# Patient Record
Sex: Female | Born: 1985
Health system: Southern US, Community
[De-identification: ages and names within clinical notes are randomized; demographics above are authoritative.]

## PROBLEM LIST (undated history)

## (undated) ENCOUNTER — Inpatient Hospital Stay (HOSPITAL_COMMUNITY): Payer: Self-pay

## (undated) DIAGNOSIS — G43009 Migraine without aura, not intractable, without status migrainosus: Secondary | ICD-10-CM

## (undated) DIAGNOSIS — Z8489 Family history of other specified conditions: Secondary | ICD-10-CM

## (undated) DIAGNOSIS — F419 Anxiety disorder, unspecified: Secondary | ICD-10-CM

## (undated) DIAGNOSIS — I10 Essential (primary) hypertension: Secondary | ICD-10-CM

## (undated) DIAGNOSIS — K589 Irritable bowel syndrome without diarrhea: Secondary | ICD-10-CM

## (undated) DIAGNOSIS — K219 Gastro-esophageal reflux disease without esophagitis: Secondary | ICD-10-CM

## (undated) DIAGNOSIS — F319 Bipolar disorder, unspecified: Secondary | ICD-10-CM

## (undated) DIAGNOSIS — F32A Depression, unspecified: Secondary | ICD-10-CM

## (undated) HISTORY — DX: Depression, unspecified: F32.A

## (undated) HISTORY — PX: WISDOM TOOTH EXTRACTION: SHX21

## (undated) HISTORY — DX: Gastro-esophageal reflux disease without esophagitis: K21.9

## (undated) HISTORY — DX: Irritable bowel syndrome, unspecified: K58.9

## (undated) HISTORY — DX: Migraine without aura, not intractable, without status migrainosus: G43.009

## (undated) HISTORY — DX: Essential (primary) hypertension: I10

## (undated) HISTORY — PX: ABDOMINAL SURGERY: SHX537

---
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
## 2007-12-11 ENCOUNTER — Emergency Department (HOSPITAL_COMMUNITY): Admission: EM | Admit: 2007-12-11 | Discharge: 2007-12-11 | Payer: Self-pay | Admitting: Emergency Medicine

## 2008-02-22 ENCOUNTER — Emergency Department (HOSPITAL_COMMUNITY): Admission: EM | Admit: 2008-02-22 | Discharge: 2008-02-22 | Payer: Self-pay | Admitting: Emergency Medicine

## 2008-02-28 ENCOUNTER — Ambulatory Visit (HOSPITAL_COMMUNITY): Admission: RE | Admit: 2008-02-28 | Discharge: 2008-02-28 | Payer: Self-pay | Admitting: Family Medicine

## 2008-02-28 IMAGING — US US OB COMP LESS 14 WK
1 series · 14 of 28 positions shown · non-contrast
Comparison: none

CLINICAL DATA: Pelvic pain, positive pregnancy test

OBSTETRIC <14 WK US AND TRANSVAGINAL OB US
TECHNIQUE: Both transabdominal and transvaginal ultrasound
examinations were performed for complete evaluation of the
gestation as well as the maternal uterus, adnexal regions, and
pelvic cul-de-sac.

[Series 1: us ob comp less 14 wk · 0.22mm/px · 14 of 49 slices shown]
[im 2/49]
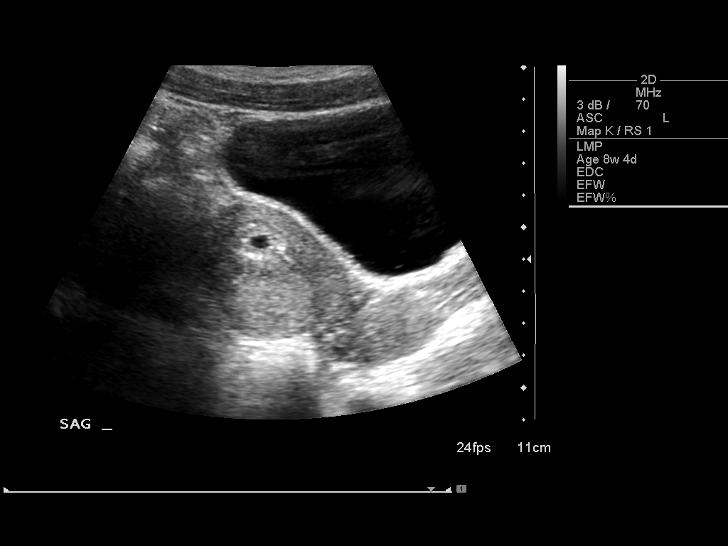
[im 6/49]
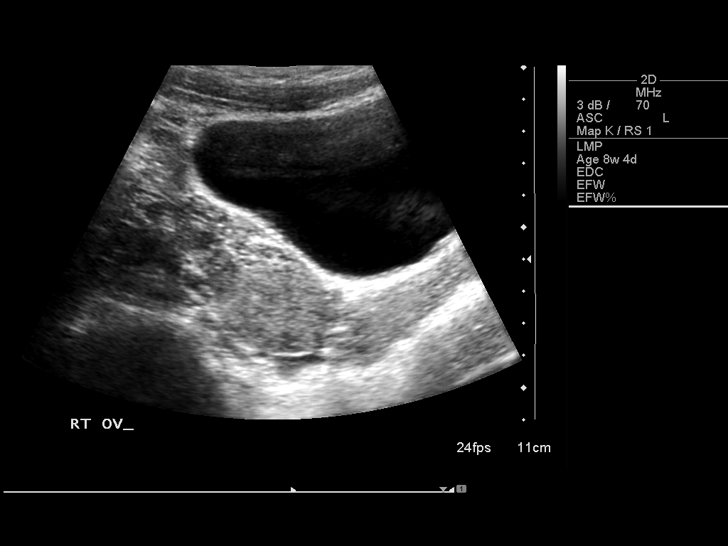
[im 9/49]
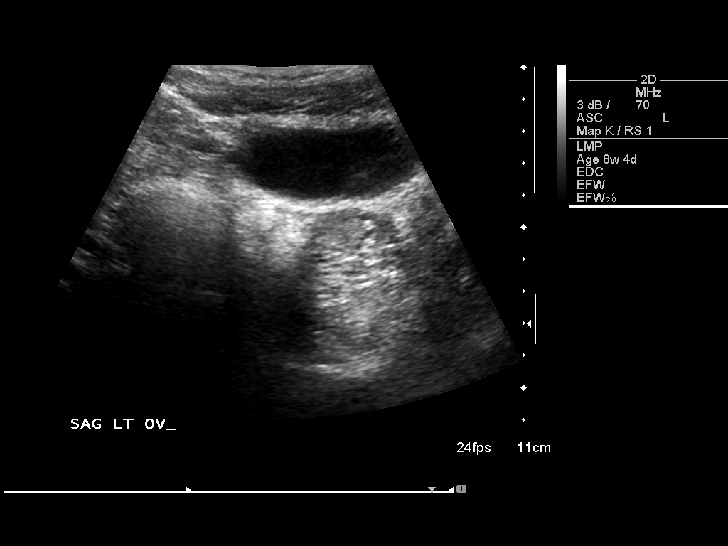
[im 13/49]
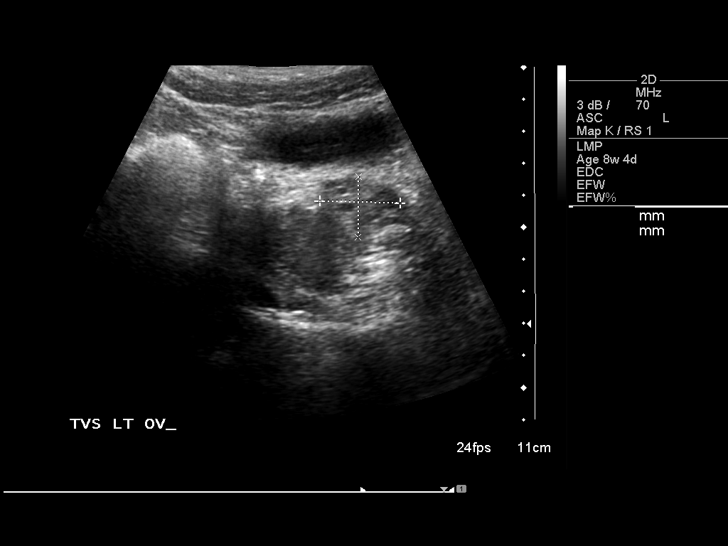
[im 17/49]
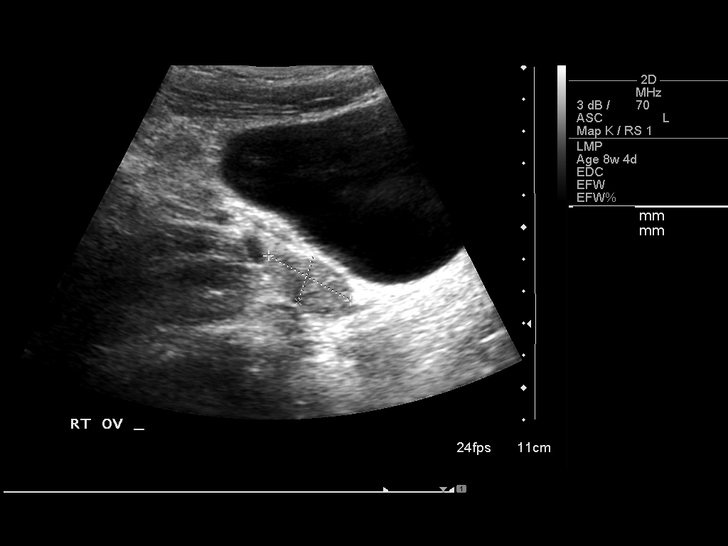
[im 20/49]
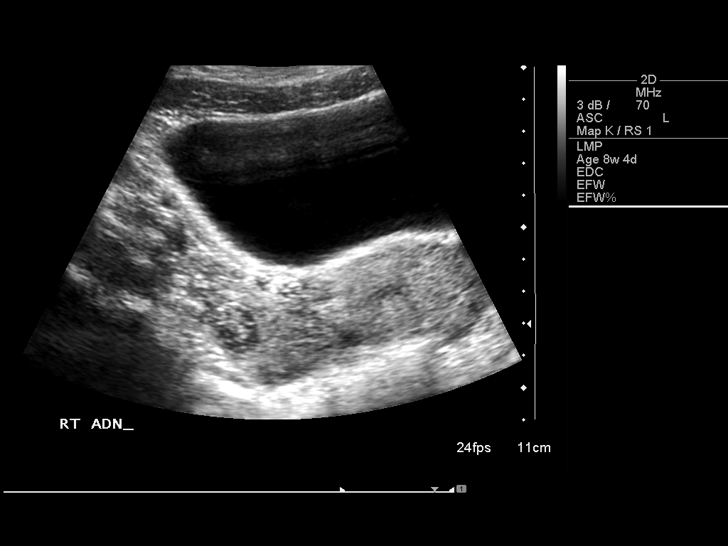
[im 24/49]
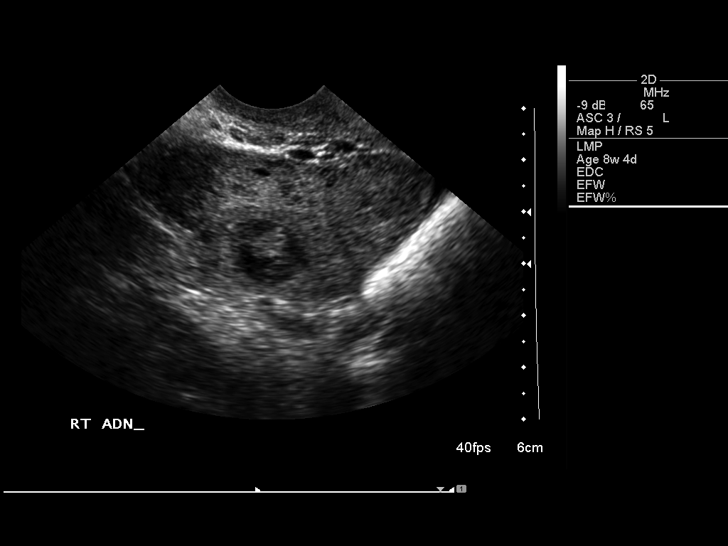
[im 27/49]
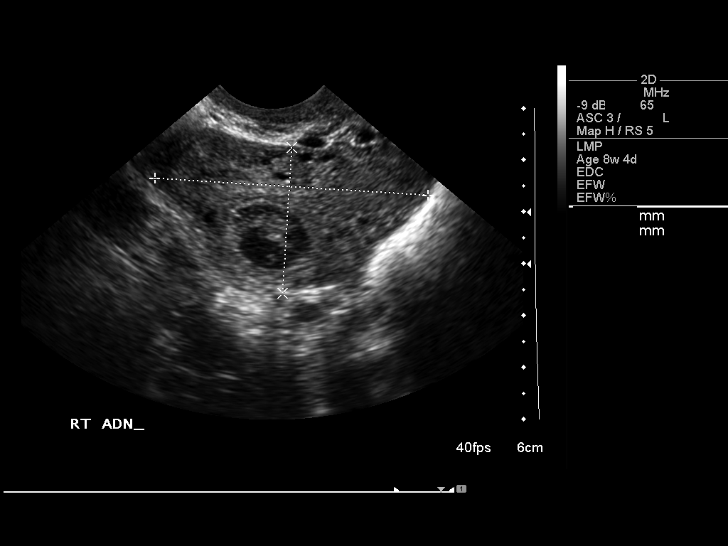
[im 31/49]
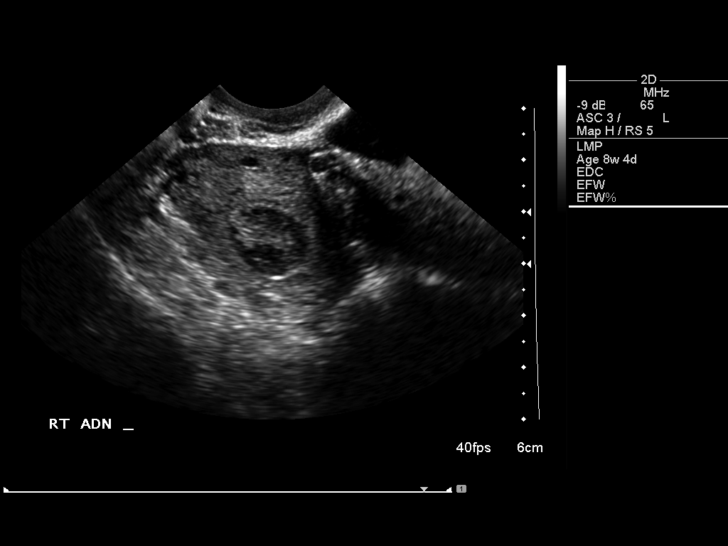
[im 34/49]
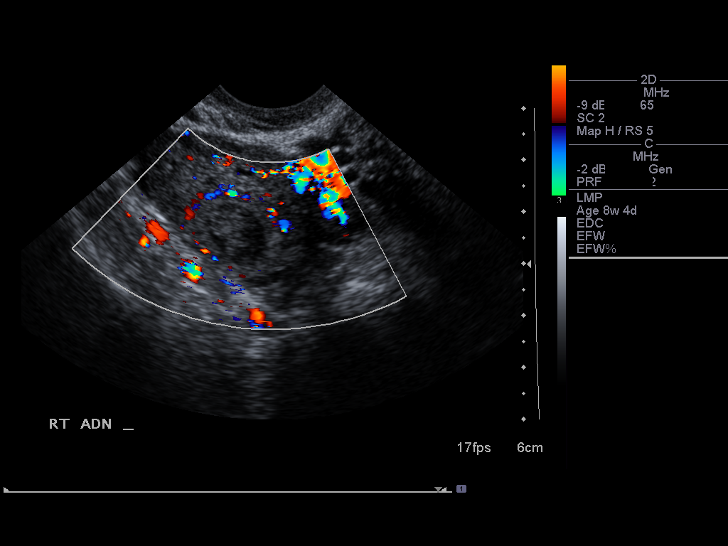
[im 38/49]
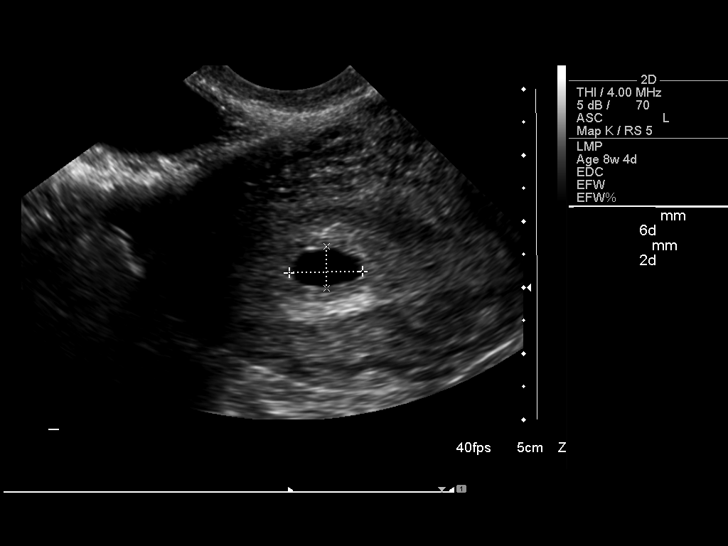
[im 41/49]
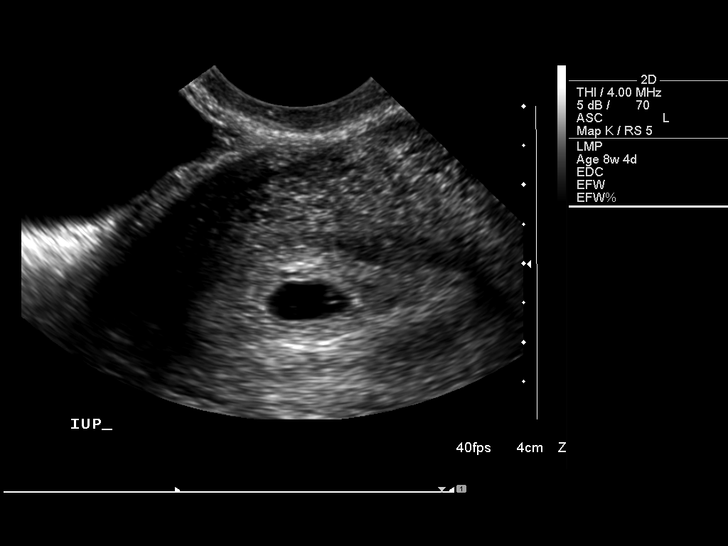
[im 45/49]
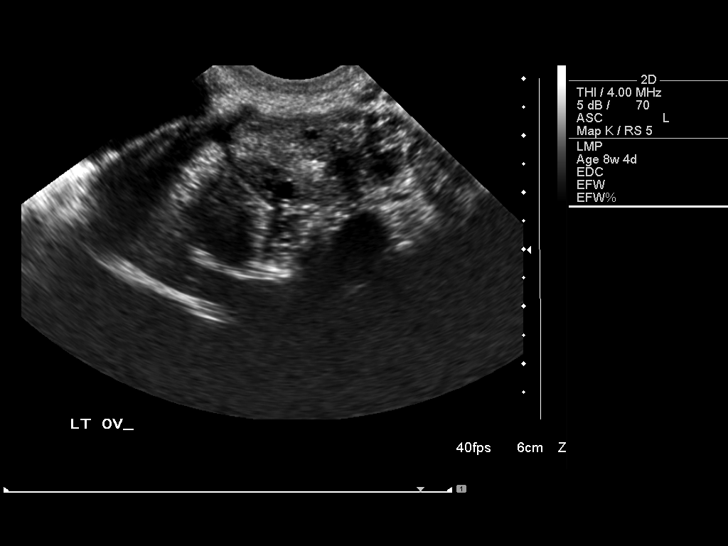
[im 49/49]
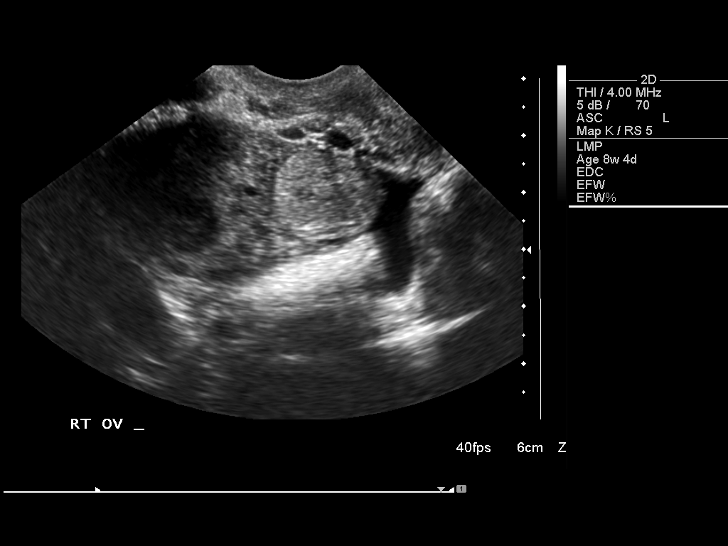

[14 of 28 positions shown; findings below may reference images not displayed]

Well-distended normal-appearing bladder.
Gestational sac within uterus, containing a yolk sac.
No subchorionic hemorrhage.

Intrauterine gestational sac: Present
Yolk sac: Present
Embryo: Not visualized
Cardiac Activity: Not visualized
Heart Rate: n/a bpm

MSD: 9   mm  5    w 4    d
CRL:               w     d          US EDC:

Maternal uterus/adnexae:
Small amount of free fluid in right adnexa.
Left ovary normal size and morphology, 2.0 x 3.2 x 1.7 cm.
Right ovary measures 5.3 x 2.8 x 2.7 cm.
Hemorrhagic corpus luteum right ovary.
No other adnexal masses.
IMPRESSION: Gestational sac within uterus containing a yolk sac.
No evidence of ectopic pregnancy.
Assessment of fetal viability may be made on follow-up ultrasound
in 10-14 days.

## 2008-03-14 ENCOUNTER — Emergency Department (HOSPITAL_COMMUNITY): Admission: EM | Admit: 2008-03-14 | Discharge: 2008-03-15 | Payer: Self-pay | Admitting: Emergency Medicine

## 2008-09-28 DIAGNOSIS — O1490 Unspecified pre-eclampsia, unspecified trimester: Secondary | ICD-10-CM

## 2008-12-30 ENCOUNTER — Emergency Department (HOSPITAL_COMMUNITY): Admission: EM | Admit: 2008-12-30 | Discharge: 2008-12-30 | Payer: Self-pay | Admitting: Emergency Medicine

## 2008-12-30 IMAGING — CR DG CHEST 2V
2 series · 2 of 2 positions shown · non-contrast
Comparison: None available.

CLINICAL DATA: Chest pain.

CHEST - 2 VIEW

[view not recorded (1 of 2)]
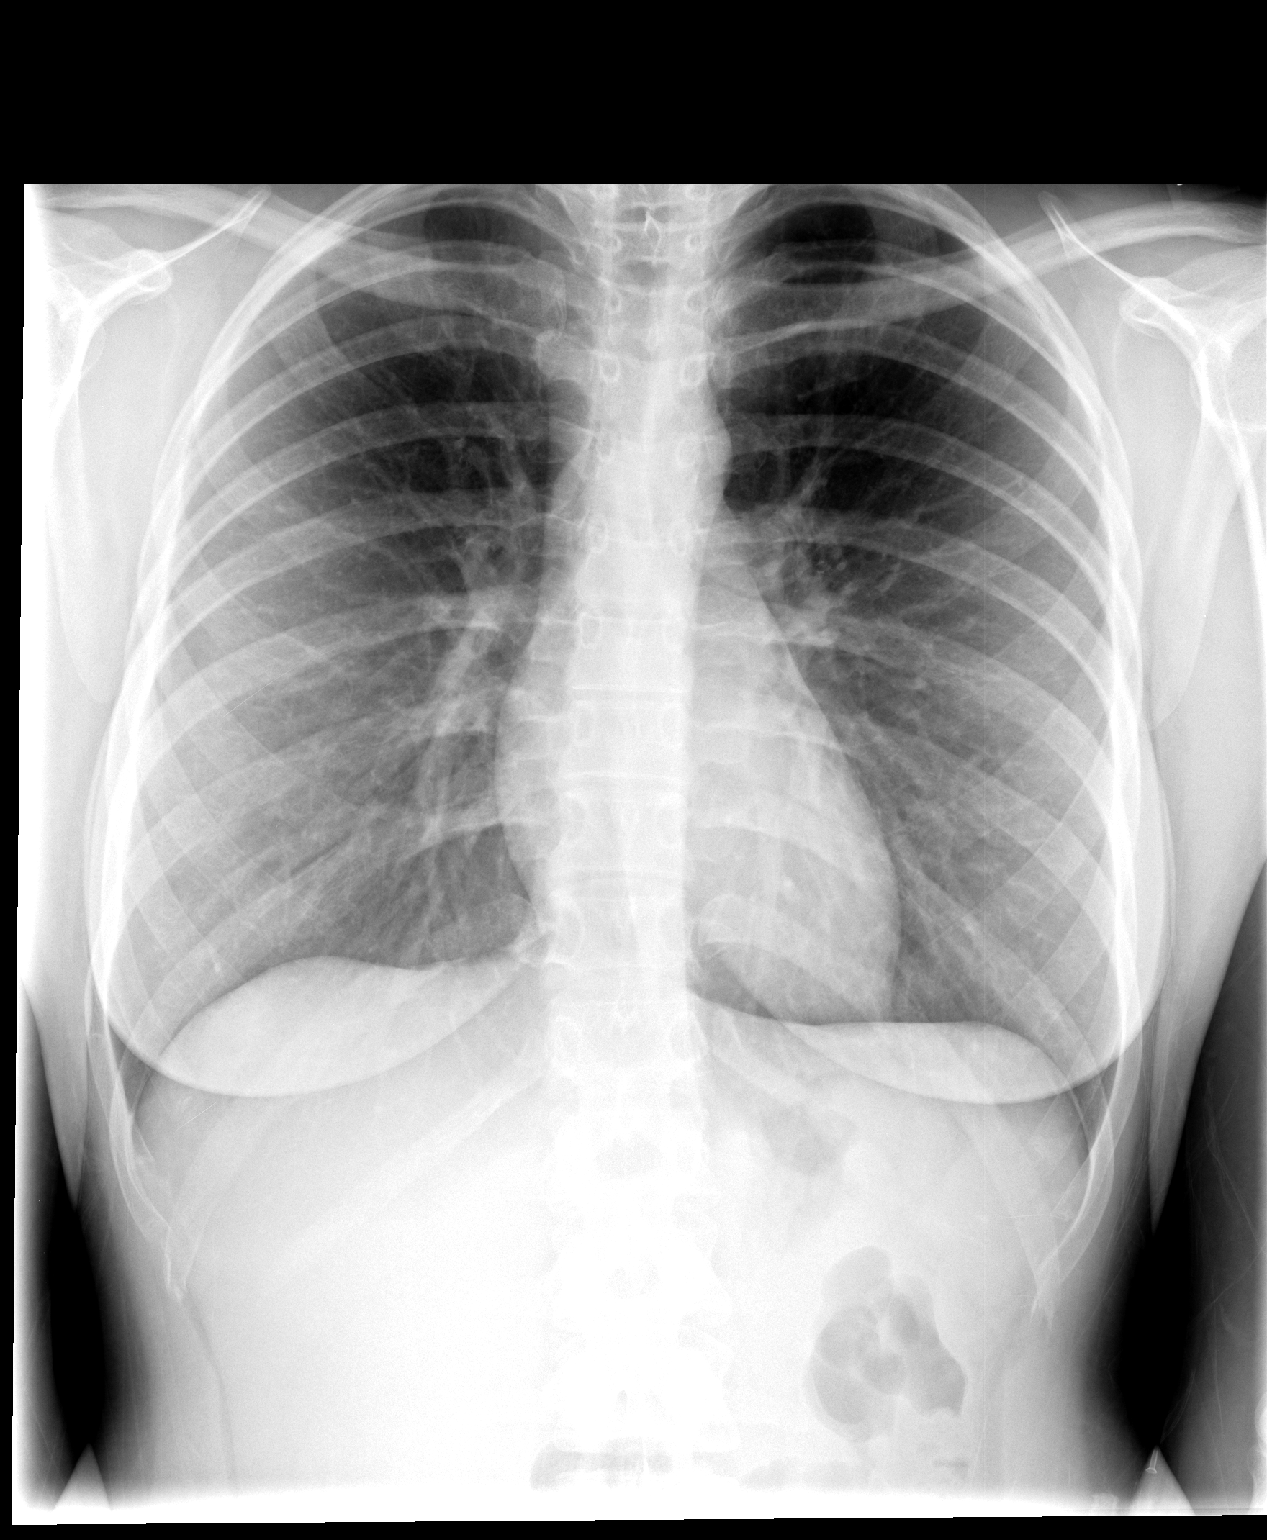

[view not recorded (2 of 2)]
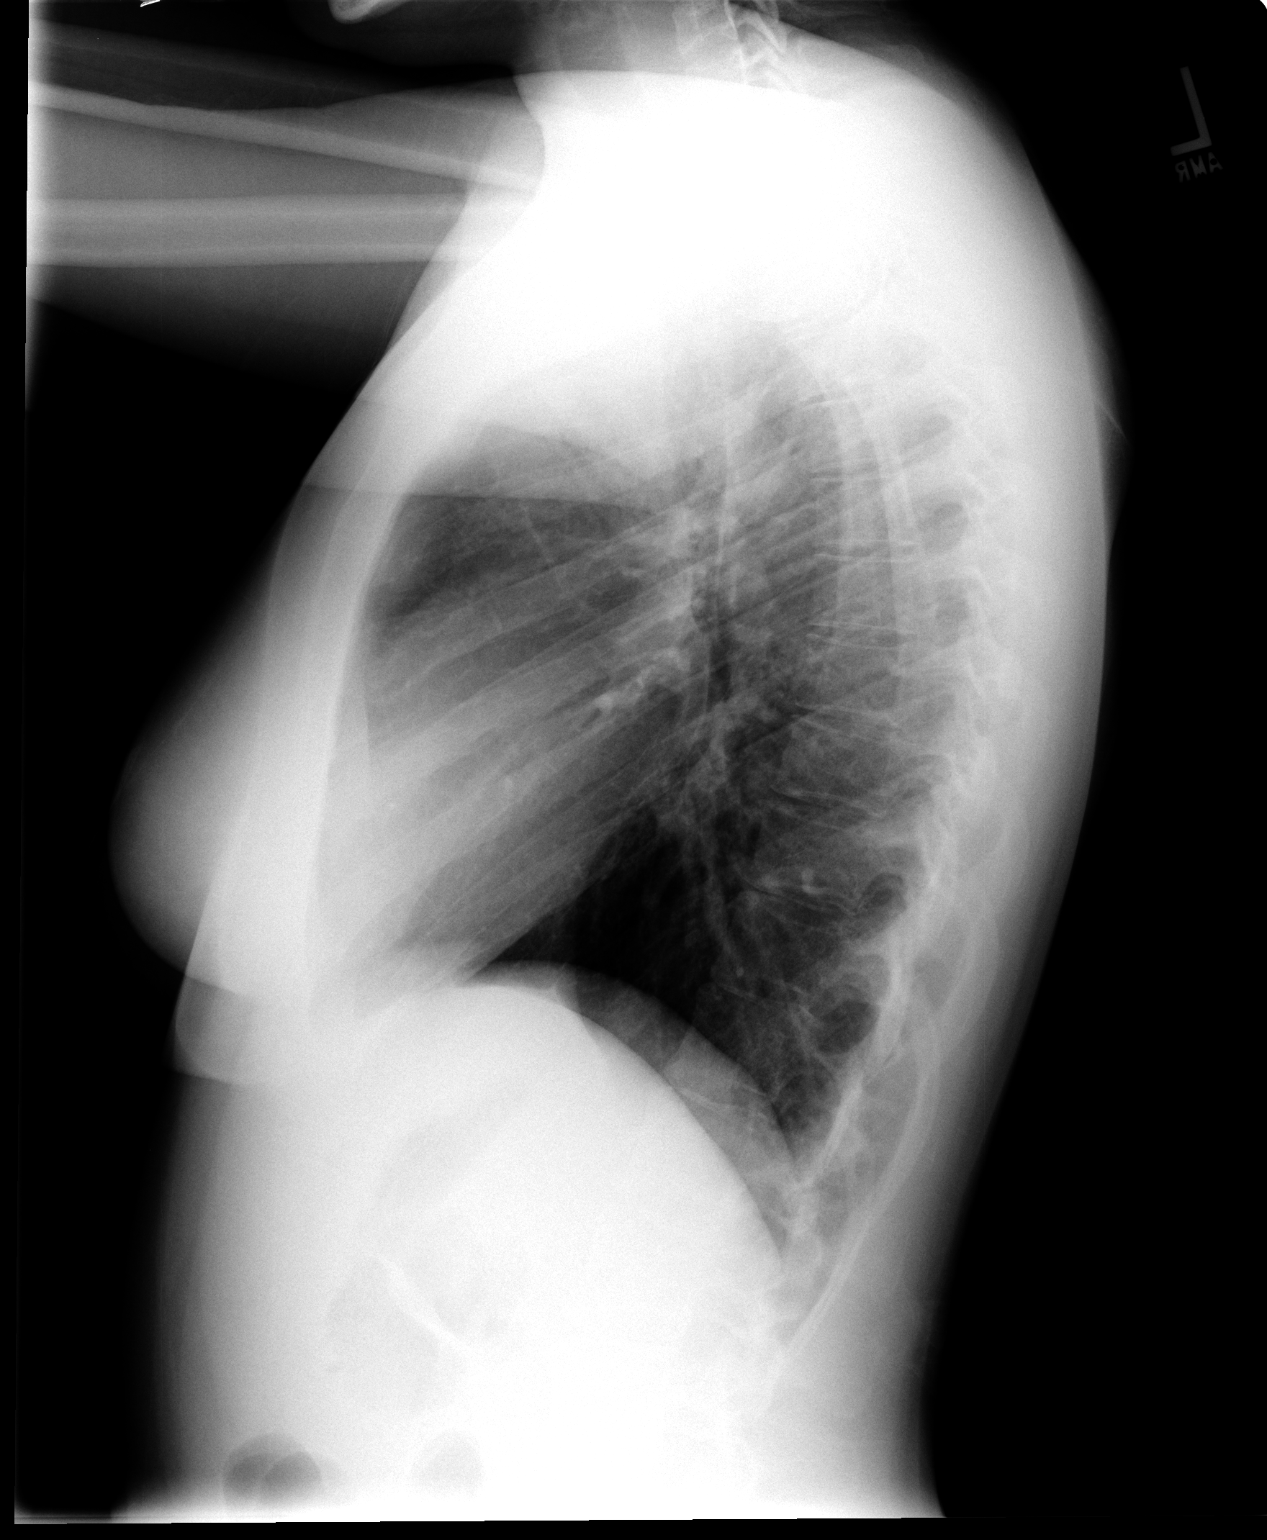

[2 of 2 positions shown; findings below may reference images not displayed]

FINDINGS: Lungs clear.  No pneumothorax or pleural effusion.  Heart
size normal.
IMPRESSION: Normal chest.

## 2011-03-01 LAB — BASIC METABOLIC PANEL
BUN: 10 mg/dL (ref 6–23)
CO2: 25 mEq/L (ref 19–32)
Calcium: 9.3 mg/dL (ref 8.4–10.5)
Chloride: 106 mEq/L (ref 96–112)
Creatinine, Ser: 0.63 mg/dL (ref 0.4–1.2)
GFR calc Af Amer: >60 mL/min (ref >60)
GFR calc non Af Amer: >60 mL/min (ref >60)
Glucose, Bld: 93 mg/dL (ref 70–99)
Potassium: 4 mEq/L (ref 3.5–5.1)
Sodium: 139 mEq/L (ref 135–145)

## 2011-03-01 LAB — DIFFERENTIAL
Basophils Absolute: 0 10*3/uL (ref 0.0–0.1)
Basophils Relative: 1 % (ref 0–1)
Eosinophils Absolute: 0.2 10*3/uL (ref 0.0–0.7)
Eosinophils Relative: 2 % (ref 0–5)
Lymphocytes Relative: 35 % (ref 12–46)
Lymphs Abs: 2.3 10*3/uL (ref 0.7–4.0)
Monocytes Absolute: 0.6 10*3/uL (ref 0.1–1.0)
Monocytes Relative: 8 % (ref 3–12)
Neutro Abs: 3.6 10*3/uL (ref 1.7–7.7)
Neutrophils Relative %: 54 % (ref 43–77)

## 2011-03-01 LAB — CBC
HCT: 38.7 % (ref 36.0–46.0)
Hemoglobin: 13.3 g/dL (ref 12.0–15.0)
MCHC: 34.4 g/dL (ref 30.0–36.0)
MCV: 88 fL (ref 78.0–100.0)
Platelets: 242 10*3/uL (ref 150–400)
RBC: 4.4 MIL/uL (ref 3.87–5.11)
RDW: 12.8 % (ref 11.5–15.5)
WBC: 6.7 10*3/uL (ref 4.0–10.5)

## 2011-03-01 LAB — D-DIMER, QUANTITATIVE: D-Dimer, Quant: 0.22 ug/mL-FEU (ref 0.00–0.48)

## 2011-03-01 LAB — POCT CARDIAC MARKERS
CKMB, poc: <1 ng/mL — ABNORMAL LOW (ref 1.0–8.0)
Myoglobin, poc: 46.4 ng/mL (ref 12–200)
Troponin i, poc: <0.05 ng/mL (ref 0.00–0.09)

## 2011-08-04 LAB — BASIC METABOLIC PANEL
BUN: 7
CO2: 25
Calcium: 9.3
Chloride: 109
Creatinine, Ser: 0.64
GFR calc Af Amer: >60
GFR calc non Af Amer: >60
Glucose, Bld: 92
Potassium: 4
Sodium: 143

## 2011-08-04 LAB — URINALYSIS, ROUTINE W REFLEX MICROSCOPIC
Bilirubin Urine: NEGATIVE
Glucose, UA: NEGATIVE
Hgb urine dipstick: NEGATIVE
Ketones, ur: NEGATIVE
Nitrite: NEGATIVE
Protein, ur: NEGATIVE
Specific Gravity, Urine: 1.025
Urobilinogen, UA: 0.2
pH: 5.5

## 2011-08-04 LAB — DIFFERENTIAL
Basophils Absolute: 0
Basophils Relative: 1
Eosinophils Absolute: 0.2
Eosinophils Relative: 2
Lymphocytes Relative: 25
Lymphs Abs: 2.1
Monocytes Absolute: 0.7
Monocytes Relative: 8
Neutro Abs: 5.5
Neutrophils Relative %: 65

## 2011-08-04 LAB — CBC
HCT: 39.1
Hemoglobin: 13.8
MCHC: 35.2
MCV: 88.2
Platelets: 290
RBC: 4.43
RDW: 12.6
WBC: 8.6

## 2011-08-04 LAB — HEPATIC FUNCTION PANEL
ALT: 15
AST: 18
Albumin: 4.2
Alkaline Phosphatase: 70
Bilirubin, Direct: 0.1
Indirect Bilirubin: 0.6
Total Bilirubin: 0.7
Total Protein: 6.8

## 2011-08-04 LAB — PREGNANCY, URINE: Preg Test, Ur: NEGATIVE

## 2011-10-01 ENCOUNTER — Emergency Department (HOSPITAL_COMMUNITY)
Admission: EM | Admit: 2011-10-01 | Discharge: 2011-10-01 | Disposition: A | Discharge status: Home / Self Care | Payer: 59 | Attending: Emergency Medicine | Admitting: Emergency Medicine

## 2011-10-01 DIAGNOSIS — N730 Acute parametritis and pelvic cellulitis: Secondary | ICD-10-CM

## 2011-10-01 DIAGNOSIS — F17200 Nicotine dependence, unspecified, uncomplicated: Secondary | ICD-10-CM | POA: Insufficient documentation

## 2011-10-01 DIAGNOSIS — F411 Generalized anxiety disorder: Secondary | ICD-10-CM | POA: Insufficient documentation

## 2011-10-01 HISTORY — DX: Anxiety disorder, unspecified: F41.9

## 2011-10-01 LAB — WET PREP, GENITAL
Trich, Wet Prep: NONE SEEN
Yeast Wet Prep HPF POC: NONE SEEN

## 2011-10-01 LAB — URINALYSIS, ROUTINE W REFLEX MICROSCOPIC
Bilirubin Urine: NEGATIVE
Glucose, UA: NEGATIVE mg/dL
Hgb urine dipstick: NEGATIVE
Ketones, ur: NEGATIVE mg/dL
Leukocytes, UA: NEGATIVE
Nitrite: NEGATIVE
Protein, ur: NEGATIVE mg/dL
Specific Gravity, Urine: 1.02 (ref 1.005–1.030)
Urobilinogen, UA: 0.2 mg/dL (ref 0.0–1.0)
pH: 8.5 — ABNORMAL HIGH (ref 5.0–8.0)

## 2011-10-01 LAB — BASIC METABOLIC PANEL
BUN: 9 mg/dL (ref 6–23)
CO2: 28 mEq/L (ref 19–32)
Calcium: 9.5 mg/dL (ref 8.4–10.5)
Chloride: 102 mEq/L (ref 96–112)
Creatinine, Ser: 0.58 mg/dL (ref 0.50–1.10)
GFR calc Af Amer: >90 mL/min (ref >90)
GFR calc non Af Amer: >90 mL/min (ref >90)
Glucose, Bld: 89 mg/dL (ref 70–99)
Potassium: 4 mEq/L (ref 3.5–5.1)
Sodium: 136 mEq/L (ref 135–145)

## 2011-10-01 LAB — CBC
HCT: 38.3 % (ref 36.0–46.0)
Hemoglobin: 12.8 g/dL (ref 12.0–15.0)
MCH: 30.5 pg (ref 26.0–34.0)
MCHC: 33.4 g/dL (ref 30.0–36.0)
MCV: 91.4 fL (ref 78.0–100.0)
Platelets: 192 10*3/uL (ref 150–400)
RBC: 4.19 MIL/uL (ref 3.87–5.11)
RDW: 13.4 % (ref 11.5–15.5)
WBC: 13.4 10*3/uL — ABNORMAL HIGH (ref 4.0–10.5)

## 2011-10-01 LAB — POCT PREGNANCY, URINE: Preg Test, Ur: NEGATIVE

## 2011-10-01 LAB — RPR: RPR Ser Ql: NONREACTIVE

## 2011-10-01 LAB — OCCULT BLOOD, POC DEVICE: Fecal Occult Bld: NEGATIVE

## 2011-10-01 MED ORDER — METRONIDAZOLE 500 MG PO TABS: ORAL_TABLET | ORAL | Status: DC

## 2011-10-01 MED ORDER — MORPHINE SULFATE 4 MG/ML IJ SOLN
4.0000 mg | Freq: Once | INTRAMUSCULAR | Status: AC
  Administered 2011-10-01: 4 mg via INTRAVENOUS
  Filled 2011-10-01: qty 1

## 2011-10-01 MED ORDER — AZITHROMYCIN 250 MG PO TABS
1000.0000 mg | ORAL_TABLET | Freq: Once | ORAL | Status: AC
  Administered 2011-10-01: 1000 mg via ORAL
  Filled 2011-10-01: qty 4

## 2011-10-01 MED ORDER — DEXTROSE 5 % IV SOLN
1.0000 g | Freq: Once | INTRAVENOUS | Status: AC
  Administered 2011-10-01: 1 g via INTRAVENOUS
  Filled 2011-10-01: qty 10

## 2011-10-01 MED ORDER — PROMETHAZINE HCL 25 MG PO TABS: 25.0000 mg | ORAL_TABLET | Freq: Four times a day (QID) | ORAL | Status: DC | PRN

## 2011-10-01 MED ORDER — ONDANSETRON HCL 4 MG/2ML IJ SOLN
4.0000 mg | Freq: Once | INTRAMUSCULAR | Status: AC
  Administered 2011-10-01: 4 mg via INTRAVENOUS
  Filled 2011-10-01: qty 2

## 2011-10-01 MED ORDER — HYDROCODONE-ACETAMINOPHEN 5-325 MG PO TABS: 1.0000 | ORAL_TABLET | ORAL | Status: AC | PRN

## 2011-10-01 NOTE — ED Notes (Signed)
Patient with no complaints at this time. Respirations even and unlabored. Skin warm/dry. Discharge instructions reviewed with patient at this time. Patient given opportunity to voice concerns/ask questions. IV removed per policy and band-aid applied to site. Patient discharged at this time and left Emergency Department with steady gait.  

## 2011-10-01 NOTE — ED Notes (Signed)
Pt reports 2 days ago noticed some bloody vaginal discharge.  Last night c/o lower abd pain and abd swelling.  LMP was 2 weeks ago and was normal per pt.  Denies any pain with urination.

## 2011-10-01 NOTE — ED Provider Notes (Signed)
History     CSN: [id number] Arrival date & time: 10/01/2011  9:43 AM   None     Chief Complaint  Patient presents with  . Pelvic Pain    (Consider location/radiation/quality/duration/timing/severity/associated sxs/prior treatment) Patient is a 25 y.o. female presenting with vaginal discharge. The history is provided by the patient.  Vaginal Discharge This is a new problem. The current episode started in the past 7 days. The problem occurs rarely. The problem has been unchanged. Associated symptoms include abdominal pain. Pertinent negatives include no anorexia, arthralgias, chest pain, chills, coughing, nausea, neck pain, urinary symptoms or vomiting. The symptoms are aggravated by nothing. She has tried nothing for the symptoms. The treatment provided no relief.    Past Medical History  Diagnosis Date  . Anxiety     Past Surgical History  Procedure Date  . Abdominal surgery     No family history on file.  History  Substance Use Topics  . Smoking status: Current Everyday Smoker  . Smokeless tobacco: Not on file  . Alcohol Use: Yes     occasionally    OB History    Grav Para Term Preterm Abortions TAB SAB Ect Mult Living                  Review of Systems  Constitutional: Negative for chills and activity change.       All ROS Neg except as noted in HPI  HENT: Negative for nosebleeds and neck pain.   Eyes: Negative for photophobia and discharge.  Respiratory: Negative for cough, shortness of breath and wheezing.   Cardiovascular: Negative for chest pain and palpitations.  Gastrointestinal: Positive for abdominal pain. Negative for nausea, vomiting, blood in stool and anorexia.  Genitourinary: Positive for vaginal discharge. Negative for dysuria, frequency and hematuria.  Musculoskeletal: Negative for back pain and arthralgias.  Skin: Negative.   Neurological: Negative for dizziness, seizures and speech difficulty.  Psychiatric/Behavioral: Negative for  hallucinations and confusion.    Allergies  Klonopin and Lexapro  Home Medications  No current outpatient prescriptions on file.  BP 109/65  Pulse 112  Temp(Src) 98.3 F (36.8 C) (Oral)  Resp 20  Ht 5\' 1"  (1.549 m)  Wt 122 lb (55.339 kg)  BMI 23.05 kg/m2  SpO2 100%  LMP 09/17/2011  Physical Exam  Nursing note and vitals reviewed. Constitutional: She is oriented to person, place, and time. She appears well-developed and well-nourished.  Non-toxic appearance.  HENT:  Head: Normocephalic.  Right Ear: Tympanic membrane and external ear normal.  Left Ear: Tympanic membrane and external ear normal.  Eyes: EOM and lids are normal. Pupils are equal, round, and reactive to light.  Neck: Normal range of motion. Neck supple. Carotid bruit is not present.  Cardiovascular: Regular rhythm, normal heart sounds, intact distal pulses and normal pulses.  Tachycardia present.   Pulmonary/Chest: Breath sounds normal. No respiratory distress.  Abdominal: Soft. Bowel sounds are normal. There is tenderness.         Right and left lower quad abd pain. Mild pain with walking. Increase pain with change of positions.  Genitourinary: Vaginal discharge found.       External genitalia wnl. No mass or fb in the vaginal vault. Mild blood tinged mucus in the vaginal vault.  Mod wall motion tenderness noted. No adnexal mass. Chaperone present during procedure.  Musculoskeletal: Normal range of motion.  Lymphadenopathy:       Head (right side): No submandibular adenopathy present.  Head (left side): No submandibular adenopathy present.    She has no cervical adenopathy.  Neurological: She is alert and oriented to person, place, and time. She has normal strength. No cranial nerve deficit or sensory deficit.  Skin: Skin is warm and dry.  Psychiatric: She has a normal mood and affect. Her speech is normal.    ED Course  Procedures (including critical care time)   Labs Reviewed  URINALYSIS, ROUTINE  W REFLEX MICROSCOPIC  POCT PREGNANCY, URINE  CBC  BASIC METABOLIC PANEL   No results found.   No diagnosis found.    MDM  I have reviewed nursing notes, vital signs, and all appropriate lab and imaging results for this patient.  I have reviewed nursing notes, vital signs, and all appropriate lab and imaging results for this patient. Results for orders placed during the hospital encounter of 10/01/11  URINALYSIS, ROUTINE W REFLEX MICROSCOPIC      Component Value Range   Color, Urine YELLOW  YELLOW    APPearance CLEAR  CLEAR    Specific Gravity, Urine 1.020  1.005 - 1.030    pH 8.5 (*) 5.0 - 8.0    Glucose, UA NEGATIVE  NEGATIVE (mg/dL)   Hgb urine dipstick NEGATIVE  NEGATIVE    Bilirubin Urine NEGATIVE  NEGATIVE    Ketones, ur NEGATIVE  NEGATIVE (mg/dL)   Protein, ur NEGATIVE  NEGATIVE (mg/dL)   Urobilinogen, UA 0.2  0.0 - 1.0 (mg/dL)   Nitrite NEGATIVE  NEGATIVE    Leukocytes, UA NEGATIVE  NEGATIVE   CBC      Component Value Range   WBC 13.4 (*) 4.0 - 10.5 (K/uL)   RBC 4.19  3.87 - 5.11 (MIL/uL)   Hemoglobin 12.8  12.0 - 15.0 (g/dL)   HCT 91.4  78.2 - 95.6 (%)   MCV 91.4  78.0 - 100.0 (fL)   MCH 30.5  26.0 - 34.0 (pg)   MCHC 33.4  30.0 - 36.0 (g/dL)   RDW 21.3  08.6 - 57.8 (%)   Platelets 192  150 - 400 (K/uL)  BASIC METABOLIC PANEL      Component Value Range   Sodium 136  135 - 145 (mEq/L)   Potassium 4.0  3.5 - 5.1 (mEq/L)   Chloride 102  96 - 112 (mEq/L)   CO2 28  19 - 32 (mEq/L)   Glucose, Bld 89  70 - 99 (mg/dL)   BUN 9  6 - 23 (mg/dL)   Creatinine, Ser 4.69  0.50 - 1.10 (mg/dL)   Calcium 9.5  8.4 - 62.9 (mg/dL)   GFR calc non Af Amer >90  >90 (mL/min)   GFR calc Af Amer >90  >90 (mL/min)  POCT PREGNANCY, URINE      Component Value Range   Preg Test, Ur NEGATIVE    WET PREP, GENITAL      Component Value Range   Yeast, Wet Prep NONE SEEN  NONE SEEN    Trich, Wet Prep NONE SEEN  NONE SEEN    Clue Cells, Wet Prep MANY (*) NONE SEEN    WBC, Wet Prep HPF  POC MANY (*) NONE SEEN   GC/CHLAMYDIA PROBE AMP, GENITAL      Component Value Range   GC Probe Amp, Genital NEGATIVE  NEGATIVE    Chlamydia, DNA Probe NEGATIVE  NEGATIVE   RPR      Component Value Range   RPR NON REACTIVE  NON REACTIVE   OCCULT BLOOD, POC DEVICE  Component Value Range   Fecal Occult Bld NEGATIVE     No results found.      Kathie Dike, [place] 10/14/11 [id number]

## 2011-10-03 LAB — GC/CHLAMYDIA PROBE AMP, GENITAL
Chlamydia, DNA Probe: NEGATIVE
GC Probe Amp, Genital: NEGATIVE

## 2011-10-15 NOTE — ED Provider Notes (Signed)
Medical screening examination/treatment/procedure(s) were performed by non-physician practitioner and as supervising physician I was immediately available for consultation/collaboration.  Shelda Jakes, MD 10/15/11 [phone]

## 2011-12-23 ENCOUNTER — Ambulatory Visit (HOSPITAL_COMMUNITY)
Admission: RE | Admit: 2011-12-23 | Discharge: 2011-12-23 | Disposition: A | Discharge status: Home / Self Care | Payer: 59 | Source: Ambulatory Visit | Attending: Family Medicine | Admitting: Family Medicine

## 2011-12-23 ENCOUNTER — Other Ambulatory Visit: Payer: Self-pay | Admitting: Family Medicine

## 2011-12-23 DIAGNOSIS — IMO0002 Reserved for concepts with insufficient information to code with codable children: Secondary | ICD-10-CM | POA: Insufficient documentation

## 2011-12-23 DIAGNOSIS — R51 Headache: Secondary | ICD-10-CM | POA: Insufficient documentation

## 2011-12-23 DIAGNOSIS — S060X0A Concussion without loss of consciousness, initial encounter: Secondary | ICD-10-CM | POA: Insufficient documentation

## 2011-12-23 IMAGING — CT CT HEAD W/O CM
1 series · 16 of 30 positions shown, 20 images · non-contrast
Comparison: None

CLINICAL DATA: Fell 3 weeks ago and struck left supraorbital
region, headaches

CT HEAD WITHOUT CONTRAST
TECHNIQUE: Contiguous axial images were obtained from the base of
the skull through the vertex without contrast.

[Series 2: headtrauma 4.8 h37s · axial · 0.43mm/px · z∈[+82,+214]mm · 16 of 30 slices shown, 20 images]
[im 2/30  brain]
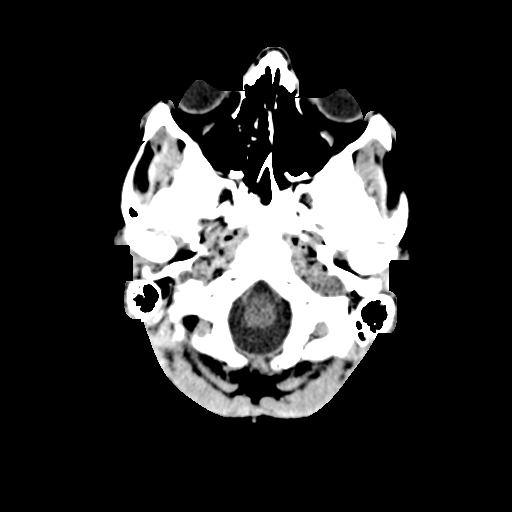
[im 2/30  bone]
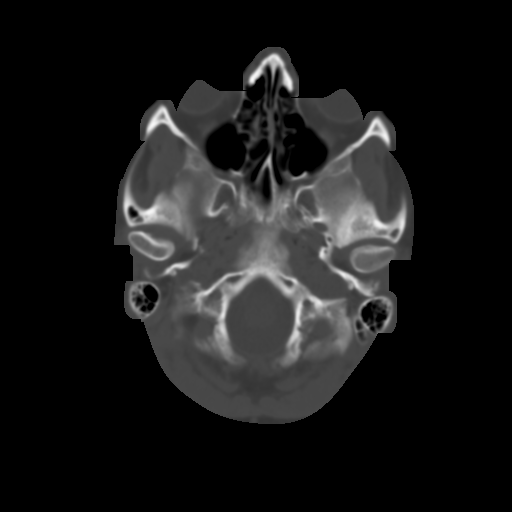
[im 4/30  brain]
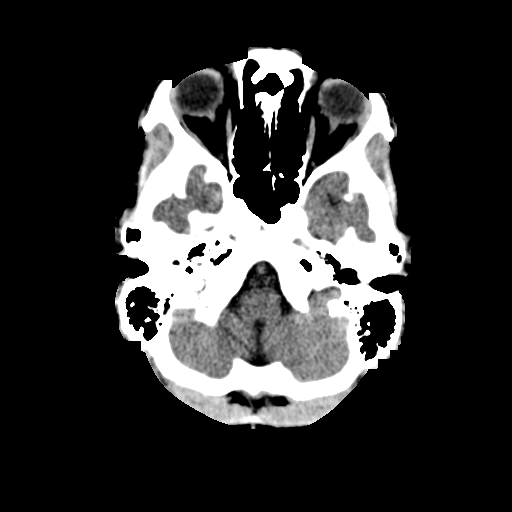
[im 6/30  brain]
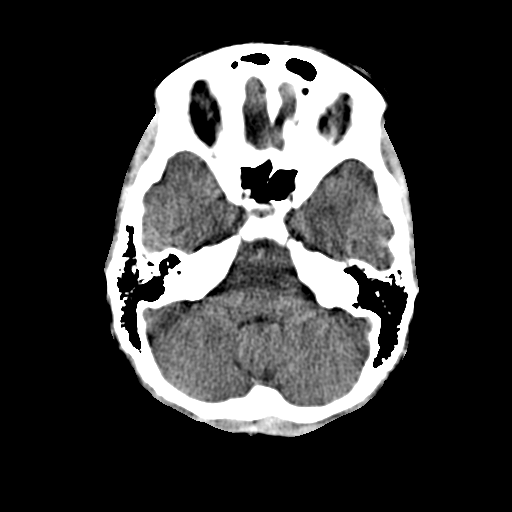
[im 8/30  brain]
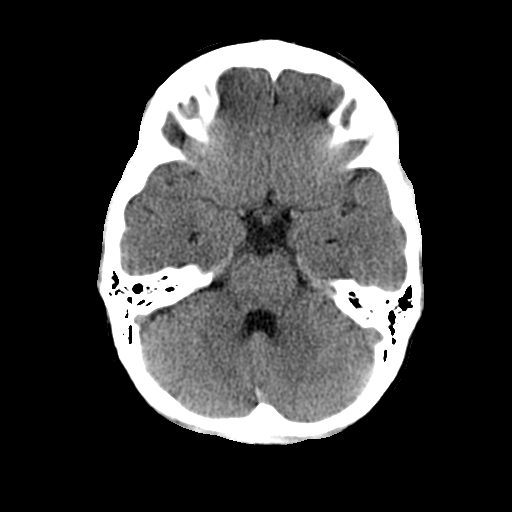
[im 9/30  brain]
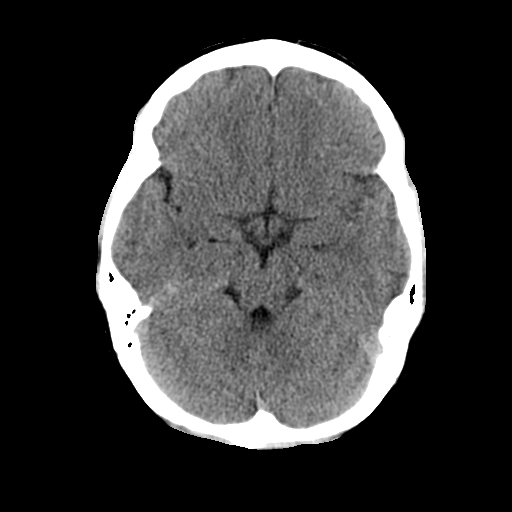
[im 9/30  bone]
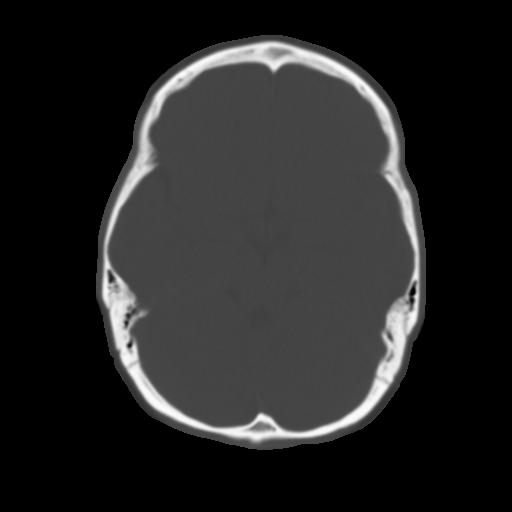
[im 11/30  brain]
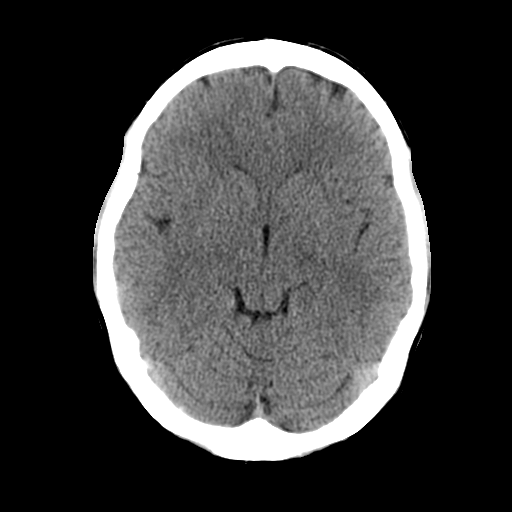
[im 13/30  brain]
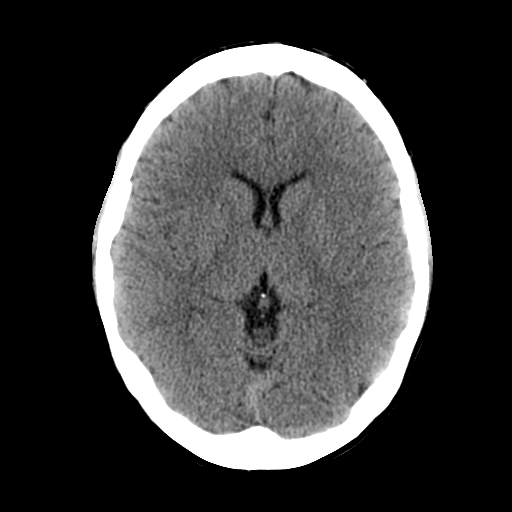
[im 15/30  brain]
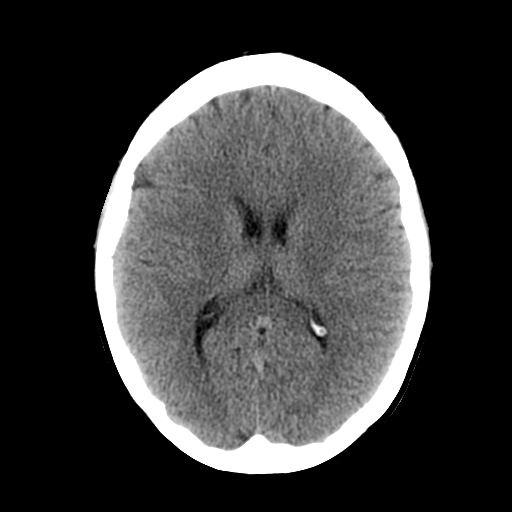
[im 16/30  brain]
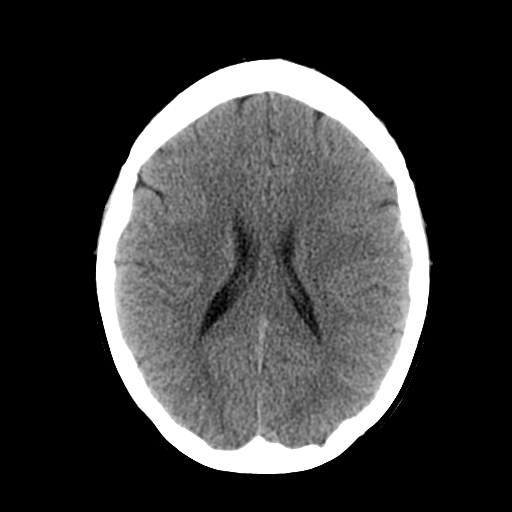
[im 16/30  bone]
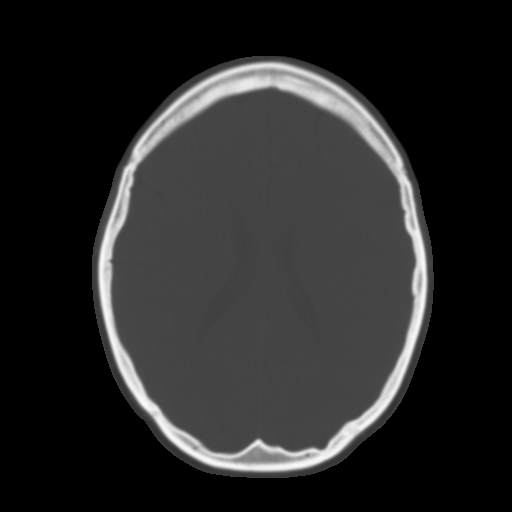
[im 18/30  brain]
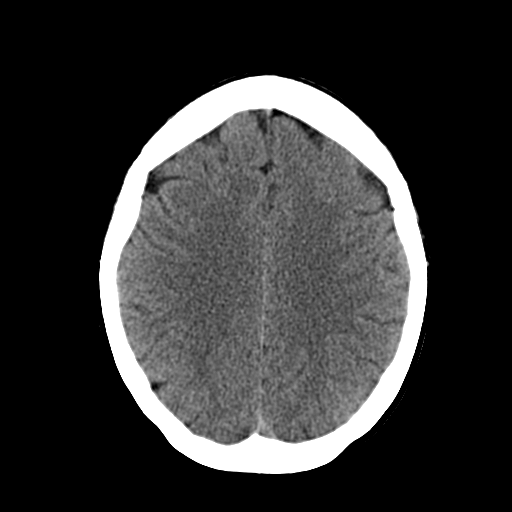
[im 20/30  brain]
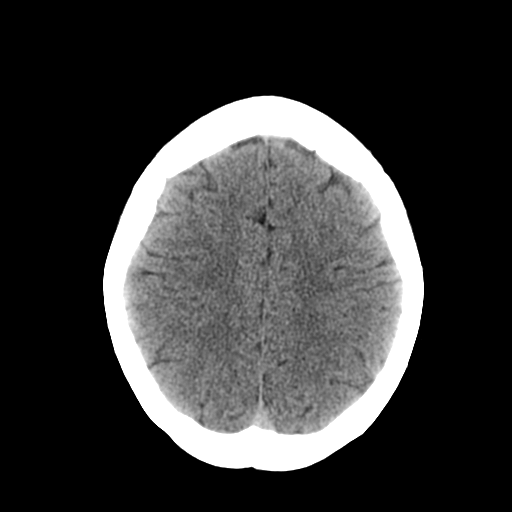
[im 22/30  brain]
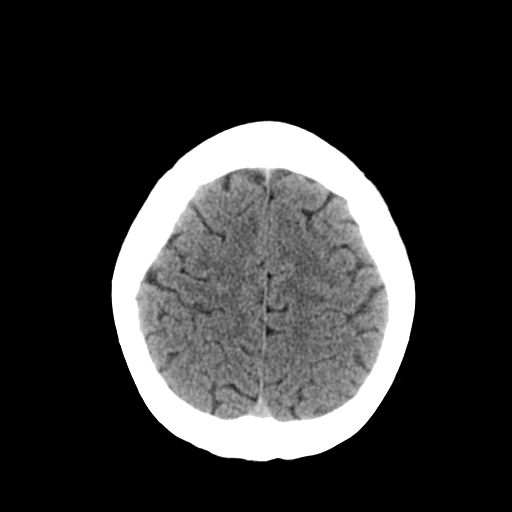
[im 23/30  brain]
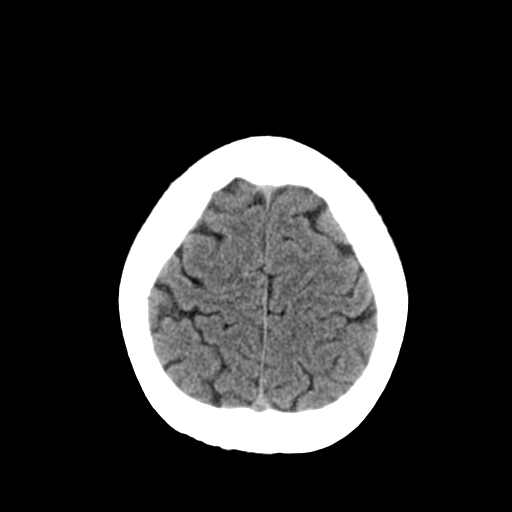
[im 23/30  bone]
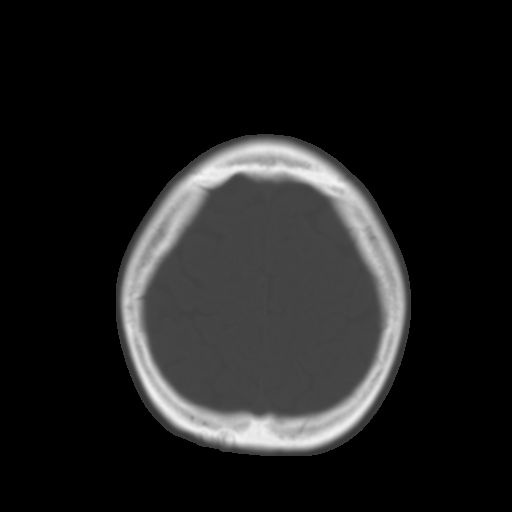
[im 25/30  brain]
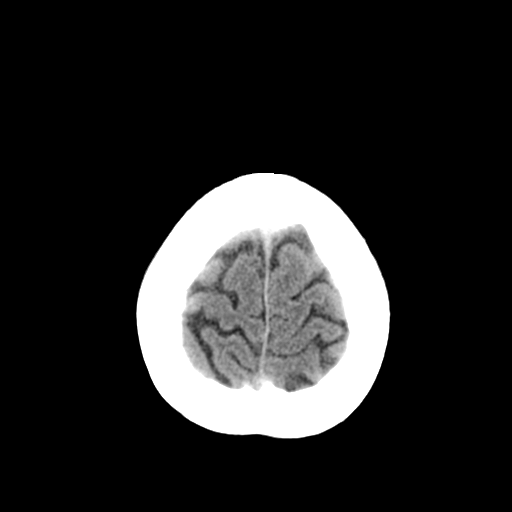
[im 27/30  brain]
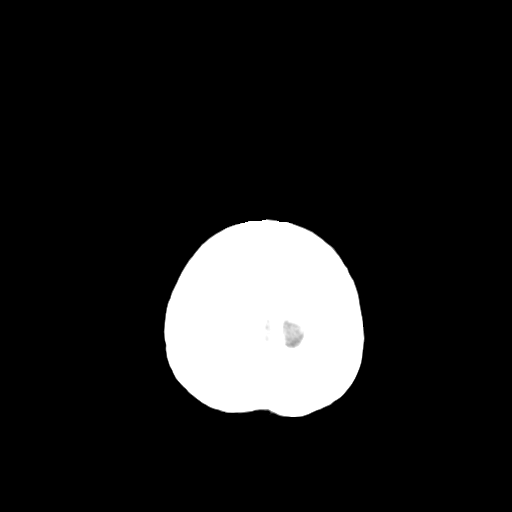
[im 29/30  brain]
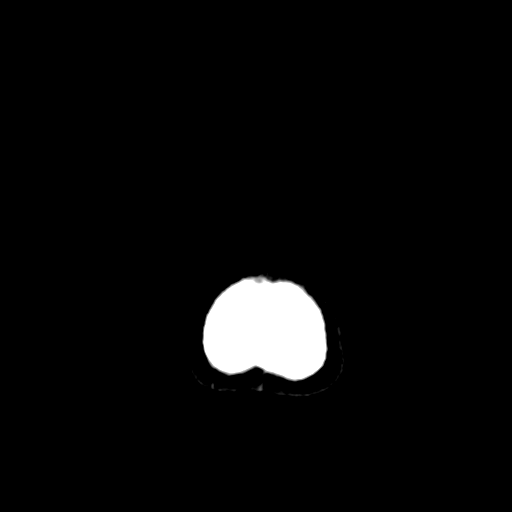

[16 of 30 positions shown; findings below may reference images not displayed]

FINDINGS: Normal ventricular morphology.
No midline shift or mass effect.
Normal appearance of brain parenchyma.
No intracranial hemorrhage, mass lesion, or acute infarction.
Visualized paranasal sinuses and mastoid air cells clear.
Bones unremarkable.
IMPRESSION: No acute intracranial abnormalities.

## 2012-02-01 ENCOUNTER — Other Ambulatory Visit (HOSPITAL_COMMUNITY)
Admission: RE | Admit: 2012-02-01 | Discharge: 2012-02-01 | Disposition: A | Discharge status: Home / Self Care | Payer: 59 | Source: Ambulatory Visit | Attending: Obstetrics and Gynecology | Admitting: Obstetrics and Gynecology

## 2012-02-01 ENCOUNTER — Other Ambulatory Visit: Payer: Self-pay | Admitting: Family Medicine

## 2012-02-01 DIAGNOSIS — N6459 Other signs and symptoms in breast: Secondary | ICD-10-CM | POA: Insufficient documentation

## 2012-02-25 ENCOUNTER — Emergency Department (HOSPITAL_COMMUNITY)
Admission: EM | Admit: 2012-02-25 | Discharge: 2012-02-25 | Discharge status: Against Medical Advice | Payer: 59 | Attending: Emergency Medicine | Admitting: Emergency Medicine

## 2012-02-25 ENCOUNTER — Encounter (HOSPITAL_COMMUNITY): Payer: Self-pay | Admitting: *Deleted

## 2012-02-25 DIAGNOSIS — N949 Unspecified condition associated with female genital organs and menstrual cycle: Secondary | ICD-10-CM | POA: Insufficient documentation

## 2012-02-25 DIAGNOSIS — Z975 Presence of (intrauterine) contraceptive device: Secondary | ICD-10-CM | POA: Insufficient documentation

## 2012-02-25 HISTORY — DX: Bipolar disorder, unspecified: F31.9

## 2012-02-25 NOTE — ED Notes (Signed)
Patient with sudden onset of acute pelvic pain about an hour ago.  Patient has a [place] IUD that was inserted in 12/12.  Denies any vaginal discharge.  Patient has felt bloated for the past few days and her breasts are hurting

## 2012-02-25 NOTE — ED Notes (Signed)
Pt informed registration staff she was leaving. 

## 2012-04-21 ENCOUNTER — Emergency Department (HOSPITAL_COMMUNITY): Payer: 59

## 2012-04-21 ENCOUNTER — Encounter (HOSPITAL_COMMUNITY): Payer: Self-pay | Admitting: Anesthesiology

## 2012-04-21 ENCOUNTER — Observation Stay (HOSPITAL_COMMUNITY)
Admission: EM | Admit: 2012-04-21 | Discharge: 2012-04-21 | Disposition: A | Discharge status: Home / Self Care | Payer: 59 | Attending: General Surgery | Admitting: General Surgery

## 2012-04-21 ENCOUNTER — Encounter (HOSPITAL_COMMUNITY): Payer: Self-pay | Admitting: *Deleted

## 2012-04-21 ENCOUNTER — Observation Stay (HOSPITAL_COMMUNITY): Payer: 59 | Admitting: Anesthesiology

## 2012-04-21 ENCOUNTER — Encounter (HOSPITAL_COMMUNITY)
Admission: EM | Disposition: A | Discharge status: Home / Self Care | Payer: Self-pay | Source: Home / Self Care | Attending: Emergency Medicine

## 2012-04-21 DIAGNOSIS — S3660XA Unspecified injury of rectum, initial encounter: Secondary | ICD-10-CM

## 2012-04-21 DIAGNOSIS — W010XXA Fall on same level from slipping, tripping and stumbling without subsequent striking against object, initial encounter: Secondary | ICD-10-CM | POA: Insufficient documentation

## 2012-04-21 DIAGNOSIS — S31809A Unspecified open wound of unspecified buttock, initial encounter: Principal | ICD-10-CM | POA: Insufficient documentation

## 2012-04-21 DIAGNOSIS — Y92009 Unspecified place in unspecified non-institutional (private) residence as the place of occurrence of the external cause: Secondary | ICD-10-CM | POA: Insufficient documentation

## 2012-04-21 HISTORY — PX: EXAMINATION UNDER ANESTHESIA: SHX1540

## 2012-04-21 LAB — BASIC METABOLIC PANEL
BUN: 13 mg/dL (ref 6–23)
CO2: 26 mEq/L (ref 19–32)
Calcium: 9.2 mg/dL (ref 8.4–10.5)
Chloride: 96 mEq/L (ref 96–112)
Creatinine, Ser: 0.62 mg/dL (ref 0.50–1.10)
GFR calc Af Amer: >90 mL/min (ref >90)
GFR calc non Af Amer: >90 mL/min (ref >90)
Glucose, Bld: 98 mg/dL (ref 70–99)
Potassium: 3.7 mEq/L (ref 3.5–5.1)
Sodium: 134 mEq/L — ABNORMAL LOW (ref 135–145)

## 2012-04-21 LAB — DIFFERENTIAL
Basophils Absolute: 0 10*3/uL (ref 0.0–0.1)
Basophils Relative: 1 % (ref 0–1)
Eosinophils Absolute: 0.2 10*3/uL (ref 0.0–0.7)
Eosinophils Relative: 2 % (ref 0–5)
Lymphocytes Relative: 23 % (ref 12–46)
Lymphs Abs: 1.9 10*3/uL (ref 0.7–4.0)
Monocytes Absolute: 0.7 10*3/uL (ref 0.1–1.0)
Monocytes Relative: 8 % (ref 3–12)
Neutro Abs: 5.7 10*3/uL (ref 1.7–7.7)
Neutrophils Relative %: 67 % (ref 43–77)

## 2012-04-21 LAB — URINALYSIS, ROUTINE W REFLEX MICROSCOPIC
Bilirubin Urine: NEGATIVE
Glucose, UA: NEGATIVE mg/dL
Ketones, ur: NEGATIVE mg/dL
Leukocytes, UA: NEGATIVE
Nitrite: NEGATIVE
Protein, ur: NEGATIVE mg/dL
Specific Gravity, Urine: 1.01 (ref 1.005–1.030)
Urobilinogen, UA: 0.2 mg/dL (ref 0.0–1.0)
pH: 6 (ref 5.0–8.0)

## 2012-04-21 LAB — SURGICAL PCR SCREEN
MRSA, PCR: NEGATIVE
Staphylococcus aureus: POSITIVE — AB

## 2012-04-21 LAB — CBC
HCT: 40.2 % (ref 36.0–46.0)
Hemoglobin: 13.7 g/dL (ref 12.0–15.0)
MCH: 30.9 pg (ref 26.0–34.0)
MCHC: 34.1 g/dL (ref 30.0–36.0)
MCV: 90.5 fL (ref 78.0–100.0)
Platelets: 209 10*3/uL (ref 150–400)
RBC: 4.44 MIL/uL (ref 3.87–5.11)
RDW: 12.4 % (ref 11.5–15.5)
WBC: 8.5 10*3/uL (ref 4.0–10.5)

## 2012-04-21 LAB — URINE MICROSCOPIC-ADD ON

## 2012-04-21 LAB — PREGNANCY, URINE: Preg Test, Ur: NEGATIVE

## 2012-04-21 SURGERY — EXAM UNDER ANESTHESIA
Anesthesia: General | Wound class: Contaminated

## 2012-04-21 MED ORDER — SODIUM CHLORIDE 0.9 % IV BOLUS (SEPSIS)
250.0000 mL | Freq: Once | INTRAVENOUS | Status: AC
  Administered 2012-04-21: 250 mL via INTRAVENOUS

## 2012-04-21 MED ORDER — ONDANSETRON HCL 4 MG/2ML IJ SOLN
INTRAMUSCULAR | Status: DC | PRN
  Administered 2012-04-21: 4 mg via INTRAVENOUS

## 2012-04-21 MED ORDER — GLYCOPYRROLATE 0.2 MG/ML IJ SOLN
INTRAMUSCULAR | Status: AC
  Filled 2012-04-21: qty 1

## 2012-04-21 MED ORDER — PROPOFOL 10 MG/ML IV BOLUS
INTRAVENOUS | Status: DC | PRN
  Administered 2012-04-21: 160 mg via INTRAVENOUS

## 2012-04-21 MED ORDER — LIDOCAINE HCL (PF) 1 % IJ SOLN
INTRAMUSCULAR | Status: AC
  Filled 2012-04-21: qty 5

## 2012-04-21 MED ORDER — CELECOXIB 100 MG PO CAPS
200.0000 mg | ORAL_CAPSULE | Freq: Two times a day (BID) | ORAL | Status: DC
  Administered 2012-04-21: 200 mg via ORAL
  Filled 2012-04-21: qty 2

## 2012-04-21 MED ORDER — ONDANSETRON HCL 4 MG/2ML IJ SOLN
INTRAMUSCULAR | Status: AC
  Filled 2012-04-21: qty 2

## 2012-04-21 MED ORDER — HYDROMORPHONE HCL PF 1 MG/ML IJ SOLN
1.0000 mg | Freq: Once | INTRAMUSCULAR | Status: AC
  Administered 2012-04-21: 1 mg via INTRAVENOUS
  Filled 2012-04-21: qty 1

## 2012-04-21 MED ORDER — PROPOFOL 10 MG/ML IV EMUL
INTRAVENOUS | Status: AC
  Filled 2012-04-21: qty 20

## 2012-04-21 MED ORDER — LORAZEPAM 2 MG/ML IJ SOLN
1.0000 mg | Freq: Once | INTRAMUSCULAR | Status: AC
  Administered 2012-04-21: 1 mg via INTRAVENOUS
  Filled 2012-04-21: qty 1

## 2012-04-21 MED ORDER — BUPIVACAINE HCL (PF) 0.5 % IJ SOLN
INTRAMUSCULAR | Status: DC | PRN
  Administered 2012-04-21: 30 mL

## 2012-04-21 MED ORDER — DEXTROSE 5 % IV SOLN
2.0000 g | Freq: Four times a day (QID) | INTRAVENOUS | Status: DC
  Administered 2012-04-21 (×2): 2 g via INTRAVENOUS
  Filled 2012-04-21 (×9): qty 2

## 2012-04-21 MED ORDER — DEXTROSE 5 % IV SOLN
INTRAVENOUS | Status: AC
  Filled 2012-04-21: qty 2

## 2012-04-21 MED ORDER — FENTANYL CITRATE 0.05 MG/ML IJ SOLN
INTRAMUSCULAR | Status: DC | PRN
  Administered 2012-04-21 (×2): 50 ug via INTRAVENOUS

## 2012-04-21 MED ORDER — ONDANSETRON HCL 4 MG/2ML IJ SOLN
4.0000 mg | Freq: Once | INTRAMUSCULAR | Status: AC
  Administered 2012-04-21: 4 mg via INTRAVENOUS
  Filled 2012-04-21: qty 2

## 2012-04-21 MED ORDER — ROCURONIUM BROMIDE 50 MG/5ML IV SOLN
INTRAVENOUS | Status: AC
  Filled 2012-04-21: qty 1

## 2012-04-21 MED ORDER — BUPIVACAINE HCL (PF) 0.5 % IJ SOLN
INTRAMUSCULAR | Status: AC
  Filled 2012-04-21: qty 30

## 2012-04-21 MED ORDER — DEXTROSE 5 % IV SOLN
2.0000 g | Freq: Four times a day (QID) | INTRAVENOUS | Status: DC
  Administered 2012-04-21 (×2): 2 g via INTRAVENOUS
  Filled 2012-04-21 (×10): qty 2

## 2012-04-21 MED ORDER — GLYCOPYRROLATE 0.2 MG/ML IJ SOLN
INTRAMUSCULAR | Status: DC | PRN
  Administered 2012-04-21: 0.2 mg via INTRAVENOUS

## 2012-04-21 MED ORDER — POVIDONE-IODINE 10 % EX OINT
TOPICAL_OINTMENT | CUTANEOUS | Status: AC
  Filled 2012-04-21: qty 2

## 2012-04-21 MED ORDER — LAMOTRIGINE 100 MG PO TABS
100.0000 mg | ORAL_TABLET | Freq: Every day | ORAL | Status: DC
  Administered 2012-04-21: 100 mg via ORAL
  Filled 2012-04-21 (×3): qty 1

## 2012-04-21 MED ORDER — LIDOCAINE HCL 1 % IJ SOLN
INTRAMUSCULAR | Status: DC | PRN
  Administered 2012-04-21: 50 mg via INTRADERMAL

## 2012-04-21 MED ORDER — NEOSTIGMINE METHYLSULFATE 1 MG/ML IJ SOLN
INTRAMUSCULAR | Status: DC | PRN
  Administered 2012-04-21: 3 mg via INTRAVENOUS

## 2012-04-21 MED ORDER — LORAZEPAM 2 MG/ML IJ SOLN: 1.0000 mg | Freq: Four times a day (QID) | INTRAMUSCULAR | Status: DC

## 2012-04-21 MED ORDER — MIDAZOLAM HCL 5 MG/5ML IJ SOLN
INTRAMUSCULAR | Status: DC | PRN
  Administered 2012-04-21: 2 mg via INTRAVENOUS

## 2012-04-21 MED ORDER — CLONAZEPAM 0.5 MG PO TABS: 0.5000 mg | ORAL_TABLET | Freq: Two times a day (BID) | ORAL | Status: DC | PRN

## 2012-04-21 MED ORDER — ROCURONIUM BROMIDE 100 MG/10ML IV SOLN
INTRAVENOUS | Status: DC | PRN
  Administered 2012-04-21: 35 mg via INTRAVENOUS

## 2012-04-21 MED ORDER — LACTATED RINGERS IV SOLN
INTRAVENOUS | Status: DC | PRN
  Administered 2012-04-21: 08:00:00 via INTRAVENOUS

## 2012-04-21 MED ORDER — LORAZEPAM 2 MG/ML IJ SOLN
1.0000 mg | Freq: Four times a day (QID) | INTRAMUSCULAR | Status: DC | PRN
  Administered 2012-04-21: 1 mg via INTRAVENOUS
  Filled 2012-04-21: qty 1

## 2012-04-21 MED ORDER — HYDROMORPHONE HCL PF 1 MG/ML IJ SOLN
1.0000 mg | INTRAMUSCULAR | Status: DC | PRN
  Administered 2012-04-21: 1 mg via INTRAVENOUS
  Filled 2012-04-21: qty 1

## 2012-04-21 MED ORDER — FENTANYL CITRATE 0.05 MG/ML IJ SOLN
INTRAMUSCULAR | Status: AC
  Filled 2012-04-21: qty 2

## 2012-04-21 MED ORDER — HYDROMORPHONE HCL PF 1 MG/ML IJ SOLN: 1.0000 mg | INTRAMUSCULAR | Status: DC | PRN

## 2012-04-21 MED ORDER — ONDANSETRON HCL 4 MG/2ML IJ SOLN: 4.0000 mg | Freq: Three times a day (TID) | INTRAMUSCULAR | Status: AC | PRN

## 2012-04-21 MED ORDER — SODIUM CHLORIDE 0.9 % IJ SOLN
INTRAMUSCULAR | Status: AC
  Administered 2012-04-21: 10 mL
  Filled 2012-04-21: qty 3

## 2012-04-21 MED ORDER — MIDAZOLAM HCL 2 MG/2ML IJ SOLN
INTRAMUSCULAR | Status: AC
  Filled 2012-04-21: qty 2

## 2012-04-21 MED ORDER — SODIUM CHLORIDE 0.9 % IV SOLN
INTRAVENOUS | Status: AC
  Administered 2012-04-21: 05:00:00 via INTRAVENOUS

## 2012-04-21 MED ORDER — SODIUM CHLORIDE 0.9 % IV SOLN
INTRAVENOUS | Status: DC
  Administered 2012-04-21: 02:00:00 via INTRAVENOUS

## 2012-04-21 MED ORDER — POVIDONE-IODINE 10 % OINT PACKET
TOPICAL_OINTMENT | CUTANEOUS | Status: DC | PRN
  Administered 2012-04-21: 2 via TOPICAL

## 2012-04-21 MED ORDER — ESCITALOPRAM OXALATE 10 MG PO TABS
20.0000 mg | ORAL_TABLET | Freq: Every day | ORAL | Status: DC
  Administered 2012-04-21: 20 mg via ORAL
  Filled 2012-04-21: qty 2

## 2012-04-21 MED ORDER — HYDROCODONE-ACETAMINOPHEN 5-325 MG PO TABS
1.0000 | ORAL_TABLET | ORAL | Status: DC | PRN
  Administered 2012-04-21: 1 via ORAL
  Filled 2012-04-21: qty 1

## 2012-04-21 SURGICAL SUPPLY — 29 items
BAG HAMPER (MISCELLANEOUS) ×2 IMPLANT
CLOTH BEACON ORANGE TIMEOUT ST (SAFETY) ×2 IMPLANT
COVER LIGHT HANDLE STERIS (MISCELLANEOUS) ×4 IMPLANT
DECANTER SPIKE VIAL GLASS SM (MISCELLANEOUS) ×2 IMPLANT
DRAPE PROXIMA HALF (DRAPES) ×1 IMPLANT
ELECT REM PT RETURN 9FT ADLT (ELECTROSURGICAL) ×2
ELECTRODE REM PT RTRN 9FT ADLT (ELECTROSURGICAL) IMPLANT
GLOVE BIOGEL PI IND STRL 7.0 (GLOVE) ×2 IMPLANT
GLOVE BIOGEL PI IND STRL 7.5 (GLOVE) ×1 IMPLANT
GLOVE BIOGEL PI INDICATOR 7.0 (GLOVE) ×2
GLOVE BIOGEL PI INDICATOR 7.5 (GLOVE) ×1
GLOVE ECLIPSE 7.0 STRL STRAW (GLOVE) ×2 IMPLANT
GLOVE EXAM NITRILE MD LF STRL (GLOVE) ×2 IMPLANT
GLOVE OPTIFIT SS 6.5 STRL BRWN (GLOVE) ×1 IMPLANT
GOWN STRL REIN XL XLG (GOWN DISPOSABLE) ×6 IMPLANT
KIT ROOM TURNOVER AP CYSTO (KITS) ×1 IMPLANT
MANIFOLD NEPTUNE II (INSTRUMENTS) ×2 IMPLANT
MARKER SKIN DUAL TIP RULER LAB (MISCELLANEOUS) IMPLANT
NEEDLE HYPO 25X1 1.5 SAFETY (NEEDLE) ×2 IMPLANT
PACK PERI GYN (CUSTOM PROCEDURE TRAY) ×1 IMPLANT
PAD ARMBOARD 7.5X6 YLW CONV (MISCELLANEOUS) ×2 IMPLANT
SOL PREP PROV IODINE SCRUB 4OZ (MISCELLANEOUS) ×1 IMPLANT
SPONGE GAUZE 4X4 12PLY (GAUZE/BANDAGES/DRESSINGS) ×1 IMPLANT
SUT CHROMIC 3 0 SH 27 (SUTURE) ×2 IMPLANT
SUT PROLENE 3 0 PS 2 (SUTURE) ×4 IMPLANT
SYR BULB IRRIGATION 50ML (SYRINGE) ×2 IMPLANT
SYR CONTROL 10ML LL (SYRINGE) ×2 IMPLANT
TAPE CLOTH SURG 4X10 WHT LF (GAUZE/BANDAGES/DRESSINGS) ×1 IMPLANT
TOWEL OR 17X26 4PK STRL BLUE (TOWEL DISPOSABLE) ×1 IMPLANT

## 2012-04-21 NOTE — Anesthesia Procedure Notes (Signed)
Procedure Name: Intubation Date/Time: 04/21/2012 7:47 AM Performed by: Despina Hidden Pre-anesthesia Checklist: Emergency Drugs available, Patient identified, Patient being monitored and Suction available Patient Re-evaluated:Patient Re-evaluated prior to inductionOxygen Delivery Method: Circle system utilized Preoxygenation: Pre-oxygenation with 100% oxygen Intubation Type: IV induction, Cricoid Pressure applied and Rapid sequence Ventilation: Mask ventilation without difficulty Laryngoscope Size: Mac and 3 Grade View: Grade I Tube type: Oral Tube size: 7.0 mm Number of attempts: 1 Airway Equipment and Method: Stylet Placement Confirmation: ETT inserted through vocal cords under direct vision,  positive ETCO2 and breath sounds checked- equal and bilateral Secured at: 21 cm Tube secured with: Tape Dental Injury: Teeth and Oropharynx as per pre-operative assessment

## 2012-04-21 NOTE — ED Notes (Signed)
Pt was showering, slipped and fell against shower handle, handle went inside rectum, pt stated she felt a tear and started bleeding immediately.

## 2012-04-21 NOTE — ED Provider Notes (Addendum)
History     CSN: [id number]  Arrival date & time 04/21/12  0037   First MD Initiated Contact with Patient 04/21/12 0102      Chief Complaint  Patient presents with  . Rectal Bleeding    (Consider location/radiation/quality/duration/timing/severity/associated sxs/prior treatment) Patient is a 26 y.o. female presenting with hematochezia. The history is provided by the patient.  Rectal Bleeding  Pertinent negatives include no fever, no abdominal pain, no nausea, no vomiting, no chest pain and no headaches.  patient arrived by POV. Patient was showering slipped and fell against a shower handle handle went inside rectum patient stated she felt a tear started bleeding immediately. Patient states that she thinks some tissue that torn off as well. No complaint of abdominal pain no other injuries.  Past Medical History  Diagnosis Date  . Anxiety   . Bipolar 1 disorder   . Anxiety     Past Surgical History  Procedure Date  . Abdominal surgery     History reviewed. No pertinent family history.  History  Substance Use Topics  . Smoking status: Current Everyday Smoker -- 0.5 packs/day    Types: Cigarettes  . Smokeless tobacco: Not on file  . Alcohol Use: Yes     occasionally    OB History    Grav Para Term Preterm Abortions TAB SAB Ect Mult Living                  Review of Systems  Constitutional: Negative for fever and chills.  HENT: Negative for neck pain.   Eyes: Negative for visual disturbance.  Respiratory: Negative for shortness of breath.   Cardiovascular: Negative for chest pain.  Gastrointestinal: Positive for hematochezia and anal bleeding. Negative for nausea, vomiting and abdominal pain.  Genitourinary: Negative for dysuria.  Musculoskeletal: Negative for back pain.  Neurological: Negative for weakness and headaches.  Hematological: Does not bruise/bleed easily.    Allergies  Review of patient's allergies indicates no known allergies.  Home Medications     Current Outpatient Rx  Name Route Sig Dispense Refill  . CLONAZEPAM 0.5 MG PO TABS Oral Take 0.5 mg by mouth 2 (two) times daily as needed. anxiety     . ESCITALOPRAM OXALATE 20 MG PO TABS Oral Take 20 mg by mouth daily.      Marland Kitchen LAMOTRIGINE 100 MG PO TABS Oral Take 100 mg by mouth daily.    Marland Kitchen METRONIDAZOLE 500 MG PO TABS  1 po bid with food 14 tablet 0    BP 114/64  Pulse 82  Temp 97.8 F (36.6 C)  Resp 15  Ht 5' (1.524 m)  Wt 140 lb (63.504 kg)  BMI 27.34 kg/m2  SpO2 100%  LMP 04/18/2012  Physical Exam  Nursing note and vitals reviewed. Constitutional: She is oriented to person, place, and time. She appears well-developed and well-nourished. No distress.  HENT:  Head: Normocephalic and atraumatic.  Mouth/Throat: Oropharynx is clear and moist.  Eyes: Conjunctivae and EOM are normal. Pupils are equal, round, and reactive to light.  Neck: Normal range of motion. Neck supple.  Cardiovascular: Normal rate, regular rhythm and normal heart sounds.   No murmur heard. Pulmonary/Chest: Effort normal and breath sounds normal.  Abdominal: Soft. Bowel sounds are normal. There is no tenderness.  Genitourinary:       Anal and buttocks area noted for the following. On the right buttocks perianal right up to the anal edge and extensive and shaped gaping laceration measuring about 5-4 cm x  7 cm into some of the issue rectal fat area up along the anus and the rectum no apparent tears into the rectum external anal sphincter seems to be intact. Bleeding but not extensive not able to determine the plugs coming from rectal area but certainly is coming from the open gaping wound in the ischial rectal buttocks area.  Musculoskeletal: Normal range of motion. She exhibits no edema and no tenderness.  Neurological: She is alert and oriented to person, place, and time. No cranial nerve deficit. She exhibits normal muscle tone. Coordination normal.  Skin: Skin is warm. No rash noted.    ED Course   Procedures (including critical care time)  Labs Reviewed  BASIC METABOLIC PANEL - Abnormal; Notable for the following:    Sodium 134 (*)    All other components within normal limits  CBC  DIFFERENTIAL   Dg Abd Acute W/chest  04/21/2012  *RADIOLOGY REPORT*  Clinical Data: Rectal bleeding  ACUTE ABDOMEN SERIES (ABDOMEN 2 VIEW & CHEST 1 VIEW)  Comparison: None.  Findings: Lungs are clear. No pleural effusion or pneumothorax.  Cardiomediastinal silhouette is within normal limits.  Visualized osseous structures are within normal limits.  IUD overlying the pelvis.  IMPRESSION: No evidence of acute cardiopulmonary disease.  No evidence of small bowel obstruction or free air.  Original Report Authenticated By: Charline Bills, M.D.   Results for orders placed during the hospital encounter of 04/21/12  CBC      Component Value Range   WBC 8.5  4.0 - 10.5 (K/uL)   RBC 4.44  3.87 - 5.11 (MIL/uL)   Hemoglobin 13.7  12.0 - 15.0 (g/dL)   HCT 19.1  47.8 - 29.5 (%)   MCV 90.5  78.0 - 100.0 (fL)   MCH 30.9  26.0 - 34.0 (pg)   MCHC 34.1  30.0 - 36.0 (g/dL)   RDW 62.1  30.8 - 65.7 (%)   Platelets 209  150 - 400 (K/uL)  DIFFERENTIAL      Component Value Range   Neutrophils Relative 67  43 - 77 (%)   Neutro Abs 5.7  1.7 - 7.7 (K/uL)   Lymphocytes Relative 23  12 - 46 (%)   Lymphs Abs 1.9  0.7 - 4.0 (K/uL)   Monocytes Relative 8  3 - 12 (%)   Monocytes Absolute 0.7  0.1 - 1.0 (K/uL)   Eosinophils Relative 2  0 - 5 (%)   Eosinophils Absolute 0.2  0.0 - 0.7 (K/uL)   Basophils Relative 1  0 - 1 (%)   Basophils Absolute 0.0  0.0 - 0.1 (K/uL)  BASIC METABOLIC PANEL      Component Value Range   Sodium 134 (*) 135 - 145 (mEq/L)   Potassium 3.7  3.5 - 5.1 (mEq/L)   Chloride 96  96 - 112 (mEq/L)   CO2 26  19 - 32 (mEq/L)   Glucose, Bld 98  70 - 99 (mg/dL)   BUN 13  6 - 23 (mg/dL)   Creatinine, Ser 8.46  0.50 - 1.10 (mg/dL)   Calcium 9.2  8.4 - 96.2 (mg/dL)   GFR calc non Af Amer >90  >90 (mL/min)    GFR calc Af Amer >90  >90 (mL/min)     No diagnosis found.    MDM  Will discuss with Gen. Surgery on-call extensive right buttocks laceration right up to the margin of the anus with some penetration into the issue rectal fat concern would be whether some injury to the  anal sphincter the laceration is somewhat and shaped with the skin pulled apart with exposure of the subcutaneous tissues measuring about 5 x 4 cm in size. X-ray show no evidence of foreign body. From my assessment think maybe this repaired in the operating room. We'll discuss with Gen. Surgery.  Surgery will admit status in take to the operating room at 7:30 in the morning. They recommended Mefoxin which is ordered patient urine and urine pregnancy test are still pending. Pain is controlled with pain medicine temporary middle orders completed. Patient has a history of diabetes but based on her medication list and blood sugar here no evidence of diabetes certainly not on medication for it.       Shelda Jakes, MD 04/21/12 Donnal Debar  Shelda Jakes, MD 04/21/12 [phone]

## 2012-04-21 NOTE — Op Note (Signed)
Patient:  Cristina Davenport  DOB:  Apr 24, 1986  MRN:  [id number]   Preop Diagnosis:  Right gluteal laceration  Postop Diagnosis:  The same  Procedure:  Exam under anesthesia and sharp surgical debridement and closure of gluteal laceration  Surgeon:  Dr. Tilford Pillar  Anes:  General endotracheal, 0.5% Sensorcaine plain for local  Indications:  Patient is a 26 year old female presented to [facility] after a fall in the shower. When she fell she hit the handle of the shower and cause a large laceration. She was evaluated in the emergency department. Due to the close proximity to the anal opening there was concern that these her muscles may be involved. Due to pain and discomfort plans were to further evaluate in the operating room and formally close at that time. Risks benefits and alternatives were discussed. Consent was obtained.  Procedure note:  Patient was taken to the or was placed in supine position on the OR table time the general anesthetic is a Optician, dispensing. Once patient was asleep she is in endotracheally intubated by the nurse anesthetist. At this point she's placed in bilateral yellow fin stirrups and placed into a lithotomy position. Her perineum and rectum were prepped with Betadine solution as well as her bilateral buttocks. Drapes are placed in standard fashion. Rectal examination demonstrated no evidence of any internal lacerations, masses, abnormalities. The sphincter muscles felt intact with no apparent laxity. At this time attention was turned to debridement of the wound and closure.  The edges of the laceration were sharply debrided using a scalpel as well as electrocautery.  The wound is irrigated this point.   With the edges of the laceration freshened they were reapproximated with 3-0 Prolene in simple interrupted fashion up to approximately 1 cm from the anal opening. At this point did opt to use 3-0 chromic again in simple or fashion.  The parents to closure. The local  anesthetic was instilled. Betadine ointment was placed over the incision. A 4 x 4 gauze dressing was rolled and placed over the area. This is briefly secured in place with Medipore tape and secured once the drapes were removed with mesh underwear. The patient was allowed to come out of general anesthetic. She was transferred to PACU in stable condition. At the conclusion of the procedure all instrument, sponge, needle counts are correct. Patient tolerated procedure extremely well.  Complications:  None apparent  EBL:  Minimal  Specimen:  None

## 2012-04-21 NOTE — Progress Notes (Signed)
Patient tolerated regular diet without complaint. States pain is controlled with po medication. Patient states understanding of discharge instructions.

## 2012-04-21 NOTE — ED Notes (Signed)
Pt has an approx 3 inch tear to the right of rectum, undetrmined depth with loose skin hanging around wound, bleeding controlled at this moment. MD made aware.

## 2012-04-21 NOTE — H&P (Signed)
Cristina Davenport is an 26 y.o. female.   Chief Complaint: Perirectal laceration HPI: Patient is a 26 year old female with a history of anxiety who presents to [facility] emergency department earlier this morning with injury following a fall in the shower. Patient apparently slipped and fell backwards on the handle of the shower. She had immediate pain and bleeding. No other trauma. No other complaints of pain. No loss of consciousness. Evaluation by the emergency room physician noted extensive laceration up to the area of the anal opening. Surgical consultation was obtained. Patient has no other complaints. No nausea. She does have significant anxiety as quite anxious about proceeding to the operating room.  Past Medical History  Diagnosis Date  . Anxiety   . Bipolar 1 disorder   . Anxiety     Past Surgical History  Procedure Date  . Abdominal surgery     History reviewed. No pertinent family history. Social History:  reports that she has been smoking Cigarettes.  She has been smoking about .25 packs per day. She does not have any smokeless tobacco history on file. She reports that she drinks alcohol. She reports that she does not use illicit drugs.  Allergies: No Known Allergies  Medications Prior to Admission  Medication Sig Dispense Refill  . clonazePAM (KLONOPIN) 0.5 MG tablet Take 0.5 mg by mouth 2 (two) times daily as needed. anxiety       . escitalopram (LEXAPRO) 20 MG tablet Take 20 mg by mouth daily.        Marland Kitchen lamoTRIgine (LAMICTAL) 100 MG tablet Take 100 mg by mouth daily.      . metroNIDAZOLE (FLAGYL) 500 MG tablet 1 po bid with food  14 tablet  0    Results for orders placed during the hospital encounter of 04/21/12 (from the past 48 hour(s))  CBC     Status: Normal   Collection Time   04/21/12  1:06 AM      Component Value Range Comment   WBC 8.5  4.0 - 10.5 (K/uL)    RBC 4.44  3.87 - 5.11 (MIL/uL)    Hemoglobin 13.7  12.0 - 15.0 (g/dL)    HCT 11.9  14.7 - 82.9 (%)    MCV  90.5  78.0 - 100.0 (fL)    MCH 30.9  26.0 - 34.0 (pg)    MCHC 34.1  30.0 - 36.0 (g/dL)    RDW 56.2  13.0 - 86.5 (%)    Platelets 209  150 - 400 (K/uL)   DIFFERENTIAL     Status: Normal   Collection Time   04/21/12  1:06 AM      Component Value Range Comment   Neutrophils Relative 67  43 - 77 (%)    Neutro Abs 5.7  1.7 - 7.7 (K/uL)    Lymphocytes Relative 23  12 - 46 (%)    Lymphs Abs 1.9  0.7 - 4.0 (K/uL)    Monocytes Relative 8  3 - 12 (%)    Monocytes Absolute 0.7  0.1 - 1.0 (K/uL)    Eosinophils Relative 2  0 - 5 (%)    Eosinophils Absolute 0.2  0.0 - 0.7 (K/uL)    Basophils Relative 1  0 - 1 (%)    Basophils Absolute 0.0  0.0 - 0.1 (K/uL)   BASIC METABOLIC PANEL     Status: Abnormal   Collection Time   04/21/12  1:06 AM      Component Value Range Comment  Sodium 134 (*) 135 - 145 (mEq/L)    Potassium 3.7  3.5 - 5.1 (mEq/L)    Chloride 96  96 - 112 (mEq/L)    CO2 26  19 - 32 (mEq/L)    Glucose, Bld 98  70 - 99 (mg/dL)    BUN 13  6 - 23 (mg/dL)    Creatinine, Ser 1.61  0.50 - 1.10 (mg/dL)    Calcium 9.2  8.4 - 10.5 (mg/dL)    GFR calc non Af Amer >90  >90 (mL/min)    GFR calc Af Amer >90  >90 (mL/min)   URINALYSIS, ROUTINE W REFLEX MICROSCOPIC     Status: Abnormal   Collection Time   04/21/12  3:16 AM      Component Value Range Comment   Color, Urine YELLOW  YELLOW     APPearance CLEAR  CLEAR     Specific Gravity, Urine 1.010  1.005 - 1.030     pH 6.0  5.0 - 8.0     Glucose, UA NEGATIVE  NEGATIVE (mg/dL)    Hgb urine dipstick TRACE (*) NEGATIVE     Bilirubin Urine NEGATIVE  NEGATIVE     Ketones, ur NEGATIVE  NEGATIVE (mg/dL)    Protein, ur NEGATIVE  NEGATIVE (mg/dL)    Urobilinogen, UA 0.2  0.0 - 1.0 (mg/dL)    Nitrite NEGATIVE  NEGATIVE     Leukocytes, UA NEGATIVE  NEGATIVE    PREGNANCY, URINE     Status: Normal   Collection Time   04/21/12  3:16 AM      Component Value Range Comment   Preg Test, Ur NEGATIVE  NEGATIVE    URINE MICROSCOPIC-ADD ON     Status: Abnormal     Collection Time   04/21/12  3:16 AM      Component Value Range Comment   Squamous Epithelial / LPF FEW (*) RARE     WBC, UA 0-2  <3 (WBC/hpf)    RBC / HPF 0-2  <3 (RBC/hpf)    Bacteria, UA RARE  RARE     Dg Abd Acute W/chest  04/21/2012  *RADIOLOGY REPORT*  Clinical Data: Rectal bleeding  ACUTE ABDOMEN SERIES (ABDOMEN 2 VIEW & CHEST 1 VIEW)  Comparison: None.  Findings: Lungs are clear. No pleural effusion or pneumothorax.  Cardiomediastinal silhouette is within normal limits.  Visualized osseous structures are within normal limits.  IUD overlying the pelvis.  IMPRESSION: No evidence of acute cardiopulmonary disease.  No evidence of small bowel obstruction or free air.  Original Report Authenticated By: Charline Bills, M.D.    Review of Systems  Constitutional: Negative.   HENT: Negative.   Eyes: Negative.   Respiratory: Negative.   Cardiovascular: Negative.   Gastrointestinal: Negative for heartburn, nausea, vomiting and abdominal pain.       Bleeding from perirectal area  Genitourinary: Negative.   Musculoskeletal: Negative.   Skin: Negative.   Neurological: Negative.   Endo/Heme/Allergies: Negative.   Psychiatric/Behavioral: The patient is nervous/anxious.     Blood pressure 109/68, pulse 84, temperature 97.8 F (36.6 C), temperature source Oral, resp. rate 16, height 5' (1.524 m), weight 64.411 kg (142 lb), last menstrual period 04/18/2012, SpO2 98.00%. Physical Exam  Constitutional: She is oriented to person, place, and time. She appears well-developed and well-nourished. No distress.       Anxious  HENT:  Head: Normocephalic and atraumatic.  Eyes: Conjunctivae and EOM are normal. Pupils are equal, round, and reactive to light.  Neck: Normal range  of motion. Neck supple. No tracheal deviation present. No thyromegaly present.  Cardiovascular: Normal rate, regular rhythm and normal heart sounds.   Respiratory: Effort normal and breath sounds normal. No respiratory distress.   GI: Soft. Bowel sounds are normal. She exhibits no distension. There is no tenderness. There is no rebound and no guarding.       Approximately 7-8 cm laceration on the right buttocks. Exquisitely tender. Some bleeding.  Lymphadenopathy:    She has no cervical adenopathy.  Neurological: She is alert and oriented to person, place, and time.  Skin: Skin is warm and dry.     Assessment/Plan Gluteal laceration. Due to the depth and location I agreed patient will benefit from surgical closure in the operating room. This will also allows the opportunity to further evaluate the anal opening to ensure that the suture musculature is intact. As she's not had any incontinence at this point my suspicion of this is extremely low. Risks benefits alternatives were discussed. We'll continue antibiotics. Continue n.p.o. for now. Continue IV fluid hydration. We'll proceed to the operating room for surgical closure of the laceration and exploration of the perirectal musculature  Kerston Landeck C 04/21/2012, 7:26 AM

## 2012-04-21 NOTE — Anesthesia Postprocedure Evaluation (Signed)
  Anesthesia Post-op Note  Patient: Cristina Davenport  Procedure(s) Performed: Procedure(s) (LRB): EXAM UNDER ANESTHESIA (N/A) DEBRIDEMENT AND CLOSURE WOUND (N/A)  Patient Location: PACU  Anesthesia Type: General  Level of Consciousness: awake, alert , oriented and patient cooperative  Airway and Oxygen Therapy: Patient Spontanous Breathing  Post-op Pain: none  Post-op Assessment: Post-op Vital signs reviewed, Patient's Cardiovascular Status Stable, Respiratory Function Stable, Patent Airway, No signs of Nausea or vomiting and Pain level controlled  Post-op Vital Signs: Reviewed and stable  Complications: No apparent anesthesia complications

## 2012-04-21 NOTE — Transfer of Care (Signed)
Immediate Anesthesia Transfer of Care Note  Patient: Cristina Davenport  Procedure(s) Performed: Procedure(s) (LRB): EXAM UNDER ANESTHESIA (N/A) DEBRIDEMENT AND CLOSURE WOUND (N/A)  Patient Location: PACU  Anesthesia Type: General  Level of Consciousness: awake, alert  and patient cooperative  Airway & Oxygen Therapy: Patient Spontanous Breathing and Patient connected to face mask oxygen  Post-op Assessment: Report given to PACU RN, Post -op Vital signs reviewed and stable and Patient moving all extremities  Post vital signs: Reviewed and stable  Complications: No apparent anesthesia complications

## 2012-04-21 NOTE — Anesthesia Preprocedure Evaluation (Addendum)
Anesthesia Evaluation   Patient awake    Reviewed: Allergy & Precautions, H&P , NPO status , Patient's Chart, lab work & pertinent test results, reviewed documented beta blocker date and time   History of Anesthesia Complications (+) Emergence Delirium  Airway Mallampati: I TM Distance: >3 FB Neck ROM: full    Dental   Pulmonary Current Smoker,          Cardiovascular Exercise Tolerance: Good     Neuro/Psych Anxiety Bipolar Disorder    GI/Hepatic negative GI ROS, Neg liver ROS,   Endo/Other  negative endocrine ROS  Renal/GU      Musculoskeletal   Abdominal   Peds  Hematology   Anesthesia Other Findings   Reproductive/Obstetrics                          Anesthesia Physical Anesthesia Plan  ASA: II  Anesthesia Plan: General   Post-op Pain Management:    Induction: Intravenous, Rapid sequence and Cricoid pressure planned  Airway Management Planned: Oral ETT  Additional Equipment:   Intra-op Plan:   Post-operative Plan: Extubation in OR  Informed Consent: I have reviewed the patients History and Physical, chart, labs and discussed the procedure including the risks, benefits and alternatives for the proposed anesthesia with the patient or authorized representative who has indicated his/her understanding and acceptance.   Dental advisory given  Plan Discussed with: Anesthesiologist and CRNA  Anesthesia Plan Comments:         Anesthesia Quick Evaluation

## 2012-04-24 ENCOUNTER — Encounter (HOSPITAL_COMMUNITY): Payer: Self-pay | Admitting: General Surgery

## 2012-04-28 ENCOUNTER — Encounter (HOSPITAL_COMMUNITY): Payer: Self-pay | Admitting: Emergency Medicine

## 2012-04-28 ENCOUNTER — Emergency Department (HOSPITAL_COMMUNITY)
Admission: EM | Admit: 2012-04-28 | Discharge: 2012-04-28 | Disposition: A | Discharge status: Home / Self Care | Payer: 59 | Attending: Emergency Medicine | Admitting: Emergency Medicine

## 2012-04-28 ENCOUNTER — Emergency Department (HOSPITAL_COMMUNITY): Payer: 59

## 2012-04-28 DIAGNOSIS — T8131XA Disruption of external operation (surgical) wound, not elsewhere classified, initial encounter: Secondary | ICD-10-CM | POA: Insufficient documentation

## 2012-04-28 DIAGNOSIS — F319 Bipolar disorder, unspecified: Secondary | ICD-10-CM | POA: Insufficient documentation

## 2012-04-28 DIAGNOSIS — Y849 Medical procedure, unspecified as the cause of abnormal reaction of the patient, or of later complication, without mention of misadventure at the time of the procedure: Secondary | ICD-10-CM | POA: Insufficient documentation

## 2012-04-28 DIAGNOSIS — F17200 Nicotine dependence, unspecified, uncomplicated: Secondary | ICD-10-CM | POA: Insufficient documentation

## 2012-04-28 DIAGNOSIS — T8130XA Disruption of wound, unspecified, initial encounter: Secondary | ICD-10-CM

## 2012-04-28 DIAGNOSIS — F411 Generalized anxiety disorder: Secondary | ICD-10-CM | POA: Insufficient documentation

## 2012-04-28 LAB — CBC
HCT: 38.6 % (ref 36.0–46.0)
Hemoglobin: 12.8 g/dL (ref 12.0–15.0)
MCH: 30.3 pg (ref 26.0–34.0)
MCHC: 33.2 g/dL (ref 30.0–36.0)
MCV: 91.3 fL (ref 78.0–100.0)
Platelets: 225 10*3/uL (ref 150–400)
RBC: 4.23 MIL/uL (ref 3.87–5.11)
RDW: 12.7 % (ref 11.5–15.5)
WBC: 7.8 10*3/uL (ref 4.0–10.5)

## 2012-04-28 LAB — DIFFERENTIAL
Basophils Absolute: 0 10*3/uL (ref 0.0–0.1)
Basophils Relative: 0 % (ref 0–1)
Eosinophils Absolute: 0.2 10*3/uL (ref 0.0–0.7)
Eosinophils Relative: 2 % (ref 0–5)
Lymphocytes Relative: 18 % (ref 12–46)
Lymphs Abs: 1.4 10*3/uL (ref 0.7–4.0)
Monocytes Absolute: 0.7 10*3/uL (ref 0.1–1.0)
Monocytes Relative: 9 % (ref 3–12)
Neutro Abs: 5.5 10*3/uL (ref 1.7–7.7)
Neutrophils Relative %: 70 % (ref 43–77)

## 2012-04-28 LAB — BASIC METABOLIC PANEL
BUN: 11 mg/dL (ref 6–23)
CO2: 25 mEq/L (ref 19–32)
Calcium: 9 mg/dL (ref 8.4–10.5)
Chloride: 100 mEq/L (ref 96–112)
Creatinine, Ser: 0.57 mg/dL (ref 0.50–1.10)
GFR calc Af Amer: >90 mL/min (ref >90)
GFR calc non Af Amer: >90 mL/min (ref >90)
Glucose, Bld: 88 mg/dL (ref 70–99)
Potassium: 4.1 mEq/L (ref 3.5–5.1)
Sodium: 134 mEq/L — ABNORMAL LOW (ref 135–145)

## 2012-04-28 IMAGING — CT CT PELVIS W/ CM
2 of 4 series · 17 of 46 positions shown, 19 images · IV contrast (Omnipaque 300)
Comparison: None.

CLINICAL DATA: Dehiscence of surgical wound in buttock

CT PELVIS WITH CONTRAST
TECHNIQUE: Multidetector CT imaging of the pelvis was performed
using the standard protocol following the bolus administration of
intravenous contrast.
Contrast: 100mL OMNIPAQUE IOHEXOL 300 MG/ML  SOLN

[Series 2: pelvis 5.0 b40f · axial · 0.83mm/px · z∈[-306,-26]mm · 14 of 66 slices shown, 16 images]
[im 5/66  soft-tissue]
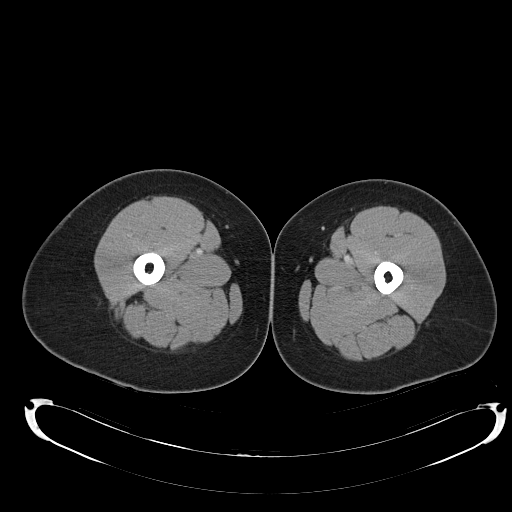
[im 5/66  bone]
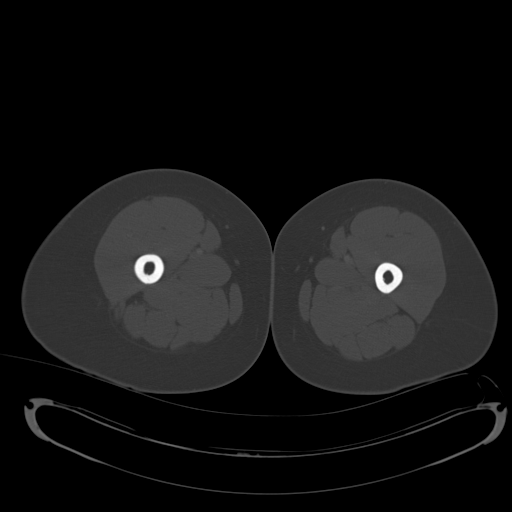
[im 9/66  soft-tissue]
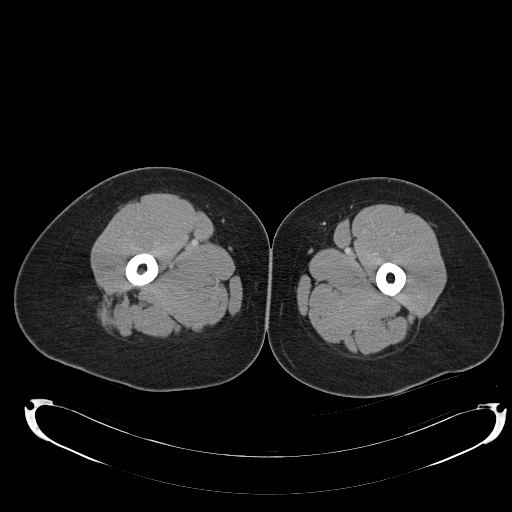
[im 13/66  soft-tissue]
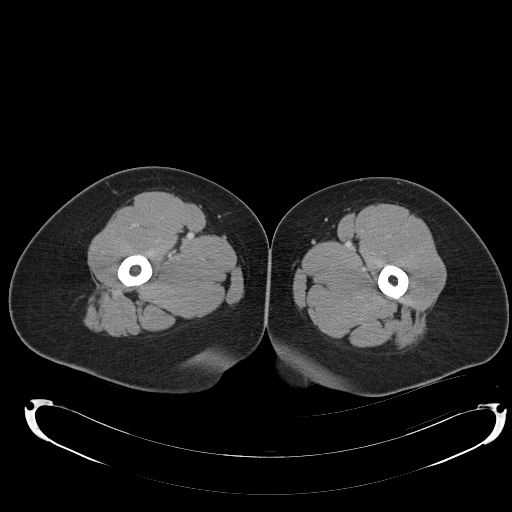
[im 17/66  soft-tissue]
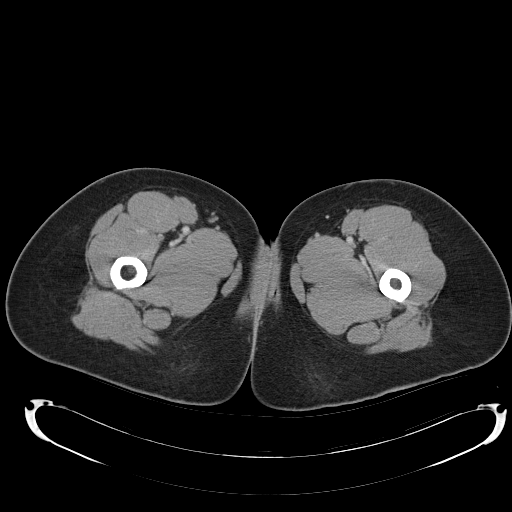
[im 21/66  soft-tissue]
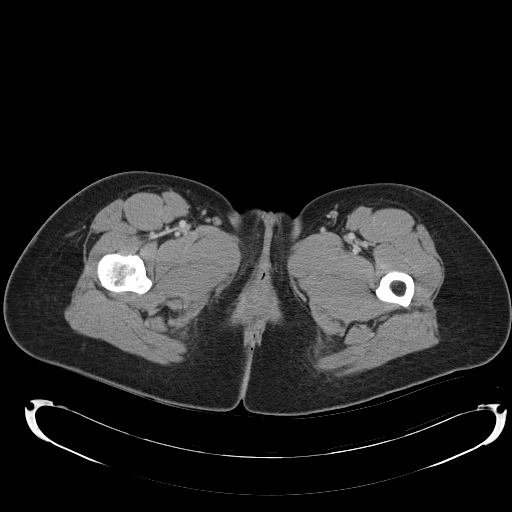
[im 25/66  soft-tissue]
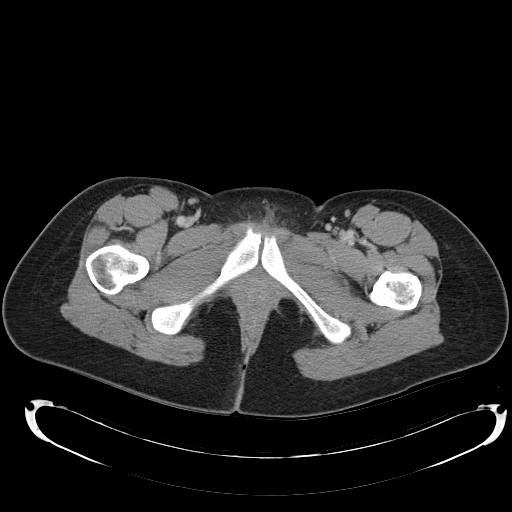
[im 29/66  soft-tissue]
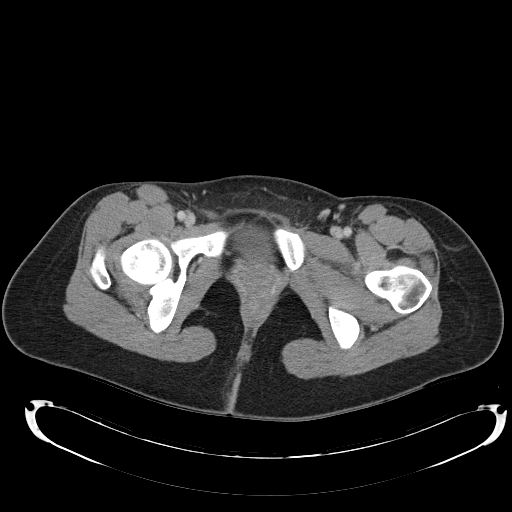
[im 37/66  soft-tissue]
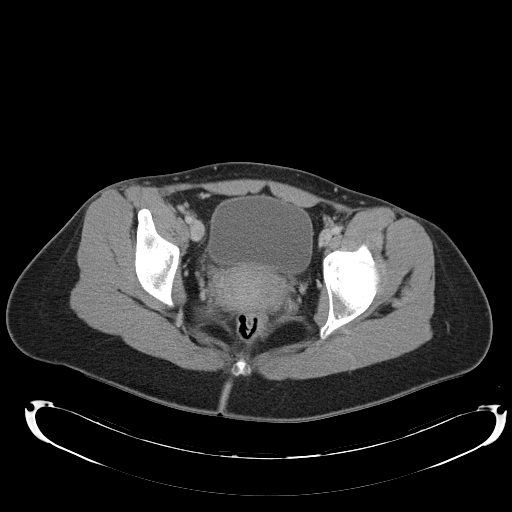
[im 41/66  soft-tissue]
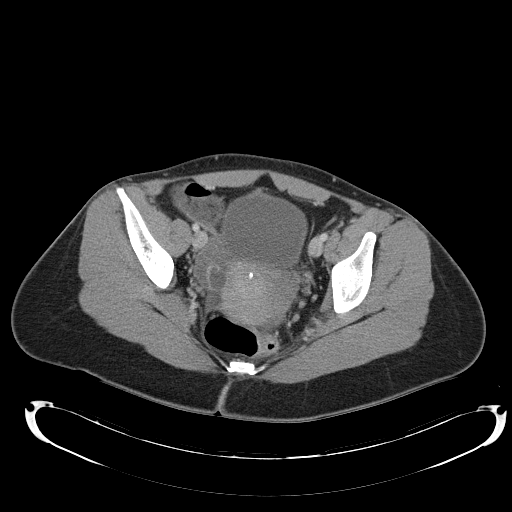
[im 41/66  bone]
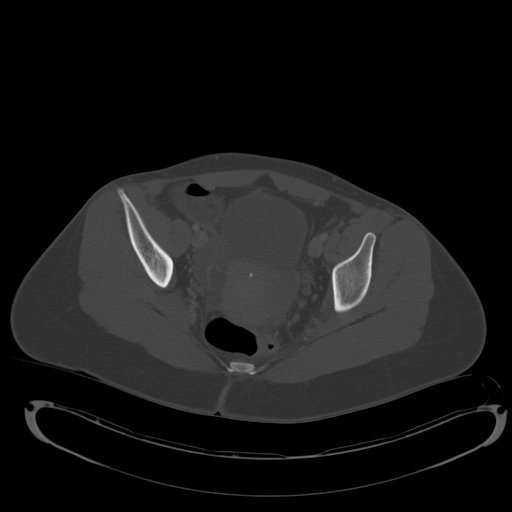
[im 45/66  soft-tissue]
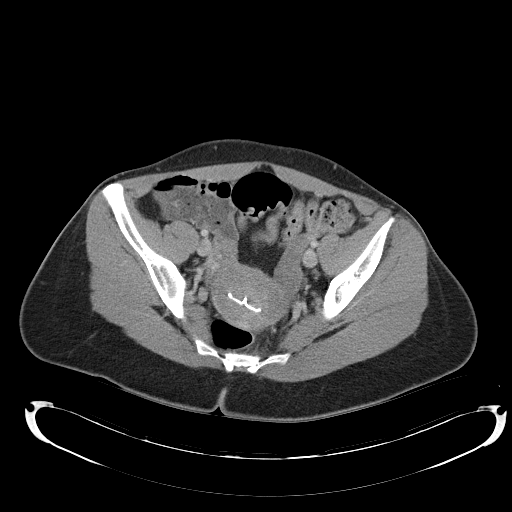
[im 49/66  soft-tissue]
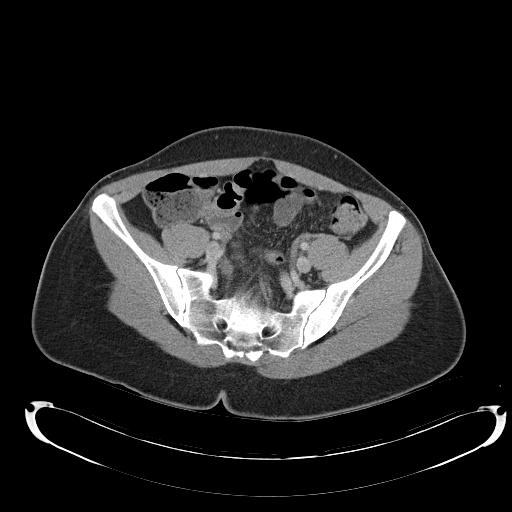
[im 53/66  soft-tissue]
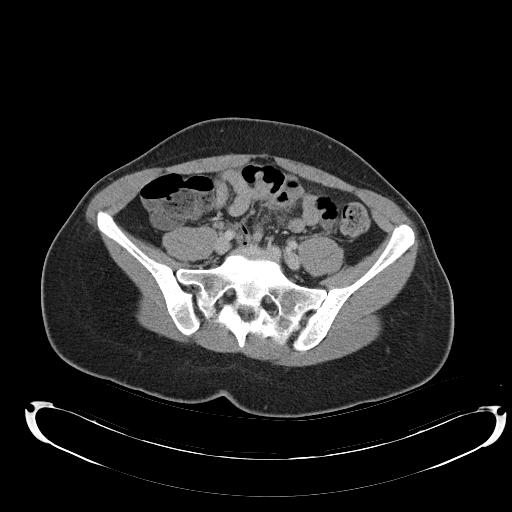
[im 57/66  soft-tissue]
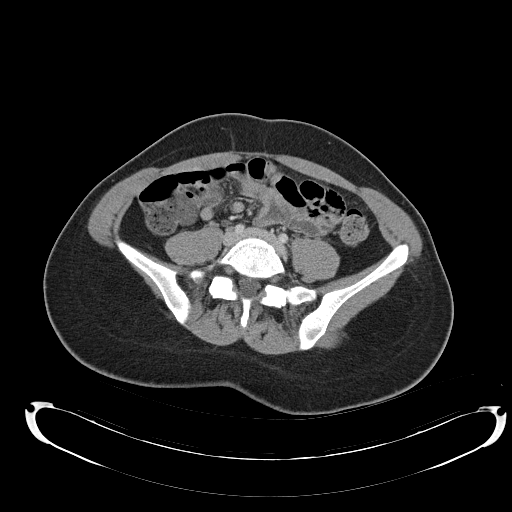
[im 61/66  soft-tissue]
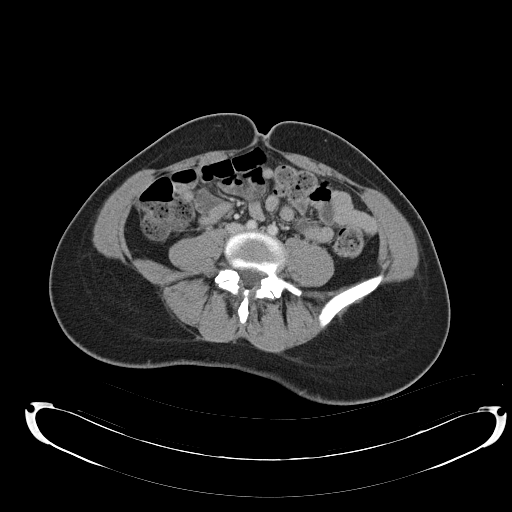

[Series 3: mpr coronal a/p · coronal · 0.66mm/px · 3 of 80 slices shown]
[im 27/80  soft-tissue]
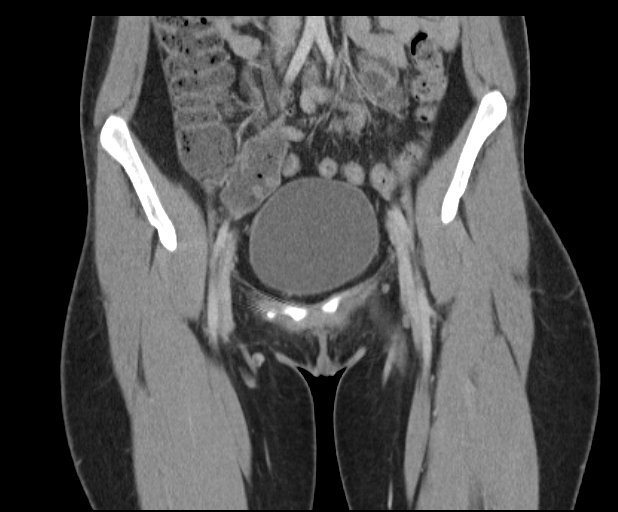
[im 36/80  soft-tissue]
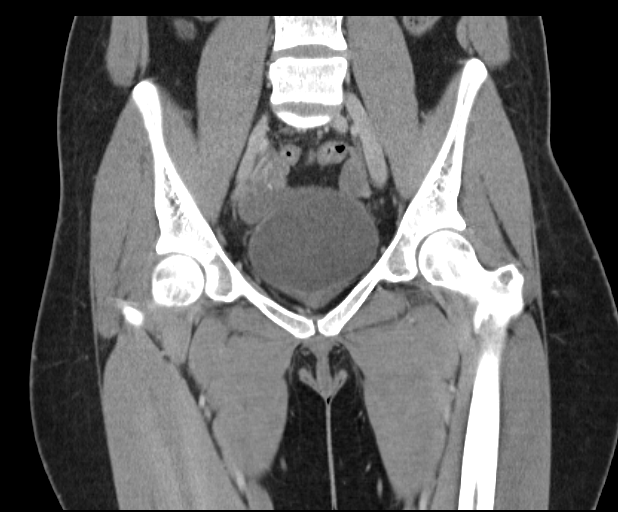
[im 44/80  soft-tissue]
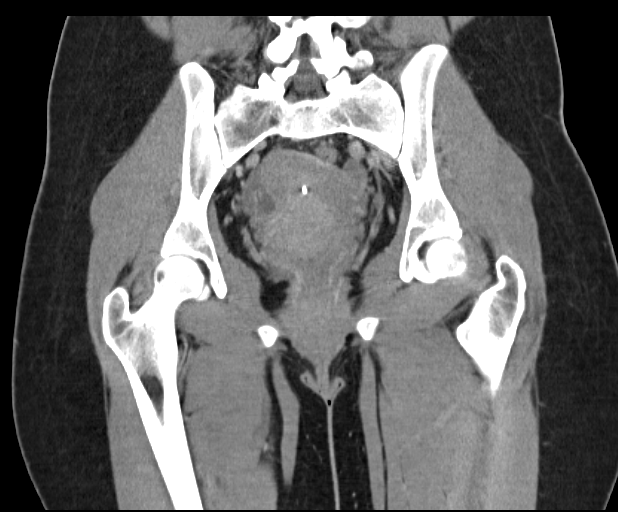

[17 of 46 positions shown; findings below may reference images not displayed]

FINDINGS: Small amount of subcutaneous stranding in the right
gluteal cleft (series 2/image 40).  No drainable fluid collection
or abscess.

Uterus is retroverted with IUD in satisfactory position.  Bilateral
ovaries are within normal limits.

Bladder is within normal limits.

Visualized bowel is unremarkable.

Trace pelvic ascites, likely physiologic.

No suspicious pelvic lymphadenopathy.

Visualized osseous structures are within normal limits.
IMPRESSION: Small amount of subcutaneous stranding in the right gluteal cleft,
likely corresponding to prior surgical wound.

No drainable fluid collection or abscess.

## 2012-04-28 MED ORDER — ONDANSETRON HCL 4 MG/2ML IJ SOLN
4.0000 mg | Freq: Once | INTRAMUSCULAR | Status: AC
  Administered 2012-04-28: 4 mg via INTRAVENOUS
  Filled 2012-04-28: qty 2

## 2012-04-28 MED ORDER — AMOXICILLIN-POT CLAVULANATE 875-125 MG PO TABS: 1.0000 | ORAL_TABLET | Freq: Two times a day (BID) | ORAL | Status: AC

## 2012-04-28 MED ORDER — IOHEXOL 300 MG/ML  SOLN
100.0000 mL | Freq: Once | INTRAMUSCULAR | Status: AC | PRN
  Administered 2012-04-28: 100 mL via INTRAVENOUS

## 2012-04-28 MED ORDER — ONDANSETRON HCL 4 MG PO TABS
4.0000 mg | ORAL_TABLET | Freq: Once | ORAL | Status: AC
  Administered 2012-04-28: 4 mg via ORAL
  Filled 2012-04-28: qty 1

## 2012-04-28 MED ORDER — HYDROMORPHONE HCL PF 1 MG/ML IJ SOLN
0.5000 mg | Freq: Once | INTRAMUSCULAR | Status: AC
  Administered 2012-04-28: 0.5 mg via INTRAVENOUS
  Filled 2012-04-28: qty 1

## 2012-04-28 MED ORDER — SODIUM CHLORIDE 0.9 % IV SOLN
Freq: Once | INTRAVENOUS | Status: AC
  Administered 2012-04-28: 10:00:00 via INTRAVENOUS

## 2012-04-28 MED ORDER — AMOXICILLIN-POT CLAVULANATE 875-125 MG PO TABS
1.0000 | ORAL_TABLET | Freq: Once | ORAL | Status: AC
  Administered 2012-04-28: 1 via ORAL
  Filled 2012-04-28: qty 1

## 2012-04-28 MED ORDER — OXYCODONE-ACETAMINOPHEN 5-325 MG PO TABS: 1.0000 | ORAL_TABLET | Freq: Four times a day (QID) | ORAL | Status: AC | PRN

## 2012-04-28 NOTE — ED Notes (Signed)
Beeped to [phone]. Karilyn Cota

## 2012-04-28 NOTE — Discharge Instructions (Signed)
Wound Dehiscence Wound dehiscence is when a surgical cut (incision) opens up. It usually happens 7 to 10 days after surgery. You may have pain, a fever, or have more fluid coming from the cut. It should be treated early. HOME CARE  Only take medicines as told by your doctor.   Take your medicines (antibiotics) as told. Finish them even if you start to feel better.   Wash your wound with warm, soapy water 2 times a day, or as told. Pat the wound dry. Do not rub the wound.   Change bandages (dressings) as often as told. Wash your hands before and after changing bandages. Apply bandages as told.   Take showers. Do not soak the wound, bathe, or swim until your wound is healed.   Avoid exercises that make you sweat.   Use medicines that stop itching as told by your doctor. The wound may itch as it heals. Do not pick or scratch at the wound.   Do not lift more than 10 pounds (4.5 kilograms) until the wound is healed, or as told by your doctor.   Keep all doctor visits as told.  GET HELP RIGHT AWAY IF:   You have more puffiness (swelling) or redness around the wound.   You have more pain in the wound.   You have yellowish white fluid (pus) coming from the wound.   More of the wound breaks open.   You have a fever.  MAKE SURE YOU:   Understand these instructions.   Will watch your condition.   Will get help right away if you are not doing well or get worse.  Document Released: 10/19/2009 Document Revised: 10/20/2011 Document Reviewed: 03/06/2011 [facility] Patient Information 2012 [place], [place].

## 2012-04-28 NOTE — ED Provider Notes (Signed)
History     CSN: [id number]  Arrival date & time 04/28/12  0909   First MD Initiated Contact with Patient 04/28/12 [phone]      Chief Complaint  Patient presents with  . Wound Check    (Consider location/radiation/quality/duration/timing/severity/associated sxs/prior treatment) HPI Comments: Patient states she fell in the bathroom on June 8 and sustained a laceration of the right buttocks extending near the perirectal area. The patient had surgical intervention by Dr. Leticia Penna. There was no compromise of the anal sphincter or anal muscles. On last evening the patient noted a" hole where the sutures had been." The patient presents to the emergency department for evaluation of this problem, and for assistance with the pain. There's been no high fevers. Nausea but no vomiting.  The history is provided by the patient.    Past Medical History  Diagnosis Date  . Anxiety   . Bipolar 1 disorder   . Anxiety     Past Surgical History  Procedure Date  . Abdominal surgery   . Examination under anesthesia 04/21/2012    Procedure: EXAM UNDER ANESTHESIA;  Surgeon: Fabio Bering, MD;  Location: [facility];  Service: General;  Laterality: N/A;  Gluteal wound repair    No family history on file.  History  Substance Use Topics  . Smoking status: Current Everyday Smoker -- 0.2 packs/day    Types: Cigarettes  . Smokeless tobacco: Not on file  . Alcohol Use: Yes     occasionally    OB History    Grav Para Term Preterm Abortions TAB SAB Ect Mult Living                  Review of Systems  Constitutional: Negative for activity change.       All ROS Neg except as noted in HPI  HENT: Negative for nosebleeds and neck pain.   Eyes: Negative for photophobia and discharge.  Respiratory: Positive for wheezing. Negative for cough and shortness of breath.   Cardiovascular: Negative for chest pain and palpitations.  Gastrointestinal: Positive for nausea. Negative for abdominal pain and blood in stool.   Genitourinary: Negative for dysuria, frequency and hematuria.  Musculoskeletal: Negative for back pain and arthralgias.  Skin: Positive for wound.  Neurological: Negative for dizziness, seizures and speech difficulty.  Psychiatric/Behavioral: Negative for hallucinations and confusion. The patient is nervous/anxious.     Allergies  Latex  Home Medications   Current Outpatient Rx  Name Route Sig Dispense Refill  . CLONAZEPAM 0.5 MG PO TABS Oral Take 0.5 mg by mouth 2 (two) times daily as needed. anxiety     . ESCITALOPRAM OXALATE 20 MG PO TABS Oral Take 20 mg by mouth daily.      Marland Kitchen LAMOTRIGINE 100 MG PO TABS Oral Take 100 mg by mouth daily.    Marland Kitchen LEVONORGESTREL 20 MCG/24HR IU IUD Intrauterine 1 each by Intrauterine route once.      BP 122/78  Pulse 104  Temp 99 F (37.2 C) (Oral)  Resp 18  Ht 5\' 1"  (1.549 m)  Wt 143 lb (64.864 kg)  BMI 27.02 kg/m2  SpO2 98%  LMP 04/18/2012  Physical Exam  Nursing note and vitals reviewed. Constitutional: She is oriented to person, place, and time. She appears well-developed and well-nourished.  Non-toxic appearance.  HENT:  Head: Normocephalic.  Right Ear: Tympanic membrane and external ear normal.  Left Ear: Tympanic membrane and external ear normal.  Eyes: EOM and lids are normal. Pupils are equal, round,  and reactive to light.  Neck: Normal range of motion. Neck supple. Carotid bruit is not present.  Cardiovascular: Regular rhythm, normal heart sounds, intact distal pulses and normal pulses.  Tachycardia present.   Pulmonary/Chest: Breath sounds normal. No respiratory distress.  Abdominal: Soft. Bowel sounds are normal. There is no tenderness. There is no guarding.  Genitourinary:       The wound to the right buttocks and. Rectal area has dehisced. There is minimal to moderate drainage present but no peritoneal or pus noted the area closest to the perirectal area remains intact. The surgical area is sore to palpation. No palpable abscess  appreciated. No palpable hematoma noted. Chaperone present during examination.  Musculoskeletal: Normal range of motion.  Lymphadenopathy:       Head (right side): No submandibular adenopathy present.       Head (left side): No submandibular adenopathy present.    She has no cervical adenopathy.  Neurological: She is alert and oriented to person, place, and time. She has normal strength. No cranial nerve deficit or sensory deficit.  Skin: Skin is warm and dry.  Psychiatric: Her speech is normal. Her mood appears anxious.    ED Course  Procedures (including critical care time)   Labs Reviewed  CBC  DIFFERENTIAL  BASIC METABOLIC PANEL   No results found.   No diagnosis found.    MDM  I have reviewed nursing notes, vital signs, and all appropriate lab and imaging results for this patient. Pt seen with me by Dr Clarene Duke. Case discussed with radiologist. CT pelvis with IV contrast ordered.  CT results discussed with the patient. Case discussed with Dr. Leticia Penna. He request that the patient allow the wound to heal naturally. Patient is to use cleansing with soap and water as well as tub soaks twice a day. Patient is to see Dr. Leticia Penna in the office on June 18. Prescription given for Augmentin 875 mg, and Percocet one every 6 hours.     Kathie Dike, [place] 04/28/12 1253

## 2012-04-28 NOTE — ED Notes (Addendum)
Patient with c/o wound check. Patient had stitches by Dr Suzette Battiest 04/21/12 for laceration to peri-rectal area. Stitches are intact, but wound dehiscence noted. C/o increased pain and shakiness. Patient reports symptoms started after having BM.

## 2012-04-29 NOTE — ED Provider Notes (Signed)
Medical screening examination/treatment/procedure(s) were performed by non-physician practitioner and as supervising physician I was immediately available for consultation/collaboration.   Laray Anger, DO 04/29/12 1620

## 2012-04-30 NOTE — Discharge Summary (Signed)
Physician Discharge Summary  Patient ID: Cristina Davenport MRN: [id number] DOB/AGE: 1986/06/17 26 y.o.  Admit date: 04/21/2012 Discharge date: 04/30/2012  Admission Diagnoses: Complex right gluteal laceration  Discharge Diagnoses: Status post repair right complex gluteal laceration. Active Problems:  * No active hospital problems. *    Discharged Condition: stable  Hospital Course: Cristina Davenport presented to [facility] emergency department earlier this morning following a fall in the shower. She had a tear caused by banding on the shower handle. Evaluation in the emergency department was concerning for possible involvement of the anal suture muscles. She was taken to the operating room for exam under anesthesia and closure the lacerations. The suture muscles were found to be intact. The laceration was closed with interrupted sutures. She was controlled on pain medication following and plans were made for discharge.  Consults: None  Significant Diagnostic Studies: labs: Complete blood count, basic metabolic panel  Treatments: surgery: Exam under anesthesia and leisure and debridement of intermediate laceration (gluteal)  Discharge Exam: Blood pressure 106/64, pulse 73, temperature 98.2 F (36.8 C), temperature source Oral, resp. rate 16, height 5' (1.524 m), weight 64.411 kg (142 lb), last menstrual period 04/18/2012, SpO2 98.00%. General appearance: alert and no distress Resp: clear to auscultation bilaterally Cardio: regular rate and rhythm Incision/Wound: some redness around the area. She is in place. Minimal drainage.  Disposition: 01-Home or Self Care  Discharge Orders    Future Orders Please Complete By Expires   Diet - low sodium heart healthy      Increase activity slowly      Discharge instructions      Comments:   Expect drainage   Discharge wound care:      Comments:   Alternate an anti-inflammatory such as ibuprofen (Motrin, Advil) 400-600mg  every 6 hours with the prescribed  pain medication.   Do not take any additional acetaminophen as there is Tylenol in the pain medication.   Call MD for:  temperature >100.4      Call MD for:  persistant nausea and vomiting      Call MD for:  severe uncontrolled pain        Medication List  As of 04/30/2012 11:25 AM   TAKE these medications         clonazePAM 0.5 MG tablet   Commonly known as: KLONOPIN   Take 0.5 mg by mouth 2 (two) times daily as needed. anxiety      escitalopram 20 MG tablet   Commonly known as: LEXAPRO   Take 20 mg by mouth daily.      lamoTRIgine 100 MG tablet   Commonly known as: LAMICTAL   Take 100 mg by mouth daily.      levonorgestrel 20 MCG/24HR IUD   Commonly known as: MIRENA   1 each by Intrauterine route once.           Follow-up Information    Follow up with Fabio Bering, MD on 04/26/2012.   Contact information:   [address] [place] [address] [phone]          Signed: Fabio Bering 04/30/2012, 11:25 AM

## 2013-02-21 ENCOUNTER — Encounter: Payer: Self-pay | Admitting: Nurse Practitioner

## 2013-02-21 ENCOUNTER — Encounter: Payer: Self-pay | Admitting: Family Medicine

## 2013-02-21 ENCOUNTER — Ambulatory Visit (INDEPENDENT_AMBULATORY_CARE_PROVIDER_SITE_OTHER): Payer: BC Managed Care – PPO | Admitting: Nurse Practitioner

## 2013-02-21 VITALS — BP 110/66 | Temp 98.6°F | Ht 61.0 in | Wt 148.1 lb

## 2013-02-21 DIAGNOSIS — N390 Urinary tract infection, site not specified: Secondary | ICD-10-CM

## 2013-02-21 DIAGNOSIS — R350 Frequency of micturition: Secondary | ICD-10-CM

## 2013-02-21 DIAGNOSIS — K2970 Gastritis, unspecified, without bleeding: Secondary | ICD-10-CM

## 2013-02-21 LAB — POCT URINALYSIS DIPSTICK
Blood, UA: 10
Nitrite, UA: POSITIVE
Protein, UA: 30
Spec Grav, UA: 1.01
pH, UA: 6

## 2013-02-21 MED ORDER — NITROFURANTOIN MONOHYD MACRO 100 MG PO CAPS: 100.0000 mg | ORAL_CAPSULE | Freq: Two times a day (BID) | ORAL | Status: AC

## 2013-02-21 MED ORDER — OMEPRAZOLE-SODIUM BICARBONATE 20-1680 MG PO PACK: PACK | ORAL | Status: DC

## 2013-02-21 NOTE — Patient Instructions (Signed)
Maalox or TUMS for acid refluxGastroesophageal Reflux Disease, Adult Gastroesophageal reflux disease (GERD) happens when acid from your stomach flows up into the esophagus. When acid comes in contact with the esophagus, the acid causes soreness (inflammation) in the esophagus. Over time, GERD may create small holes (ulcers) in the lining of the esophagus. CAUSES   Increased body weight. This puts pressure on the stomach, making acid rise from the stomach into the esophagus.  Smoking. This increases acid production in the stomach.  Drinking alcohol. This causes decreased pressure in the lower esophageal sphincter (valve or ring of muscle between the esophagus and stomach), allowing acid from the stomach into the esophagus.  Late evening meals and a full stomach. This increases pressure and acid production in the stomach.  A malformed lower esophageal sphincter. Sometimes, no cause is found. SYMPTOMS   Burning pain in the lower part of the mid-chest behind the breastbone and in the mid-stomach area. This may occur twice a week or more often.  Trouble swallowing.  Sore throat.  Dry cough.  Asthma-like symptoms including chest tightness, shortness of breath, or wheezing. DIAGNOSIS  Your caregiver may be able to diagnose GERD based on your symptoms. In some cases, X-rays and other tests may be done to check for complications or to check the condition of your stomach and esophagus. TREATMENT  Your caregiver may recommend over-the-counter or prescription medicines to help decrease acid production. Ask your caregiver before starting or adding any new medicines.  HOME CARE INSTRUCTIONS   Change the factors that you can control. Ask your caregiver for guidance concerning weight loss, quitting smoking, and alcohol consumption.  Avoid foods and drinks that make your symptoms worse, such as:  Caffeine or alcoholic drinks.  Chocolate.  Peppermint or mint flavorings.  Garlic and  onions.  Spicy foods.  Citrus fruits, such as oranges, lemons, or limes.  Tomato-based foods such as sauce, chili, salsa, and pizza.  Fried and fatty foods.  Avoid lying down for the 3 hours prior to your bedtime or prior to taking a nap.  Eat small, frequent meals instead of large meals.  Wear loose-fitting clothing. Do not wear anything tight around your waist that causes pressure on your stomach.  Raise the head of your bed 6 to 8 inches with wood blocks to help you sleep. Extra pillows will not help.  Only take over-the-counter or prescription medicines for pain, discomfort, or fever as directed by your caregiver.  Do not take aspirin, ibuprofen, or other nonsteroidal anti-inflammatory drugs (NSAIDs). SEEK IMMEDIATE MEDICAL CARE IF:   You have pain in your arms, neck, jaw, teeth, or back.  Your pain increases or changes in intensity or duration.  You develop nausea, vomiting, or sweating (diaphoresis).  You develop shortness of breath, or you faint.  Your vomit is green, yellow, black, or looks like coffee grounds or blood.  Your stool is red, bloody, or black. These symptoms could be signs of other problems, such as heart disease, gastric bleeding, or esophageal bleeding. MAKE SURE YOU:   Understand these instructions.  Will watch your condition.  Will get help right away if you are not doing well or get worse. Document Released: 08/10/2005 Document Revised: 01/23/2012 Document Reviewed: 05/20/2011 [facility] Patient Information 2013 [place], [place].

## 2013-02-23 ENCOUNTER — Encounter: Payer: Self-pay | Admitting: Nurse Practitioner

## 2013-02-23 DIAGNOSIS — K2970 Gastritis, unspecified, without bleeding: Secondary | ICD-10-CM | POA: Insufficient documentation

## 2013-02-23 DIAGNOSIS — N390 Urinary tract infection, site not specified: Secondary | ICD-10-CM | POA: Insufficient documentation

## 2013-02-23 LAB — POCT UA - MICROSCOPIC ONLY
RBC, urine, microscopic: 0
WBC, Ur, HPF, POC: 5

## 2013-02-23 NOTE — Progress Notes (Signed)
Subjective:  Presents for several issues. Has had epigastric area pain off-and-on for the past 2 months. Describes as a constant discomfort with occasional stabbing which will radiate across the upper abdomen into the mid back area. No acid reflux or heartburn. Occurs about every 2 weeks, can last for several days. Decreased appetite. Unassociated with meals or particular foods. Nausea but no vomiting.  Pain is dull today. Has not identified any specific triggers. Bowels normal limit. No relief with Tylenol Aleve or pain medication. Some urinary frequency urgency pressure, voiding small amounts. No fevers but has felt cold at times. Some mid pelvic area discomfort, no back pain today. No new sexual partners. No vaginal discharge. Has been off her omeprazole for reflux for awhile. No caffeine intake. Smokes one cigarette per day. Rare social alcohol use. Has Mirena for birth control. Has had some off-and-on spotting.  Objective:   BP 110/66  Temp(Src) 98.6 F (37 C) (Oral)  Ht 5\' 1"  (1.549 m)  Wt 148 lb 2 oz (67.189 kg)  BMI 28 kg/m2 NAD. Alert, oriented. Lungs clear. Heart regular rate rhythm. No CVA tenderness. Abdomen soft nondistended with distinct epigastric area tenderness on exam, no masses or obvious organomegaly. Also mild suprapubic area tenderness on exam. Urine microscopic 5+ WBCs per HPF with some bacteria.

## 2013-02-23 NOTE — Assessment & Plan Note (Addendum)
Restart omeprazole/sodium bicarbonate daily. OTC meds such as Maalox or TUMS for breakthrough symptoms. Reminded about lifestyle factors affecting her symptoms. Callback in 7-10 days if no improvement, sooner if worse.

## 2013-02-23 NOTE — Assessment & Plan Note (Addendum)
Macrobid as directed. Increase clear fluid intake. Call back in 4-5 days if no improvement, sooner if worse. May use AZO for 48 hours then DC. Call back if symptoms persist.

## 2013-02-24 ENCOUNTER — Other Ambulatory Visit: Payer: Self-pay | Admitting: Family Medicine

## 2013-04-01 ENCOUNTER — Other Ambulatory Visit: Payer: Self-pay | Admitting: Nurse Practitioner

## 2013-04-05 ENCOUNTER — Encounter: Payer: Self-pay | Admitting: Nurse Practitioner

## 2013-04-05 ENCOUNTER — Ambulatory Visit (INDEPENDENT_AMBULATORY_CARE_PROVIDER_SITE_OTHER): Payer: BC Managed Care – PPO | Admitting: Nurse Practitioner

## 2013-04-05 ENCOUNTER — Encounter: Payer: Self-pay | Admitting: Family Medicine

## 2013-04-05 VITALS — BP 119/72 | Temp 99.0°F | Wt 143.4 lb

## 2013-04-05 DIAGNOSIS — K2990 Gastroduodenitis, unspecified, without bleeding: Secondary | ICD-10-CM

## 2013-04-05 DIAGNOSIS — K2970 Gastritis, unspecified, without bleeding: Secondary | ICD-10-CM

## 2013-04-05 MED ORDER — PANTOPRAZOLE SODIUM 40 MG PO TBEC: 40.0000 mg | DELAYED_RELEASE_TABLET | Freq: Every day | ORAL | Status: DC

## 2013-04-05 MED ORDER — ONDANSETRON 8 MG PO TBDP: 8.0000 mg | ORAL_TABLET | Freq: Three times a day (TID) | ORAL | Status: DC | PRN

## 2013-04-05 MED ORDER — SUCRALFATE 1 G PO TABS: ORAL_TABLET | ORAL | Status: DC

## 2013-04-05 NOTE — Patient Instructions (Signed)
Gastritis, Adult Gastritis is soreness and swelling (inflammation) of the lining of the stomach. Gastritis can develop as a sudden onset (acute) or long-term (chronic) condition. If gastritis is not treated, it can lead to stomach bleeding and ulcers. CAUSES  Gastritis occurs when the stomach lining is weak or damaged. Digestive juices from the stomach then inflame the weakened stomach lining. The stomach lining may be weak or damaged due to viral or bacterial infections. One common bacterial infection is the Helicobacter pylori infection. Gastritis can also result from excessive alcohol consumption, taking certain medicines, or having too much acid in the stomach.  SYMPTOMS  In some cases, there are no symptoms. When symptoms are present, they may include:  Pain or a burning sensation in the upper abdomen.  Nausea.  Vomiting.  An uncomfortable feeling of fullness after eating. DIAGNOSIS  Your caregiver may suspect you have gastritis based on your symptoms and a physical exam. To determine the cause of your gastritis, your caregiver may perform the following:  Blood or stool tests to check for the H pylori bacterium.  Gastroscopy. A thin, flexible tube (endoscope) is passed down the esophagus and into the stomach. The endoscope has a light and camera on the end. Your caregiver uses the endoscope to view the inside of the stomach.  Taking a tissue sample (biopsy) from the stomach to examine under a microscope. TREATMENT  Depending on the cause of your gastritis, medicines may be prescribed. If you have a bacterial infection, such as an H pylori infection, antibiotics may be given. If your gastritis is caused by too much acid in the stomach, H2 blockers or antacids may be given. Your caregiver may recommend that you stop taking aspirin, ibuprofen, or other nonsteroidal anti-inflammatory drugs (NSAIDs). HOME CARE INSTRUCTIONS  Only take over-the-counter or prescription medicines as directed by  your caregiver.  If you were given antibiotic medicines, take them as directed. Finish them even if you start to feel better.  Drink enough fluids to keep your urine clear or pale yellow.  Avoid foods and drinks that make your symptoms worse, such as:  Caffeine or alcoholic drinks.  Chocolate.  Peppermint or mint flavorings.  Garlic and onions.  Spicy foods.  Citrus fruits, such as oranges, lemons, or limes.  Tomato-based foods such as sauce, chili, salsa, and pizza.  Fried and fatty foods.  Eat small, frequent meals instead of large meals. SEEK IMMEDIATE MEDICAL CARE IF:   You have black or dark red stools.  You vomit blood or material that looks like coffee grounds.  You are unable to keep fluids down.  Your abdominal pain gets worse.  You have a fever.  You do not feel better after 1 week.  You have any other questions or concerns. MAKE SURE YOU:  Understand these instructions.  Will watch your condition.  Will get help right away if you are not doing well or get worse. Document Released: 10/25/2001 Document Revised: 05/01/2012 Document Reviewed: 12/14/2011 [facility] Patient Information 2014 [place], [place]. Gastroesophageal Reflux Disease, Adult Gastroesophageal reflux disease (GERD) happens when acid from your stomach flows up into the esophagus. When acid comes in contact with the esophagus, the acid causes soreness (inflammation) in the esophagus. Over time, GERD may create small holes (ulcers) in the lining of the esophagus. CAUSES   Increased body weight. This puts pressure on the stomach, making acid rise from the stomach into the esophagus.  Smoking. This increases acid production in the stomach.  Drinking alcohol. This causes  decreased pressure in the lower esophageal sphincter (valve or ring of muscle between the esophagus and stomach), allowing acid from the stomach into the esophagus.  Late evening meals and a full stomach. This increases  pressure and acid production in the stomach.  A malformed lower esophageal sphincter. Sometimes, no cause is found. SYMPTOMS   Burning pain in the lower part of the mid-chest behind the breastbone and in the mid-stomach area. This may occur twice a week or more often.  Trouble swallowing.  Sore throat.  Dry cough.  Asthma-like symptoms including chest tightness, shortness of breath, or wheezing. DIAGNOSIS  Your caregiver may be able to diagnose GERD based on your symptoms. In some cases, X-rays and other tests may be done to check for complications or to check the condition of your stomach and esophagus. TREATMENT  Your caregiver may recommend over-the-counter or prescription medicines to help decrease acid production. Ask your caregiver before starting or adding any new medicines.  HOME CARE INSTRUCTIONS   Change the factors that you can control. Ask your caregiver for guidance concerning weight loss, quitting smoking, and alcohol consumption.  Avoid foods and drinks that make your symptoms worse, such as:  Caffeine or alcoholic drinks.  Chocolate.  Peppermint or mint flavorings.  Garlic and onions.  Spicy foods.  Citrus fruits, such as oranges, lemons, or limes.  Tomato-based foods such as sauce, chili, salsa, and pizza.  Fried and fatty foods.  Avoid lying down for the 3 hours prior to your bedtime or prior to taking a nap.  Eat small, frequent meals instead of large meals.  Wear loose-fitting clothing. Do not wear anything tight around your waist that causes pressure on your stomach.  Raise the head of your bed 6 to 8 inches with wood blocks to help you sleep. Extra pillows will not help.  Only take over-the-counter or prescription medicines for pain, discomfort, or fever as directed by your caregiver.  Do not take aspirin, ibuprofen, or other nonsteroidal anti-inflammatory drugs (NSAIDs). SEEK IMMEDIATE MEDICAL CARE IF:   You have pain in your arms, neck,  jaw, teeth, or back.  Your pain increases or changes in intensity or duration.  You develop nausea, vomiting, or sweating (diaphoresis).  You develop shortness of breath, or you faint.  Your vomit is green, yellow, black, or looks like coffee grounds or blood.  Your stool is red, bloody, or black. These symptoms could be signs of other problems, such as heart disease, gastric bleeding, or esophageal bleeding. MAKE SURE YOU:   Understand these instructions.  Will watch your condition.  Will get help right away if you are not doing well or get worse. Document Released: 08/10/2005 Document Revised: 01/23/2012 Document Reviewed: 05/20/2011 [facility] Patient Information 2014 [place], [place].

## 2013-04-08 ENCOUNTER — Encounter: Payer: Self-pay | Admitting: Nurse Practitioner

## 2013-04-08 NOTE — Progress Notes (Signed)
Subjective:  Presents for complaints of nausea and vomiting for the past 5 days. Has had 4 episodes of vomiting this morning. No fever. No cough. No diarrhea constipation or blood in her stool. Stools normal color. Mild generalized headache. Muscle aches. Distinct pain in the epigastric area. Vomiting occurs after she eats anything. Unassociated with any particular foods. Has been able to keep down some fluids. Voiding normal limit. No appetite. Has a history of gastritis. Ran out of her Zegerid 2 weeks ago. No caffeine. Smokes one third pack per day. Rare social alcohol. No excessive NSAID use. Has been under a lot of stress.  Objective:   BP 119/72  Temp(Src) 99 F (37.2 C)  Wt 143 lb 6.4 oz (65.046 kg)  BMI 27.11 kg/m2 NAD. Alert, oriented. Mildly fatigued in appearance. TMs normal limit. Pharynx clear. Neck supple with minimal adenopathy. Lungs clear. Heart regular rate rhythm. Abdomen soft nondistended with distinct epigastric area tenderness. Hemoglobin normal.  Assessment:Gastritis - Plan: POCT hemoglobin, Ambulatory referral to Gastroenterology  Plan: Meds ordered this encounter  Medications  . pantoprazole (PROTONIX) 40 MG tablet    Sig: Take 1 tablet (40 mg total) by mouth daily.    Dispense:  30 tablet    Refill:  5    Order Specific Question:  Supervising Provider    Answer:  Merlyn Albert [[id number]]  . ondansetron (ZOFRAN-ODT) 8 MG disintegrating tablet    Sig: Take 1 tablet (8 mg total) by mouth every 8 (eight) hours as needed for nausea.    Dispense:  30 tablet    Refill:  0    Order Specific Question:  Supervising Provider    Answer:  Merlyn Albert [[id number]]  . sucralfate (CARAFATE) 1 G tablet    Sig: One po AC and HS prn abd pain    Dispense:  40 tablet    Refill:  0    Order Specific Question:  Supervising Provider    Answer:  Merlyn Albert [[id number]]   call back next week if no improvement, sooner if worse. Warning signs reviewed.

## 2013-04-08 NOTE — Assessment & Plan Note (Signed)
Assessment:Gastritis - Plan: POCT hemoglobin, Ambulatory referral to Gastroenterology  Plan: Meds ordered this encounter  Medications  . pantoprazole (PROTONIX) 40 MG tablet    Sig: Take 1 tablet (40 mg total) by mouth daily.    Dispense:  30 tablet    Refill:  5    Order Specific Question:  Supervising Provider    Answer:  Merlyn Albert [[id number]]  . ondansetron (ZOFRAN-ODT) 8 MG disintegrating tablet    Sig: Take 1 tablet (8 mg total) by mouth every 8 (eight) hours as needed for nausea.    Dispense:  30 tablet    Refill:  0    Order Specific Question:  Supervising Provider    Answer:  Merlyn Albert [[id number]]  . sucralfate (CARAFATE) 1 G tablet    Sig: One po AC and HS prn abd pain    Dispense:  40 tablet    Refill:  0    Order Specific Question:  Supervising Provider    Answer:  Merlyn Albert [[id number]]   call back next week if no improvement, sooner if worse. Warning signs reviewed.

## 2013-04-18 ENCOUNTER — Ambulatory Visit: Payer: 59 | Admitting: Gastroenterology

## 2013-04-23 ENCOUNTER — Telehealth: Payer: Self-pay | Admitting: Family Medicine

## 2013-04-23 NOTE — Telephone Encounter (Signed)
Patient was diagnosed with gastritis and has been referred to a gastro specialist, but patient hasnt been able to get in with them yet.  She went to work today and threw up in front of a patient and she states that her work place is very strict and sent her on.  She would like a note excusing her from work today explaining the situation.

## 2013-04-23 NOTE — Telephone Encounter (Signed)
ok 

## 2013-04-24 ENCOUNTER — Encounter: Payer: Self-pay | Admitting: Nurse Practitioner

## 2013-04-25 ENCOUNTER — Ambulatory Visit (INDEPENDENT_AMBULATORY_CARE_PROVIDER_SITE_OTHER): Payer: BC Managed Care – PPO | Admitting: Gastroenterology

## 2013-04-25 ENCOUNTER — Encounter: Payer: Self-pay | Admitting: Gastroenterology

## 2013-04-25 VITALS — BP 124/74 | HR 79 | Temp 98.2°F | Ht 61.0 in | Wt 146.2 lb

## 2013-04-25 DIAGNOSIS — R1013 Epigastric pain: Secondary | ICD-10-CM

## 2013-04-25 DIAGNOSIS — R112 Nausea with vomiting, unspecified: Secondary | ICD-10-CM

## 2013-04-25 LAB — CBC WITH DIFFERENTIAL/PLATELET
Basophils Absolute: 0 10*3/uL (ref 0.0–0.1)
Basophils Relative: 0 % (ref 0–1)
Eosinophils Absolute: 0.2 10*3/uL (ref 0.0–0.7)
Eosinophils Relative: 2 % (ref 0–5)
HCT: 41.5 % (ref 36.0–46.0)
Hemoglobin: 14 g/dL (ref 12.0–15.0)
Lymphocytes Relative: 22 % (ref 12–46)
Lymphs Abs: 2.1 10*3/uL (ref 0.7–4.0)
MCH: 30.9 pg (ref 26.0–34.0)
MCHC: 33.7 g/dL (ref 30.0–36.0)
MCV: 91.6 fL (ref 78.0–100.0)
Monocytes Absolute: 0.8 10*3/uL (ref 0.1–1.0)
Monocytes Relative: 9 % (ref 3–12)
Neutro Abs: 6.1 10*3/uL (ref 1.7–7.7)
Neutrophils Relative %: 66 % (ref 43–77)
Platelets: 243 10*3/uL (ref 150–400)
RBC: 4.53 MIL/uL (ref 3.87–5.11)
RDW: 13.3 % (ref 11.5–15.5)
WBC: 9.2 10*3/uL (ref 4.0–10.5)

## 2013-04-25 LAB — HEPATIC FUNCTION PANEL
ALT: 12 U/L (ref 0–35)
AST: 17 U/L (ref 0–37)
Albumin: 4 g/dL (ref 3.5–5.2)
Alkaline Phosphatase: 75 U/L (ref 39–117)
Bilirubin, Direct: 0.1 mg/dL (ref 0.0–0.3)
Indirect Bilirubin: 0.3 mg/dL (ref 0.0–0.9)
Total Bilirubin: 0.4 mg/dL (ref 0.3–1.2)
Total Protein: 6.6 g/dL (ref 6.0–8.3)

## 2013-04-25 LAB — PREGNANCY, URINE: Preg Test, Ur: NEGATIVE

## 2013-04-25 LAB — LIPASE: Lipase: 29 U/L (ref 11–59)

## 2013-04-25 NOTE — Progress Notes (Signed)
Referring Provider: Luking, Scott A, MD Primary Care Physician:  LUKING,SCOTT, MD Primary Gastroenterologist:  Dr. Rourk   Chief Complaint  Patient presents with  . Abdominal Pain  . Emesis  . Nausea    HPI:   Ms. Cristina Davenport is a pleasant 27-year-old female presenting from Dr. Luking's office as a consultation secondary to abdominal pain, nausea, and vomiting. Symptoms present for about one month. Notes acute onset of epigastric pain, constant, radiating to back at times. Placed on Protonix without much improvement.  States constantly throwing up for the past month. Up to 10-15 times per day. No appetite. For a week only had 2 bananas. Water comes back up. Has good days. Onset several months ago. Worse if eating, specifically with large meal. Feels like entire abdomen becomes bloated.   BM up to 5 times per day, sometimes loose, sometimes not. However, this started when she was placed on the PPI.  Sometimes rectal bleeding, thinks may be related to prior gash in her buttock from falling in shower. No melena. Used to take Aleve about a bottle to bottle and a half a day when pain first started. No specific weight loss. Past few days, feels like she can't swallow. Has a ball in her throat.   Past Medical History  Diagnosis Date  . Anxiety   . Bipolar 1 disorder   . Anxiety   . GERD (gastroesophageal reflux disease)     Past Surgical History  Procedure Laterality Date  . Abdominal surgery      cyst removed   . Examination under anesthesia  04/21/2012    Gluteal wound repair    Current Outpatient Prescriptions  Medication Sig Dispense Refill  . clonazePAM (KLONOPIN) 0.5 MG tablet Take 0.5 mg by mouth 2 (two) times daily as needed. anxiety       . escitalopram (LEXAPRO) 20 MG tablet TAKE ONE TABLET BY MOUTH ONCE DAILY  30 tablet  2  . lamoTRIgine (LAMICTAL) 100 MG tablet Take 100 mg by mouth daily.      . levonorgestrel (MIRENA) 20 MCG/24HR IUD 1 each by Intrauterine route once.      .  ondansetron (ZOFRAN-ODT) 8 MG disintegrating tablet Take 1 tablet (8 mg total) by mouth every 8 (eight) hours as needed for nausea.  30 tablet  0  . pantoprazole (PROTONIX) 40 MG tablet Take 1 tablet (40 mg total) by mouth daily.  30 tablet  5  . sucralfate (CARAFATE) 1 G tablet One po AC and HS prn abd pain  40 tablet  0   No current facility-administered medications for this visit.    Allergies as of 04/25/2013 - Review Complete 04/25/2013  Allergen Reaction Noted  . Latex Other (See Comments) 04/21/2012    Family History  Problem Relation Age of Onset  . Hypertension Maternal Grandmother   . Stroke Maternal Grandmother   . Hypertension Maternal Grandfather   . Stroke Maternal Grandfather   . Crohn's disease Maternal Aunt   . Crohn's disease Cousin   . Colon cancer Neg Hx     History   Social History  . Marital Status: Married    Spouse Name: N/A    Number of Children: N/A  . Years of Education: N/A   Occupational History  . [employer]    Social History Main Topics  . Smoking status: Current Every Day Smoker -- 0.25 packs/day    Types: Cigarettes  . Smokeless tobacco: Not on file     Comment: in process   of quitting  . Alcohol Use: Yes     Comment: occasionally  . Drug Use: No  . Sexually Active: Yes    Birth Control/ Protection: IUD   Other Topics Concern  . Not on file   Social History Narrative  . No narrative on file    Review of Systems: Negative unless mentioned in HPI.   Physical Exam: BP 124/74  Pulse 79  Temp(Src) 98.2 F (36.8 C) (Oral)  Ht 5' 1" (1.549 m)  Wt 146 lb 3.2 oz (66.316 kg)  BMI 27.64 kg/m2 General:   Alert and oriented. Well-developed, well-nourished, pleasant and cooperative. Head:  Normocephalic and atraumatic. Eyes:  Conjunctiva pink, sclera clear, no icterus.   Conjunctiva pink. Ears:  Normal auditory acuity. Nose:  No deformity, discharge,  or lesions. Mouth:  No deformity or lesions, mucosa pink and moist.   Neck:  Supple, without mass or thyromegaly. Lungs:  Clear to auscultation bilaterally, without wheezing, rales, or rhonchi.  Heart:  S1, S2 present without murmurs noted.  Abdomen:  +BS, soft, sore to palpation upper abdomen and non-distended. Without mass or HSM. No rebound or guarding. No hernias noted. Rectal:  Deferred  Msk:  Symmetrical without gross deformities. Normal posture. Extremities:  Without clubbing or edema. Neurologic:  Alert and  oriented x4;  grossly normal neurologically. Skin:  Intact, warm and dry without significant lesions or rashes Cervical Nodes:  No significant cervical adenopathy. Psych:  Alert and cooperative. Normal mood and affect.   

## 2013-04-25 NOTE — Patient Instructions (Addendum)
Please have blood work done as soon as possible.   For now, we are planning on an upper endoscopy with Dr. Jena Gauss in the near future.   Avoid any medication that contains Ibuprofen, Advil, Aleve, Motrin.   Depending on your blood work, we may need to take a different route. For now, we are planning on an upper endoscopy.

## 2013-04-26 DIAGNOSIS — R1013 Epigastric pain: Secondary | ICD-10-CM | POA: Insufficient documentation

## 2013-04-26 DIAGNOSIS — R112 Nausea with vomiting, unspecified: Secondary | ICD-10-CM | POA: Insufficient documentation

## 2013-04-26 NOTE — Assessment & Plan Note (Signed)
27 year old female with acute onset of epigastric pain approximately a month ago, waxing and waning in intensity, associated with nausea and vomiting. Notes some days she vomits constantly. Decreased appetite, with worsening of symptoms eating. No melena noted, but she does report intermittent hematochezia. However, she feels this is secondary to friable tissue in her gluteal area that was repaired after a fall last year. Softer bowel movements, more frequent since starting a PPI. Interestingly, she reports using a bottle a day of Ibuprofen after symptoms started, without relief. No melena. Gallbladder remains in situ. Differentials include gastritis, PUD, biliary process, highly doubt pancreatitis. She will at some point possibly need a lower GI evaluation, but I do not feel she could tolerate the prep due to acute upper GI symptoms. Currently, needs further assessment with upper endoscopy in near future. Will check labs and anticipate [place] of abdomen if EGD is negative.   Proceed with upper endoscopy in the near future with Dr. Jena Gauss. The risks, benefits, and alternatives have been discussed in detail with patient. They have stated understanding and desire to proceed.  CBC, HFP, Lipase  Urine pregnancy screen [place] of abdomen if negative EGD Avoid all NSAIDS

## 2013-04-26 NOTE — Assessment & Plan Note (Signed)
EGD as planned, possible [place] of abd in near future.

## 2013-04-29 ENCOUNTER — Encounter (HOSPITAL_COMMUNITY): Payer: Self-pay | Admitting: Pharmacy Technician

## 2013-04-29 ENCOUNTER — Encounter: Payer: Self-pay | Admitting: *Deleted

## 2013-04-29 NOTE — Progress Notes (Signed)
Cc PCP 

## 2013-04-29 NOTE — Progress Notes (Signed)
Quick Note:  NEGATIVE pregnancy test. CBC, HFP, and lipase are all normal. Patient is scheduled for EGD this week.  Anticipate [place] of abdomen if negative EGD. ______

## 2013-04-30 ENCOUNTER — Encounter (HOSPITAL_COMMUNITY): Payer: Self-pay | Admitting: *Deleted

## 2013-04-30 ENCOUNTER — Encounter (HOSPITAL_COMMUNITY)
Admission: RE | Disposition: A | Discharge status: Home / Self Care | Payer: Self-pay | Source: Ambulatory Visit | Attending: Internal Medicine

## 2013-04-30 ENCOUNTER — Ambulatory Visit (HOSPITAL_COMMUNITY)
Admission: RE | Admit: 2013-04-30 | Discharge: 2013-04-30 | Disposition: A | Discharge status: Home / Self Care | Payer: BC Managed Care – PPO | Source: Ambulatory Visit | Attending: Internal Medicine | Admitting: Internal Medicine

## 2013-04-30 DIAGNOSIS — R109 Unspecified abdominal pain: Secondary | ICD-10-CM | POA: Insufficient documentation

## 2013-04-30 DIAGNOSIS — K2289 Other specified disease of esophagus: Secondary | ICD-10-CM

## 2013-04-30 DIAGNOSIS — K228 Other specified diseases of esophagus: Secondary | ICD-10-CM

## 2013-04-30 DIAGNOSIS — K449 Diaphragmatic hernia without obstruction or gangrene: Secondary | ICD-10-CM | POA: Insufficient documentation

## 2013-04-30 DIAGNOSIS — R1013 Epigastric pain: Secondary | ICD-10-CM

## 2013-04-30 DIAGNOSIS — R112 Nausea with vomiting, unspecified: Secondary | ICD-10-CM

## 2013-04-30 HISTORY — PX: ESOPHAGOGASTRODUODENOSCOPY: SHX5428

## 2013-04-30 SURGERY — EGD (ESOPHAGOGASTRODUODENOSCOPY)
Anesthesia: Moderate Sedation

## 2013-04-30 MED ORDER — MIDAZOLAM HCL 5 MG/5ML IJ SOLN
INTRAMUSCULAR | Status: AC
  Filled 2013-04-30: qty 10

## 2013-04-30 MED ORDER — SODIUM CHLORIDE 0.9 % IJ SOLN
INTRAMUSCULAR | Status: AC
  Filled 2013-04-30: qty 10

## 2013-04-30 MED ORDER — ONDANSETRON HCL 4 MG/2ML IJ SOLN
INTRAMUSCULAR | Status: AC
  Filled 2013-04-30: qty 2

## 2013-04-30 MED ORDER — SODIUM CHLORIDE 0.9 % IV SOLN
INTRAVENOUS | Status: DC
  Administered 2013-04-30: 14:00:00 via INTRAVENOUS

## 2013-04-30 MED ORDER — MEPERIDINE HCL 100 MG/ML IJ SOLN
INTRAMUSCULAR | Status: DC | PRN
  Administered 2013-04-30 (×2): 50 mg via INTRAVENOUS

## 2013-04-30 MED ORDER — MEPERIDINE HCL 100 MG/ML IJ SOLN
INTRAMUSCULAR | Status: AC
  Filled 2013-04-30: qty 2

## 2013-04-30 MED ORDER — MIDAZOLAM HCL 5 MG/5ML IJ SOLN
INTRAMUSCULAR | Status: DC | PRN
  Administered 2013-04-30: 2 mg via INTRAVENOUS
  Administered 2013-04-30 (×2): 1 mg via INTRAVENOUS
  Administered 2013-04-30: 2 mg via INTRAVENOUS

## 2013-04-30 MED ORDER — STERILE WATER FOR IRRIGATION IR SOLN
Status: DC | PRN
  Administered 2013-04-30: 15:00:00

## 2013-04-30 MED ORDER — PROMETHAZINE HCL 25 MG/ML IJ SOLN
INTRAMUSCULAR | Status: AC
  Filled 2013-04-30: qty 1

## 2013-04-30 MED ORDER — ONDANSETRON HCL 4 MG/2ML IJ SOLN
INTRAMUSCULAR | Status: DC | PRN
  Administered 2013-04-30: 4 mg via INTRAVENOUS

## 2013-04-30 MED ORDER — PROMETHAZINE HCL 25 MG/ML IJ SOLN
25.0000 mg | Freq: Once | INTRAMUSCULAR | Status: AC
  Administered 2013-04-30: 25 mg via INTRAVENOUS

## 2013-04-30 NOTE — H&P (View-Only) (Signed)
Referring Provider: Babs Sciara, MD Primary Care Physician:  Lilyan Punt, MD Primary Gastroenterologist:  Dr. Jena Gauss   Chief Complaint  Patient presents with  . Abdominal Pain  . Emesis  . Nausea    HPI:   Ms. Cristina Davenport is a pleasant 27 year old female presenting from Dr. Fletcher Anon office as a consultation secondary to abdominal pain, nausea, and vomiting. Symptoms present for about one month. Notes acute onset of epigastric pain, constant, radiating to back at times. Placed on Protonix without much improvement.  States constantly throwing up for the past month. Up to 10-15 times per day. No appetite. For a week only had 2 bananas. Water comes back up. Has good days. Onset several months ago. Worse if eating, specifically with large meal. Feels like entire abdomen becomes bloated.   BM up to 5 times per day, sometimes loose, sometimes not. However, this started when she was placed on the PPI.  Sometimes rectal bleeding, thinks may be related to prior gash in her buttock from falling in shower. No melena. Used to take Aleve about a bottle to bottle and a half a day when pain first started. No specific weight loss. Past few days, feels like she can't swallow. Has a ball in her throat.   Past Medical History  Diagnosis Date  . Anxiety   . Bipolar 1 disorder   . Anxiety   . GERD (gastroesophageal reflux disease)     Past Surgical History  Procedure Laterality Date  . Abdominal surgery      cyst removed   . Examination under anesthesia  04/21/2012    Gluteal wound repair    Current Outpatient Prescriptions  Medication Sig Dispense Refill  . clonazePAM (KLONOPIN) 0.5 MG tablet Take 0.5 mg by mouth 2 (two) times daily as needed. anxiety       . escitalopram (LEXAPRO) 20 MG tablet TAKE ONE TABLET BY MOUTH ONCE DAILY  30 tablet  2  . lamoTRIgine (LAMICTAL) 100 MG tablet Take 100 mg by mouth daily.      Marland Kitchen levonorgestrel (MIRENA) 20 MCG/24HR IUD 1 each by Intrauterine route once.      .  ondansetron (ZOFRAN-ODT) 8 MG disintegrating tablet Take 1 tablet (8 mg total) by mouth every 8 (eight) hours as needed for nausea.  30 tablet  0  . pantoprazole (PROTONIX) 40 MG tablet Take 1 tablet (40 mg total) by mouth daily.  30 tablet  5  . sucralfate (CARAFATE) 1 G tablet One po AC and HS prn abd pain  40 tablet  0   No current facility-administered medications for this visit.    Allergies as of 04/25/2013 - Review Complete 04/25/2013  Allergen Reaction Noted  . Latex Other (See Comments) 04/21/2012    Family History  Problem Relation Age of Onset  . Hypertension Maternal Grandmother   . Stroke Maternal Grandmother   . Hypertension Maternal Grandfather   . Stroke Maternal Grandfather   . Crohn's disease Maternal Aunt   . Crohn's disease Cousin   . Colon cancer Neg Hx     History   Social History  . Marital Status: Married    Spouse Name: N/A    Number of Children: N/A  . Years of Education: N/A   Occupational History  . [employer]    Social History Main Topics  . Smoking status: Current Every Day Smoker -- 0.25 packs/day    Types: Cigarettes  . Smokeless tobacco: Not on file     Comment: in process  of quitting  . Alcohol Use: Yes     Comment: occasionally  . Drug Use: No  . Sexually Active: Yes    Birth Control/ Protection: IUD   Other Topics Concern  . Not on file   Social History Narrative  . No narrative on file    Review of Systems: Negative unless mentioned in HPI.   Physical Exam: BP 124/74  Pulse 79  Temp(Src) 98.2 F (36.8 C) (Oral)  Ht 5\' 1"  (1.549 m)  Wt 146 lb 3.2 oz (66.316 kg)  BMI 27.64 kg/m2 General:   Alert and oriented. Well-developed, well-nourished, pleasant and cooperative. Head:  Normocephalic and atraumatic. Eyes:  Conjunctiva pink, sclera clear, no icterus.   Conjunctiva pink. Ears:  Normal auditory acuity. Nose:  No deformity, discharge,  or lesions. Mouth:  No deformity or lesions, mucosa pink and moist.   Neck:  Supple, without mass or thyromegaly. Lungs:  Clear to auscultation bilaterally, without wheezing, rales, or rhonchi.  Heart:  S1, S2 present without murmurs noted.  Abdomen:  +BS, soft, sore to palpation upper abdomen and non-distended. Without mass or HSM. No rebound or guarding. No hernias noted. Rectal:  Deferred  Msk:  Symmetrical without gross deformities. Normal posture. Extremities:  Without clubbing or edema. Neurologic:  Alert and  oriented x4;  grossly normal neurologically. Skin:  Intact, warm and dry without significant lesions or rashes Cervical Nodes:  No significant cervical adenopathy. Psych:  Alert and cooperative. Normal mood and affect.

## 2013-04-30 NOTE — H&P (View-Only) (Signed)
Cc PCP 

## 2013-04-30 NOTE — Interval H&P Note (Signed)
History and Physical Interval Note:  04/30/2013 2:23 PM  Cristina Davenport [name]  has presented today for surgery, with the diagnosis of NAUSEA  AND VOMITING  The various methods of treatment have been discussed with the patient and family. After consideration of risks, benefits and other options for treatment, the patient has consented to  Procedure(s) with comments: ESOPHAGOGASTRODUODENOSCOPY (EGD) (N/A) - 2:00 as a surgical intervention .  The patient's history has been reviewed, patient examined, no change in status, stable for surgery.  I have reviewed the patient's chart and labs.  Questions were answered to the patient's satisfaction.     Eula Listen

## 2013-04-30 NOTE — Interval H&P Note (Signed)
History and Physical Interval Note:  04/30/2013 2:29 PM  Cristina Davenport  has presented today for surgery, with the diagnosis of NAUSEA  AND VOMITING  The various methods of treatment have been discussed with the patient and family. After consideration of risks, benefits and other options for treatment, the patient has consented to  Procedure(s) with comments: ESOPHAGOGASTRODUODENOSCOPY (EGD) (N/A) - 2:00 as a surgical intervention .  The patient's history has been reviewed, patient examined, no change in status, stable for surgery.  I have reviewed the patient's chart and labs.  Questions were answered to the patient's satisfaction.     EGD per plan.The risks, benefits, limitations, alternatives and imponderables have been reviewed with the patient. Potential for esophageal dilation, biopsy, etc. have also been reviewed.  Questions have been answered. All parties agreeable.  Eula Listen

## 2013-04-30 NOTE — Op Note (Signed)
[facility] [address] [place], [address]   ENDOSCOPY PROCEDURE REPORT  PATIENT: Cristina, Davenport  MR#: [id number] BIRTHDATE: 06/28/1986 , 26  yrs. old GENDER: Female ENDOSCOPIST: R.  Roetta Sessions, MD FACP FACG REFERRED BY:  Lilyan Punt, M.D. PROCEDURE DATE:  04/30/2013 PROCEDURE:     EGD with esophageal biopsy  INDICATIONS:     postprandial epigastric and right upper quadrant abdominal pain; frequent NSAID use recently  INFORMED CONSENT:   The risks, benefits, limitations, alternatives and imponderables have been discussed.  The potential for biopsy, esophogeal dilation, etc. have also been reviewed.  Questions have been answered.  All parties agreeable.  Please see the history and physical in the medical record for more information.  MEDICATIONS:   Versed 6 mg IV and Demerol 100 mg IV in divided doses. Phenergan 25 mg IV. Zofran 4 mg  DESCRIPTION OF PROCEDURE:   The EG-[id number] ([id number])  endoscope was introduced through the mouth and advanced to the second portion of the duodenum without difficulty or limitations.  The mucosal surfaces were surveyed very carefully during advancement of the scope and upon withdrawal.  Retroflexion view of the proximal stomach and esophagogastric junction was performed.      FINDINGS:  Accentuated undulating Z line with some salmon-color epithelium extending up into the distal esophagus within 2 cm of the GE junction. No esophagitis. Stomach empty. Abnormal gastric mucosa. Tiny hiatal hernia. Patent pylorus. Normal first and second portion of the duodenum  THERAPEUTIC / DIAGNOSTIC MANEUVERS PERFORMED:  Biopsies of the distal esophagus taken for histologic study   COMPLICATIONS:  None  IMPRESSION:  Abnormal distal esophagus of uncertain clinical significance-status post biopsy. Tiny hiatal hernia. No explanation for her symptoms based on today's examination. Biliary origin more likely.  RECOMMENDATIONS:  Followup  on pathology. Proceed with a gallbladder ultrasound.    _______________________________ R. Roetta Sessions, MD FACP [facility] eSigned:  R. Roetta Sessions, MD FACP [facility] 04/30/2013 2:59 PM     CC:

## 2013-05-01 ENCOUNTER — Other Ambulatory Visit: Payer: Self-pay | Admitting: Internal Medicine

## 2013-05-01 DIAGNOSIS — G8929 Other chronic pain: Secondary | ICD-10-CM

## 2013-05-01 DIAGNOSIS — R112 Nausea with vomiting, unspecified: Secondary | ICD-10-CM

## 2013-05-02 ENCOUNTER — Other Ambulatory Visit: Payer: Self-pay | Admitting: Internal Medicine

## 2013-05-02 ENCOUNTER — Ambulatory Visit (HOSPITAL_COMMUNITY)
Admission: RE | Admit: 2013-05-02 | Discharge: 2013-05-02 | Disposition: A | Discharge status: Home / Self Care | Payer: BC Managed Care – PPO | Source: Ambulatory Visit | Attending: Internal Medicine | Admitting: Internal Medicine

## 2013-05-02 DIAGNOSIS — G8929 Other chronic pain: Secondary | ICD-10-CM

## 2013-05-02 DIAGNOSIS — R1011 Right upper quadrant pain: Secondary | ICD-10-CM

## 2013-05-02 DIAGNOSIS — R112 Nausea with vomiting, unspecified: Secondary | ICD-10-CM | POA: Insufficient documentation

## 2013-05-02 IMAGING — US US ABDOMEN LIMITED
1 series · 14 of 25 positions shown · non-contrast
Comparison: None

CLINICAL DATA: Chronic right upper quadrant pain, nausea, vomiting

LIMITED ABDOMINAL ULTRASOUND - RIGHT UPPER QUADRANT

[Series 1: us abdomen limited · 0.20mm/px · 14 of 62 slices shown]
[im 1/62]
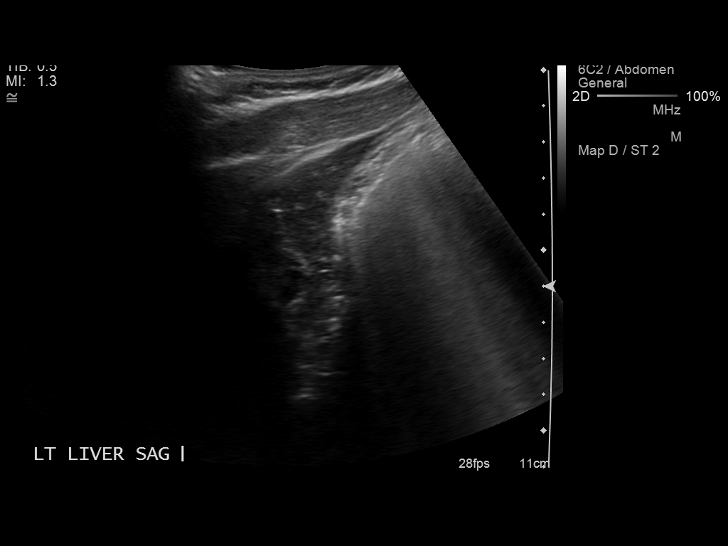
[im 6/62]
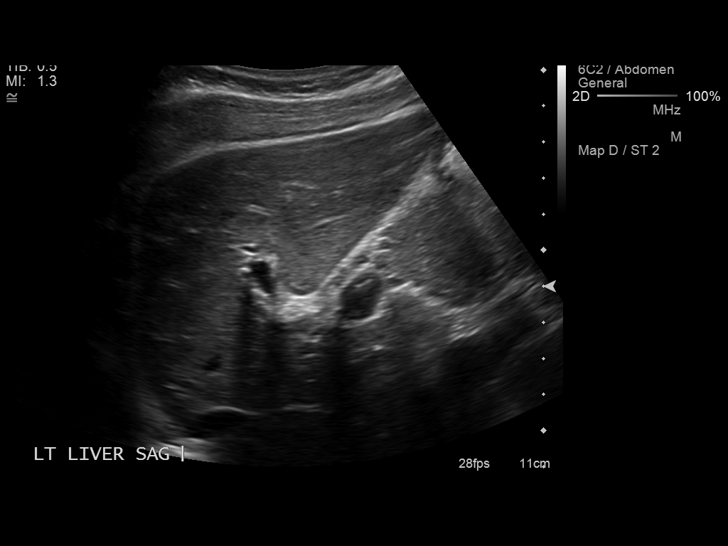
[im 11/62]
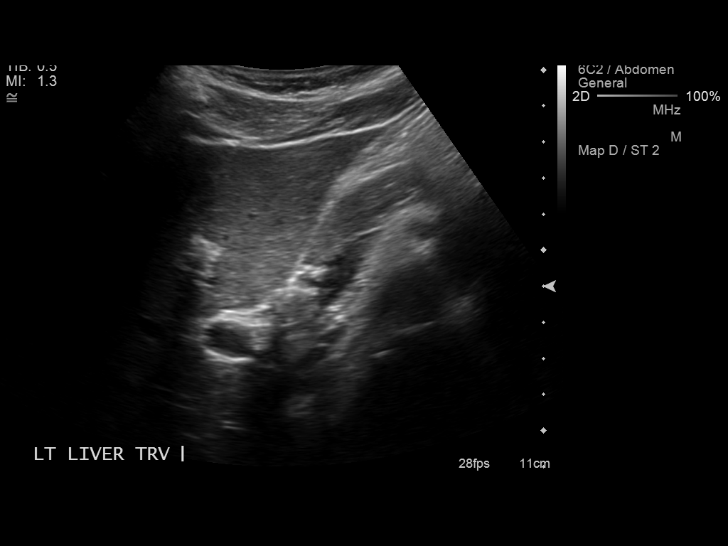
[im 16/62]
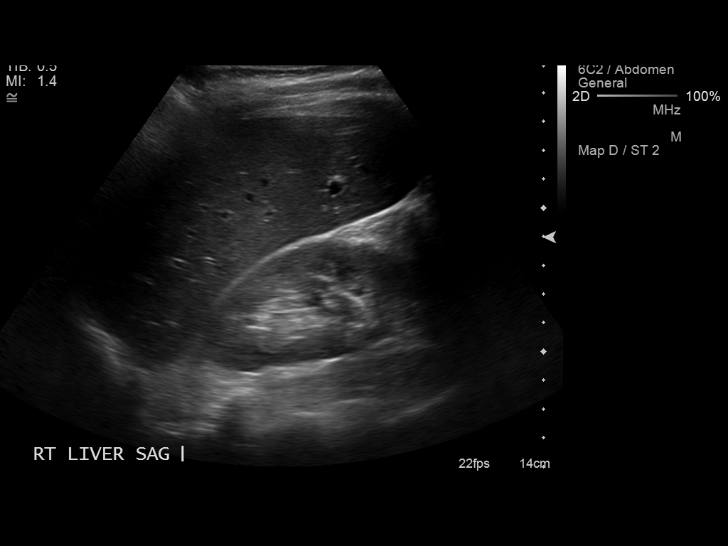
[im 21/62]
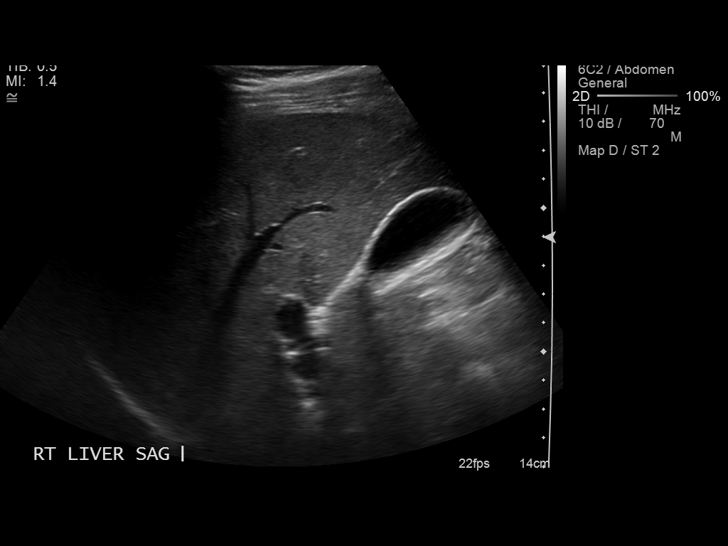
[im 23/62]
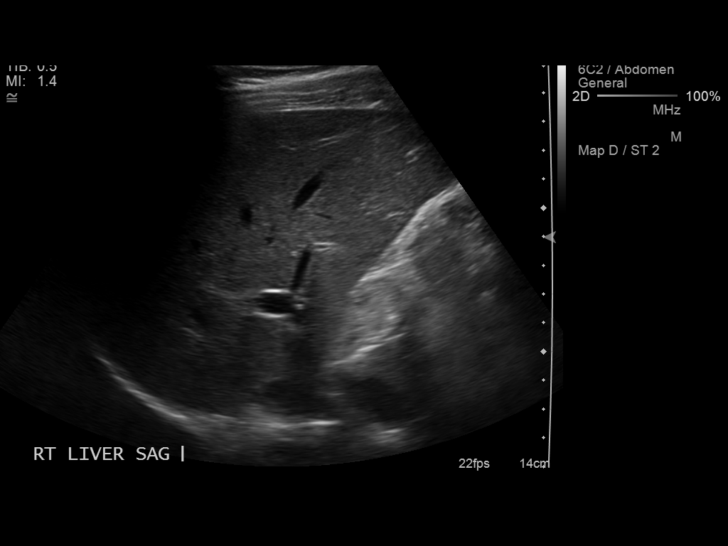
[im 28/62]
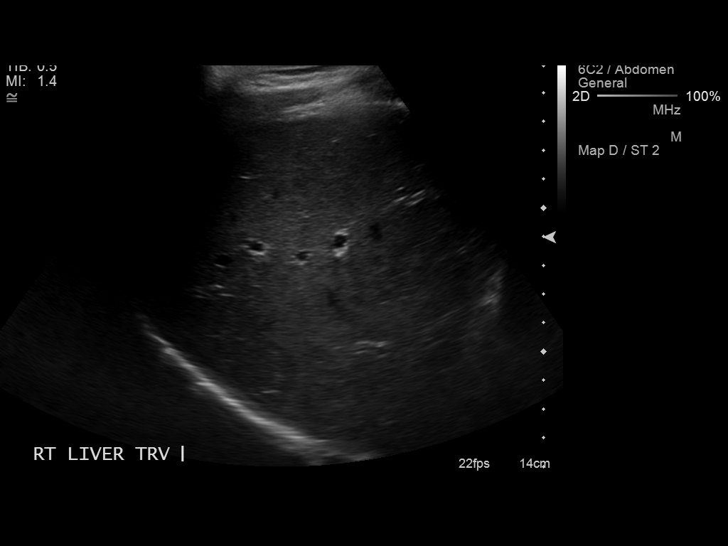
[im 34/62]
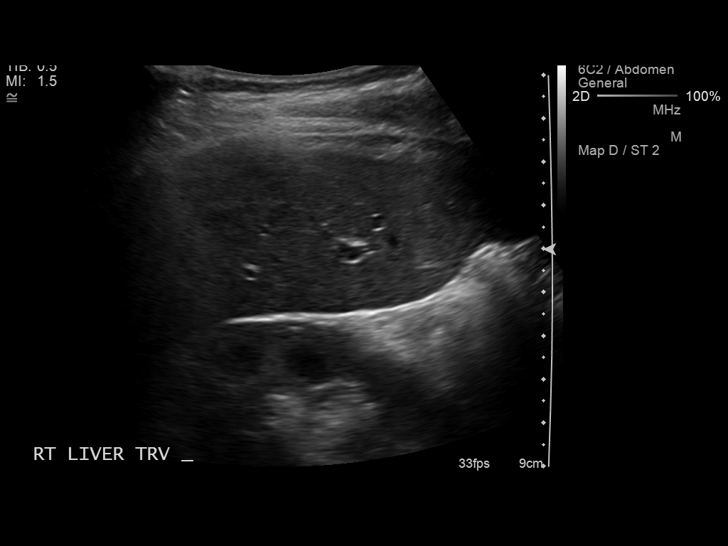
[im 39/62]
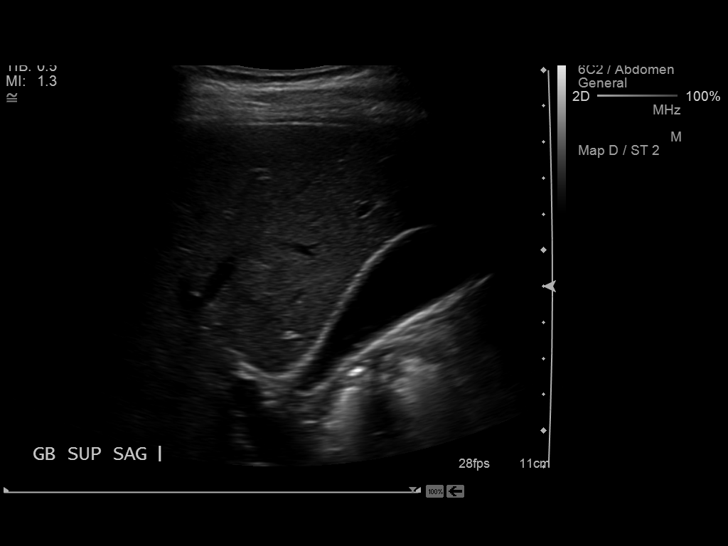
[im 41/62]
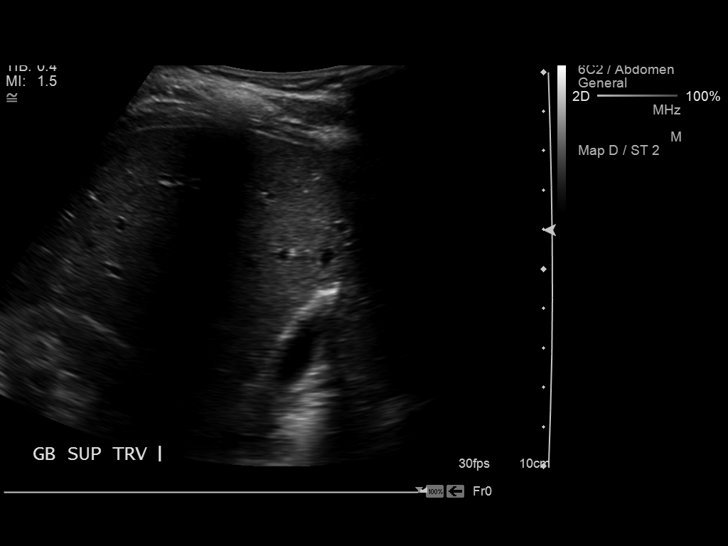
[im 46/62]
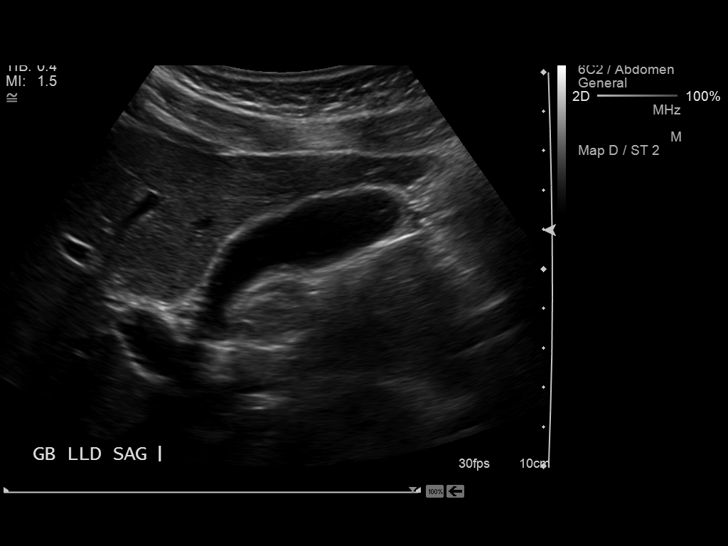
[im 51/62]
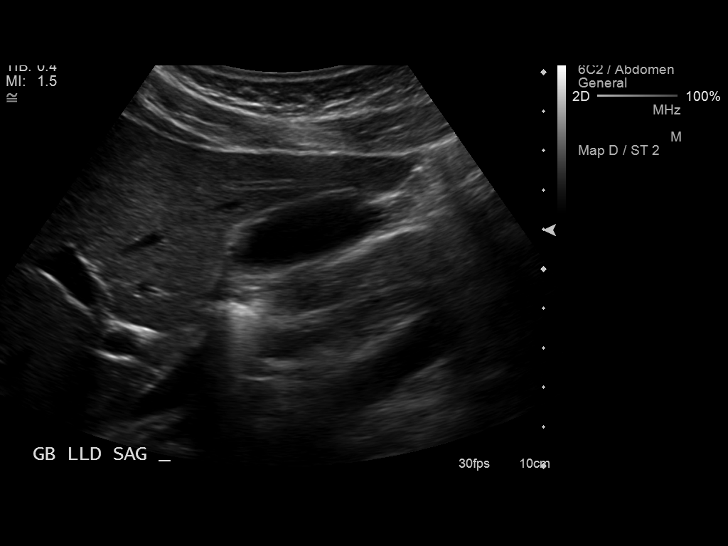
[im 56/62]
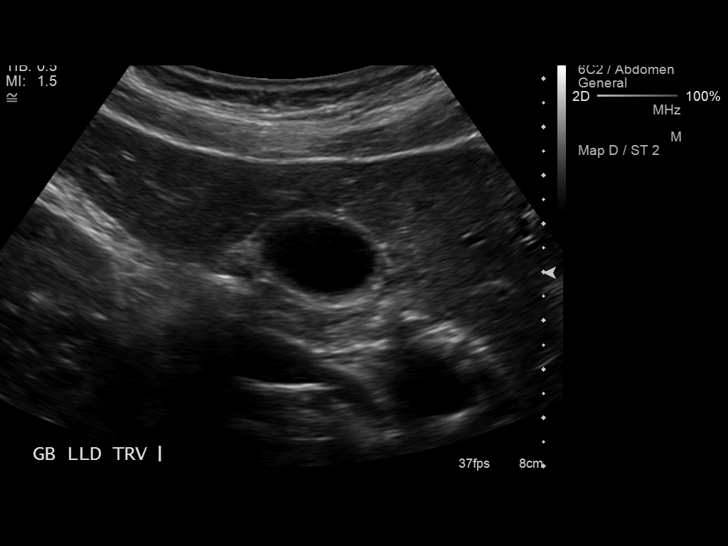
[im 62/62]
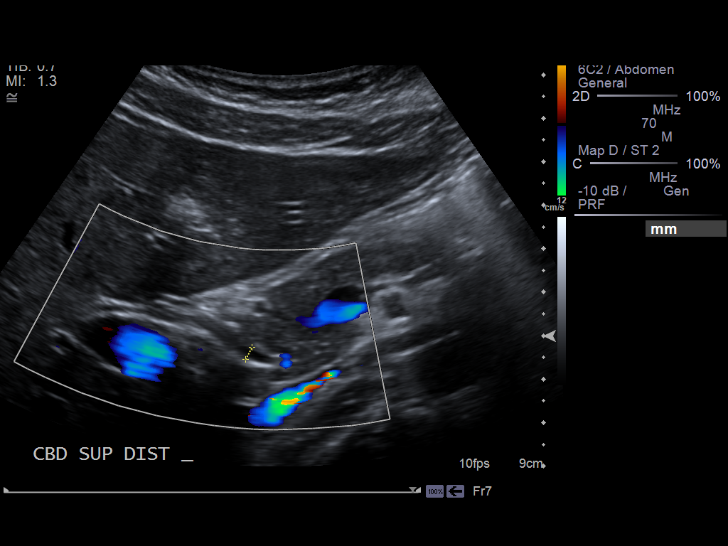

[14 of 25 positions shown; findings below may reference images not displayed]

FINDINGS: Gallbladder:  Normally distended without stones or wall thickening.
No pericholecystic fluid or sonographic Murphy sign.

Common bile duct:  4 mm diameter, normal

Liver:  Normal appearance

No right upper quadrant ascites identified.
IMPRESSION: Normal exam.

## 2013-05-02 NOTE — Progress Notes (Signed)
Patient is scheduled on Tues June 24 at 10:30 patient to arrive at 10:15 NPO after midnight and no pain meds after midnight

## 2013-05-03 ENCOUNTER — Encounter (HOSPITAL_COMMUNITY): Payer: Self-pay | Admitting: Internal Medicine

## 2013-05-03 ENCOUNTER — Other Ambulatory Visit (HOSPITAL_COMMUNITY): Payer: BC Managed Care – PPO

## 2013-05-06 ENCOUNTER — Encounter: Payer: Self-pay | Admitting: Internal Medicine

## 2013-05-07 ENCOUNTER — Encounter (HOSPITAL_COMMUNITY): Payer: BC Managed Care – PPO

## 2013-05-08 ENCOUNTER — Encounter (HOSPITAL_COMMUNITY): Payer: Self-pay

## 2013-05-08 ENCOUNTER — Encounter (HOSPITAL_COMMUNITY)
Admission: RE | Admit: 2013-05-08 | Discharge: 2013-05-08 | Disposition: A | Discharge status: Home / Self Care | Payer: BC Managed Care – PPO | Source: Ambulatory Visit | Attending: Internal Medicine | Admitting: Internal Medicine

## 2013-05-08 DIAGNOSIS — R1011 Right upper quadrant pain: Secondary | ICD-10-CM | POA: Insufficient documentation

## 2013-05-08 IMAGING — NM NM HEPATO W/GB/PHARM/[PERSON_NAME]
2 series · 12 of 12 positions shown · non-contrast
Comparison: None

CLINICAL DATA: Right upper quadrant abdominal pain

NUCLEAR MEDICINE HEPATOBILIARY IMAGING WITH GALLBLADDER EF:
TECHNIQUE: Sequential images of the abdomen were obtained for 60
minutes following intravenous administration of
radiopharmaceutical.  Patient then ingested 8 ounces of
commercially available Ensure Plus and imaging was continued for 60
minutes.  A time-activity curve was generated from tracer within
the gallbladder following Ensure Plus ingestion, and the
gallbladder ejection fraction was calculated.
Radiopharmaceutical:  5 mCi [WQ] mebrofenin

[hida · 3.20mm/px · 6 of 60 frames shown (1 of 2)]
[frame 6/60]
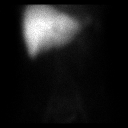
[frame 16/60]
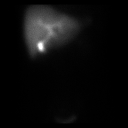
[frame 26/60]
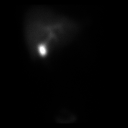
[frame 36/60]
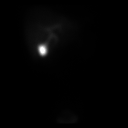
[frame 46/60]
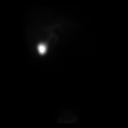
[frame 56/60]
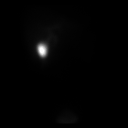

[hida · 3.20mm/px · 6 of 60 frames shown (2 of 2)]
[frame 6/60]
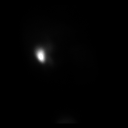
[frame 16/60]
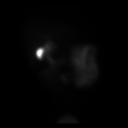
[frame 26/60]
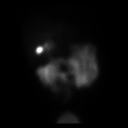
[frame 36/60]
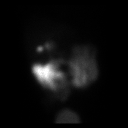
[frame 46/60]
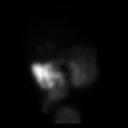
[frame 56/60]
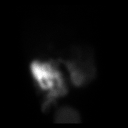

[12 of 12 positions shown; findings below may reference images not displayed]

FINDINGS: Prompt tracer extraction from bloodstream, indicating normal
hepatocellular function.
Prompt excretion of tracer into biliary tree.
Gallbladder visualized at 12 minutes.
Small bowel tracer not visualized until following fatty meal
stimulation.
No hepatic retention of tracer.

Subjectively normal emptying of tracer from gallbladder following
fatty meal stimulation.
Calculated gallbladder ejection fraction is 97%, normal.
Patient experienced slight abdominal pain following fatty meal
ingestion.
IMPRESSION: Patent biliary tree.
Normal gallbladder response to fatty meal stimulation with normal
gallbladder ejection fraction of 97%.
Slight abdominal pain following fatty meal ingestion.

Normal values for gallbladder ejection fraction:
> 30% for exams utilizing sincalide (CCK)
> 33% for exams utilizing fatty meal stimulation with Ensure Plus

## 2013-05-08 MED ORDER — TECHNETIUM TC 99M MEBROFENIN IV KIT
5.0000 | PACK | Freq: Once | INTRAVENOUS | Status: AC | PRN
  Administered 2013-05-08: 5 via INTRAVENOUS

## 2013-05-10 ENCOUNTER — Telehealth: Payer: Self-pay

## 2013-05-10 NOTE — Telephone Encounter (Signed)
Called pt with normal HIDA results- she continues to have severe episodes of abd pain. She said it doesn't happen all the time. The pain is not always related to eating. She is aware that RMR wants her to come back to the office for follow up ov. She said she cannot get off of work anytime soon and wants to know if there is anything else we can suggest. Pt is aware that [facility] is out of the office on Fridays and that I will be calling her back next week.

## 2013-05-13 NOTE — Telephone Encounter (Signed)
It appears she had some mild pain with the HIDA; let's refer to Gen Surg for consideration of elective cholecystectomy. Is she having any issues with lower GI symptoms? Any rectal bleeding? I think she will eventually need a colonoscopy if she notes any rectal bleeding.

## 2013-05-13 NOTE — Telephone Encounter (Signed)
Pt aware, she is having problems with cramping and constipation. She hasnt noticed any blood in her stool, but she said she really hasnt looked real closed either. Do you want her to do an IFOBT?   She said it was ok to refer her to Dr. Lovell Sheehan. Benedetto Goad, please refer.

## 2013-05-14 ENCOUNTER — Other Ambulatory Visit: Payer: Self-pay | Admitting: Gastroenterology

## 2013-05-14 DIAGNOSIS — K915 Postcholecystectomy syndrome: Secondary | ICD-10-CM

## 2013-05-14 MED ORDER — LINACLOTIDE 290 MCG PO CAPS: 1.0000 | ORAL_CAPSULE | Freq: Every day | ORAL | Status: DC

## 2013-05-14 NOTE — Telephone Encounter (Signed)
Pt aware. ifobt in the mail, pt is aware to mail it back to [place] when she completes the test. She will call if she has further problems.  Darl Pikes, please nic appt in 6-8 weeks.

## 2013-05-14 NOTE — Telephone Encounter (Signed)
Tried to call pt- LMOM 

## 2013-05-14 NOTE — Telephone Encounter (Signed)
Referral has been faxed to Dr. Cherylann Ratel

## 2013-05-14 NOTE — Telephone Encounter (Signed)
I sent in Linzess 290 mcg daily.  Good idea to get an ifobt. She had mentioned possible rectal bleeding, but it was unclear at her visit.   Recommend OV in about 6-8 weeks after starting Linzess.

## 2013-05-21 NOTE — Telephone Encounter (Signed)
Pt is aware of OV on 8/20 with AS and appt card was mailed

## 2013-05-23 ENCOUNTER — Other Ambulatory Visit: Payer: Self-pay | Admitting: Family Medicine

## 2013-06-06 ENCOUNTER — Telehealth: Payer: Self-pay | Admitting: Family Medicine

## 2013-06-06 ENCOUNTER — Other Ambulatory Visit: Payer: Self-pay | Admitting: *Deleted

## 2013-06-06 MED ORDER — ONDANSETRON 8 MG PO TBDP: 8.0000 mg | ORAL_TABLET | Freq: Three times a day (TID) | ORAL | Status: DC | PRN

## 2013-06-06 NOTE — Telephone Encounter (Signed)
Med sent in; pt notified 

## 2013-06-06 NOTE — Telephone Encounter (Signed)
Patient needs ondansetron (ZOFRAN-ODT) 8 MG disintegrating tablet refilled please.  [facility] in [place]  Please call patient  Thanks

## 2013-06-06 NOTE — Telephone Encounter (Signed)
Refill times three. 

## 2013-06-20 ENCOUNTER — Encounter (HOSPITAL_COMMUNITY): Payer: Self-pay | Admitting: Pharmacy Technician

## 2013-06-26 ENCOUNTER — Other Ambulatory Visit: Payer: Self-pay

## 2013-06-26 MED ORDER — SUCRALFATE 1 G PO TABS: ORAL_TABLET | ORAL | Status: DC

## 2013-07-01 ENCOUNTER — Other Ambulatory Visit: Payer: Self-pay | Admitting: *Deleted

## 2013-07-01 MED ORDER — SUCRALFATE 1 G PO TABS: ORAL_TABLET | ORAL | Status: DC

## 2013-07-02 ENCOUNTER — Encounter (HOSPITAL_COMMUNITY): Payer: Self-pay

## 2013-07-02 ENCOUNTER — Encounter (HOSPITAL_COMMUNITY)
Admission: RE | Admit: 2013-07-02 | Discharge: 2013-07-02 | Disposition: A | Discharge status: Home / Self Care | Payer: BC Managed Care – PPO | Source: Ambulatory Visit | Attending: General Surgery | Admitting: General Surgery

## 2013-07-02 DIAGNOSIS — Z01812 Encounter for preprocedural laboratory examination: Secondary | ICD-10-CM | POA: Insufficient documentation

## 2013-07-02 LAB — BASIC METABOLIC PANEL
BUN: 10 mg/dL (ref 6–23)
CO2: 28 mEq/L (ref 19–32)
Calcium: 9.3 mg/dL (ref 8.4–10.5)
Chloride: 101 mEq/L (ref 96–112)
Creatinine, Ser: 0.65 mg/dL (ref 0.50–1.10)
GFR calc Af Amer: >90 mL/min (ref >90)
GFR calc non Af Amer: >90 mL/min (ref >90)
Glucose, Bld: 106 mg/dL — ABNORMAL HIGH (ref 70–99)
Potassium: 4.2 mEq/L (ref 3.5–5.1)
Sodium: 138 mEq/L (ref 135–145)

## 2013-07-02 LAB — HCG, QUANTITATIVE, PREGNANCY: hCG, Beta Chain, Quant, S: <1 m[IU]/mL (ref <5)

## 2013-07-02 LAB — CBC WITH DIFFERENTIAL/PLATELET
Basophils Absolute: 0 10*3/uL (ref 0.0–0.1)
Basophils Relative: 0 % (ref 0–1)
Eosinophils Absolute: 0.2 10*3/uL (ref 0.0–0.7)
Eosinophils Relative: 2 % (ref 0–5)
HCT: 39.3 % (ref 36.0–46.0)
Hemoglobin: 13.1 g/dL (ref 12.0–15.0)
Lymphocytes Relative: 29 % (ref 12–46)
Lymphs Abs: 2.2 10*3/uL (ref 0.7–4.0)
MCH: 30 pg (ref 26.0–34.0)
MCHC: 33.3 g/dL (ref 30.0–36.0)
MCV: 90.1 fL (ref 78.0–100.0)
Monocytes Absolute: 0.8 10*3/uL (ref 0.1–1.0)
Monocytes Relative: 11 % (ref 3–12)
Neutro Abs: 4.3 10*3/uL (ref 1.7–7.7)
Neutrophils Relative %: 57 % (ref 43–77)
Platelets: 233 10*3/uL (ref 150–400)
RBC: 4.36 MIL/uL (ref 3.87–5.11)
RDW: 12.5 % (ref 11.5–15.5)
WBC: 7.6 10*3/uL (ref 4.0–10.5)

## 2013-07-02 NOTE — Patient Instructions (Addendum)
Cristina Davenport  07/02/2013   Your procedure is scheduled on:  07/10/2013  Report to [facility] at  700  AM.  Call this number if you have problems the morning of surgery: [phone]   Remember:   Do not eat food or drink liquids after midnight.   Take these medicines the morning of surgery with A SIP OF WATER:  Klonopin, lexapro, lamictal   Do not wear jewelry, make-up or nail polish.  Do not wear lotions, powders, or perfumes. Do not shave 48 hours prior to surgery. Men may shave face and neck.  Do not bring valuables to the hospital.  [facility] is not responsible  for any belongings or valuables.  Contacts, dentures or bridgework may not be worn into surgery.  Leave suitcase in the car. After surgery it may be brought to your room.  For patients admitted to the hospital, checkout time is 11:00 AM the day of discharge.   Patients discharged the day of surgery will not be allowed to drive home.  Name and phone number of your driver: family  Special Instructions: Shower using CHG 2 nights before surgery and the night before surgery.  If you shower the day of surgery use CHG.  Use special wash - you have one bottle of CHG for all showers.  You should use approximately 1/3 of the bottle for each shower.   Please read over the following fact sheets that you were given: Pain Booklet, Coughing and Deep Breathing, Surgical Site Infection Prevention, Anesthesia Post-op Instructions and Care and Recovery After Surgery Laparoscopic Cholecystectomy Laparoscopic cholecystectomy is surgery to remove the gallbladder. The gallbladder is located slightly to the right of center in the abdomen, behind the liver. It is a concentrating and storage sac for the bile produced in the liver. Bile aids in the digestion and absorption of fats. Gallbladder disease (cholecystitis) is an inflammation of your gallbladder. This condition is usually caused by a buildup of gallstones (cholelithiasis) in your  gallbladder. Gallstones can block the flow of bile, resulting in inflammation and pain. In severe cases, emergency surgery may be required. When emergency surgery is not required, you will have time to prepare for the procedure. Laparoscopic surgery is an alternative to open surgery. Laparoscopic surgery usually has a shorter recovery time. Your common bile duct may also need to be examined and explored. Your caregiver will discuss this with you if he or she feels this should be done. If stones are found in the common bile duct, they may be removed. LET YOUR CAREGIVER KNOW ABOUT:  Allergies to food or medicine.  Medicines taken, including vitamins, herbs, eyedrops, over-the-counter medicines, and creams.  Use of steroids (by mouth or creams).  Previous problems with anesthetics or numbing medicines.  History of bleeding problems or blood clots.  Previous surgery.  Other health problems, including diabetes and kidney problems.  Possibility of pregnancy, if this applies. RISKS AND COMPLICATIONS All surgery is associated with risks. Some problems that may occur following this procedure include:  Infection.  Damage to the common bile duct, nerves, arteries, veins, or other internal organs such as the stomach or intestines.  Bleeding.  A stone may remain in the common bile duct. BEFORE THE PROCEDURE  Do not take aspirin for 3 days prior to surgery or blood thinners for 1 week prior to surgery.  Do not eat or drink anything after midnight the night before surgery.  Let your caregiver know if you  develop a cold or other infectious problem prior to surgery.  You should be present 60 minutes before the procedure or as directed. PROCEDURE  You will be given medicine that makes you sleep (general anesthetic). When you are asleep, your surgeon will make several small cuts (incisions) in your abdomen. One of these incisions is used to insert a small, lighted scope (laparoscope) into the  abdomen. The laparoscope helps the surgeon see into your abdomen. Carbon dioxide gas will be pumped into your abdomen. The gas allows more room for the surgeon to perform your surgery. Other operating instruments are inserted through the other incisions. Laparoscopic procedures may not be appropriate when:  There is major scarring from previous surgery.  The gallbladder is extremely inflamed.  There are bleeding disorders or unexpected cirrhosis of the liver.  A pregnancy is near term.  Other conditions make the laparoscopic procedure impossible. If your surgeon feels it is not safe to continue with a laparoscopic procedure, he or she will perform an open abdominal procedure. In this case, the surgeon will make an incision to open the abdomen. This gives the surgeon a larger view and field to work within. This may allow the surgeon to perform procedures that sometimes cannot be performed with a laparoscope alone. Open surgery has a longer recovery time. AFTER THE PROCEDURE  You will be taken to the recovery area where a nurse will watch and check your progress.  You may be allowed to go home the same day.  Do not resume physical activities until directed by your caregiver.  You may resume a normal diet and activities as directed. Document Released: 10/31/2005 Document Revised: 01/23/2012 Document Reviewed: 04/15/2011 [facility] Patient Information 2014 [place], [place]. PATIENT INSTRUCTIONS POST-ANESTHESIA  IMMEDIATELY FOLLOWING SURGERY:  Do not drive or operate machinery for the first twenty four hours after surgery.  Do not make any important decisions for twenty four hours after surgery or while taking narcotic pain medications or sedatives.  If you develop intractable nausea and vomiting or a severe headache please notify your doctor immediately.  FOLLOW-UP:  Please make an appointment with your surgeon as instructed. You do not need to follow up with anesthesia unless specifically  instructed to do so.  WOUND CARE INSTRUCTIONS (if applicable):  Keep a dry clean dressing on the anesthesia/puncture wound site if there is drainage.  Once the wound has quit draining you may leave it open to air.  Generally you should leave the bandage intact for twenty four hours unless there is drainage.  If the epidural site drains for more than 36-48 hours please call the anesthesia department.  QUESTIONS?:  Please feel free to call your physician or the hospital operator if you have any questions, and they will be happy to assist you.

## 2013-07-03 ENCOUNTER — Ambulatory Visit: Payer: BC Managed Care – PPO | Admitting: Gastroenterology

## 2013-07-03 ENCOUNTER — Telehealth: Payer: Self-pay | Admitting: *Deleted

## 2013-07-03 NOTE — Telephone Encounter (Signed)
Please send letter for follow-up.  

## 2013-07-03 NOTE — Telephone Encounter (Signed)
Pt is a no show

## 2013-07-04 ENCOUNTER — Other Ambulatory Visit: Payer: Self-pay | Admitting: *Deleted

## 2013-07-04 ENCOUNTER — Encounter: Payer: Self-pay | Admitting: *Deleted

## 2013-07-04 ENCOUNTER — Other Ambulatory Visit (HOSPITAL_COMMUNITY): Payer: BC Managed Care – PPO

## 2013-07-04 MED ORDER — LAMOTRIGINE 100 MG PO TABS: ORAL_TABLET | ORAL | Status: DC

## 2013-07-04 NOTE — Telephone Encounter (Signed)
Letter sent to pt

## 2013-07-10 ENCOUNTER — Encounter (HOSPITAL_COMMUNITY): Payer: Self-pay | Admitting: *Deleted

## 2013-07-10 ENCOUNTER — Encounter (HOSPITAL_COMMUNITY)
Admission: RE | Disposition: A | Discharge status: Home / Self Care | Payer: Self-pay | Source: Ambulatory Visit | Attending: General Surgery

## 2013-07-10 ENCOUNTER — Ambulatory Visit (HOSPITAL_COMMUNITY)
Admission: RE | Admit: 2013-07-10 | Discharge: 2013-07-10 | Disposition: A | Discharge status: Home / Self Care | Payer: BC Managed Care – PPO | Source: Ambulatory Visit | Attending: General Surgery | Admitting: General Surgery

## 2013-07-10 ENCOUNTER — Encounter (HOSPITAL_COMMUNITY): Payer: Self-pay | Admitting: Anesthesiology

## 2013-07-10 ENCOUNTER — Ambulatory Visit (HOSPITAL_COMMUNITY): Payer: BC Managed Care – PPO | Admitting: Anesthesiology

## 2013-07-10 DIAGNOSIS — K828 Other specified diseases of gallbladder: Secondary | ICD-10-CM

## 2013-07-10 HISTORY — PX: CHOLECYSTECTOMY: SHX55

## 2013-07-10 SURGERY — LAPAROSCOPIC CHOLECYSTECTOMY
Anesthesia: General | Site: Abdomen | Wound class: Clean Contaminated

## 2013-07-10 MED ORDER — LIDOCAINE HCL (PF) 1 % IJ SOLN
INTRAMUSCULAR | Status: AC
  Filled 2013-07-10: qty 5

## 2013-07-10 MED ORDER — DIPHENHYDRAMINE HCL 50 MG/ML IJ SOLN
INTRAMUSCULAR | Status: AC
  Filled 2013-07-10: qty 1

## 2013-07-10 MED ORDER — SUCCINYLCHOLINE CHLORIDE 20 MG/ML IJ SOLN
INTRAMUSCULAR | Status: AC
  Filled 2013-07-10: qty 1

## 2013-07-10 MED ORDER — ROCURONIUM BROMIDE 50 MG/5ML IV SOLN
INTRAVENOUS | Status: AC
  Filled 2013-07-10: qty 1

## 2013-07-10 MED ORDER — BUPIVACAINE HCL (PF) 0.5 % IJ SOLN
INTRAMUSCULAR | Status: DC | PRN
  Administered 2013-07-10: 10 mL

## 2013-07-10 MED ORDER — NEOSTIGMINE METHYLSULFATE 1 MG/ML IJ SOLN
INTRAMUSCULAR | Status: DC | PRN
  Administered 2013-07-10: 4 mg via INTRAVENOUS

## 2013-07-10 MED ORDER — CEFAZOLIN SODIUM-DEXTROSE 2-3 GM-% IV SOLR
2.0000 g | INTRAVENOUS | Status: AC
  Administered 2013-07-10: 2 g via INTRAVENOUS

## 2013-07-10 MED ORDER — SUCCINYLCHOLINE CHLORIDE 20 MG/ML IJ SOLN
INTRAMUSCULAR | Status: DC | PRN
  Administered 2013-07-10: 100 mg via INTRAVENOUS

## 2013-07-10 MED ORDER — HEMOSTATIC AGENTS (NO CHARGE) OPTIME
TOPICAL | Status: DC | PRN
  Administered 2013-07-10: 1 via TOPICAL

## 2013-07-10 MED ORDER — FENTANYL CITRATE 0.05 MG/ML IJ SOLN
INTRAMUSCULAR | Status: AC
  Filled 2013-07-10: qty 2

## 2013-07-10 MED ORDER — ENOXAPARIN SODIUM 40 MG/0.4ML ~~LOC~~ SOLN
SUBCUTANEOUS | Status: AC
  Filled 2013-07-10: qty 0.4

## 2013-07-10 MED ORDER — ARTIFICIAL TEARS OP OINT: TOPICAL_OINTMENT | OPHTHALMIC | Status: DC | PRN

## 2013-07-10 MED ORDER — SODIUM CHLORIDE 0.9 % IR SOLN
Status: DC | PRN
  Administered 2013-07-10: 1000 mL

## 2013-07-10 MED ORDER — HYDROCODONE-ACETAMINOPHEN 5-325 MG PO TABS: 1.0000 | ORAL_TABLET | ORAL | Status: DC | PRN

## 2013-07-10 MED ORDER — MIDAZOLAM HCL 2 MG/2ML IJ SOLN
1.0000 mg | Freq: Once | INTRAMUSCULAR | Status: AC
  Administered 2013-07-10: 1 mg via INTRAVENOUS

## 2013-07-10 MED ORDER — ONDANSETRON HCL 4 MG/2ML IJ SOLN: 4.0000 mg | Freq: Once | INTRAMUSCULAR | Status: DC | PRN

## 2013-07-10 MED ORDER — FENTANYL CITRATE 0.05 MG/ML IJ SOLN
INTRAMUSCULAR | Status: AC
  Filled 2013-07-10: qty 5

## 2013-07-10 MED ORDER — DEXAMETHASONE SODIUM PHOSPHATE 4 MG/ML IJ SOLN
INTRAMUSCULAR | Status: AC
  Filled 2013-07-10: qty 1

## 2013-07-10 MED ORDER — PROPOFOL 10 MG/ML IV BOLUS
INTRAVENOUS | Status: DC | PRN
  Administered 2013-07-10: 130 mg via INTRAVENOUS

## 2013-07-10 MED ORDER — MIDAZOLAM HCL 2 MG/2ML IJ SOLN
INTRAMUSCULAR | Status: AC
  Filled 2013-07-10: qty 2

## 2013-07-10 MED ORDER — GLYCOPYRROLATE 0.2 MG/ML IJ SOLN
INTRAMUSCULAR | Status: AC
  Filled 2013-07-10: qty 3

## 2013-07-10 MED ORDER — NEOSTIGMINE METHYLSULFATE 1 MG/ML IJ SOLN
INTRAMUSCULAR | Status: AC
  Filled 2013-07-10: qty 1

## 2013-07-10 MED ORDER — CEFAZOLIN SODIUM-DEXTROSE 2-3 GM-% IV SOLR
INTRAVENOUS | Status: AC
  Filled 2013-07-10: qty 50

## 2013-07-10 MED ORDER — LACTATED RINGERS IV SOLN: INTRAVENOUS | Status: DC

## 2013-07-10 MED ORDER — CHLORHEXIDINE GLUCONATE 4 % EX LIQD: 1.0000 "application " | Freq: Once | CUTANEOUS | Status: DC

## 2013-07-10 MED ORDER — DIPHENHYDRAMINE HCL 50 MG/ML IJ SOLN
6.2500 mg | Freq: Once | INTRAMUSCULAR | Status: AC
  Administered 2013-07-10: 25 mg via INTRAVENOUS

## 2013-07-10 MED ORDER — CELECOXIB 100 MG PO CAPS: 400.0000 mg | ORAL_CAPSULE | Freq: Every day | ORAL | Status: DC

## 2013-07-10 MED ORDER — GLYCOPYRROLATE 0.2 MG/ML IJ SOLN
INTRAMUSCULAR | Status: DC | PRN
  Administered 2013-07-10: 0.6 mg via INTRAVENOUS

## 2013-07-10 MED ORDER — ONDANSETRON HCL 4 MG/2ML IJ SOLN
INTRAMUSCULAR | Status: AC
  Filled 2013-07-10: qty 2

## 2013-07-10 MED ORDER — BUPIVACAINE HCL (PF) 0.5 % IJ SOLN
INTRAMUSCULAR | Status: AC
  Filled 2013-07-10: qty 30

## 2013-07-10 MED ORDER — ENOXAPARIN SODIUM 40 MG/0.4ML ~~LOC~~ SOLN
40.0000 mg | Freq: Once | SUBCUTANEOUS | Status: AC
  Administered 2013-07-10: 40 mg via SUBCUTANEOUS

## 2013-07-10 MED ORDER — DEXAMETHASONE SODIUM PHOSPHATE 4 MG/ML IJ SOLN
4.0000 mg | Freq: Once | INTRAMUSCULAR | Status: AC
  Administered 2013-07-10: 4 mg via INTRAVENOUS

## 2013-07-10 MED ORDER — LIDOCAINE HCL (CARDIAC) 20 MG/ML IV SOLN
INTRAVENOUS | Status: DC | PRN
  Administered 2013-07-10: 50 mg via INTRAVENOUS

## 2013-07-10 MED ORDER — MIDAZOLAM HCL 5 MG/5ML IJ SOLN
INTRAMUSCULAR | Status: DC | PRN
  Administered 2013-07-10: 2 mg via INTRAVENOUS

## 2013-07-10 MED ORDER — FENTANYL CITRATE 0.05 MG/ML IJ SOLN
INTRAMUSCULAR | Status: DC | PRN
  Administered 2013-07-10: 150 ug via INTRAVENOUS
  Administered 2013-07-10: 50 ug via INTRAVENOUS
  Administered 2013-07-10: 100 ug via INTRAVENOUS
  Administered 2013-07-10: 50 ug via INTRAVENOUS
  Administered 2013-07-10: 100 ug via INTRAVENOUS

## 2013-07-10 MED ORDER — FENTANYL CITRATE 0.05 MG/ML IJ SOLN
25.0000 ug | INTRAMUSCULAR | Status: DC | PRN
  Administered 2013-07-10 (×2): 50 ug via INTRAVENOUS

## 2013-07-10 MED ORDER — PROPOFOL 10 MG/ML IV EMUL
INTRAVENOUS | Status: AC
  Filled 2013-07-10: qty 20

## 2013-07-10 MED ORDER — LACTATED RINGERS IV SOLN
INTRAVENOUS | Status: DC | PRN
  Administered 2013-07-10 (×2): via INTRAVENOUS
  Administered 2013-07-10: 1000 mL

## 2013-07-10 MED ORDER — MIDAZOLAM HCL 2 MG/2ML IJ SOLN
1.0000 mg | INTRAMUSCULAR | Status: DC | PRN
  Administered 2013-07-10: 2 mg via INTRAVENOUS

## 2013-07-10 MED ORDER — ONDANSETRON HCL 4 MG/2ML IJ SOLN
4.0000 mg | Freq: Once | INTRAMUSCULAR | Status: AC
  Administered 2013-07-10: 4 mg via INTRAVENOUS

## 2013-07-10 MED ORDER — ROCURONIUM BROMIDE 100 MG/10ML IV SOLN
INTRAVENOUS | Status: DC | PRN
  Administered 2013-07-10: 30 mg via INTRAVENOUS

## 2013-07-10 SURGICAL SUPPLY — 48 items
APL SKNCLS STERI-STRIP NONHPOA (GAUZE/BANDAGES/DRESSINGS) ×1
APPLIER CLIP UNV 5X34 EPIX (ENDOMECHANICALS) ×2 IMPLANT
APR XCLPCLP 20M/L UNV 34X5 (ENDOMECHANICALS) ×1
BAG HAMPER (MISCELLANEOUS) ×2 IMPLANT
BAG SPEC RTRVL LRG 6X4 10 (ENDOMECHANICALS) ×1
BENZOIN TINCTURE PRP APPL 2/3 (GAUZE/BANDAGES/DRESSINGS) ×2 IMPLANT
CLOTH BEACON ORANGE TIMEOUT ST (SAFETY) ×2 IMPLANT
COVER LIGHT HANDLE STERIS (MISCELLANEOUS) ×4 IMPLANT
DECANTER SPIKE VIAL GLASS SM (MISCELLANEOUS) ×2 IMPLANT
DEVICE TROCAR PUNCTURE CLOSURE (ENDOMECHANICALS) ×2 IMPLANT
DURAPREP 26ML APPLICATOR (WOUND CARE) ×2 IMPLANT
ELECT REM PT RETURN 9FT ADLT (ELECTROSURGICAL) ×2
ELECTRODE REM PT RTRN 9FT ADLT (ELECTROSURGICAL) ×1 IMPLANT
FILTER SMOKE EVAC LAPAROSHD (FILTER) ×2 IMPLANT
FORMALIN 10 PREFIL 120ML (MISCELLANEOUS) ×2 IMPLANT
GLOVE BIOGEL PI IND STRL 7.0 (GLOVE) ×1 IMPLANT
GLOVE BIOGEL PI IND STRL 7.5 (GLOVE) ×2 IMPLANT
GLOVE BIOGEL PI INDICATOR 7.0 (GLOVE) ×1
GLOVE BIOGEL PI INDICATOR 7.5 (GLOVE) ×2
GLOVE ECLIPSE 7.0 STRL STRAW (GLOVE) ×2 IMPLANT
GLOVE SKINSENSE NS SZ7.0 (GLOVE) ×2
GLOVE SKINSENSE NS SZ7.5 (GLOVE) ×1
GLOVE SKINSENSE STRL SZ7.0 (GLOVE) IMPLANT
GLOVE SKINSENSE STRL SZ7.5 (GLOVE) ×1 IMPLANT
GOWN STRL REIN XL XLG (GOWN DISPOSABLE) ×6 IMPLANT
HEMOSTAT SNOW SURGICEL 2X4 (HEMOSTASIS) ×2 IMPLANT
INST SET LAPROSCOPIC AP (KITS) ×2 IMPLANT
IV NS IRRIG 3000ML ARTHROMATIC (IV SOLUTION) IMPLANT
KIT ROOM TURNOVER APOR (KITS) ×2 IMPLANT
MANIFOLD NEPTUNE II (INSTRUMENTS) ×2 IMPLANT
NDL INSUFFLATION 14GA 120MM (NEEDLE) ×1 IMPLANT
NEEDLE INSUFFLATION 14GA 120MM (NEEDLE) ×2 IMPLANT
PACK LAP CHOLE LZT030E (CUSTOM PROCEDURE TRAY) ×2 IMPLANT
PAD ARMBOARD 7.5X6 YLW CONV (MISCELLANEOUS) ×2 IMPLANT
POUCH SPECIMEN RETRIEVAL 10MM (ENDOMECHANICALS) ×2 IMPLANT
SET BASIN LINEN APH (SET/KITS/TRAYS/PACK) ×2 IMPLANT
SET TUBE IRRIG SUCTION NO TIP (IRRIGATION / IRRIGATOR) IMPLANT
SLEEVE Z-THREAD 5X100MM (TROCAR) ×4 IMPLANT
STRIP CLOSURE SKIN 1/2X4 (GAUZE/BANDAGES/DRESSINGS) ×2 IMPLANT
SUT MNCRL AB 4-0 PS2 18 (SUTURE) ×4 IMPLANT
SUT VIC AB 2-0 CT1 27 (SUTURE) ×2
SUT VIC AB 2-0 CT1 TAPERPNT 27 (SUTURE) IMPLANT
SUT VIC AB 2-0 CT2 27 (SUTURE) ×2 IMPLANT
TROCAR ENDO BLADELESS 11MM (ENDOMECHANICALS) ×2 IMPLANT
TROCAR Z-THRD FIOS HNDL 11X100 (TROCAR) ×1 IMPLANT
TROCAR Z-THREAD FIOS 5X100MM (TROCAR) ×2 IMPLANT
TUBING INSUFFLATION (TUBING) ×2 IMPLANT
WARMER LAPAROSCOPE (MISCELLANEOUS) ×2 IMPLANT

## 2013-07-10 NOTE — Interval H&P Note (Signed)
History and Physical Interval Note:  07/10/2013 8:47 AM  Cristina Davenport  has presented today for surgery, with the diagnosis of chronic cholecystitis  The various methods of treatment have been discussed with the patient and family. After consideration of risks, benefits and other options for treatment, the patient has consented to  Procedure(s): LAPAROSCOPIC CHOLECYSTECTOMY (N/A) as a surgical intervention .  The patient's history has been reviewed, patient examined, no change in status, stable for surgery.  I have reviewed the patient's chart and labs.  Questions were answered to the patient's satisfaction.     Nithin Demeo C

## 2013-07-10 NOTE — H&P (Signed)
NTS SOAP Note  Vital Signs:  Vitals as of: 05/28/2013: Systolic 105: Diastolic 66: Heart Rate 68: Temp 99.63F: Height 22ft 1in: Weight 145Lbs 0 Ounces: Pain Level 5: BMI 27.4  BMI : 27.4 kg/m2  Subjective: This 32 Years 33 Months old Female presents for of  right upper quadrant and epigastric abdominal tenderness. Radiates occasionally to the left side. Mostly radiates to the back. Increase with foods especially fatty greasy foods. She has had nausea with occasional emesis. No history of jaundice. No fevers or chills. Positive family history for Crohn's disease but no history of biliary disease. No significant change with her bowel movements although she does have frequent episodes of diarrhea. She has had endoscopy performed by [facility] gastroenterology.   Positive hiatal hernia noted otherwise unremarkable endoscopy. She has undergone a  HIDA scan with noted increased in the right upper quadrant tenderness. She described as dull. Not all of her symptomatology was exacerbated with the administration of the Ensure Plus.  Review of Symptoms:  Constitutional:unremarkable   Head:unremarkable    Eyes:unremarkable   Nose/Mouth/Throat:unremarkable Cardiovascular:  unremarkable   Respiratory:unremarkable   Gastrointestinal:  unremarkable        As per history of present illness Musculoskeletal:unremarkable   Skin:unremarkable Breast:unremarkable   Hematolgic/Lymphatic:unremarkable     Allergic/Immunologic:unremarkable     Past Medical History:  Obtained  Reviewed   Past Medical History  Pregnancy Gravida: 1 Pregnancy Para: 1 Surgical History: gluteal wound repair (complex), abdominal cyst removal Medical Problems: GERD Psychiatric History: bipolar 1 disorder, anxiety Allergies: latex Medications: Klonopin, Lexapro, Lamictal, Marena,  Zofran, Protonix, Carafate   Social History:Reviewed  Social History  Preferred Language:  English Race:  White Ethnicity: Not Hispanic / Latino Age: 27 Years 0 Months Marital Status:  M Alcohol: occasional Recreational drug(s): none   Smoking Status: Current every day smoker reviewed on 05/29/2013 Started Date: 11/14/2000 Packs per day: 0.25 Functional Status reviewed on mm/dd/yyyy ------------------------------------------------ Bathing: Normal Cooking: Normal Dressing: Normal Driving: Normal Eating: Normal Managing Meds: Normal Oral Care: Normal Shopping: Normal Toileting: Normal Transferring: Normal Walking: Normal Cognitive Status reviewed on mm/dd/yyyy ------------------------------------------------ Attention: Normal Decision Making: Normal Language: Normal Memory: Normal Motor: Normal Perception: Normal Problem Solving: Normal Visual and Spatial: Normal   Family History:  Reviewed  Family Health History Mother  Father  Other Family Member, Living; Healthy; Crohn's disease, hypertension    Objective Information: General:  Well appearing, well nourished in no distress.   healthy Skin:     no rash or prominent lesions Head:Atraumatic; no masses; no abnormalities Eyes:  conjunctiva clear, EOM intact, PERRL Mouth:  Mucous membranes moist, no mucosal lesions. Neck:  Supple without lymphadenopathy.  Heart:  RRR, no murmur Lungs:    CTA bilaterally, no wheezes, rhonchi, rales.  Breathing unlabored. Abdomen:Soft, ND, no HSM, no masses.   mild right upper quadrant abdominal wall tenderness. No classic Murphy sign. Some left lower quadrant tenderness. Extremities:  No deformities, clubbing, cyanosis, or edema.       HIDA scan: 97% ejection fracture. Some increase in symptomatology Assessment:    Plan:  chronic cholecystitis. At this time I had a long conversation with the patient regarding her symptomatology and the test results. While based on her ejection fracture there is no evidence of biliary  dyskinesia, her symptomatology being exacerbated with the Ensure Plus does make me suspicious of some degree of a chronic cholecystitis. Surgical options were discussed. At this time patient does not feel her symptomatology warrants proceeding to the operating room. I  am comfortable with her  decision. Indications to call the office or proceed to the emergency department have been discussed. Patient will continue to consider her options and will call should she wish to proceed to the operating room.  Patient Education:Alternative treatments to surgery were discussed with patient (and family).  Risks and benefits  of procedure were fully explained to the patient (and family) who gave informed consent. Patient/family questions were addressed.  Follow-up:Pending Surgery,PRN

## 2013-07-10 NOTE — Transfer of Care (Signed)
Immediate Anesthesia Transfer of Care Note  Patient: Cristina Davenport  Procedure(s) Performed: Procedure(s): LAPAROSCOPIC CHOLECYSTECTOMY (N/A)  Patient Location: PACU  Anesthesia Type:General  Level of Consciousness: awake, alert , oriented and patient cooperative  Airway & Oxygen Therapy: Patient Spontanous Breathing and Patient connected to face mask oxygen  Post-op Assessment: Report given to PACU RN and Post -op Vital signs reviewed and stable  Post vital signs: Reviewed and stable  Complications: No apparent anesthesia complications

## 2013-07-10 NOTE — Preoperative (Signed)
Beta Blockers   Reason not to administer Beta Blockers:Not Applicable 

## 2013-07-10 NOTE — Anesthesia Procedure Notes (Signed)
Procedure Name: Intubation Date/Time: 07/10/2013 9:00 AM Performed by: Carolyne Littles, Marquise Lambson L Pre-anesthesia Checklist: Patient identified, Patient being monitored, Timeout performed, Emergency Drugs available and Suction available Patient Re-evaluated:Patient Re-evaluated prior to inductionOxygen Delivery Method: Circle System Utilized Preoxygenation: Pre-oxygenation with 100% oxygen Intubation Type: IV induction, Rapid sequence and Cricoid Pressure applied Laryngoscope Size: 3 and Miller Grade View: Grade I Tube type: Oral Tube size: 7.0 mm Number of attempts: 1 Airway Equipment and Method: stylet Placement Confirmation: ETT inserted through vocal cords under direct vision,  positive ETCO2 and breath sounds checked- equal and bilateral Secured at: 21 cm Tube secured with: Tape Dental Injury: Teeth and Oropharynx as per pre-operative assessment

## 2013-07-10 NOTE — Anesthesia Preprocedure Evaluation (Signed)
Anesthesia Evaluation  Patient identified by MRN, date of birth, ID band Patient awake    Reviewed: Allergy & Precautions, H&P , NPO status , Patient's Chart, lab work & pertinent test results, reviewed documented beta blocker date and time   History of Anesthesia Complications (+) Emergence Delirium  Airway Mallampati: I TM Distance: >3 FB Neck ROM: full    Dental  (+) Teeth Intact   Pulmonary Current Smoker,  breath sounds clear to auscultation        Cardiovascular Exercise Tolerance: Good negative cardio ROS  Rhythm:Regular Rate:Normal     Neuro/Psych  Headaches, PSYCHIATRIC DISORDERS Anxiety Bipolar Disorder    GI/Hepatic negative GI ROS, Neg liver ROS, GERD-  Medicated,  Endo/Other  negative endocrine ROS  Renal/GU      Musculoskeletal   Abdominal   Peds  Hematology   Anesthesia Other Findings   Reproductive/Obstetrics                           Anesthesia Physical Anesthesia Plan  ASA: II  Anesthesia Plan: General   Post-op Pain Management:    Induction: Intravenous, Rapid sequence and Cricoid pressure planned  Airway Management Planned: Oral ETT  Additional Equipment:   Intra-op Plan:   Post-operative Plan: Extubation in OR  Informed Consent: I have reviewed the patients History and Physical, chart, labs and discussed the procedure including the risks, benefits and alternatives for the proposed anesthesia with the patient or authorized representative who has indicated his/her understanding and acceptance.     Plan Discussed with:   Anesthesia Plan Comments:         Anesthesia Quick Evaluation

## 2013-07-10 NOTE — Op Note (Signed)
Patient:  Cristina Davenport  DOB:  04-23-1986  MRN:  [id number]   Preop Diagnosis:  Biliary dyskinesia  Postop Diagnosis:  The same  Procedure:  Laparoscopic cholecystectomy  Surgeon:  Dr. Tilford Pillar  Anes:  General endotracheal, 0.5% Sensorcaine plain for local  Indications:  Patient is a 27 year old female presented my office with a history of epigastric and right upper quadrant abdominal pain. Workup and evaluation was consistent for biliary dyskinesia. Risks benefits alternatives a laparoscopic possible open cholecystectomy were discussed at length patient including but not limited to risk of bleeding, infection, bile leak, small bowel injury, common bile duct injury, intraoperative cardiac and pulmonary events. Patient's questions and concerns are addressed the patient as consented for the planned procedure.  Procedure note:  Patient is taken to the operator is placed in supine position the or table time the general anesthetic is administered. Once patient was asleep she is in endotracheally intubated by the nurse anesthetist. At this point her abdomen is prepped with DuraPrep solution and draped in standard fashion. Time out was performed. Stab incision was created infraumbilically with 11 blade scalpel with additional dissection down to subcuticular tissue carried out using a Coker clamp which he utilized grasp the anterior normal fascia and lift this anteriorly. A Veress needle is inserted saline drop test is utilized confirm intraperitoneal placement and then the pneumoperitoneum was initiated. Once sufficient pneumoperitoneum was obtained an 11 mm trochars inserted over laparoscopic allowing visualization the trocar entering into the peritoneal cavity. This point in the cannulas removed lap scope was reinserted there is no evidence a trocar peritoneal placement injury. Patient's placed into a reverse Trendelenburg left lateral decubitus position the remaining trochars replaced a 5 mm can  epigastrium, 5 monitored in the midline, and a 5 mm in the right lower abdominal wall. Since the fundus of the gallbladder is grasped and lifted up and over the right lobe the liver. The peritoneal reflection onto the infundibulum is bluntly stripped using a [place] exposing both the cystic duct and cystic artery is entered into the infundibulum. A window was created by both the structures. 3 endoclips placed proximally one distally and the cystic duct and cystic duct was divided between 2 most distal clips. Similarly the cystic artery is ligated with 2 endoclips proximally one distally and cystic arteries divided between 2 most distal clips. Electrocautery is in utilized dissect the collar free from the gallbladder fossa during this dissection hemostasis is obtained using electrocautery. A small cholecystotomy was created during this dissection a small amount bile spillage this was quickly controlled with a grasper. Gallbladder is then placed into an Endo Catch bag was free from the gallbladder fossa which is placed into the right lower quadrant. To facilitate this the 10 mm scope is exchanged for a 5 mm scope. The spilled bile was quickly aspirated. The gallbladder fossa was inspected hemostasis is excellent. The endoclips were inspected there is no evidence of any bleeding or bile leak. At this time attention was turned to closure.  Using an Endo Close suture passing device a 2-0 Vicryl sutures passed to the umbilical trocar site. With this suture and placed the gallbladder is treated and removed through the umbilical site and intact Endo Catch bag. It is placed in the back table sent as a perm specimen to pathology. The pneumoperitoneum was evacuated. Trochars removed. The Vicryl sutures secured. Local anesthetic is instilled. A 4 Monocryl utilized reapproximate skin edges at all 4 trocar sites. Skin was washed dried moist  dry towel. Benzoin is applied around incision. Half-inch are suture placed.  The drapes removed patient left come out of general anesthetic. She is transferred to the PACU in stable condition. At the conclusion of procedure all instrument, sponge, needle counts are correct. Patient tolerated procedure extremely well.  Complications:  None apparent  EBL:  Minimal  Specimen:  Gallbladder

## 2013-07-10 NOTE — Anesthesia Postprocedure Evaluation (Signed)
  Anesthesia Post-op Note  Patient: Cristina Davenport  Procedure(s) Performed: Procedure(s): LAPAROSCOPIC CHOLECYSTECTOMY (N/A)  Patient Location: PACU  Anesthesia Type:General  Level of Consciousness: awake, alert , oriented and patient cooperative  Airway and Oxygen Therapy: Patient Spontanous Breathing and Patient connected to nasal cannula oxygen  Post-op Pain: moderate  Post-op Assessment: Post-op Vital signs reviewed, Patient's Cardiovascular Status Stable, Respiratory Function Stable, Patent Airway, No signs of Nausea or vomiting and Pain level controlled  Post-op Vital Signs: Reviewed and stable  Complications: No apparent anesthesia complications

## 2013-07-11 ENCOUNTER — Encounter (HOSPITAL_COMMUNITY): Payer: Self-pay | Admitting: General Surgery

## 2013-07-19 ENCOUNTER — Encounter (HOSPITAL_COMMUNITY): Payer: Self-pay | Admitting: *Deleted

## 2013-07-19 ENCOUNTER — Emergency Department (HOSPITAL_COMMUNITY)
Admission: EM | Admit: 2013-07-19 | Discharge: 2013-07-19 | Disposition: A | Discharge status: Home / Self Care | Payer: BC Managed Care – PPO | Attending: Emergency Medicine | Admitting: Emergency Medicine

## 2013-07-19 ENCOUNTER — Emergency Department (HOSPITAL_COMMUNITY): Payer: BC Managed Care – PPO

## 2013-07-19 ENCOUNTER — Other Ambulatory Visit: Payer: Self-pay | Admitting: *Deleted

## 2013-07-19 ENCOUNTER — Other Ambulatory Visit: Payer: Self-pay | Admitting: Family Medicine

## 2013-07-19 DIAGNOSIS — Z8719 Personal history of other diseases of the digestive system: Secondary | ICD-10-CM | POA: Insufficient documentation

## 2013-07-19 DIAGNOSIS — Z9089 Acquired absence of other organs: Secondary | ICD-10-CM | POA: Insufficient documentation

## 2013-07-19 DIAGNOSIS — Z3202 Encounter for pregnancy test, result negative: Secondary | ICD-10-CM | POA: Insufficient documentation

## 2013-07-19 DIAGNOSIS — F411 Generalized anxiety disorder: Secondary | ICD-10-CM | POA: Insufficient documentation

## 2013-07-19 DIAGNOSIS — R110 Nausea: Secondary | ICD-10-CM | POA: Insufficient documentation

## 2013-07-19 DIAGNOSIS — G8918 Other acute postprocedural pain: Secondary | ICD-10-CM

## 2013-07-19 DIAGNOSIS — R1013 Epigastric pain: Secondary | ICD-10-CM | POA: Insufficient documentation

## 2013-07-19 DIAGNOSIS — Z91040 Latex allergy status: Secondary | ICD-10-CM | POA: Insufficient documentation

## 2013-07-19 DIAGNOSIS — G43909 Migraine, unspecified, not intractable, without status migrainosus: Secondary | ICD-10-CM | POA: Insufficient documentation

## 2013-07-19 DIAGNOSIS — Z79899 Other long term (current) drug therapy: Secondary | ICD-10-CM | POA: Insufficient documentation

## 2013-07-19 DIAGNOSIS — Z87891 Personal history of nicotine dependence: Secondary | ICD-10-CM | POA: Insufficient documentation

## 2013-07-19 DIAGNOSIS — F319 Bipolar disorder, unspecified: Secondary | ICD-10-CM | POA: Insufficient documentation

## 2013-07-19 LAB — CBC WITH DIFFERENTIAL/PLATELET
Basophils Absolute: 0 10*3/uL (ref 0.0–0.1)
Basophils Relative: 0 % (ref 0–1)
Eosinophils Absolute: 0.2 10*3/uL (ref 0.0–0.7)
Eosinophils Relative: 3 % (ref 0–5)
HCT: 37.9 % (ref 36.0–46.0)
Hemoglobin: 13 g/dL (ref 12.0–15.0)
Lymphocytes Relative: 25 % (ref 12–46)
Lymphs Abs: 2.1 10*3/uL (ref 0.7–4.0)
MCH: 30.7 pg (ref 26.0–34.0)
MCHC: 34.3 g/dL (ref 30.0–36.0)
MCV: 89.4 fL (ref 78.0–100.0)
Monocytes Absolute: 0.8 10*3/uL (ref 0.1–1.0)
Monocytes Relative: 10 % (ref 3–12)
Neutro Abs: 5.1 10*3/uL (ref 1.7–7.7)
Neutrophils Relative %: 62 % (ref 43–77)
Platelets: 254 10*3/uL (ref 150–400)
RBC: 4.24 MIL/uL (ref 3.87–5.11)
RDW: 12.8 % (ref 11.5–15.5)
WBC: 8.2 10*3/uL (ref 4.0–10.5)

## 2013-07-19 LAB — URINALYSIS, ROUTINE W REFLEX MICROSCOPIC
Bilirubin Urine: NEGATIVE
Glucose, UA: NEGATIVE mg/dL
Hgb urine dipstick: NEGATIVE
Ketones, ur: NEGATIVE mg/dL
Leukocytes, UA: NEGATIVE
Nitrite: NEGATIVE
Protein, ur: NEGATIVE mg/dL
Specific Gravity, Urine: 1.02 (ref 1.005–1.030)
Urobilinogen, UA: 1 mg/dL (ref 0.0–1.0)
pH: 6.5 (ref 5.0–8.0)

## 2013-07-19 LAB — COMPREHENSIVE METABOLIC PANEL
ALT: 24 U/L (ref 0–35)
AST: 19 U/L (ref 0–37)
Albumin: 4.3 g/dL (ref 3.5–5.2)
Alkaline Phosphatase: 57 U/L (ref 39–117)
BUN: 14 mg/dL (ref 6–23)
CO2: 26 mEq/L (ref 19–32)
Calcium: 9.6 mg/dL (ref 8.4–10.5)
Chloride: 106 mEq/L (ref 96–112)
Creatinine, Ser: 0.71 mg/dL (ref 0.50–1.10)
GFR calc Af Amer: >90 mL/min (ref >90)
GFR calc non Af Amer: >90 mL/min (ref >90)
Glucose, Bld: 85 mg/dL (ref 70–99)
Potassium: 3.9 mEq/L (ref 3.5–5.1)
Sodium: 141 mEq/L (ref 135–145)
Total Bilirubin: 0.4 mg/dL (ref 0.3–1.2)
Total Protein: 7.2 g/dL (ref 6.0–8.3)

## 2013-07-19 LAB — LIPASE, BLOOD: Lipase: 54 U/L (ref 11–59)

## 2013-07-19 LAB — PREGNANCY, URINE: Preg Test, Ur: NEGATIVE

## 2013-07-19 IMAGING — CT CT ANGIO CHEST
3 of 9 series · 10 of 46 positions shown · IV contrast (Omnipaque 300)
Comparison: None.

CLINICAL DATA: Pleuritic chest

CT ANGIOGRAPHY CHEST
TECHNIQUE: Multidetector CT imaging of the chest using the
standard protocol during bolus administration of intravenous
contrast. Multiplanar reconstructed images including MIPs were
obtained and reviewed to evaluate the vascular anatomy.
Contrast: 50mL OMNIPAQUE IOHEXOL 300 MG/ML  SOLN, 100mL OMNIPAQUE
IOHEXOL 350 MG/ML SOLN pain

[Series 4: pe 3.0 b40f · axial · 0.59mm/px · z∈[+796,+982]mm · 5 of 94 slices shown]
[im 16/94  lung]
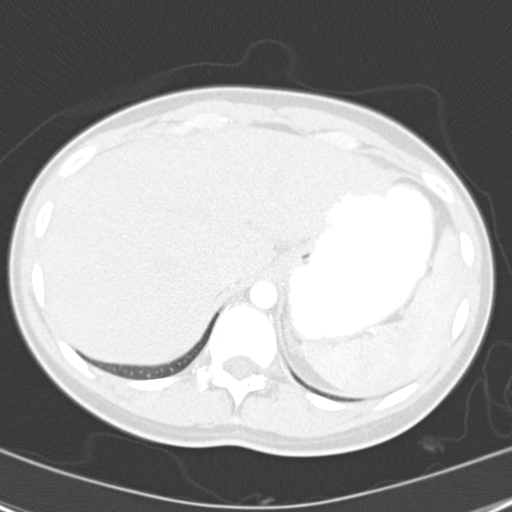
[im 32/94  soft-tissue]
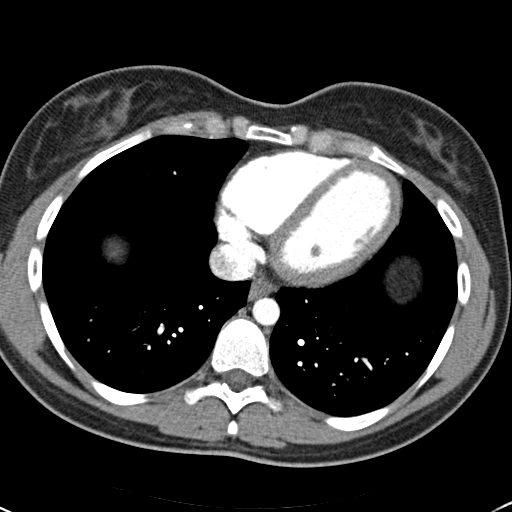
[im 47/94  lung]
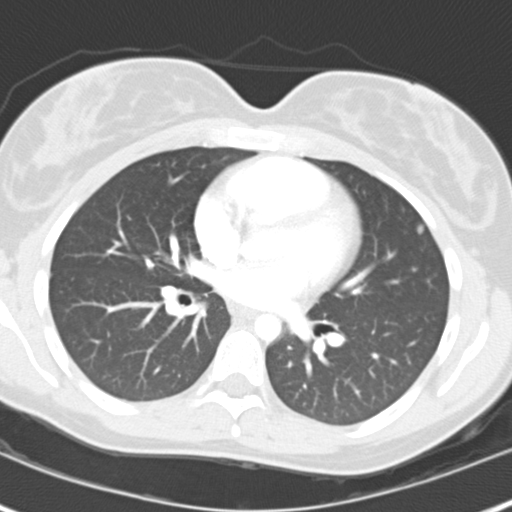
[im 63/94  soft-tissue]
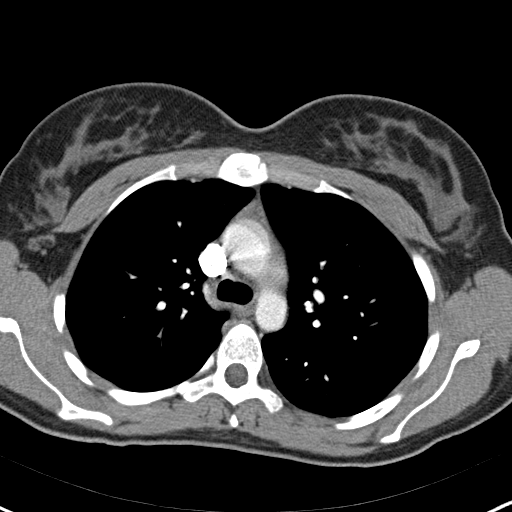
[im 78/94  lung]
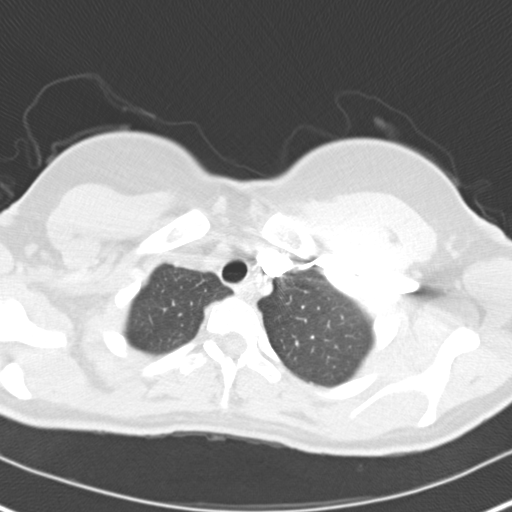

[Series 6: mpr coronal pe · coronal · 0.56mm/px · 1 of 77 slices shown]
[im 39/77  soft-tissue]
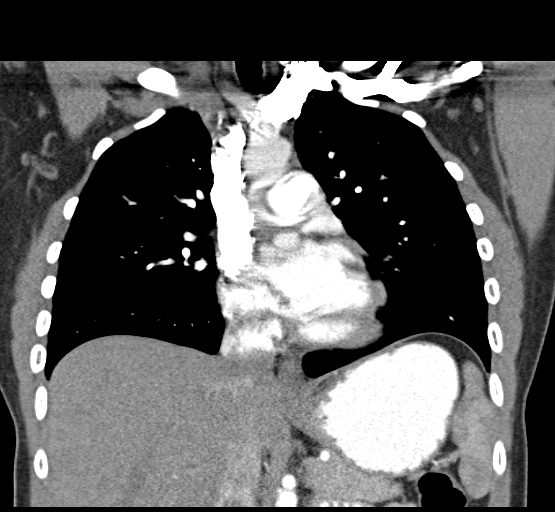

[Series 10: abd_pel 5.0 b40f · axial · 0.60mm/px · z∈[+496,+766]mm · 4 of 90 slices shown]
[im 18/90  lung]
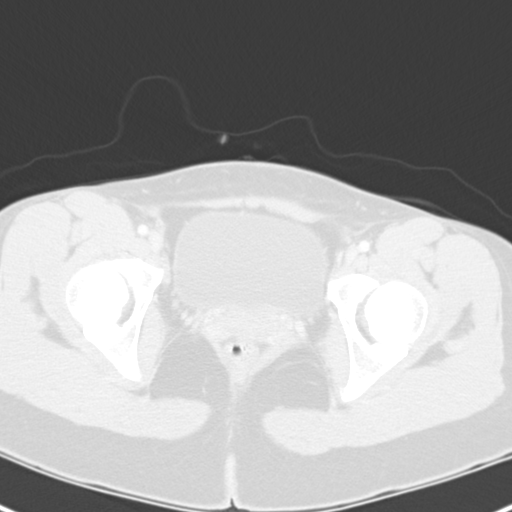
[im 36/90  lung]
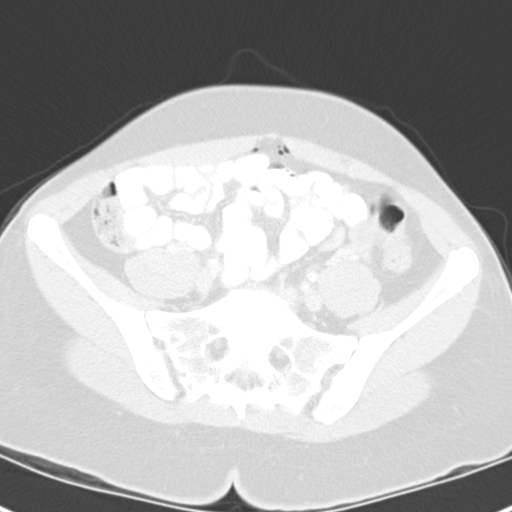
[im 54/90  lung]
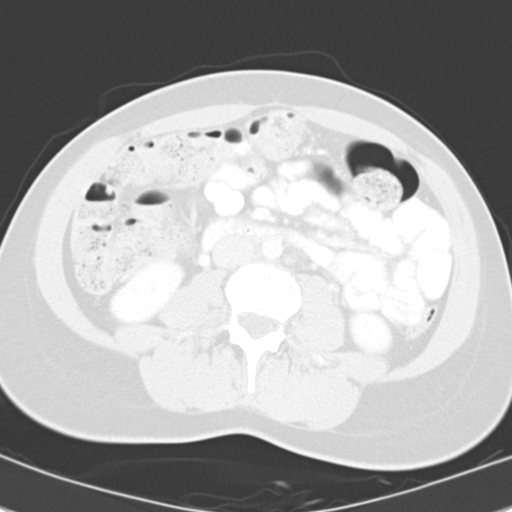
[im 72/90  lung]
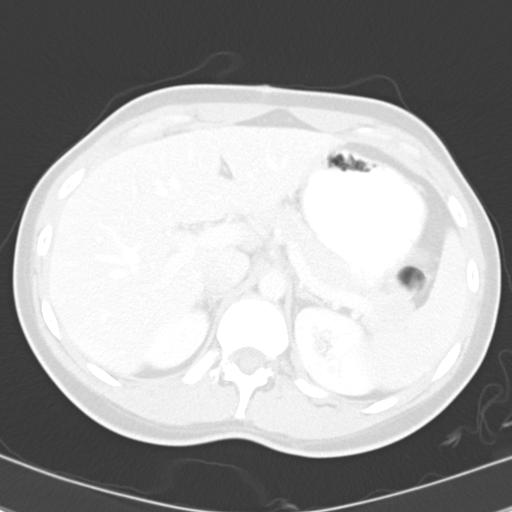

[10 of 46 positions shown; findings below may reference images not displayed]

FINDINGS: No filling defects of the pulmonary suggest acute
pulmonary embolism.  No acute findings of the aorta or great
vessels.  Small amount residual thymus and anterior mediastinum.
No pericardial fluid.  No mediastinal lymphadenopathy.  Esophagus
normal.

No axillary supraclavicular adenopathy.

Review lung parenchyma demonstrates no acute infection or
pneumothorax.  There is a noncalcified pulmonary nodule in the
lingula measuring 5 mm (image 48, series 5).

Review of the upper abdomen is unremarkable.

Review of the skeleton is unremarkable.
IMPRESSION: 1..  No evidence acute pulmonary embolism.
2.  Noncalcified pulmonary nodule within the lingula. In this age
group, this is likely a benign finding; however with smoking
history recommend follow-up CT without contrast in 1 year.

## 2013-07-19 IMAGING — NM NM HEPATOBILIARY IMAGE, INC GB
1 series · 6 of 6 positions shown · non-contrast
Comparison: [DATE]

CLINICAL DATA: Right upper quadrant pain, post cholecystectomy
[DATE], question bile leak

NUCLEAR MEDICINE HEPATOHBILIARY INCLUDE GB
Radiopharmaceutical:  5 mCi [37] mebrofenin
TECHNIQUE: Following injection of tracer, imaging was performed
for 60 minutes.

[gb hepatobiliary scan · 3.19mm/px · 6 of 60 frames shown]
[frame 6/60]
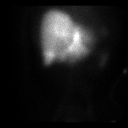
[frame 16/60]
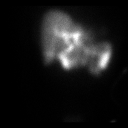
[frame 26/60]
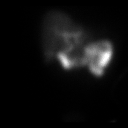
[frame 36/60]
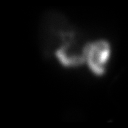
[frame 46/60]
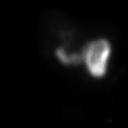
[frame 56/60]
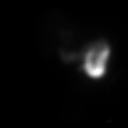

[6 of 6 positions shown; findings below may reference images not displayed]

FINDINGS: Normal tracer extraction by bloodstream indicating normal
hepatocellular function.
Prompt excretion of tracer into the biliary tree.
Small bowel visualized by 8 minutes.
No focal hepatic retention of tracer.
Gallbladder surgically absent.
No abnormal accumulation of tracer is identified in the gallbladder
fossa or in the perihepatic region to suggest bile leak.
IMPRESSION: Negative HIDA scan for bile leak.
Normal exam.

## 2013-07-19 MED ORDER — OXYCODONE-ACETAMINOPHEN 5-325 MG PO TABS
2.0000 | ORAL_TABLET | Freq: Once | ORAL | Status: AC
  Administered 2013-07-19: 2 via ORAL
  Filled 2013-07-19: qty 2

## 2013-07-19 MED ORDER — OXYCODONE-ACETAMINOPHEN 5-325 MG PO TABS: 2.0000 | ORAL_TABLET | ORAL | Status: DC | PRN

## 2013-07-19 MED ORDER — IOHEXOL 300 MG/ML  SOLN
50.0000 mL | Freq: Once | INTRAMUSCULAR | Status: AC | PRN
  Administered 2013-07-19: 50 mL via ORAL

## 2013-07-19 MED ORDER — IOHEXOL 350 MG/ML SOLN
100.0000 mL | Freq: Once | INTRAVENOUS | Status: AC | PRN
  Administered 2013-07-19: 100 mL via INTRAVENOUS

## 2013-07-19 MED ORDER — CLONAZEPAM 0.5 MG PO TABS: 0.5000 mg | ORAL_TABLET | Freq: Two times a day (BID) | ORAL | Status: DC | PRN

## 2013-07-19 MED ORDER — TECHNETIUM TC 99M MEBROFENIN IV KIT
5.0000 | PACK | Freq: Once | INTRAVENOUS | Status: AC | PRN
  Administered 2013-07-19: 5 via INTRAVENOUS

## 2013-07-19 MED ORDER — SODIUM CHLORIDE 0.9 % IV BOLUS (SEPSIS)
1000.0000 mL | Freq: Once | INTRAVENOUS | Status: AC
  Administered 2013-07-19: 1000 mL via INTRAVENOUS

## 2013-07-19 MED ORDER — MORPHINE SULFATE 4 MG/ML IJ SOLN
4.0000 mg | Freq: Once | INTRAMUSCULAR | Status: AC
  Administered 2013-07-19: 4 mg via INTRAVENOUS
  Filled 2013-07-19: qty 1

## 2013-07-19 MED ORDER — ONDANSETRON HCL 4 MG/2ML IJ SOLN
4.0000 mg | Freq: Once | INTRAMUSCULAR | Status: AC
  Administered 2013-07-19: 4 mg via INTRAVENOUS
  Filled 2013-07-19: qty 2

## 2013-07-19 NOTE — Telephone Encounter (Signed)
Ok plus two monthly ref 

## 2013-07-19 NOTE — ED Notes (Signed)
Patient would like something for pain. MD notified.

## 2013-07-19 NOTE — ED Notes (Signed)
Pt ambulated to restroom at this time. Cristina Davenport 

## 2013-07-19 NOTE — ED Provider Notes (Signed)
CSN: [id number]     Arrival date & time 07/19/13  1458 History  This chart was scribed for Cristina Octave, MD by Bennett Scrape, ED Scribe. This patient was seen in room [facility] and the patient's care was started at 3:26 PM.    Chief Complaint  Patient presents with  . Abdominal Pain    The history is provided by the patient. No language interpreter was used.    HPI Comments: Cristina Davenport is a 27 y.o. female who presents to the Emergency Department complaining of RUQ abdominal pain that radiates into the bilateral abdomen that has been present since a cholecystectomy done by Dr. Caesar Bookman on 07/10/13. She lists nausea as an associated symptom and reports that the pain is aggravated with deep breathing and laying down. She states that the pain has been present since the surgery but has become more intense over the past 3 days. She denies similarities prior to the surgery stating that she had pain in her epigastric region and pain with eating. She states that since the surgery, the epigastric region pain and the pain with eating have since subsided. She denies any recent falls or trauma. She admits that she has f/u with Dr. Caesar Bookman for the symptoms and was sent here upon his request to get a HIDAA scan performed. She denies emesis and fevers as associated symptoms. She states that she has been moving her bowels normally since the onset. She currently has an IUD in place. She has a h/o psych conditions but states that she is otherwise healthy.   Past Medical History  Diagnosis Date  . Anxiety   . Bipolar 1 disorder   . Anxiety   . GERD (gastroesophageal reflux disease)   . IBS (irritable bowel syndrome)   . Migraine without aura    Past Surgical History  Procedure Laterality Date  . Examination under anesthesia  04/21/2012    Gluteal wound repair  . Esophagogastroduodenoscopy N/A 04/30/2013    WUJ:[id number] distal esophagus of uncertain clinical significance-status post biopsy. Tiny hiatal  hernia. No explanation  . Cholecystectomy N/A 07/10/2013    Procedure: LAPAROSCOPIC CHOLECYSTECTOMY;  Surgeon: Fabio Bering, MD;  Location: [facility];  Service: General;  Laterality: N/A;  . Abdominal surgery      cyst removed    Family History  Problem Relation Age of Onset  . Hypertension Maternal Grandmother   . Stroke Maternal Grandmother   . Hypertension Maternal Grandfather   . Stroke Maternal Grandfather   . Crohn's disease Maternal Aunt   . Crohn's disease Cousin   . Colon cancer Neg Hx    History  Substance Use Topics  . Smoking status: Former Smoker -- 0.25 packs/day for 8 years    Types: Cigarettes  . Smokeless tobacco: Not on file     Comment: in process of quitting  . Alcohol Use: Yes     Comment: occasionally,  social   No OB history provided.  Review of Systems  A complete 10 system review of systems was obtained and all systems are negative except as noted in the HPI and PMH.   Allergies  Doxycycline and Latex  Home Medications   Current Outpatient Rx  Name  Route  Sig  Dispense  Refill  . clonazePAM (KLONOPIN) 0.5 MG tablet   Oral   Take 1 tablet (0.5 mg total) by mouth 2 (two) times daily as needed. anxiety   60 tablet   2   . escitalopram (LEXAPRO) 20 MG tablet  Oral   Take 20 mg by mouth every morning.         Marland Kitchen HYDROcodone-acetaminophen (NORCO) 5-325 MG per tablet   Oral   Take 1-2 tablets by mouth every 4 (four) hours as needed for pain.   45 tablet   0   . ibuprofen (ADVIL,MOTRIN) 200 MG tablet   Oral   Take 400 mg by mouth every 6 (six) hours as needed for pain.         . Ibuprofen-Diphenhydramine HCl (ADVIL PM) 200-25 MG CAPS   Oral   Take 1 tablet by mouth at bedtime as needed (FOR SLEEP).         Marland Kitchen lamoTRIgine (LAMICTAL) 100 MG tablet   Oral   Take 100 mg by mouth daily.         . ondansetron (ZOFRAN-ODT) 8 MG disintegrating tablet   Oral   Take 1 tablet (8 mg total) by mouth every 8 (eight) hours as needed for  nausea.   30 tablet   2   . sucralfate (CARAFATE) 1 G tablet   Oral   Take 1 g by mouth 4 (four) times daily. TO TAKE BEFORE MEALS AND AT BEDTIME         . levonorgestrel (MIRENA) 20 MCG/24HR IUD   Intrauterine   1 each by Intrauterine route once.         Marland Kitchen oxyCODONE-acetaminophen (PERCOCET/ROXICET) 5-325 MG per tablet   Oral   Take 2 tablets by mouth every 4 (four) hours as needed for pain.   15 tablet   0    Triage Vitals: BP 115/68  Pulse 73  Temp(Src) 98.4 F (36.9 C) (Oral)  Resp 20  Ht 5\' 1"  (1.549 m)  Wt 140 lb (63.504 kg)  BMI 26.47 kg/m2  SpO2 100%  Physical Exam  Nursing note and vitals reviewed. Constitutional: She is oriented to person, place, and time. She appears well-developed and well-nourished. No distress.  HENT:  Head: Normocephalic and atraumatic.  Eyes: EOM are normal.  Neck: Neck supple. No tracheal deviation present.  Cardiovascular: Normal rate and regular rhythm.   No murmur heard. Pulmonary/Chest: Effort normal and breath sounds normal. No respiratory distress.  Abdominal: She exhibits distension. There is tenderness (RUQ tenderness). There is guarding.  Surgical incisions are healing well  Musculoskeletal: Normal range of motion.  Intact peripheral pulses, no peripheral edema  Neurological: She is alert and oriented to person, place, and time.  Skin: Skin is warm and dry.  Psychiatric: She has a normal mood and affect. Her behavior is normal.    ED Course  Procedures (including critical care time)  Medications  sodium chloride 0.9 % bolus 1,000 mL (0 mLs Intravenous Stopped 07/19/13 2114)  morphine 4 MG/ML injection 4 mg (4 mg Intravenous Given 07/19/13 1539)  ondansetron (ZOFRAN) injection 4 mg (4 mg Intravenous Given 07/19/13 1539)  technetium TC 61M mebrofenin (CHOLETEC) injection 5 milli Curie (5 milli Curies Intravenous Contrast Given 07/19/13 1550)  iohexol (OMNIPAQUE) 300 MG/ML solution 50 mL (50 mLs Oral Contrast Given 07/19/13  1746)  iohexol (OMNIPAQUE) 350 MG/ML injection 100 mL (100 mLs Intravenous Contrast Given 07/19/13 1836)  oxyCODONE-acetaminophen (PERCOCET/ROXICET) 5-325 MG per tablet 2 tablet (2 tablets Oral Given 07/19/13 2055)    DIAGNOSTIC STUDIES: Oxygen Saturation is 100% on room air, normal by my interpretation.    COORDINATION OF CARE: 3:29 PM-Discussed treatment plan which includes medications, NM liver func, CBC panel, CMP and UA with pt at bedside and pt  agreed to plan.  8:48 PM-Consult complete with Dr. Caesar Bookman, surgery. Patient case explained and discussed. Dr. Caesar Bookman agrees that pt is safe to be discharged home. Will f/u with the pt in office next week. Call ended at 8:50 PM. 9:00 PM-Informed pt of radiology and lab work results. Discussed discharge plan which includes pain medication with pt and pt agreed to plan. Pt declined antiemetic stating she had plenty at home. Also advised pt to follow up with Dr. Caesar Bookman within the next week and pt agreed. Addressed symptoms to return for with pt.   Labs Review Labs Reviewed  URINALYSIS, ROUTINE W REFLEX MICROSCOPIC - Abnormal; Notable for the following:    APPearance HAZY (*)    All other components within normal limits  CBC WITH DIFFERENTIAL  COMPREHENSIVE METABOLIC PANEL  LIPASE, BLOOD  PREGNANCY, URINE   Imaging Review Ct Angio Chest Pe W/cm &/or Wo Cm  07/19/2013   *RADIOLOGY REPORT*  Clinical Data: Pleuritic chest  CT ANGIOGRAPHY CHEST  Technique:  Multidetector CT imaging of the chest using the standard protocol during bolus administration of intravenous contrast. Multiplanar reconstructed images including MIPs were obtained and reviewed to evaluate the vascular anatomy.  Contrast: 50mL OMNIPAQUE IOHEXOL 300 MG/ML  SOLN, OMNIPAQUE IOHEXOL 350 MG/ML SOLN pain  Comparison: None.  Findings: No filling defects of the pulmonary suggest acute pulmonary embolism.  No acute findings of the aorta or great vessels.  Small amount residual thymus and  anterior mediastinum. No pericardial fluid.  No mediastinal lymphadenopathy.  Esophagus normal.  No axillary supraclavicular adenopathy.  Review lung parenchyma demonstrates no acute infection or pneumothorax.  There is a noncalcified pulmonary nodule in the lingula measuring 5 mm (image 48, series 5).  Review of the upper abdomen is unremarkable.  Review of the skeleton is unremarkable.  IMPRESSION:  1.  No evidence acute pulmonary embolism. 2.  Noncalcified pulmonary nodule within the lingula. In this age group, this is likely a benign finding; however with smoking history recommend follow-up CT without contrast in 1 year.   Original Report Authenticated By: Genevive Bi, M.D.   Nm Hepatobiliary Liver Func  07/19/2013   *RADIOLOGY REPORT*  Clinical Data: Right upper quadrant pain, post cholecystectomy 07/10/2013, question bile leak  NUCLEAR MEDICINE HEPATOHBILIARY INCLUDE GB  Radiopharmaceutical:  5 mCi Tc-41m mebrofenin  Comparison: 05/08/2013  Technique:  Following injection of tracer, imaging was performed for 60 minutes.  Findings: Normal tracer extraction by bloodstream indicating normal hepatocellular function. Prompt excretion of tracer into the biliary tree. Small bowel visualized by 8 minutes. No focal hepatic retention of tracer. Gallbladder surgically absent. No abnormal accumulation of tracer is identified in the gallbladder fossa or in the perihepatic region to suggest bile leak.  IMPRESSION: Negative HIDA scan for bile leak. Normal exam.   Original Report Authenticated By: Ulyses Southward, M.D.   Ct Abdomen Pelvis W Contrast  07/19/2013   CT  head pelvis is also performed benign and initially available. A port is as follows.  *RADIOLOGY REPORT*  Clinical Data:  Right upper quadrant pain recent cholecystectomy. The  CT ABDOMEN AND PELVIS WITH CONTRAST  Technique:  Multidetector CT imaging of the abdomen and pelvis was performed using the standard protocol following bolus administration of  intravenous contrast.  Contrast: Contrast Volume .  Comparison:   None.  Findings:  Lung bases are clear.  No pericardial fluid.  No focal hepatic lesion.  No biliary duct dilatation.  Small amount gas within the gallbladder fossa and small amount  fluid. Trace amount free air at the left lower quadrant along the intraperitoneal surface (image 67).  The pancreas, spleen, adrenal glands, and kidneys are normal.  The stomach, small bowel, appendix, cecum normal.  The colon and rectosigmoid colon are normal.  Abdominal aorta normal caliber.  No retroperitoneal periportal lymphadenopathy.  Small free fluid the pelvis.  IUD device within the uterine body. There is a  4 cm cyst associated with the right ovary as well as an enhancing follicle.  There is a larger 5.2 x 3.7 cyst associate with the left ovary.  This has simple fluid attenuation.  No aggressive osseous lesions.    IMPRESSION:  1.  Small amount fluid and gas in the gallbladder fossa without evidence of abscess or biloma. This is within tolerance  given recent surgery.  Small amount free air in the left lower quadrant also relates to surgery.  2.  Bilateral ovarian cysts likely represent a functional ovarian cysts. Large size of the left cyst (greater 5 cm) recommend follow- up ultrasound of the pelvis in  6 weeks.   Original Report Authenticated By: Genevive Bi, M.D.    MDM   1. Postoperative pain    Patient presents with right upper quadrant pain and nausea as been constant since her gallbladder surgery in August 27. Associated with nausea. No fevers or vomiting. It is worse with lying down, worse with deep breathing and worse with palpation. It is different than her preoperative pain.  LFTs normal. Lipase normal. HIDA scan is negative for bile leak. Discussed with Dr. Leticia Penna. He agrees with CT angiogram of the chest and like CT abdomen as well.  CT negative for pulmonary embolism. Abdominal findings discussed with Dr. Leticia Penna. Small amount of  fluid and gas the gallbladder fossa. White count is 8 normal. He doubts abscess. Patient is nontoxic-appearing and comfortable. She is stable for followup in his office next week. She'll need  an ultrasound of her ovaries in 6 weeks. Return precautions discussed.  I personally performed the services described in this documentation, which was scribed in my presence. The recorded information has been reviewed and is accurate.    Cristina Octave, MD 07/19/13 [phone]

## 2013-07-19 NOTE — ED Notes (Signed)
Patient complaining of back pain. Asking for ice pack, one given.

## 2013-07-19 NOTE — Telephone Encounter (Signed)
Last anxiety check up was 03/09/12

## 2013-07-19 NOTE — Telephone Encounter (Signed)
Seems to me recent no show? I may be wrong. When did we last sdee for this?

## 2013-07-19 NOTE — ED Notes (Addendum)
Pain RUQ .  Had Lap Chole on 8 /27 and cont to have pain .  Dr Caesar Bookman wants pt to have HIDAA scan.  He say she does not need to be seen by EDP

## 2013-10-03 ENCOUNTER — Other Ambulatory Visit: Payer: Self-pay | Admitting: Family Medicine

## 2013-11-12 ENCOUNTER — Other Ambulatory Visit: Payer: Self-pay | Admitting: Family Medicine

## 2013-11-12 NOTE — Telephone Encounter (Signed)
Last office visit 04/05/13

## 2013-12-16 ENCOUNTER — Encounter: Payer: Self-pay | Admitting: Family Medicine

## 2013-12-16 ENCOUNTER — Ambulatory Visit (INDEPENDENT_AMBULATORY_CARE_PROVIDER_SITE_OTHER): Payer: BC Managed Care – PPO | Admitting: Nurse Practitioner

## 2013-12-16 ENCOUNTER — Encounter: Payer: Self-pay | Admitting: Nurse Practitioner

## 2013-12-16 VITALS — BP 128/82 | Temp 98.2°F | Ht 61.0 in | Wt 153.2 lb

## 2013-12-16 DIAGNOSIS — J111 Influenza due to unidentified influenza virus with other respiratory manifestations: Secondary | ICD-10-CM

## 2013-12-16 MED ORDER — OSELTAMIVIR PHOSPHATE 75 MG PO CAPS: 75.0000 mg | ORAL_CAPSULE | Freq: Two times a day (BID) | ORAL | Status: DC

## 2013-12-16 MED ORDER — HYDROCODONE-HOMATROPINE 5-1.5 MG/5ML PO SYRP: 5.0000 mL | ORAL_SOLUTION | ORAL | Status: DC | PRN

## 2013-12-16 NOTE — Progress Notes (Signed)
Subjective:  Presents complaints of fever headache cough body aches sore throat that began about 48 hours ago. Left ear pain. Slight wheeze at times. Generalized fatigue. No vomiting diarrhea or abdominal pain. Taking fluids well. Voiding normal limit. Works at an orthopedic office.  Objective:   BP 128/82  Temp(Src) 98.2 F (36.8 C)  Ht 5\' 1"  (1.549 m)  Wt 153 lb 3.2 oz (69.491 kg)  BMI 28.96 kg/m2 NAD. Alert, oriented. Fatigued in appearance. TMs clear effusion, no erythema. Pharynx clear moist. Neck supple with mild soft anterior adenopathy. Lungs clear. Heart regular rate rhythm.  Assessment:Influenza  Plan: Meds ordered this encounter  Medications  . oseltamivir (TAMIFLU) 75 MG capsule    Sig: Take 1 capsule (75 mg total) by mouth 2 (two) times daily.    Dispense:  10 capsule    Refill:  0    Order Specific Question:  Supervising Provider    Answer:  Mikey Kirschner [[id number]]  . HYDROcodone-homatropine (HYCODAN) 5-1.5 MG/5ML syrup    Sig: Take 5 mLs by mouth every 4 (four) hours as needed.    Dispense:  120 mL    Refill:  0    Order Specific Question:  Supervising Provider    Answer:  Mikey Kirschner [[id number]]   Influenza-the patient was diagnosed with influenza. Patient/family educated about the flu and warning signs to watch for. If difficulty breathing, severe neck pain and stiffness, cyanosis, disorientation, or progressive worsening then immediately get rechecked at that ER. If progressive symptoms be certain to be rechecked. Supportive measures such as Tylenol/ibuprofen was discussed. No aspirin use in children. And influenza home care instruction sheet was given.

## 2013-12-16 NOTE — Patient Instructions (Signed)

## 2013-12-17 ENCOUNTER — Encounter: Payer: Self-pay | Admitting: Family Medicine

## 2013-12-17 ENCOUNTER — Telehealth: Payer: Self-pay | Admitting: Family Medicine

## 2013-12-17 NOTE — Telephone Encounter (Signed)
Pt wanted to know if she could take anything for the aches. I told her she could take Tylenol or Ibu as directed. She verbalized understanding.

## 2013-12-17 NOTE — Telephone Encounter (Signed)
Patient diagnosed with the flu yesterday. She said she is having a hard time sleeping due to cough and would like to know if there is anything she can take to help her.    [facility] [place]

## 2013-12-18 ENCOUNTER — Telehealth: Payer: Self-pay | Admitting: Family Medicine

## 2013-12-18 NOTE — Telephone Encounter (Signed)
Patient would like work note extended.  First day out 12/16/13 to return to work 12/23/13, Dx with earlier this week, still feels horrible, please fax to [facility] [phone]

## 2013-12-19 ENCOUNTER — Encounter: Payer: Self-pay | Admitting: Family Medicine

## 2013-12-19 NOTE — Telephone Encounter (Signed)
Please give work excuse for influenza.

## 2013-12-20 ENCOUNTER — Other Ambulatory Visit: Payer: Self-pay | Admitting: Family Medicine

## 2013-12-20 NOTE — Telephone Encounter (Signed)
Ok plus 2 monthly ref 

## 2014-01-06 ENCOUNTER — Encounter: Payer: Self-pay | Admitting: Family Medicine

## 2014-01-06 ENCOUNTER — Ambulatory Visit (INDEPENDENT_AMBULATORY_CARE_PROVIDER_SITE_OTHER): Payer: BC Managed Care – PPO | Admitting: Family Medicine

## 2014-01-06 VITALS — BP 110/70 | Ht 61.0 in | Wt 151.4 lb

## 2014-01-06 DIAGNOSIS — A084 Viral intestinal infection, unspecified: Secondary | ICD-10-CM

## 2014-01-06 DIAGNOSIS — A088 Other specified intestinal infections: Secondary | ICD-10-CM

## 2014-01-06 MED ORDER — ONDANSETRON HCL 8 MG PO TABS: 8.0000 mg | ORAL_TABLET | Freq: Three times a day (TID) | ORAL | Status: DC | PRN

## 2014-01-06 NOTE — Progress Notes (Signed)
   Subjective:    Patient ID: Cristina Davenport, female    DOB: 07-24-1986, 28 y.o.   MRN: [id number]  Emesis  This is a new problem. The current episode started yesterday. Associated symptoms include abdominal pain, diarrhea (very loose) and a fever (101.8). Pertinent negatives include no chest pain or coughing. Associated symptoms comments: diarrhea.   Started last night Nausea Sat Chills/sweats/ stomach  Pain V at 1 amm this morning   Review of Systems  Constitutional: Positive for fever (101.8), appetite change (none) and fatigue.  HENT: Negative for congestion.   Respiratory: Negative for cough and choking.   Cardiovascular: Negative for chest pain.  Gastrointestinal: Positive for nausea, vomiting, abdominal pain and diarrhea (very loose). Negative for constipation and blood in stool.       Objective:   Physical Exam  Nursing note and vitals reviewed. Constitutional: She appears well-developed.  HENT:  Head: Normocephalic.  Nose: Nose normal.  Mouth/Throat: Oropharynx is clear and moist. No oropharyngeal exudate.  Neck: Neck supple.  Cardiovascular: Normal rate and normal heart sounds.   No murmur heard. Pulmonary/Chest: Effort normal and breath sounds normal. She has no wheezes.  Abdominal: Soft. She exhibits no distension. There is tenderness (slight throughout).  Lymphadenopathy:    She has no cervical adenopathy.  Skin: Skin is warm and dry.          Assessment & Plan:  Gastroenteritis viral supportive measures discussed followup if ongoing troubles

## 2014-01-10 ENCOUNTER — Emergency Department (HOSPITAL_COMMUNITY): Payer: BC Managed Care – PPO

## 2014-01-10 ENCOUNTER — Encounter (HOSPITAL_COMMUNITY): Payer: Self-pay | Admitting: Emergency Medicine

## 2014-01-10 ENCOUNTER — Emergency Department (HOSPITAL_COMMUNITY)
Admission: EM | Admit: 2014-01-10 | Discharge: 2014-01-10 | Disposition: A | Discharge status: Home / Self Care | Payer: BC Managed Care – PPO | Attending: Emergency Medicine | Admitting: Emergency Medicine

## 2014-01-10 DIAGNOSIS — S300XXA Contusion of lower back and pelvis, initial encounter: Secondary | ICD-10-CM

## 2014-01-10 DIAGNOSIS — R202 Paresthesia of skin: Secondary | ICD-10-CM

## 2014-01-10 DIAGNOSIS — Z79899 Other long term (current) drug therapy: Secondary | ICD-10-CM | POA: Insufficient documentation

## 2014-01-10 DIAGNOSIS — F319 Bipolar disorder, unspecified: Secondary | ICD-10-CM | POA: Insufficient documentation

## 2014-01-10 DIAGNOSIS — Z91040 Latex allergy status: Secondary | ICD-10-CM | POA: Insufficient documentation

## 2014-01-10 DIAGNOSIS — G43909 Migraine, unspecified, not intractable, without status migrainosus: Secondary | ICD-10-CM | POA: Insufficient documentation

## 2014-01-10 DIAGNOSIS — Y9389 Activity, other specified: Secondary | ICD-10-CM | POA: Insufficient documentation

## 2014-01-10 DIAGNOSIS — R000 Tachycardia, unspecified: Secondary | ICD-10-CM | POA: Insufficient documentation

## 2014-01-10 DIAGNOSIS — Z87891 Personal history of nicotine dependence: Secondary | ICD-10-CM | POA: Insufficient documentation

## 2014-01-10 DIAGNOSIS — S3981XA Other specified injuries of abdomen, initial encounter: Secondary | ICD-10-CM | POA: Insufficient documentation

## 2014-01-10 DIAGNOSIS — F411 Generalized anxiety disorder: Secondary | ICD-10-CM | POA: Insufficient documentation

## 2014-01-10 DIAGNOSIS — Y929 Unspecified place or not applicable: Secondary | ICD-10-CM | POA: Insufficient documentation

## 2014-01-10 DIAGNOSIS — Z3202 Encounter for pregnancy test, result negative: Secondary | ICD-10-CM | POA: Insufficient documentation

## 2014-01-10 DIAGNOSIS — S20229A Contusion of unspecified back wall of thorax, initial encounter: Secondary | ICD-10-CM | POA: Insufficient documentation

## 2014-01-10 DIAGNOSIS — Z8719 Personal history of other diseases of the digestive system: Secondary | ICD-10-CM | POA: Insufficient documentation

## 2014-01-10 DIAGNOSIS — R209 Unspecified disturbances of skin sensation: Secondary | ICD-10-CM | POA: Insufficient documentation

## 2014-01-10 DIAGNOSIS — S0990XA Unspecified injury of head, initial encounter: Secondary | ICD-10-CM | POA: Insufficient documentation

## 2014-01-10 DIAGNOSIS — W010XXA Fall on same level from slipping, tripping and stumbling without subsequent striking against object, initial encounter: Secondary | ICD-10-CM | POA: Insufficient documentation

## 2014-01-10 LAB — POC URINE PREG, ED: Preg Test, Ur: NEGATIVE

## 2014-01-10 IMAGING — CT CT L SPINE W/O CM
3 series · 15 of 33 positions shown, 18 images · non-contrast
Comparison: CT scan [DATE]

CLINICAL DATA: Fell.  Back pain.

EXAM:
CT LUMBAR SPINE WITHOUT CONTRAST
TECHNIQUE: Multidetector CT imaging of the lumbar spine was performed without
intravenous contrast administration. Multiplanar CT image
reconstructions were also generated.

[Series 3: lumbar spine 2.0 b30s · axial · 0.36mm/px · z∈[-382,-204]mm · 7 of 107 slices shown, 9 images]
[im 9/107  soft-tissue]
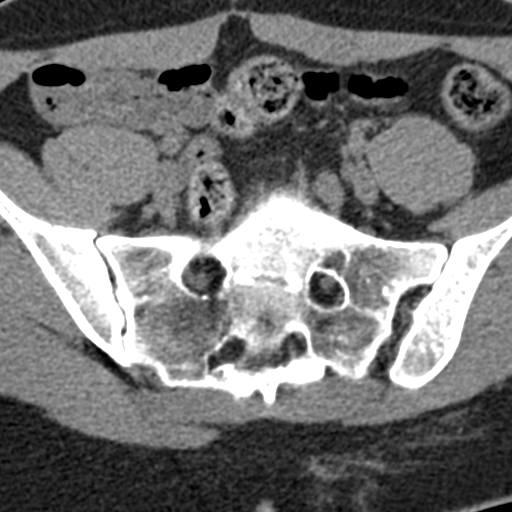
[im 9/107  bone]
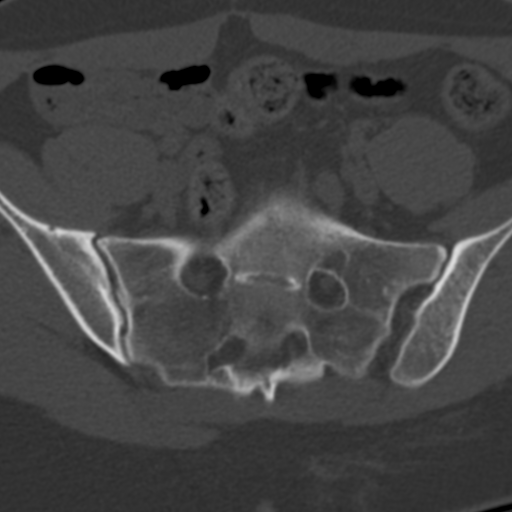
[im 25/107  bone]
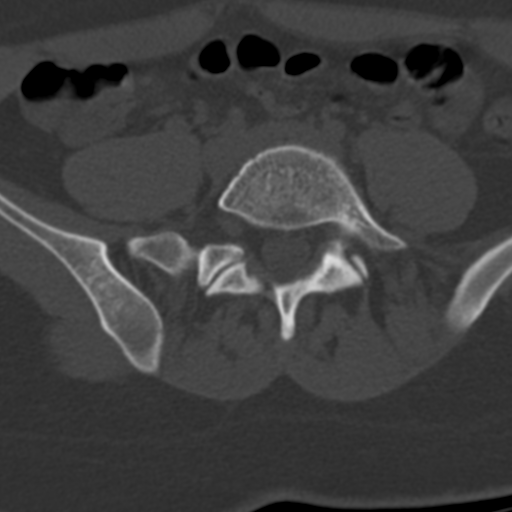
[im 41/107  bone]
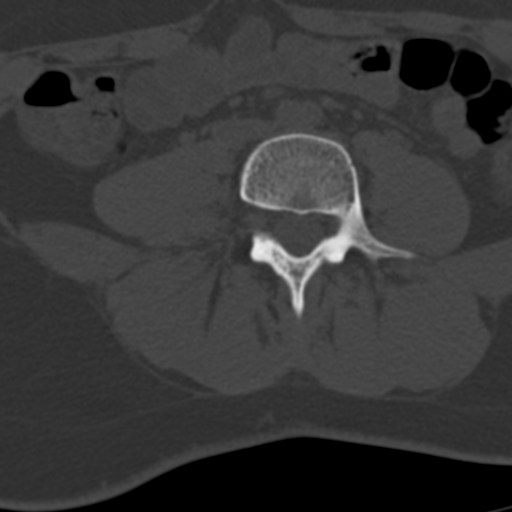
[im 58/107  bone]
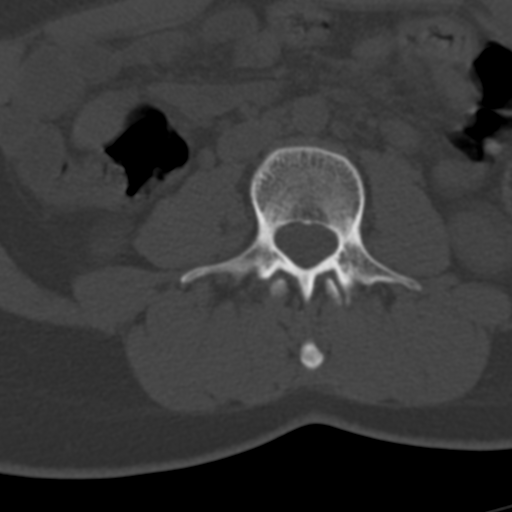
[im 66/107  soft-tissue]
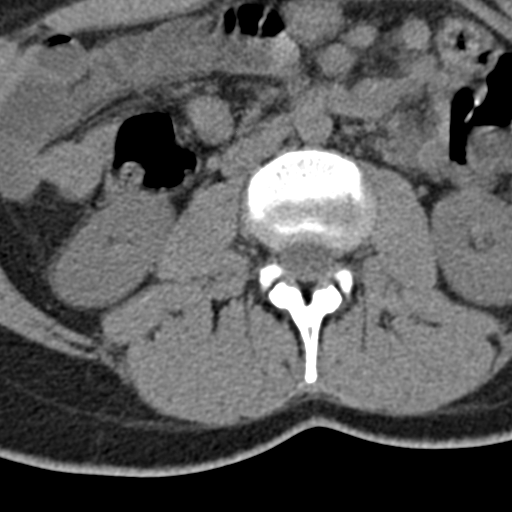
[im 66/107  bone]
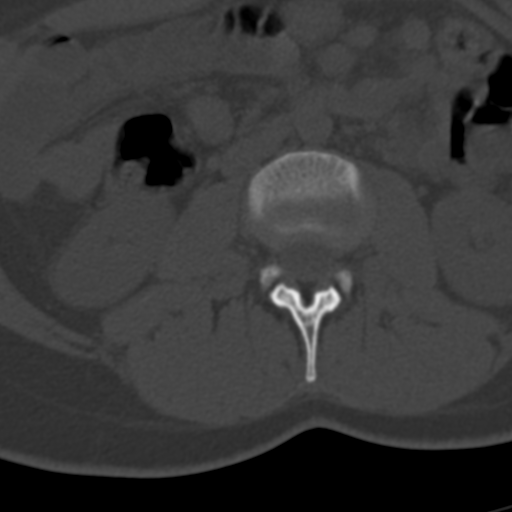
[im 82/107  bone]
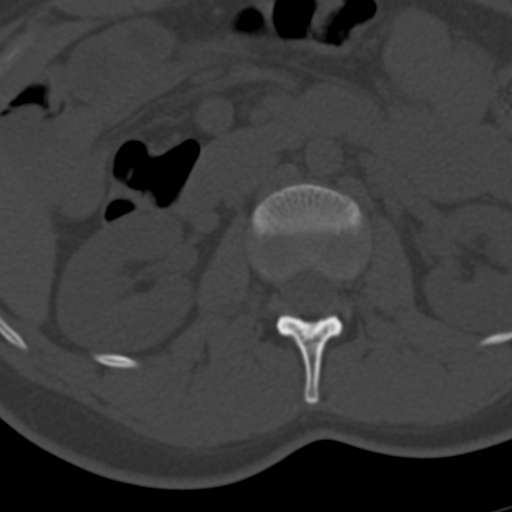
[im 98/107  bone]
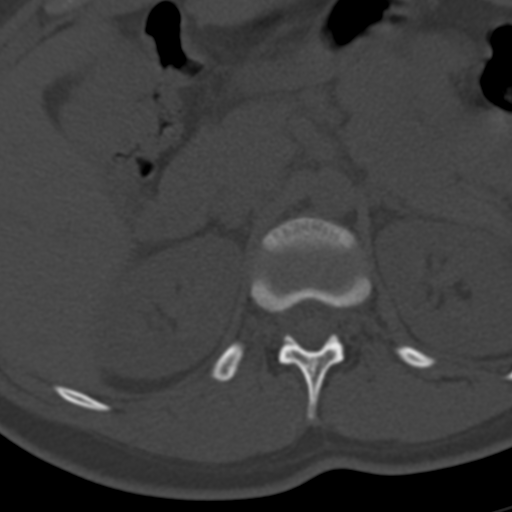

[Series 4: lumbar spine 2.0 spo cor · coronal · 0.39mm/px · 3 of 82 slices shown]
[im 17/82  bone]
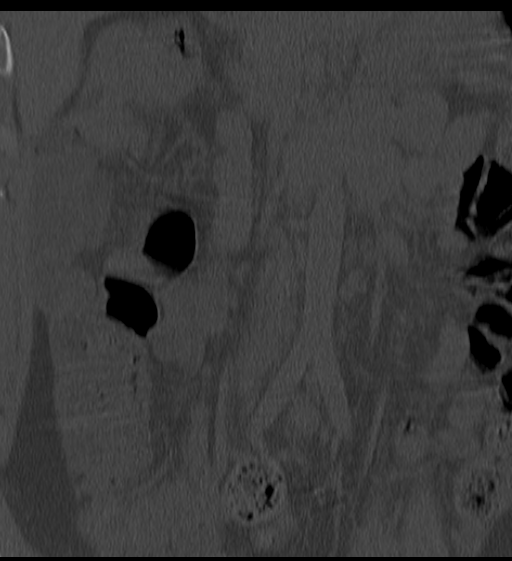
[im 33/82  bone]
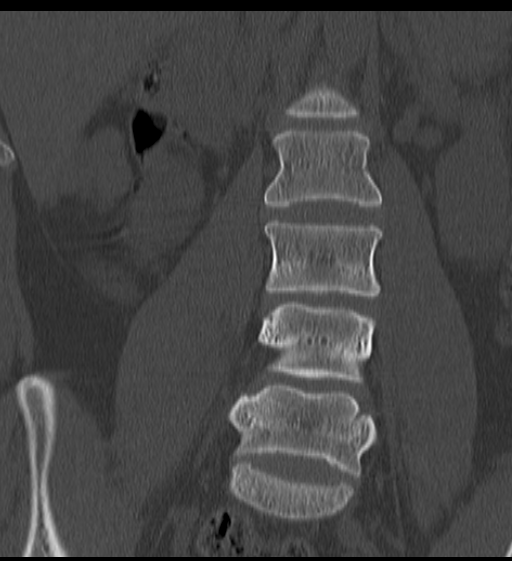
[im 49/82  bone]
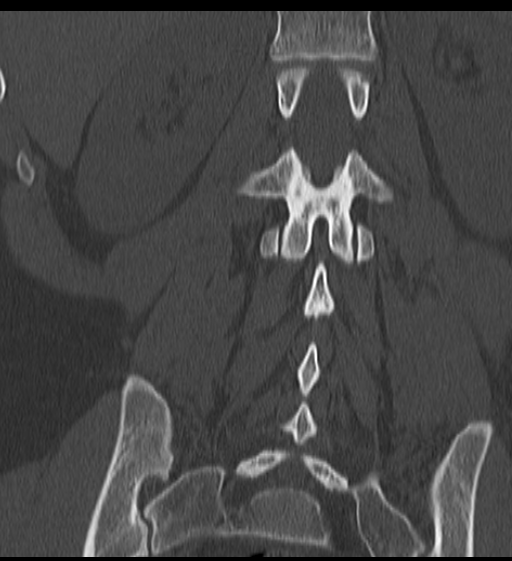

[Series 5: lumbar spine 2.0 spo sag · sagittal · 0.35mm/px · 5 of 93 slices shown, 6 images]
[im 31/93  bone]
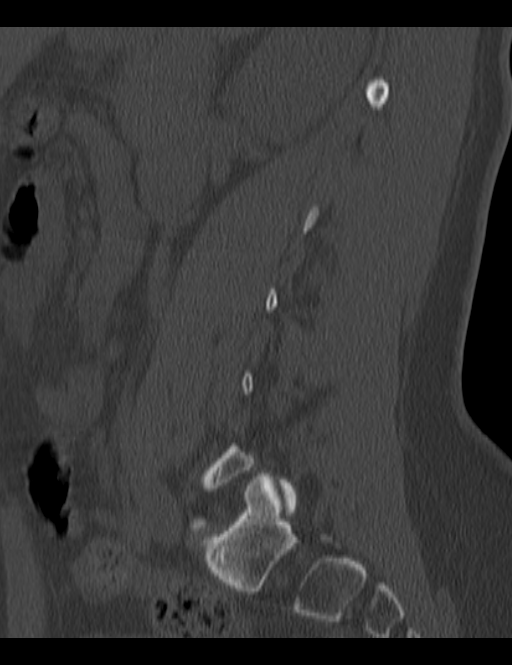
[im 39/93  bone]
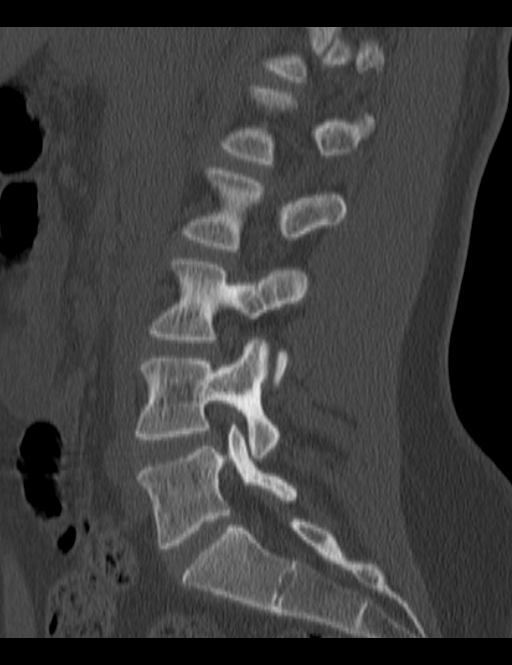
[im 47/93  soft-tissue]
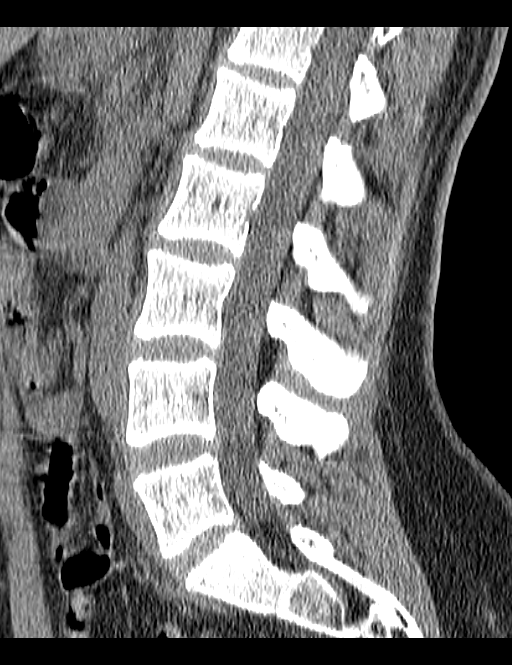
[im 47/93  bone]
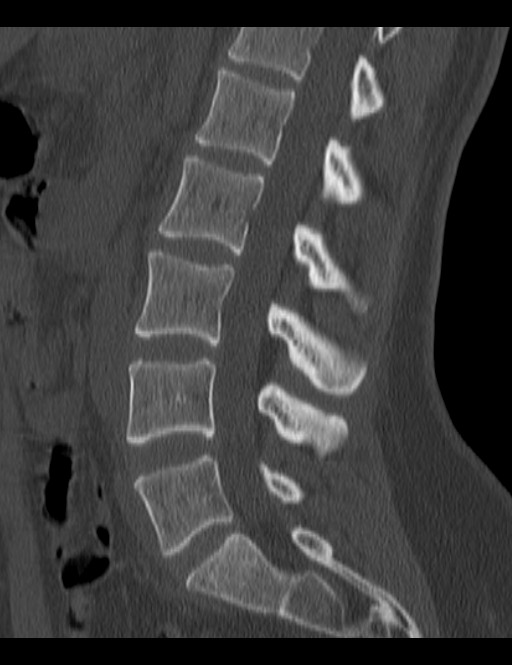
[im 54/93  bone]
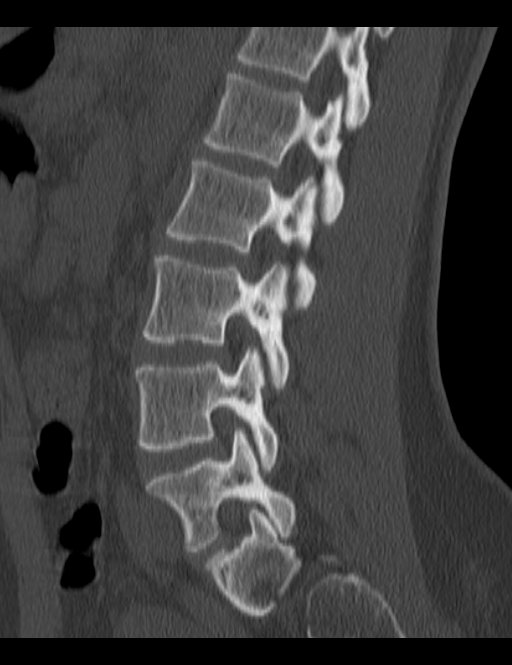
[im 62/93  bone]
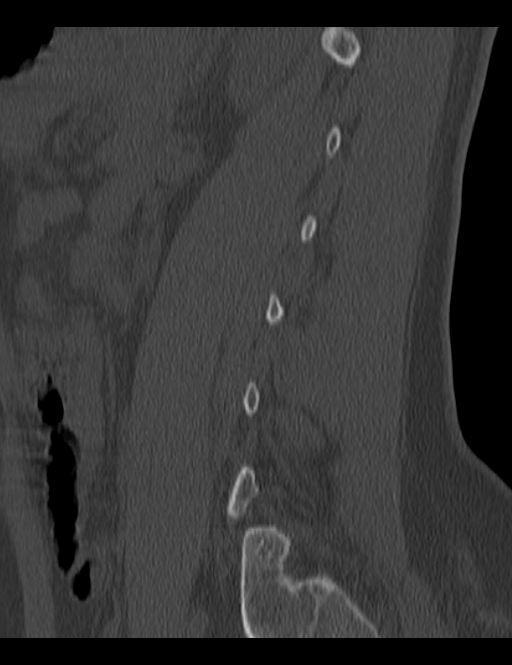

[15 of 33 positions shown; findings below may reference images not displayed]

FINDINGS: Normal alignment of the lumbar vertebral bodies. Disc spaces and
vertebral bodies are maintained no acute fracture. The facets are
normally aligned. No acute pedicle, pars or facet fractures. No pars
defects.

No significant paraspinal or retroperitoneal process. No disc
protrusions, spinal or foraminal stenosis. Mild left convex lumbar
scoliosis. The visualized lower ribs are intact. No transverse
process fractures. The visualized bony pelvis is intact.
IMPRESSION: Normal alignment and no acute bony findings.

## 2014-01-10 IMAGING — CT CT L SPINE W/O CM
4 series · 14 of 33 positions shown, 17 images · non-contrast
Comparison: CT scan [DATE]

CLINICAL DATA: Fell.  Back pain.

EXAM:
CT LUMBAR SPINE WITHOUT CONTRAST
TECHNIQUE: Multidetector CT imaging of the lumbar spine was performed without
intravenous contrast administration. Multiplanar CT image
reconstructions were also generated.

[Series 3: cervical 2.0 st axial · axial · 0.33mm/px · z∈[+44,+144]mm · 5 of 76 slices shown, 7 images]
[im 13/76  soft-tissue]
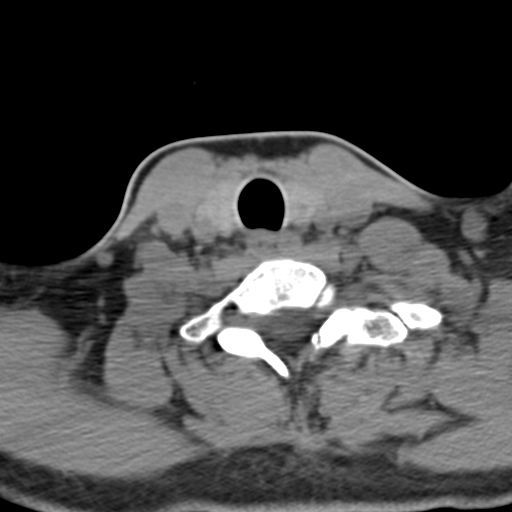
[im 13/76  bone]
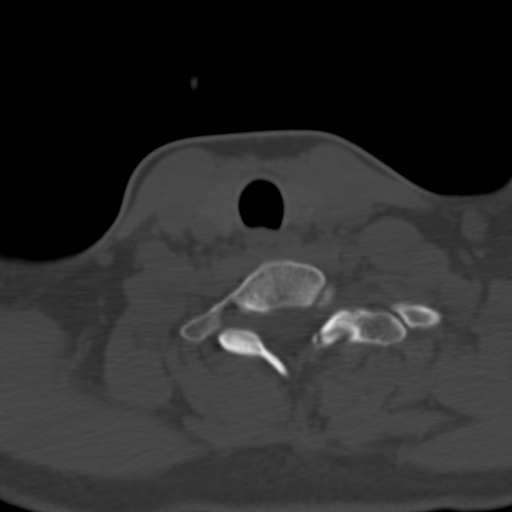
[im 26/76  bone]
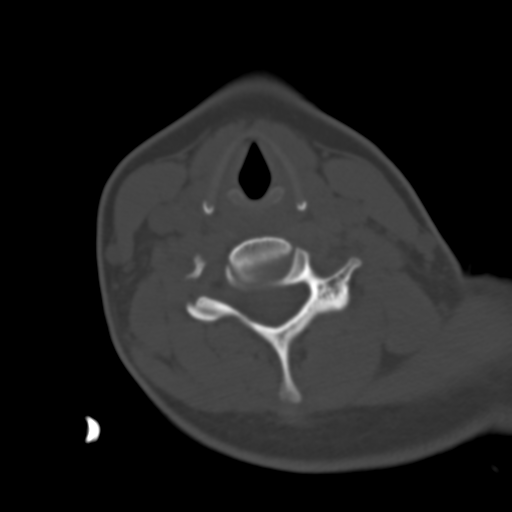
[im 38/76  bone]
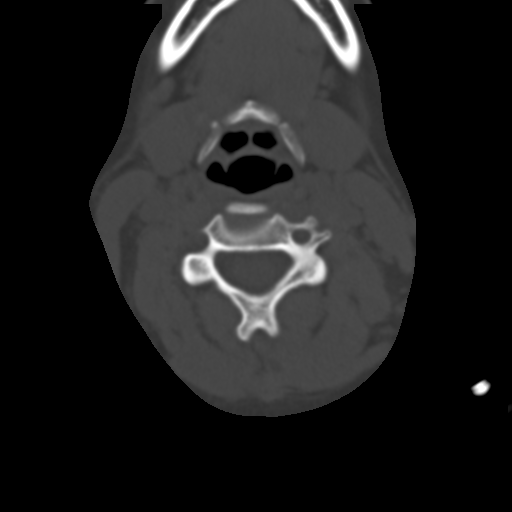
[im 51/76  bone]
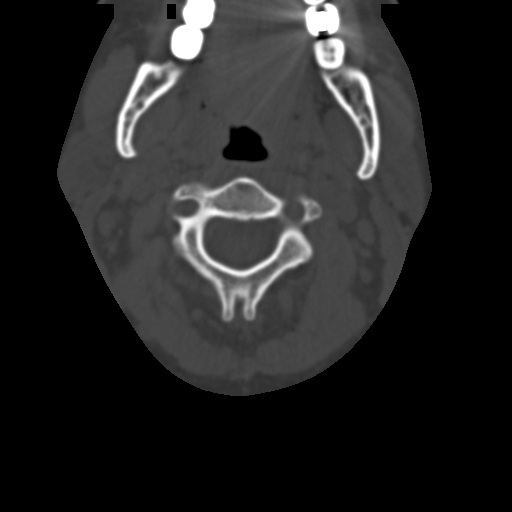
[im 63/76  soft-tissue]
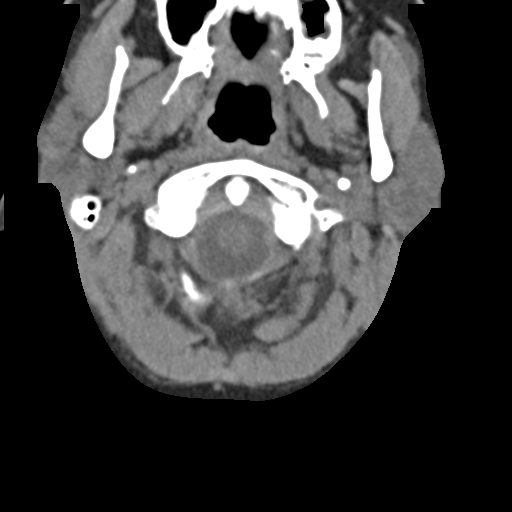
[im 63/76  bone]
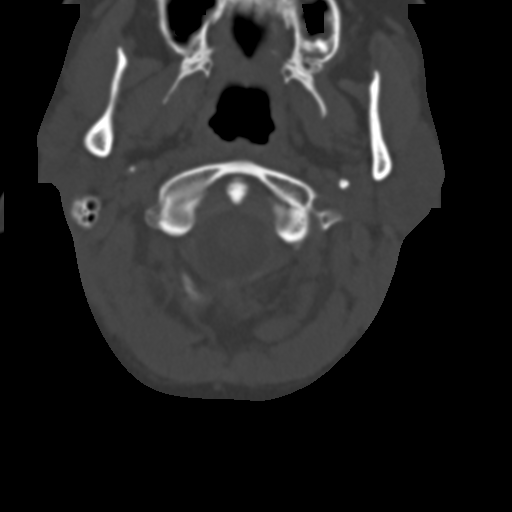

[Series 5: cervical spine sagittal bone · sagittal · 0.37mm/px · 5 of 57 slices shown, 6 images]
[im 19/57  bone]
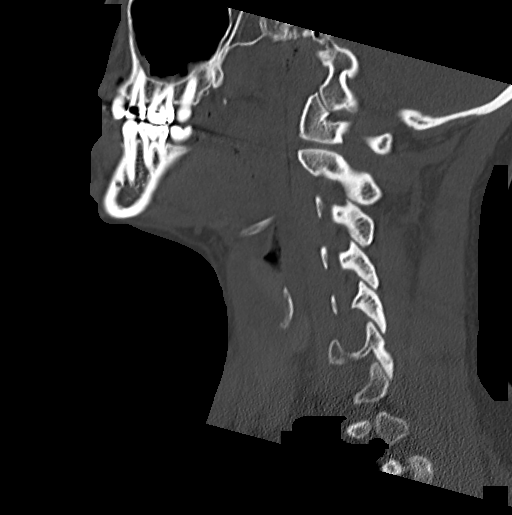
[im 24/57  bone]
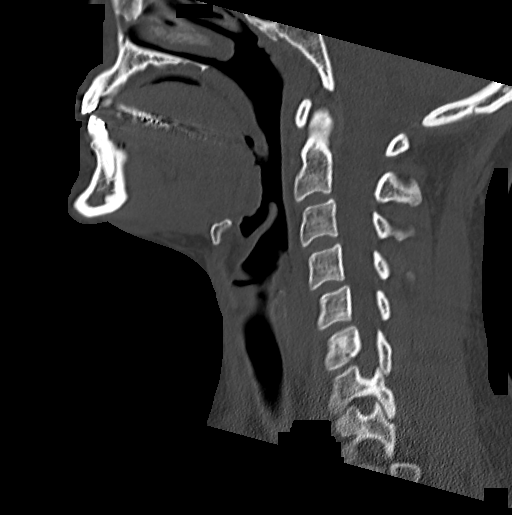
[im 29/57  soft-tissue]
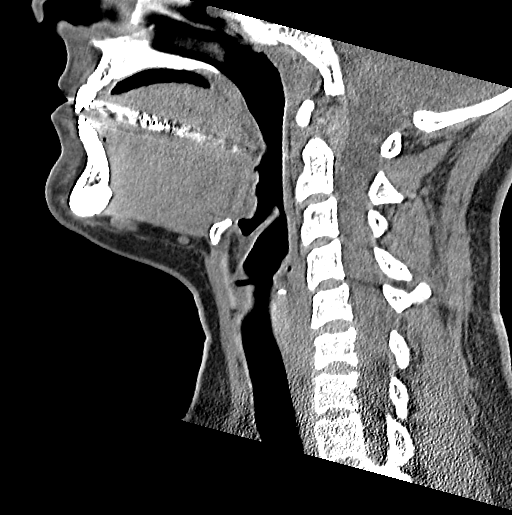
[im 29/57  bone]
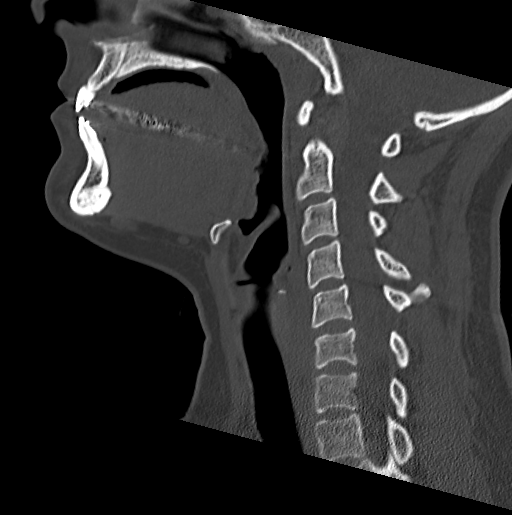
[im 33/57  bone]
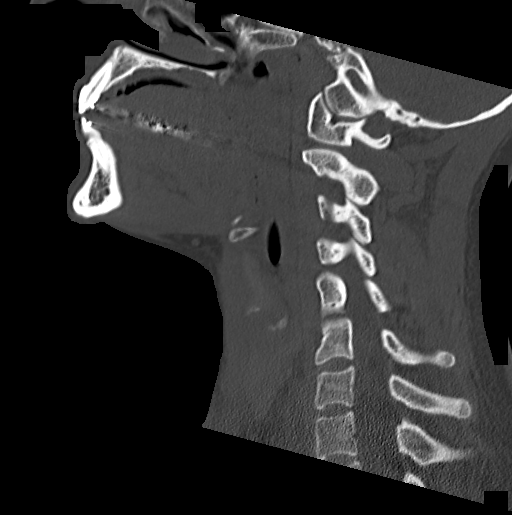
[im 38/57  bone]
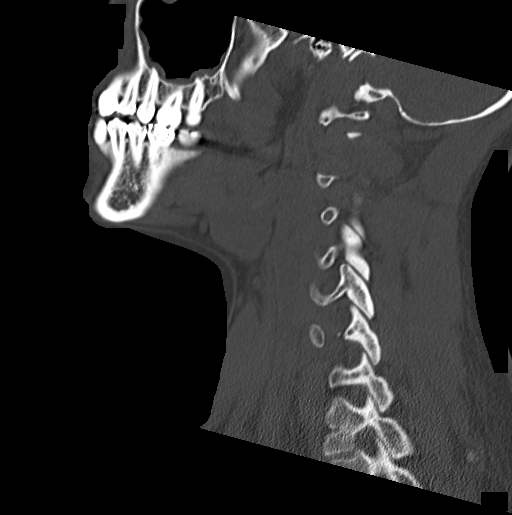

[Series 6: cervical spine coronal bone · coronal · 0.21mm/px · 3 of 55 slices shown]
[im 11/55  bone]
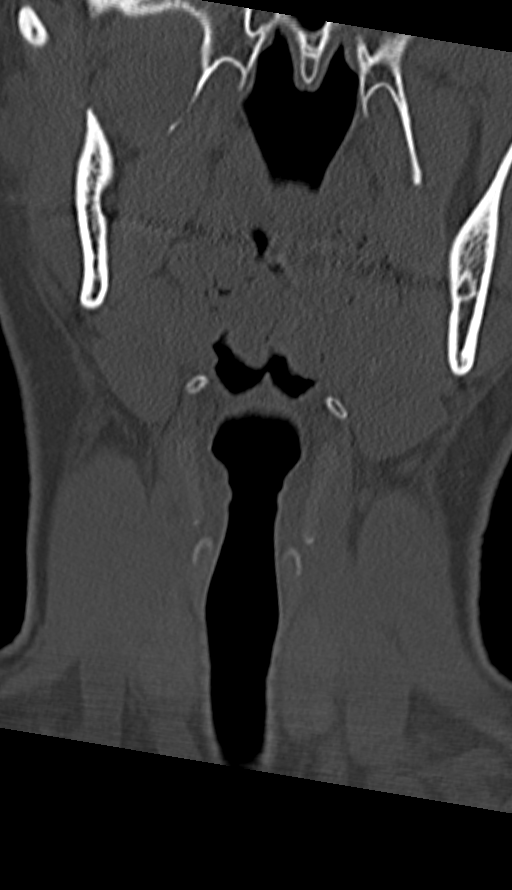
[im 22/55  bone]
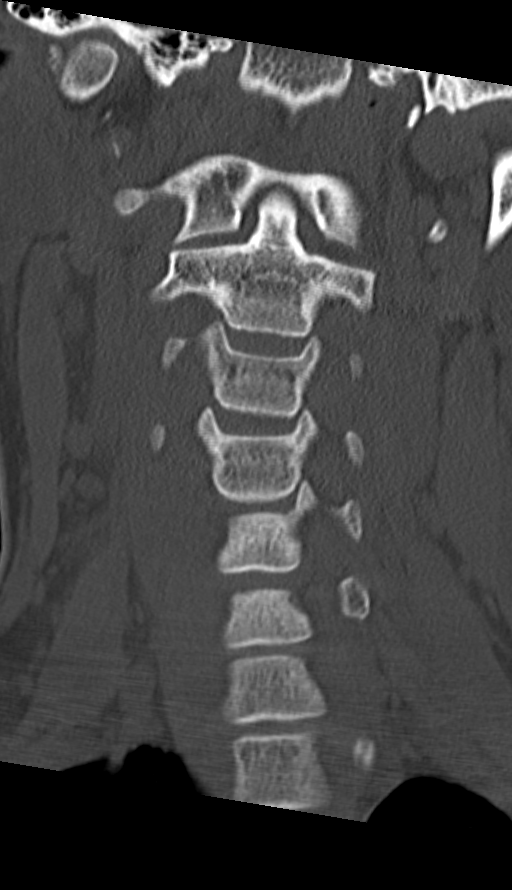
[im 33/55  bone]
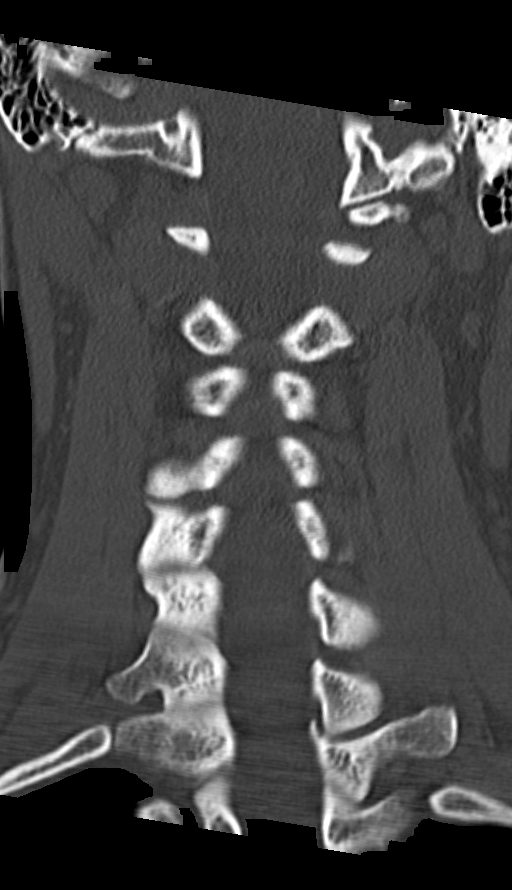

[Series 7: cervical spine axial bone · axial · 0.17mm/px · 1 of 79 slices shown]
[im 14/79  bone]
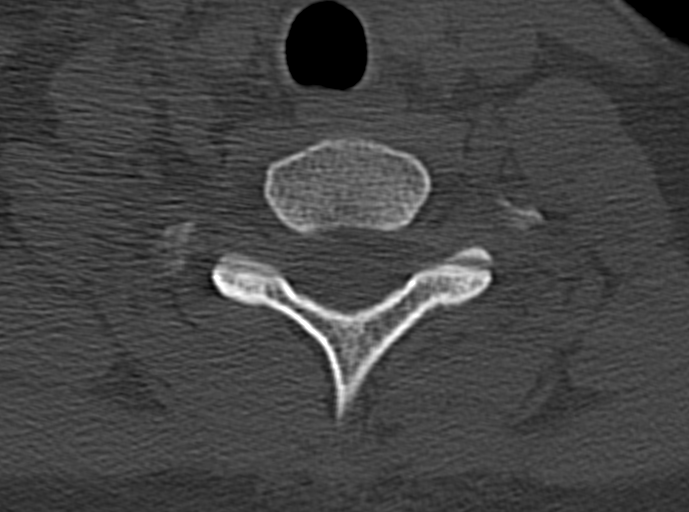

[14 of 33 positions shown; findings below may reference images not displayed]

FINDINGS: Normal alignment of the lumbar vertebral bodies. Disc spaces and
vertebral bodies are maintained no acute fracture. The facets are
normally aligned. No acute pedicle, pars or facet fractures. No pars
defects.

No significant paraspinal or retroperitoneal process. No disc
protrusions, spinal or foraminal stenosis. Mild left convex lumbar
scoliosis. The visualized lower ribs are intact. No transverse
process fractures. The visualized bony pelvis is intact.
IMPRESSION: Normal alignment and no acute bony findings.

## 2014-01-10 MED ORDER — HYDROCODONE-ACETAMINOPHEN 5-325 MG PO TABS
1.0000 | ORAL_TABLET | ORAL | Status: DC | PRN
Start: 2014-01-10 — End: 2014-01-21

## 2014-01-10 MED ORDER — HYDROCODONE-ACETAMINOPHEN 5-325 MG PO TABS
2.0000 | ORAL_TABLET | Freq: Once | ORAL | Status: AC
  Administered 2014-01-10: 2 via ORAL
  Filled 2014-01-10: qty 2

## 2014-01-10 MED ORDER — ONDANSETRON 4 MG PO TBDP
4.0000 mg | ORAL_TABLET | Freq: Once | ORAL | Status: AC
  Administered 2014-01-10: 4 mg via ORAL
  Filled 2014-01-10: qty 1

## 2014-01-10 MED ORDER — METHOCARBAMOL 500 MG PO TABS
1000.0000 mg | ORAL_TABLET | Freq: Once | ORAL | Status: AC
  Administered 2014-01-10: 1000 mg via ORAL
  Filled 2014-01-10: qty 2

## 2014-01-10 MED ORDER — METHOCARBAMOL 500 MG PO TABS: 500.0000 mg | ORAL_TABLET | Freq: Three times a day (TID) | ORAL | Status: DC

## 2014-01-10 MED ORDER — KETOROLAC TROMETHAMINE 10 MG PO TABS
10.0000 mg | ORAL_TABLET | Freq: Once | ORAL | Status: AC
  Administered 2014-01-10: 10 mg via ORAL
  Filled 2014-01-10: qty 1

## 2014-01-10 MED ORDER — KETOROLAC TROMETHAMINE 10 MG PO TABS: 10.0000 mg | ORAL_TABLET | Freq: Three times a day (TID) | ORAL | Status: DC

## 2014-01-10 NOTE — Discharge Instructions (Signed)
Your CT scan of the cervical spine and lumbar spine are negative for fracture, or subluxation. Please use the crutches until you're able to safely apply weight to your left lower extremity. Please see the orthopedic specialist at [facility] orthopedist if pain is not improving, or if the numbness and tingling continues. Please return to the emergency department immediately if any loss of use of the lower extremity, or loss of bowel or bladder function, or deterioration in her general condition.

## 2014-01-10 NOTE — ED Notes (Signed)
Pt slipped on ice this morning and fell. Pain to left lower back, numbness to left leg. Tearful at triage.

## 2014-01-10 NOTE — ED Notes (Signed)
Pt alert & oriented x4, stable gait. Patient given discharge instructions, paperwork & prescription(s). Patient  instructed to stop at the registration desk to finish any additional paperwork. Patient verbalized understanding. Pt left department w/ no further questions. 

## 2014-01-10 NOTE — ED Notes (Signed)
Pt unable to provide urine specimen at this time for preg. For CT scan. Assisted pt with bed pan but unsuccessful. Pt unable to bear weight to go to restroom. Pt will notify staff when she can attempt to provide urine specimen.

## 2014-01-10 NOTE — ED Notes (Signed)
PA currently at bedside. 

## 2014-01-10 NOTE — ED Provider Notes (Signed)
CSN: [id number]     Arrival date & time 01/10/14  0840 History   First MD Initiated Contact with Patient 01/10/14 0848     Chief Complaint  Patient presents with  . Fall     (Consider location/radiation/quality/duration/timing/severity/associated sxs/prior Treatment) HPI Comments: Patient is a 28 year old female who presents to the emergency department with a complaint of back pain, numbness of the left leg. The patient states that this morning, probably 45 minutes prior to her arrival in the emergency department, she fell on some ice and landed primarily on her back. She states she now has severe pain of her lower back. And states that since the fall she has had numbness and difficulty using her left leg. She has not had any previous operations or procedures involving her back or her leg. She denies any use of anticoagulation medications, and she has no history of any bleeding disorders. There's been no loss of bowel or bladder function reported. The patient has not taken any medication for this problem up to this point.  Patient is a 28 y.o. female presenting with fall. The history is provided by the patient.  Fall This is a new problem. The current episode started today. The problem occurs constantly. The problem has been gradually worsening. Associated symptoms include abdominal pain and headaches. Pertinent negatives include no arthralgias, chest pain, coughing or neck pain.    Past Medical History  Diagnosis Date  . Anxiety   . Bipolar 1 disorder   . Anxiety   . GERD (gastroesophageal reflux disease)   . IBS (irritable bowel syndrome)   . Migraine without aura    Past Surgical History  Procedure Laterality Date  . Examination under anesthesia  04/21/2012    Gluteal wound repair  . Esophagogastroduodenoscopy N/A 04/30/2013    BMW:[id number] distal esophagus of uncertain clinical significance-status post biopsy. Tiny hiatal hernia. No explanation  . Cholecystectomy N/A 07/10/2013     Procedure: LAPAROSCOPIC CHOLECYSTECTOMY;  Surgeon: Donato Heinz, MD;  Location: [facility];  Service: General;  Laterality: N/A;  . Abdominal surgery      cyst removed    Family History  Problem Relation Age of Onset  . Hypertension Maternal Grandmother   . Stroke Maternal Grandmother   . Hypertension Maternal Grandfather   . Stroke Maternal Grandfather   . Crohn's disease Maternal Aunt   . Crohn's disease Cousin   . Colon cancer Neg Hx    History  Substance Use Topics  . Smoking status: Former Smoker -- 0.25 packs/day for 8 years    Types: Cigarettes  . Smokeless tobacco: Not on file     Comment: in process of quitting  . Alcohol Use: Yes     Comment: occasionally,  social   OB History   Grav Para Term Preterm Abortions TAB SAB Ect Mult Living                 Review of Systems  Constitutional: Negative for activity change.       All ROS Neg except as noted in HPI  HENT: Negative for nosebleeds.   Eyes: Negative for photophobia and discharge.  Respiratory: Negative for cough, shortness of breath and wheezing.   Cardiovascular: Negative for chest pain and palpitations.  Gastrointestinal: Positive for abdominal pain. Negative for blood in stool.  Genitourinary: Negative for dysuria, frequency and hematuria.  Musculoskeletal: Positive for back pain. Negative for arthralgias and neck pain.  Skin: Negative.   Neurological: Positive for headaches. Negative for dizziness,  seizures and speech difficulty.  Psychiatric/Behavioral: Negative for hallucinations and confusion. The patient is nervous/anxious.       Allergies  Doxycycline and Latex  Home Medications   Current Outpatient Rx  Name  Route  Sig  Dispense  Refill  . clonazePAM (KLONOPIN) 0.5 MG tablet      TAKE ONE TABLET BY MOUTH TWICE DAILY AS NEEDED FOR ANXIETY   60 tablet   2   . escitalopram (LEXAPRO) 20 MG tablet      TAKE ONE TABLET BY MOUTH ONCE DAILY   30 tablet   0   . ibuprofen (ADVIL,MOTRIN) 200  MG tablet   Oral   Take 400 mg by mouth every 6 (six) hours as needed for pain.         Marland Kitchen lamoTRIgine (LAMICTAL) 100 MG tablet      TAKE ONE TABLET BY MOUTH ONCE DAILY   30 tablet   0   . levonorgestrel (MIRENA) 20 MCG/24HR IUD   Intrauterine   1 each by Intrauterine route once.          BP 121/75  Pulse 103  Temp(Src) 98.9 F (37.2 C) (Oral)  Resp 16  Ht 5\' 1"  (1.549 m)  Wt 150 lb (68.04 kg)  BMI 28.36 kg/m2  SpO2 98% Physical Exam  Nursing note and vitals reviewed. Constitutional: She is oriented to person, place, and time. She appears well-developed and well-nourished.  Non-toxic appearance.  Upon my arrival to the room, the patient is lying on her right side, communicating with gas in her room, and take sting on her phone. Appears to be in no distress upon arrival to the room.  HENT:  Head: Normocephalic.  Right Ear: Tympanic membrane and external ear normal.  Left Ear: Tympanic membrane and external ear normal.  Eyes: EOM and lids are normal. Pupils are equal, round, and reactive to light.  Neck: Normal range of motion. Neck supple. Carotid bruit is not present.  Cardiovascular: Regular rhythm, normal heart sounds, intact distal pulses and normal pulses.  Tachycardia present.   Pulmonary/Chest: Breath sounds normal. No respiratory distress.  Abdominal: Soft. Bowel sounds are normal. There is no tenderness. There is no guarding.  Musculoskeletal: Normal range of motion.   There is soreness to palpation of the paraspinal area of the cervical region. There is no palpable step off of the cervical spine. There is no palpable step off of the thoracic spine. There is no palpable step off of the lumbar spine. There is left greater than right paraspinal area tenderness of the lumbar region. There is tenderness near the coccyx region.  There is full range of motion of the upper extremities. There is full range of motion of the right lower extremity.    Lymphadenopathy:        Head (right side): No submandibular adenopathy present.       Head (left side): No submandibular adenopathy present.    She has no cervical adenopathy.  Neurological: She is alert and oriented to person, place, and time. She has normal strength. No cranial nerve deficit or sensory deficit.  The patient states she has a decreased sensation of touch examination of the left lower extremity. She states that she cannot flex or extend the lower leg. She cannot flex or extend the toes of the left lower extremity.  Skin: Skin is warm and dry.  Psychiatric: She has a normal mood and affect. Her speech is normal.    ED Course  Procedures (including  critical care time) Labs Review Labs Reviewed  POC URINE PREG, ED   Imaging Review No results found.  EKG Interpretation  None  MDM Patient ambulated to the bathroom with minimal assistance after pain meds. She is limping on the left lower extremity. Patient was able to urinate with control and without problem.  Urine pregnancy test is negative. CT of the cervical spine is negative for acute bony changes. CT of the lumbar spine shows normal alignment and no acute bony findings.  Recheck reveals no acute neurologic deficit. The patient does have pain with movement of the left lower extremity. The plan at this time is for the patient to apply ice to the lower back area. She works with the [employer]. She states that she will follow with one of the clinicians at this facility. The patient was fitted with crutches to assist with taking pressure off of the left extremity. Prescriptions for hydrocodone and Robaxin given to the patient. Patient advised to return to the emergency department if any changes or problems before her followup with orthopedics. Patient is in agreement with this discharge plan.    Final diagnoses:  None    **I have reviewed nursing notes, vital signs, and all appropriate lab and imaging results for this  patient.Lenox Ahr, PA-C 01/11/14 [phone]

## 2014-01-11 NOTE — ED Provider Notes (Signed)
Medical screening examination/treatment/procedure(s) were performed by non-physician practitioner and as supervising physician I was immediately available for consultation/collaboration.   EKG Interpretation None        Hoy Morn, MD 01/11/14 [phone]

## 2014-01-21 ENCOUNTER — Telehealth: Payer: Self-pay | Admitting: Family Medicine

## 2014-01-21 ENCOUNTER — Encounter: Payer: Self-pay | Admitting: Family Medicine

## 2014-01-21 ENCOUNTER — Ambulatory Visit (INDEPENDENT_AMBULATORY_CARE_PROVIDER_SITE_OTHER): Payer: BC Managed Care – PPO | Admitting: Family Medicine

## 2014-01-21 VITALS — BP 110/64 | Ht 61.0 in | Wt 149.0 lb

## 2014-01-21 DIAGNOSIS — S300XXA Contusion of lower back and pelvis, initial encounter: Secondary | ICD-10-CM

## 2014-01-21 MED ORDER — HYDROCODONE-ACETAMINOPHEN 5-325 MG PO TABS: 1.0000 | ORAL_TABLET | ORAL | Status: DC | PRN

## 2014-01-21 NOTE — Telephone Encounter (Signed)
Discussed with patient she will need office visit before meds can be prescribed. Transferred to the front to make appt for today.

## 2014-01-21 NOTE — Telephone Encounter (Signed)
Pt fell a week or so again, she has progressed to further pain an discomfort in the past 3 days, unable to sit at this point. Was seen in the ER on the 2/27 for this an given a CT scan  She states that it is noted up over the last few days, feels as though it is moving up her back  She has taken all her pain meds, muscle relaxer's, anti inflammatories   Wants to know what else she can do to help with this pain and discomfort?   [employer]

## 2014-01-21 NOTE — Progress Notes (Signed)
   Subjective:    Patient ID: Cristina Davenport, female    DOB: 09-Apr-1986, 28 y.o.   MRN: [id number]  Back Pain This is a new problem. Episode onset: 2/27. The problem occurs constantly. The pain is present in the gluteal and sacro-iliac. Radiates to: Upper back. The symptoms are aggravated by sitting and lying down. Risk factors include recent trauma. She has tried muscle relaxant and analgesics for the symptoms. The treatment provided mild relief.   Pt is here today d/t lower back and left hip pain.   She fell and went to the ED on 2/27.  She is still in pain and it is starting to radiate up her back.  States pain is radiating into the lower back and into the but I.   Review of Systems  Musculoskeletal: Positive for back pain.   patient denies any radiation down the leg. No abdominal symptoms.     Objective:   Physical Exam  Lungs clear heart regular flanks nontender abdomen soft nurse was present for the lower back/but I exam lower back nontender left but I has a large hematoma under the surface.      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

## 2014-01-31 ENCOUNTER — Other Ambulatory Visit: Payer: Self-pay | Admitting: Family Medicine

## 2014-02-19 ENCOUNTER — Other Ambulatory Visit: Payer: Self-pay | Admitting: Family Medicine

## 2014-03-11 ENCOUNTER — Telehealth: Payer: Self-pay | Admitting: Family Medicine

## 2014-03-11 NOTE — Telephone Encounter (Signed)
She may have 30 day prescription with 2 refills on each. She would need to followup though if ongoing medication needs.

## 2014-03-11 NOTE — Telephone Encounter (Addendum)
Patient says she lost her job and her insurance will run out on 03/13/14. She is requesting that we send in a couple of refills of her clonazePAM (KLONOPIN) 0.5 MG tablet, lamoTRIgine (LAMICTAL) 100 MG tablet, and escitalopram (LEXAPRO) 20 MG tablet until she can find another job and get insurance. [facility]

## 2014-03-12 MED ORDER — ESCITALOPRAM OXALATE 20 MG PO TABS: ORAL_TABLET | ORAL | Status: DC

## 2014-03-12 MED ORDER — LAMOTRIGINE 100 MG PO TABS: ORAL_TABLET | ORAL | Status: DC

## 2014-03-12 MED ORDER — CLONAZEPAM 0.5 MG PO TABS: ORAL_TABLET | ORAL | Status: DC

## 2014-03-12 NOTE — Telephone Encounter (Signed)
Rx's sent to pharmacy. Patient notified. 

## 2014-09-09 ENCOUNTER — Encounter: Payer: Self-pay | Admitting: Nurse Practitioner

## 2014-09-09 ENCOUNTER — Ambulatory Visit (INDEPENDENT_AMBULATORY_CARE_PROVIDER_SITE_OTHER): Payer: BC Managed Care – PPO | Admitting: Nurse Practitioner

## 2014-09-09 VITALS — BP 130/90 | Ht 61.0 in | Wt 141.0 lb

## 2014-09-09 DIAGNOSIS — F4322 Adjustment disorder with anxiety: Secondary | ICD-10-CM

## 2014-09-09 DIAGNOSIS — Z01419 Encounter for gynecological examination (general) (routine) without abnormal findings: Secondary | ICD-10-CM

## 2014-09-09 DIAGNOSIS — Z0000 Encounter for general adult medical examination without abnormal findings: Secondary | ICD-10-CM

## 2014-09-09 DIAGNOSIS — Z124 Encounter for screening for malignant neoplasm of cervix: Secondary | ICD-10-CM

## 2014-09-09 MED ORDER — ESCITALOPRAM OXALATE 20 MG PO TABS: ORAL_TABLET | ORAL | Status: DC

## 2014-09-09 MED ORDER — CLONAZEPAM 0.5 MG PO TABS
ORAL_TABLET | ORAL | Status: DC
Start: 2014-09-09 — End: 2015-02-09

## 2014-09-10 LAB — PAP IG W/ RFLX HPV ASCU

## 2014-09-11 ENCOUNTER — Ambulatory Visit (INDEPENDENT_AMBULATORY_CARE_PROVIDER_SITE_OTHER): Payer: BC Managed Care – PPO | Admitting: Nurse Practitioner

## 2014-09-11 ENCOUNTER — Encounter: Payer: Self-pay | Admitting: Nurse Practitioner

## 2014-09-11 ENCOUNTER — Encounter: Payer: Self-pay | Admitting: Family Medicine

## 2014-09-11 VITALS — BP 122/88 | Ht 61.0 in | Wt 141.0 lb

## 2014-09-11 DIAGNOSIS — R87612 Low grade squamous intraepithelial lesion on cytologic smear of cervix (LGSIL): Secondary | ICD-10-CM

## 2014-09-11 NOTE — Patient Instructions (Signed)
Human Papillomavirus Quadrivalent Vaccine suspension for injection What is this medicine? HUMAN PAPILLOMAVIRUS VACCINE (HYOO muhn pap uh LOH muh vahy ruhs vak SEEN) is a vaccine. It is used to prevent infections of four types of the human papillomavirus. In women, the vaccine may lower your risk of getting cervical, vaginal, vulvar, or anal cancer and genital warts. In men, the vaccine may lower your risk of getting genital warts and anal cancer. You cannot get these diseases from the vaccine. This vaccine does not treat these diseases. This medicine may be used for other purposes; ask your health care provider or pharmacist if you have questions. COMMON BRAND NAME(S): Gardasil What should I tell my health care provider before I take this medicine? They need to know if you have any of these conditions: -fever or infection -hemophilia -HIV infection or AIDS -immune system problems -low platelet count -an unusual reaction to Human Papillomavirus Vaccine, yeast, other medicines, foods, dyes, or preservatives -pregnant or trying to get pregnant -breast-feeding How should I use this medicine? This vaccine is for injection in a muscle on your upper arm or thigh. It is given by a health care professional. Dennis Bast will be observed for 15 minutes after each dose. Sometimes, fainting happens after the vaccine is given. You may be asked to sit or lie down during the 15 minutes. Three doses are given. The second dose is given 2 months after the first dose. The last dose is given 4 months after the second dose. A copy of a Vaccine Information Statement will be given before each vaccination. Read this sheet carefully each time. The sheet may change frequently. Talk to your pediatrician regarding the use of this medicine in children. While this drug may be prescribed for children as young as 11 years of age for selected conditions, precautions do apply. Overdosage: If you think you have taken too much of this  medicine contact a poison control center or emergency room at once. NOTE: This medicine is only for you. Do not share this medicine with others. What if I miss a dose? All 3 doses of the vaccine should be given within 6 months. Remember to keep appointments for follow-up doses. Your health care provider will tell you when to return for the next vaccine. Ask your health care professional for advice if you are unable to keep an appointment or miss a scheduled dose. What may interact with this medicine? -other vaccines This list may not describe all possible interactions. Give your health care provider a list of all the medicines, herbs, non-prescription drugs, or dietary supplements you use. Also tell them if you smoke, drink alcohol, or use illegal drugs. Some items may interact with your medicine. What should I watch for while using this medicine? This vaccine may not fully protect everyone. Continue to have regular pelvic exams and cervical or anal cancer screenings as directed by your doctor. The Human Papillomavirus is a sexually transmitted disease. It can be passed by any kind of sexual activity that involves genital contact. The vaccine works best when given before you have any contact with the virus. Many people who have the virus do not have any signs or symptoms. Tell your doctor or health care professional if you have any reaction or unusual symptom after getting the vaccine. What side effects may I notice from receiving this medicine? Side effects that you should report to your doctor or health care professional as soon as possible: -allergic reactions like skin rash, itching or hives, swelling  of the face, lips, or tongue -breathing problems -feeling faint or lightheaded, falls Side effects that usually do not require medical attention (report to your doctor or health care professional if they continue or are bothersome): -cough -dizziness -fever -headache -nausea -redness, warmth,  swelling, pain, or itching at site where injected This list may not describe all possible side effects. Call your doctor for medical advice about side effects. You may report side effects to FDA at 1-800-FDA-1088. Where should I keep my medicine? This drug is given in a hospital or clinic and will not be stored at home. NOTE: This sheet is a summary. It may not cover all possible information. If you have questions about this medicine, talk to your doctor, pharmacist, or health care provider.  2015, Elsevier/Gold Standard. (2013-12-23 13:14:33) Human Papillomavirus Human papillomavirus (HPV) is the most common sexually transmitted infection (STI) and is highly contagious. HPV infections cause genital warts and cancers to the outlet of the womb (cervix), birth canal (vagina), opening of the birth canal (vulva), and anus. There are over 100 types of HPV. Four types of HPV are responsible for causing 70% of all cervical cancers. Ninety percent of anal cancers and genital warts are caused by HPV. Unless you have wart-like lesions in the throat or genital warts that you can see or feel, HPV usually does not cause symptoms. Therefore, people can be infected for long periods and pass it on to others without knowing it. HPV in pregnancy usually does not cause a problem for the mother or baby. If the mother has genital warts, the baby rarely gets infected. When the HPV infection is found to be pre-cancerous on the cervix, vagina, or vulva, the mother will be followed closely during the pregnancy. Any needed treatment will be done after the baby is born. CAUSES   Having unprotected sex. HPV can be spread by oral, vaginal, or anal sexual activity.  Having several sex partners.  Having a sex partner who has other sex partners.  Having or having had another sexually transmitted infection. SYMPTOMS   More than 90% of people carrying HPV cannot tell anything is wrong.  Wart-like lesions in the throat (from  having oral sex).  Warts in the infected skin or mucous membranes.  Genital warts may itch, burn, or bleed.  Genital warts may be painful or bleed during sexual intercourse. DIAGNOSIS   Genital warts are easily seen with the naked eye.  Currently, there is no FDA-approved test to detect HPV in males.  In females, a Pap test can show cells which are infected with HPV.  In females, a scope can be used to view the cervix (colposcopy). A colposcopy can be performed if the pelvic exam or Pap test is abnormal.  In females, a sample of tissue may be removed (biopsy) during the colposcopy. TREATMENT   Treatment of genital warts can include:  Podophyllin lotion or gel.  Bichloroacetic acid (BCA) or trichloroacetic acid (TCA).  Podofilox solution or gel.  Imiquimod cream.  Interferon injections.  Use of a probe to apply extreme cold (cryotherapy).  Application of an intense beam of light (laser treatment).  Use of a probe to apply extreme heat (electrocautery).  Surgery.  HPV of the cervix, vagina, or vulva can be treated with:  Cryotherapy.  Laser treatment.  Electrocautery.  Surgery. Your caregiver will follow you closely after you are treated. This is because the HPV can come back and may need treatment again. HOME CARE INSTRUCTIONS   Follow your  caregiver's instructions regarding medications, Pap tests, and follow-up exams.  Do not touch or scratch the warts.  Do not treat genital warts with medication used for treating hand warts.  Tell your sex partner about your infection because he or she may also need treatment.  Do not have sex while you are being treated.  After treatment, use condoms during sex to prevent future infections.  Have only 1 sex partner.  Have a sex partner who does not have other sex partners.  Use over-the-counter creams for itching or irritation as directed by your caregiver.  Use over-the-counter or prescription medicines for  pain, discomfort, or fever as directed by your caregiver.  Do not douche or use tampons during treatment of HPV. PREVENTION   Talk to your caregiver about getting the HPV vaccines. These vaccines prevent some HPV infections and cancers. It is recommended that the vaccine be given to males and females between the age of 9 and 26 years old. It will not work if you already have HPV and it is not recommended for pregnant women. The vaccines are not recommended for pregnant women.  Call your caregiver if you think you are pregnant and have the HPV.  A PAP test is done to screen for cervical cancer.  The first PAP test should be done at age 21.  Between ages 21 and 29, PAP tests are repeated every 2 years.  Beginning at age 30, you are advised to have a PAP test every 3 years as long as your past 3 PAP tests have been normal.  Some women have medical problems that increase the chance of getting cervical cancer. Talk to your caregiver about these problems. It is especially important to talk to your caregiver if a new problem develops soon after your last PAP test. In these cases, your caregiver may recommend more frequent screening and Pap tests.  The above recommendations are the same for women who have or have not gotten the vaccine for HPV (Human Papillomavirus).  If you had a hysterectomy for a problem that was not a cancer or a condition that could lead to cancer, then you no longer need Pap tests. However, even if you no longer need a PAP test, a regular exam is a good idea to make sure no other problems are starting.   If you are between ages 65 and 70, and you have had normal Pap tests going back 10 years, you no longer need Pap tests. However, even if you no longer need a PAP test, a regular exam is a good idea to make sure no other problems are starting.  If you have had past treatment for cervical cancer or a condition that could lead to cancer, you need Pap tests and screening for  cancer for at least 20 years after your treatment.  If Pap tests have been discontinued, risk factors (such as a new sexual partner)need to be re-assessed to determine if screening should be resumed.  Some women may need screenings more often if they are at high risk for cervical cancer. SEEK MEDICAL CARE IF:   The treated skin becomes red, swollen or painful.  You have an oral temperature above 102 F (38.9 C).  You feel generally ill.  You feel lumps or pimple-like projections in and around your genital area.  You develop bleeding of the vagina or the treatment area.  You develop painful sexual intercourse. Document Released: 01/21/2004 Document Revised: 01/23/2012 Document Reviewed: 02/05/2014 ExitCare Patient Information 2015 [employer],   LLC. This information is not intended to replace advice given to you by your health care provider. Make sure you discuss any questions you have with your health care provider.  

## 2014-09-12 ENCOUNTER — Telehealth: Payer: Self-pay | Admitting: Obstetrics and Gynecology

## 2014-09-12 ENCOUNTER — Ambulatory Visit: Payer: BC Managed Care – PPO | Admitting: Nurse Practitioner

## 2014-09-12 DIAGNOSIS — R87612 Low grade squamous intraepithelial lesion on cytologic smear of cervix (LGSIL): Secondary | ICD-10-CM

## 2014-09-12 NOTE — Telephone Encounter (Signed)
Please read information below. This is a new patient but she will need assistance with scheduling a colpo. Gabriel Cirri already checked her benefits.   ===View-only below this line===   ----- Message -----    From: Laqueta Due    Sent: 09/12/2014   2:50 PM      To: Brook E Amundson de Berton Lan, MD, * Subject: RE: Internal MD referral for: K10.312 (ICD-1*  Pre-cert complete. Patient liability is $25 copay.  Thank you, Gabriel Cirri  ----- Message -----    From: Odelia Gage, MD    Sent: 09/12/2014   1:37 PM      To: Ricka Burdock Subject: RE: Internal MD referral for: [phone] (ICD-1*  Please precert and schedule an appointment for colposcopy for abnormal pap showing low grade squamous intraepithelial lesion.   She will need instructions from the Triage team.  Thanks.   Josefa Half  ----- Message -----    From: Leeroy Bock    Sent: 09/12/2014  11:58 AM      To: Brook E Amundson de Berton Lan, MD, * Subject: Internal MD referral for: [phone] (ICD-10-CM*  Dr. Quincy Simmonds, Please review patient's record and advise if needs pre-certed prior to appointment? Thank you, Lavella Hammock

## 2014-09-14 ENCOUNTER — Encounter: Payer: Self-pay | Admitting: Nurse Practitioner

## 2014-09-14 DIAGNOSIS — F4322 Adjustment disorder with anxiety: Secondary | ICD-10-CM | POA: Insufficient documentation

## 2014-09-14 NOTE — Progress Notes (Signed)
   Subjective:    Patient ID: Cristina Davenport, female    DOB: 05/07/1986, 28 y.o.   MRN: [id number]  HPIpresents for her wellness exam.has Mirena IUD, rare spotting. Has had the same sexual partner for the past 2 years. STD testing has been normal. Healthy eater. Regular exercise 4-5 times per week.no visual problems. Regular dental care. Has been out of her medications since May when she lost her insurance. Does regular tanning.    Review of Systems  Constitutional: Negative for fever, activity change, appetite change and fatigue.  HENT: Negative for dental problem, ear pain, sinus pressure and sore throat.   Eyes: Negative for visual disturbance.  Respiratory: Negative for cough, chest tightness, shortness of breath and wheezing.   Cardiovascular: Negative for chest pain.  Gastrointestinal: Negative for nausea, vomiting, abdominal pain, diarrhea, constipation and abdominal distention.  Genitourinary: Negative for dysuria, urgency, frequency, vaginal discharge, enuresis, difficulty urinating, genital sores, menstrual problem and pelvic pain.  Psychiatric/Behavioral: The patient is nervous/anxious.        Objective:   Physical Exam  Constitutional: She is oriented to person, place, and time. She appears well-developed. No distress.  HENT:  Right Ear: External ear normal.  Left Ear: External ear normal.  Mouth/Throat: Oropharynx is clear and moist.  Neck: Normal range of motion. Neck supple. No tracheal deviation present. No thyromegaly present.  Cardiovascular: Normal rate, regular rhythm and normal heart sounds.  Exam reveals no gallop.   No murmur heard. Pulmonary/Chest: Effort normal and breath sounds normal.  Abdominal: Soft. She exhibits no distension. There is no tenderness.  Genitourinary: Vagina normal and uterus normal. No vaginal discharge found.  External GU: No rashes or lesions noted. Vagina no discharge. Cervix normal limits in appearance, no CMT. IUD string noted.  Bimanual exam no tenderness or masses.  Musculoskeletal: She exhibits no edema.  Lymphadenopathy:    She has no cervical adenopathy.  Neurological: She is alert and oriented to person, place, and time.  Skin: Skin is warm and dry. No rash noted.  Psychiatric: She has a normal mood and affect. Her behavior is normal.  Vitals reviewed. Breast exam: Dense tissue, minimal nodularity, no masses noted. Axilla no adenopathy.        Assessment & Plan:   Problem List Items Addressed This Visit      Other   Adjustment disorder with anxious mood    Other Visit Diagnoses    Well woman exam    -  Primary    Screening for cervical cancer        Relevant Orders       Pap IG w/ reflex to HPV when ASC-U (Completed)       Meds ordered this encounter  Medications  . escitalopram (LEXAPRO) 20 MG tablet    Sig: TAKE ONE TABLET BY MOUTH ONCE DAILY    Dispense:  30 tablet    Refill:  2    Order Specific Question:  Supervising Provider    Answer:  Mikey Kirschner [[id number]]  . clonazePAM (KLONOPIN) 0.5 MG tablet    Sig: TAKE ONE TABLET BY MOUTH TWICE DAILY AS NEEDED FOR ANXIETY    Dispense:  60 tablet    Refill:  2    Order Specific Question:  Supervising Provider    Answer:  Mikey Kirschner [[id number]]   Restart meds as directed. Discussed skin cancer prevention. Also reviewed safe sex issues. Return in about 3 months (around 12/10/2014).

## 2014-09-15 ENCOUNTER — Encounter: Payer: Self-pay | Admitting: Nurse Practitioner

## 2014-09-15 DIAGNOSIS — R87612 Low grade squamous intraepithelial lesion on cytologic smear of cervix (LGSIL): Secondary | ICD-10-CM | POA: Insufficient documentation

## 2014-09-15 NOTE — Telephone Encounter (Signed)
Spoke with patient. She has a Mirena IUD, very light if any cycles.   Colposcopy pre-procedure instructions given. Motrin instructions given. Motrin=Advil=Ibuprofen Can take 800 mg (Can purchase over the counter, you will need four 200 mg pills,  Take with food. Make sure to eat a meal before appointment and drink plenty of fluids. Patient verbalized understanding and will call to reschedule if will be on menses or has any concerns regarding pregnancy. Advised will need to cancel within 24 hours or will have $100.00 no show fee placed to account.   Scheduled colposcopy for 10/03/14 with Dr. Quincy Simmonds. Patient notified of out of pocket costs and cancellation fee.  Routing to provider for final review. Patient agreeable to disposition. Will close encounter

## 2014-09-15 NOTE — Telephone Encounter (Signed)
Message left to return call to [place] at [phone].   Will need to ensure patient still using Mirena IUD.  Will need colposcopy appointment with extra time per Lamont Snowball, RN.

## 2014-09-15 NOTE — Telephone Encounter (Signed)
Pt returning call

## 2014-09-15 NOTE — Progress Notes (Signed)
Subjective:  Presents to discuss her recent abnormal Pap smear. See physical exam note.  Objective:   BP 122/88 mmHg  Ht 5\' 1"  (1.549 m)  Wt 141 lb (63.957 kg)  BMI 26.66 kg/m2 Pap smear shows LS IL.  Assessment:Low grade squamous intraepithelial lesion on cytologic smear of cervix (lgsil) - Plan: Ambulatory referral to Gynecology  Plan: questions answered regarding Pap smear. Refer to gynecology for further evaluation. Patient to check with insurance about cost for Gardasil. Discussed safe sex issues. Given written and verbal information on HPV.

## 2014-10-03 ENCOUNTER — Encounter: Payer: Self-pay | Admitting: Obstetrics and Gynecology

## 2014-10-03 ENCOUNTER — Ambulatory Visit (INDEPENDENT_AMBULATORY_CARE_PROVIDER_SITE_OTHER): Payer: BC Managed Care – PPO | Admitting: Obstetrics and Gynecology

## 2014-10-03 DIAGNOSIS — Z23 Encounter for immunization: Secondary | ICD-10-CM

## 2014-10-03 DIAGNOSIS — R87612 Low grade squamous intraepithelial lesion on cytologic smear of cervix (LGSIL): Secondary | ICD-10-CM

## 2014-10-03 NOTE — Progress Notes (Signed)
GYNECOLOGY VISIT  PCP:   Referring provider:   HPI: 28 y.o.   Divorced  Caucasian  female   [id number] with No LMP recorded. Patient is not currently having periods (Reason: IUD).   here for   New GYN, abnormal pap (LSIL) on 09/09/14 This is first abnormal pap.   Former smoker.   Mirena IUD placed 2 years ago.  Uses for contraception and for treatment of bleeding with menstruation.  Some cramping.   Hgb:  no Urine:  no  GYNECOLOGIC HISTORY: No LMP recorded. Patient is not currently having periods (Reason: IUD). Sexually active:  yes Partner preference: female Contraception:   IUD Menopausal hormone therapy: no DES exposure:   no Blood transfusions:no    Sexually transmitted diseases: no    GYN procedures and prior surgeries:  no Last mammogram:             none    Last pap and high risk HPV testing:  10/ 27/15 (LSIL) History of abnormal pap smear:  no   OB History    Gravida Para Term Preterm AB TAB SAB Ectopic Multiple Living   2 1 1  1  1           LIFESTYLE: Exercise: yes, cardio and weight lifting              Tobacco: no Alcohol:socially Drug use:  no  Patient Active Problem List   Diagnosis Date Noted  . Low grade squamous intraepithelial lesion on cytologic smear of cervix (lgsil) 09/15/2014  . Adjustment disorder with anxious mood 09/14/2014  . Abdominal pain, epigastric 04/26/2013  . Nausea with vomiting 04/26/2013  . Gastritis 02/23/2013    Past Medical History  Diagnosis Date  . Anxiety   . Bipolar 1 disorder   . Anxiety   . GERD (gastroesophageal reflux disease)   . IBS (irritable bowel syndrome)   . Migraine without aura     Past Surgical History  Procedure Laterality Date  . Examination under anesthesia  04/21/2012    Gluteal wound repair  . Esophagogastroduodenoscopy N/A 04/30/2013    LYY:TKPTWSFK distal esophagus of uncertain clinical significance-status post biopsy. Tiny hiatal hernia. No explanation  . Abdominal surgery      cyst  removed   . Cholecystectomy N/A 07/10/2013    Procedure: LAPAROSCOPIC CHOLECYSTECTOMY;  Surgeon: Donato Heinz, MD;  Location: [facility];  Service: General;  Laterality: N/A;    Current Outpatient Prescriptions  Medication Sig Dispense Refill  . acyclovir (ZOVIRAX) 400 MG tablet TAKE ONE TABLET BY MOUTH THREE TIMES DAILY FOR 5 DAYS 15 tablet 0  . clonazePAM (KLONOPIN) 0.5 MG tablet TAKE ONE TABLET BY MOUTH TWICE DAILY AS NEEDED FOR ANXIETY 60 tablet 2  . escitalopram (LEXAPRO) 20 MG tablet TAKE ONE TABLET BY MOUTH ONCE DAILY 30 tablet 2  . levonorgestrel (MIRENA) 20 MCG/24HR IUD 1 each by Intrauterine route once.     No current facility-administered medications for this visit.     ALLERGIES: Doxycycline and Latex  Family History  Problem Relation Age of Onset  . Hypertension Maternal Grandmother   . Stroke Maternal Grandmother   . Hypertension Maternal Grandfather   . Stroke Maternal Grandfather   . Crohn's disease Maternal Aunt   . Crohn's disease Cousin   . Colon cancer Neg Hx   . Mental illness Mother     History   Social History  . Marital Status: Divorced    Spouse Name: N/A    Number of Children:  N/A  . Years of Education: N/A   Occupational History  . [employer]    Social History Main Topics  . Smoking status: Current Every Day Smoker -- 0.25 packs/day for 8 years    Types: Cigarettes  . Smokeless tobacco: Not on file  . Alcohol Use: Yes     Comment: occasionally,  social  . Drug Use: No  . Sexual Activity: Yes    Birth Control/ Protection: IUD   Other Topics Concern  . Not on file   Social History Narrative    ROS:  Pertinent items are noted in HPI.  PHYSICAL EXAMINATION:    BP 104/62 mmHg  Pulse 72  Ht 5' 2.5" (1.588 m)  Wt 141 lb (63.957 kg)  BMI 25.36 kg/m2  LMP    Wt Readings from Last 3 Encounters:  10/03/14 141 lb (63.957 kg)  09/11/14 141 lb (63.957 kg)  09/09/14 141 lb (63.957 kg)     Ht Readings from Last 3 Encounters:   10/03/14 5' 2.5" (1.588 m)  09/11/14 5\' 1"  (1.549 m)  09/09/14 5\' 1"  (1.549 m)    General appearance: alert, cooperative and appears stated age   Pelvic: External genitalia:  no lesions              Urethra:  normal appearing urethra with no masses, tenderness or lesions              Bartholins and Skenes: normal                 Vagina: normal appearing vagina with normal color and discharge, no lesions              Cervix: normal appearance.  IUD strings noted.                  Bimanual Exam:  Uterus:  uterus is normal size, shape, consistency and nontender                                      Adnexa: normal adnexa in size, nontender and no masses                                        ASSESSMENT  LGSIL pap.  Former smoker.  Mirena IUD patient.   PLAN  Discussion of abnormal paps, HPV, colposcopy, and treatments for abnormal paps.  Congratulated patient for stopping smoking and explained how this will impact her cervical health in a positive way.  Colposcopy.  See other note below.   An After Visit Summary was printed and given to the patient.  15 minutes face to face time of which over 50% was spent in counseling.

## 2014-10-03 NOTE — Patient Instructions (Signed)
Colposcopy Care After Colposcopy is a procedure in which a special tool is used to magnify the surface of the cervix. A tissue sample (biopsy) may also be taken. This sample will be looked at for cervical cancer or other problems. After the test:  You may have some cramping.  Lie down for a few minutes if you feel lightheaded.   You may have some bleeding which should stop in a few days. HOME CARE  Do not have sex or use tampons for 2 to 3 days or as told.  Only take medicine as told by your doctor.  Continue to take your birth control pills as usual. Finding out the results of your test Ask when your test results will be ready. Make sure you get your test results. GET HELP RIGHT AWAY IF:  You are bleeding a lot or are passing blood clots.  You develop a fever of 102 F (38.9 C) or higher.  You have abnormal vaginal discharge.  You have cramps that do not go away with medicine.  You feel lightheaded, dizzy, or pass out (faint). MAKE SURE YOU:   Understand these instructions.  Will watch your condition.  Will get help right away if you are not doing well or get worse. Document Released: 04/18/2008 Document Revised: 01/23/2012 Document Reviewed: 05/30/2013 [facility] Patient Information 2015 [place], [place]. This information is not intended to replace advice given to you by your health care provider. Make sure you discuss any questions you have with your health care provider.

## 2014-10-05 NOTE — Progress Notes (Signed)
Subjective:     Patient ID: Cristina Davenport, female   DOB: Apr 02, 1986, 28 y.o.   MRN: [id number]  HPI  Patient here for colposcopy for LGSIL.  LMP was a couple of months ago.  Has Mirena IUD.   Review of Systems     Objective:   Physical Exam  Genitourinary:           Colposcopy Consent for procedure. Speculum placed in vagina.  3% acetic acid used.  Colposcopy satisfactory.  White light and green field filter used.  Large areas of acetowhite change noted with mosaics at 6:00 and 12:00 positions.  ECC performed and sent to pathology.  Biopsy of 6:00 and then 12:00 sent to pathology, each separately.  Monsel's placed.  Minimal EBL.    Colposcopy of the vulva also performed with acetic acid.  No lesions noted.   No complications.  Assessment:     LGSIL pap.  Large areas of HPV change noted on the cervix.     Plan:     Precautions given.  Follow up colposcopic biopsies.  I would favor observational management if possible due to the very lage areas affected on the cervix.   After visit summary to patient.

## 2014-10-08 LAB — IPS OTHER TISSUE BIOPSY

## 2014-11-10 ENCOUNTER — Telehealth: Payer: Self-pay

## 2014-11-10 NOTE — Telephone Encounter (Signed)
Patient was transferred to front desk to schedule appointment.  

## 2014-11-10 NOTE — Telephone Encounter (Signed)
Nurse to call, this is a rather complex issue that may really need a office visit to help figure out. Try to find out a little more information please. Is this mainly an issue with diarrhea after eating? Burning in the stomach? Reflux issues?

## 2014-11-10 NOTE — Telephone Encounter (Signed)
LMRC

## 2014-11-10 NOTE — Telephone Encounter (Signed)
Pt called stating that she had gallbladder surgery about a yr ago. Pt is stating that she has had trouble eating and upset stomach problems Ever since the surgery. Wants to know if there is meds that she can  Take daily to help with this problem.

## 2014-11-12 ENCOUNTER — Ambulatory Visit (INDEPENDENT_AMBULATORY_CARE_PROVIDER_SITE_OTHER): Payer: BC Managed Care – PPO | Admitting: Family Medicine

## 2014-11-12 ENCOUNTER — Encounter: Payer: Self-pay | Admitting: Family Medicine

## 2014-11-12 VITALS — BP 102/74 | Temp 98.9°F | Ht 61.0 in | Wt 139.0 lb

## 2014-11-12 DIAGNOSIS — R197 Diarrhea, unspecified: Secondary | ICD-10-CM

## 2014-11-12 MED ORDER — CHOLESTYRAMINE 4 GM/DOSE PO POWD: 4.0000 g | Freq: Two times a day (BID) | ORAL | Status: DC

## 2014-11-12 MED ORDER — ONDANSETRON HCL 8 MG PO TABS: 8.0000 mg | ORAL_TABLET | Freq: Three times a day (TID) | ORAL | Status: DC | PRN

## 2014-11-12 NOTE — Progress Notes (Signed)
   Subjective:    Patient ID: Cristina Davenport, female    DOB: May 23, 1986, 28 y.o.   MRN: [id number]  Diarrhea  This is a new problem. The current episode started in the past 7 days. The stool consistency is described as mucous. The patient states that diarrhea does not awaken her from sleep. Associated symptoms include headaches. Associated symptoms comments: Fatigue, weak, nausea. Treatments tried: ibuprofen.   Patient relates chronic intermittent loose stools that's been progressive over the past year since having her gallbladder removed  She also relates loose mucousy stools intermittently over the past week along with some slight nausea and slight headache. She denies fever chills or bloody stools.   Review of Systems  Gastrointestinal: Positive for diarrhea.  Neurological: Positive for headaches.       Objective:   Physical Exam  Her lungs clear hearts regular pulse normal abdomen soft no guarding rebound or tenderness extremities no edema      Assessment & Plan:  #1 mucousy loose stools-I was concerned about the possibility of underlying illness I recommended some iliac testing stool testing for C. difficile as well as laboratory testing. Patient feels that this problem is more likely a viral illness it's been going on over the past several days she would like to give it to next week. If it has resolved by next week she does not want to go through further testing. If it is not resolved by next week we will need to do stool testing laboratory testing for celiac disease as well as potential referral to GI. Probable viral illness  #2 chronic intermittent abdominal cramps discomfort would loose stools ever since having her gallbladder removed-she is to try Questran 1 scoop daily to see how this does I believe it would help her.

## 2014-11-18 ENCOUNTER — Other Ambulatory Visit: Payer: Self-pay | Admitting: Family Medicine

## 2014-11-18 ENCOUNTER — Encounter: Payer: Self-pay | Admitting: Family Medicine

## 2014-11-18 MED ORDER — SULFAMETHOXAZOLE-TRIMETHOPRIM 800-160 MG PO TABS: 1.0000 | ORAL_TABLET | Freq: Two times a day (BID) | ORAL | Status: DC

## 2014-12-03 ENCOUNTER — Ambulatory Visit (INDEPENDENT_AMBULATORY_CARE_PROVIDER_SITE_OTHER): Payer: 59 | Admitting: *Deleted

## 2014-12-03 VITALS — BP 102/64 | HR 70 | Resp 12 | Ht 61.0 in | Wt 140.8 lb

## 2014-12-03 DIAGNOSIS — Z23 Encounter for immunization: Secondary | ICD-10-CM

## 2014-12-03 NOTE — Progress Notes (Signed)
Patient is here for 2nd Gardasil Injection first one was given 10/04/15.  Patient denies any reaction to previous Gardasil , is aware that 3rd Gardasil is due in 4 months.  Patient tolerated Injection well in Right Deltoild.  Routed to provider for review, encounter closed.

## 2014-12-04 NOTE — Progress Notes (Signed)
Encounter reviewed by Dr. Manolito Jurewicz Silva.  

## 2015-02-09 ENCOUNTER — Other Ambulatory Visit: Payer: Self-pay | Admitting: Nurse Practitioner

## 2015-02-12 ENCOUNTER — Other Ambulatory Visit: Payer: Self-pay | Admitting: *Deleted

## 2015-02-12 MED ORDER — CLONAZEPAM 0.5 MG PO TABS: 0.5000 mg | ORAL_TABLET | Freq: Two times a day (BID) | ORAL | Status: DC | PRN

## 2015-02-13 ENCOUNTER — Other Ambulatory Visit: Payer: Self-pay | Admitting: Nurse Practitioner

## 2015-03-19 ENCOUNTER — Other Ambulatory Visit: Payer: Self-pay | Admitting: Family Medicine

## 2015-03-31 ENCOUNTER — Other Ambulatory Visit: Payer: Self-pay | Admitting: Nurse Practitioner

## 2015-03-31 NOTE — Telephone Encounter (Signed)
1 refill needs office visit 

## 2015-04-03 ENCOUNTER — Ambulatory Visit (INDEPENDENT_AMBULATORY_CARE_PROVIDER_SITE_OTHER): Payer: 59 | Admitting: *Deleted

## 2015-04-03 VITALS — BP 110/66 | Resp 14 | Wt 148.2 lb

## 2015-04-03 DIAGNOSIS — Z23 Encounter for immunization: Secondary | ICD-10-CM

## 2015-04-03 NOTE — Progress Notes (Signed)
Patient is here for 3rd Gardasil  LMP:  No problems or issues with previous Gardasil injections 3rd Gardasil given in Right Deltoid, patient tolerated injection well Routed to provider for review, encounter closed.

## 2015-04-05 NOTE — Progress Notes (Signed)
Encounter reviewed by Dr. Truman Aceituno Silva.  

## 2015-04-17 ENCOUNTER — Encounter: Payer: Self-pay | Admitting: Nurse Practitioner

## 2015-04-17 ENCOUNTER — Ambulatory Visit (INDEPENDENT_AMBULATORY_CARE_PROVIDER_SITE_OTHER): Payer: 59 | Admitting: Nurse Practitioner

## 2015-04-17 VITALS — BP 134/76 | Ht 61.0 in | Wt 147.1 lb

## 2015-04-17 DIAGNOSIS — F419 Anxiety disorder, unspecified: Secondary | ICD-10-CM | POA: Insufficient documentation

## 2015-04-17 MED ORDER — TRAZODONE HCL 50 MG PO TABS: ORAL_TABLET | ORAL | Status: DC

## 2015-04-20 ENCOUNTER — Encounter: Payer: Self-pay | Admitting: Nurse Practitioner

## 2015-04-20 NOTE — Progress Notes (Signed)
Subjective:   Presents to discuss her anxiety. Has been under more stress lately.  Anxiety worse over the past 2 months.Does not take Klonopin every day but sometimes takes it 3 times a day. Compliant with Lexapro 20 mg. Denies suicidal or homicidal thoughts or ideation. Increased number of headaches  Worse with stress. Has an IUD for birth control, rare spotting.  Objective:   BP 134/76 mmHg  Ht 5\' 1"  (1.549 m)  Wt 147 lb 2 oz (66.735 kg)  BMI 27.81 kg/m2  NAD. Alert, oriented. Mildly anxious affect. Lungs clear. Heart regular rate rhythm.  Assessment:  Problem List Items Addressed This Visit      Other   Anxiety - Primary   Relevant Medications   traZODone (DESYREL) 50 MG tablet      Plan: reminded patient to take Klonopin sparingly.  Reviewed addiction potential. Limit to no more than 60 per month. Add trazodone to help sleep as well as anxiety. Reviewed potential adverse effects. DC med and call if any problems. Discussed importance of stress reduction. Return in about 6 months (around 10/17/2015).  Call back sooner if any problems.

## 2015-04-22 ENCOUNTER — Ambulatory Visit: Payer: 59 | Admitting: Nurse Practitioner

## 2015-04-28 ENCOUNTER — Other Ambulatory Visit: Payer: Self-pay | Admitting: Family Medicine

## 2015-05-14 ENCOUNTER — Other Ambulatory Visit: Payer: Self-pay | Admitting: Family Medicine

## 2015-05-15 ENCOUNTER — Other Ambulatory Visit: Payer: Self-pay | Admitting: Family Medicine

## 2015-05-15 NOTE — Telephone Encounter (Signed)
Last seen 04/17/15

## 2015-05-15 NOTE — Telephone Encounter (Signed)
decr to numb thirty now that carolyn has added trazadone nightly, use saparingly, no ref

## 2015-05-22 ENCOUNTER — Other Ambulatory Visit: Payer: Self-pay | Admitting: Nurse Practitioner

## 2015-05-22 ENCOUNTER — Telehealth: Payer: Self-pay | Admitting: Nurse Practitioner

## 2015-05-22 MED ORDER — PANTOPRAZOLE SODIUM 40 MG PO TBEC: 40.0000 mg | DELAYED_RELEASE_TABLET | Freq: Every day | ORAL | Status: DC

## 2015-05-22 NOTE — Telephone Encounter (Signed)
Patient is having a lot of trouble with acid relux and wants to know if we can call her in something.  She says she was prescribed something in the past, but doesn't know what.   Cristina Davenport

## 2015-06-22 ENCOUNTER — Other Ambulatory Visit: Payer: Self-pay | Admitting: *Deleted

## 2015-06-23 ENCOUNTER — Other Ambulatory Visit: Payer: Self-pay | Admitting: Nurse Practitioner

## 2015-06-23 ENCOUNTER — Other Ambulatory Visit: Payer: Self-pay

## 2015-06-23 MED ORDER — CLONAZEPAM 0.5 MG PO TABS: 0.5000 mg | ORAL_TABLET | Freq: Every day | ORAL | Status: DC | PRN

## 2015-06-23 NOTE — Progress Notes (Signed)
It looks like Nicki Reaper has already refilled this on 8/8.

## 2015-06-23 NOTE — Progress Notes (Signed)
May have this and 2 rf

## 2015-07-14 ENCOUNTER — Ambulatory Visit (INDEPENDENT_AMBULATORY_CARE_PROVIDER_SITE_OTHER): Payer: 59 | Admitting: Nurse Practitioner

## 2015-07-14 ENCOUNTER — Encounter: Payer: Self-pay | Admitting: Nurse Practitioner

## 2015-07-14 VITALS — BP 104/70 | HR 76 | Temp 99.0°F | Resp 14 | Wt 157.0 lb

## 2015-07-14 DIAGNOSIS — R350 Frequency of micturition: Secondary | ICD-10-CM | POA: Diagnosis not present

## 2015-07-14 DIAGNOSIS — N760 Acute vaginitis: Secondary | ICD-10-CM | POA: Diagnosis not present

## 2015-07-14 NOTE — Patient Instructions (Signed)
Bacterial Vaginosis Bacterial vaginosis is a vaginal infection that occurs when the normal balance of bacteria in the vagina is disrupted. It results from an overgrowth of certain bacteria. This is the most common vaginal infection in women of childbearing age. Treatment is important to prevent complications, especially in pregnant women, as it can cause a premature delivery. CAUSES  Bacterial vaginosis is caused by an increase in harmful bacteria that are normally present in smaller amounts in the vagina. Several different kinds of bacteria can cause bacterial vaginosis. However, the reason that the condition develops is not fully understood. RISK FACTORS Certain activities or behaviors can put you at an increased risk of developing bacterial vaginosis, including:  Having a new sex partner or multiple sex partners.  Douching.  Using an intrauterine device (IUD) for contraception. Women do not get bacterial vaginosis from toilet seats, bedding, swimming pools, or contact with objects around them. SIGNS AND SYMPTOMS  Some women with bacterial vaginosis have no signs or symptoms. Common symptoms include:  Grey vaginal discharge.  A fishlike odor with discharge, especially after sexual intercourse.  Itching or burning of the vagina and vulva.  Burning or pain with urination. DIAGNOSIS  Your health care provider will take a medical history and examine the vagina for signs of bacterial vaginosis. A sample of vaginal fluid may be taken. Your health care provider will look at this sample under a microscope to check for bacteria and abnormal cells. A vaginal pH test may also be done.  TREATMENT  Bacterial vaginosis may be treated with antibiotic medicines. These may be given in the form of a pill or a vaginal cream. A second round of antibiotics may be prescribed if the condition comes back after treatment.  HOME CARE INSTRUCTIONS   Only take over-the-counter or prescription medicines as  directed by your health care provider.  If antibiotic medicine was prescribed, take it as directed. Make sure you finish it even if you start to feel better.  Do not have sex until treatment is completed.  Tell all sexual partners that you have a vaginal infection. They should see their health care provider and be treated if they have problems, such as a mild rash or itching.  Practice safe sex by using condoms and only having one sex partner. SEEK MEDICAL CARE IF:   Your symptoms are not improving after 3 days of treatment.  You have increased discharge or pain.  You have a fever. MAKE SURE YOU:   Understand these instructions.  Will watch your condition.  Will get help right away if you are not doing well or get worse. FOR MORE INFORMATION  Centers for Disease Control and Prevention, Division of STD Prevention: www.cdc.gov/std American Sexual Health Association (ASHA): www.ashastd.org  Document Released: 10/31/2005 Document Revised: 08/21/2013 Document Reviewed: 06/12/2013 [employer] Patient Information 2015 [employer]. This information is not intended to replace advice given to you by your health care provider. Make sure you discuss any questions you have with your health care provider.  

## 2015-07-14 NOTE — Progress Notes (Signed)
29 y.o.Divorced white G1P1  here with complaint of vaginal symptoms of itching, burning, and increase discharge. Describes discharge as white to yellow thick with an odor. Onset X 5 days ago.  She had associated RLQ pain that was sharp and intermittent for only a few hours.  Started having some spotting on Saturday which is unusual for her with Mirena IUD. Nausea this am with some low back pain that was also vague and now gone.   Denies new personal products or vaginal dryness.   No STD concerns, same partner for 4 years. Urinary symptoms with usual frequency due to large intake of H2O.  She is weight lifter and normally consumes a lot of fluids.   Contraception is Mirena IUD insertion 2012. Last pap LGSIL with HPV 09/2014.  She has noted some skin tags and worried about condyloma.   O:Healthy female WDWN Affect: normal, orientation x 3  Exam: alert and in no distress Abdomen: soft and non tender.  Maybe slight discomfort right flank area Lymph node: no enlargement or tenderness Pelvic exam: External genital: normal female with what appears to be flesh colored skin tags right inguinal area.  There is one place closer to the buttock that might be HPV. BUS: negative Vagina: thin yellow to white discharge noted. Affirm taken Cervix: normal, non tender, no CMT, IUD strings is noted and no cervical motion tenderness Uterus: normal, non tender Adnexa:normal, non tender, no masses or fullness noted  Labs:  Affirm  Urine chemstrip and C&S  GC & Chl   A: R/O Vaginitis  Mirena IUD insertion 2012  History of abnormal pap LGSIL with HPV and colpo biopsy 10/03/14  Skin tags right inguinal area    P:Discussed findings of vaginitis and etiology. Discussed Aveeno or baking soda sitz bath for comfort. Avoid moist clothes or pads for extended period of time. If working out in gym clothes or swim suits for long periods of time change underwear or bottoms of swimsuit if possible. Olive Oil/Coconut Oil use for  skin protection prior to activity can be used to external skin.  Rx: hold RX pending results of test  When she returns in November plans to show Dr. Quincy Simmonds the skin tags and may want removal.  RV prn

## 2015-07-15 ENCOUNTER — Other Ambulatory Visit: Payer: Self-pay | Admitting: Nurse Practitioner

## 2015-07-15 LAB — WET PREP BY MOLECULAR PROBE
Candida species: POSITIVE — AB
Gardnerella vaginalis: POSITIVE — AB
Trichomonas vaginosis: NEGATIVE

## 2015-07-15 MED ORDER — METRONIDAZOLE 0.75 % VA GEL: 1.0000 | Freq: Every day | VAGINAL | Status: DC

## 2015-07-15 MED ORDER — FLUCONAZOLE 150 MG PO TABS: ORAL_TABLET | ORAL | Status: DC

## 2015-07-15 NOTE — Progress Notes (Signed)
Encounter reviewed by Dr. Honor Frison Amundson C. Silva.  

## 2015-07-16 LAB — URINE CULTURE
Colony Count: NO GROWTH
Organism ID, Bacteria: NO GROWTH

## 2015-07-16 LAB — IPS N GONORRHOEA AND CHLAMYDIA BY PCR

## 2015-07-21 ENCOUNTER — Telehealth: Payer: Self-pay | Admitting: Obstetrics and Gynecology

## 2015-07-21 NOTE — Telephone Encounter (Signed)
Left message on voicemail to call and reschedule cancelled appointment. °

## 2015-08-06 ENCOUNTER — Ambulatory Visit: Payer: 59 | Admitting: Nurse Practitioner

## 2015-09-18 ENCOUNTER — Ambulatory Visit: Payer: 59 | Admitting: Obstetrics and Gynecology

## 2015-09-23 ENCOUNTER — Encounter: Payer: Self-pay | Admitting: Nurse Practitioner

## 2015-09-23 ENCOUNTER — Ambulatory Visit (INDEPENDENT_AMBULATORY_CARE_PROVIDER_SITE_OTHER): Payer: 59 | Admitting: Nurse Practitioner

## 2015-09-23 VITALS — BP 120/66 | HR 72 | Ht 61.75 in | Wt 155.0 lb

## 2015-09-23 DIAGNOSIS — Z975 Presence of (intrauterine) contraceptive device: Secondary | ICD-10-CM | POA: Diagnosis not present

## 2015-09-23 DIAGNOSIS — Z0000 Encounter for general adult medical examination without abnormal findings: Secondary | ICD-10-CM

## 2015-09-23 DIAGNOSIS — Z01419 Encounter for gynecological examination (general) (routine) without abnormal findings: Secondary | ICD-10-CM

## 2015-09-23 DIAGNOSIS — R300 Dysuria: Secondary | ICD-10-CM

## 2015-09-23 LAB — POCT URINALYSIS DIPSTICK
Bilirubin, UA: NEGATIVE
Blood, UA: NEGATIVE
Glucose, UA: NEGATIVE
Ketones, UA: NEGATIVE
Leukocytes, UA: NEGATIVE
Nitrite, UA: NEGATIVE
Protein, UA: NEGATIVE
Urobilinogen, UA: NEGATIVE
pH, UA: 8

## 2015-09-23 LAB — HEMOGLOBIN, FINGERSTICK: Hemoglobin, fingerstick: 13.3 g/dL (ref 12.0–16.0)

## 2015-09-23 NOTE — Progress Notes (Signed)
Patient ID: Cristina Davenport, female   DOB: November 06, 1986, 29 y.o.   MRN: [id number] 29 y.o. [id number] Divorced  Caucasian Fe here for annual exam.  She has Mirena IUD since 2012 and wants it removed secondary to weight gain, bloating, increase in depression.  She is with same partner X 4 years and they do want a pregnancy in the fall of 2017.  She would like to get it out soon to feel better.  She did get pregnant on OCP in the past due to non compliance.  She wants no hormonal intervention at this time.  Recent UTI and treated with 3 rounds of Amoxil which caused yeast vaginitis.  She usually gets spotting with SA - states this is "normal" since IUD.  Patient's last menstrual period was 07/30/2015.      Mirena IUD 09/2011    Sexually active: Yes.    The current method of family planning is IUD.   Mirena placed approximately. 09/2011 at [facility]. Exercising: Yes.    Gym/ health club routine includes cardio and weights everyday.. Smoker:  Yes, 2-3 cigarettes per day  Health Maintenance: Pap: 09/09/14, LGSIL; Colpo 10/03/14, low grade dysplasia, neg ECC TDaP: 2005? Gardasil: completed 04/03/15 Labs: HB: 13.3  Urine:  Negative    reports that she quit smoking about a year ago. She does not have any smokeless tobacco history on file. She reports that she drinks alcohol. She reports that she does not use illicit drugs.  Past Medical History  Diagnosis Date  . Anxiety   . Bipolar 1 disorder ([place])   . Anxiety   . GERD (gastroesophageal reflux disease)   . IBS (irritable bowel syndrome)   . Migraine without aura     Past Surgical History  Procedure Laterality Date  . Examination under anesthesia  04/21/2012    Gluteal wound repair  . Esophagogastroduodenoscopy N/A 04/30/2013    [id number] distal esophagus of uncertain clinical significance-status post biopsy. Tiny hiatal hernia. No explanation  . Abdominal surgery      cyst removed   . Cholecystectomy N/A 07/10/2013    Procedure:  LAPAROSCOPIC CHOLECYSTECTOMY;  Surgeon: Donato Heinz, MD;  Location: [facility];  Service: General;  Laterality: N/A;    Current Outpatient Prescriptions  Medication Sig Dispense Refill  . acyclovir (ZOVIRAX) 400 MG tablet TAKE ONE TABLET BY MOUTH THREE TIMES DAILY FOR 5 DAYS 15 tablet 0  . clonazePAM (KLONOPIN) 0.5 MG tablet Take 1 tablet (0.5 mg total) by mouth daily as needed. Use sparingly 30 tablet 2  . escitalopram (LEXAPRO) 20 MG tablet TAKE 1 TABLET BY MOUTH ONCE DAILY 30 tablet 5  . levonorgestrel (MIRENA) 20 MCG/24HR IUD 1 each by Intrauterine route once.    . pantoprazole (PROTONIX) 40 MG tablet Take 1 tablet (40 mg total) by mouth daily. 30 tablet 5  . traZODone (DESYREL) 50 MG tablet 1/2 po qhs x 6 d then one po qhs 30 tablet 2  . ondansetron (ZOFRAN) 8 MG tablet Take 1 tablet (8 mg total) by mouth every 8 (eight) hours as needed for nausea. (Patient not taking: Reported on 07/14/2015) 12 tablet 1   No current facility-administered medications for this visit.    Family History  Problem Relation Age of Onset  . Hypertension Maternal Grandmother   . Stroke Maternal Grandmother   . Hypertension Maternal Grandfather   . Stroke Maternal Grandfather   . Crohn's disease Maternal Aunt   . Crohn's disease Cousin   . Colon cancer  Neg Hx   . Mental illness Mother     ROS:  Pertinent items are noted in HPI.  Otherwise, a comprehensive ROS was negative.  Exam:   BP 120/66 mmHg  Pulse 72  Ht 5' 1.75" (1.568 m)  Wt 155 lb (70.308 kg)  BMI 28.60 kg/m2  LMP 07/30/2015 Height: 5' 1.75" (156.8 cm) Ht Readings from Last 3 Encounters:  09/23/15 5' 1.75" (1.568 m)  04/17/15 5\' 1"  (1.549 m)  12/03/14 5\' 1"  (1.549 m)    General appearance: alert, cooperative and appears stated age Head: Normocephalic, without obvious abnormality, atraumatic Neck: no adenopathy, supple, symmetrical, trachea midline and thyroid normal to inspection and palpation Lungs: clear to auscultation  bilaterally Breasts: normal appearance, no masses or tenderness Heart: regular rate and rhythm Abdomen: soft, non-tender; no masses,  no organomegaly Extremities: extremities normal, atraumatic, no cyanosis or edema Skin: Skin color, texture, turgor normal. No rashes or lesions Lymph nodes: Cervical, supraclavicular, and axillary nodes normal. No abnormal inguinal nodes palpated Neurologic: Grossly normal   Pelvic: External genitalia:  no lesions, several skin tags around the labial folds - one area that could be HPV at the introitus at 6:00 position - unchanged since last seen in August              Urethra:  normal appearing urethra with no masses, tenderness or lesions              Bartholin's and Skene's: normal                 Vagina: normal appearing vagina with normal color and discharge, no lesions              Cervix: anteverted IUD strings are visible              Pap taken: Yes.   Bimanual Exam:  Uterus:  normal size, contour, position, consistency, mobility, non-tender              Adnexa: no mass, fullness, tenderness               Rectovaginal: Confirms               Anus:  normal sphincter tone, no lesions  Chaperone present: yes  A:  Well Woman with normal exam  S/P Mirena IUD 11/ 2012  Recent UTI - will get TOC since slight symptoms  Symptoms of depression, bloating, increased acne  History of LGSIL with colpo biopsy 10/03/14  P:   Reviewed health and wellness pertinent to exam  Pap smear as above  Will follow with pap and urine C&S  Will place order for removal of Mirena IUD  Will have MD to look at areas of skin tags and ? 1 area that could be early HPV  Counseled on breast self exam, STD prevention, HIV risk factors and prevention, adequate intake of calcium and vitamin D, diet and exercise return annually or prn  An After Visit Summary was printed and given to the patient.

## 2015-09-23 NOTE — Patient Instructions (Signed)
General topics  Next pap or exam is  due in 1 year Take a Women's multivitamin Take 1200 mg. of calcium daily - prefer dietary If any concerns in interim to call back  Breast Self-Awareness Practicing breast self-awareness may pick up problems early, prevent significant medical complications, and possibly save your life. By practicing breast self-awareness, you can become familiar with how your breasts look and feel and if your breasts are changing. This allows you to notice changes early. It can also offer you some reassurance that your breast health is good. One way to learn what is normal for your breasts and whether your breasts are changing is to do a breast self-exam. If you find a lump or something that was not present in the past, it is best to contact your caregiver right away. Other findings that should be evaluated by your caregiver include nipple discharge, especially if it is bloody; skin changes or reddening; areas where the skin seems to be pulled in (retracted); or new lumps and bumps. Breast pain is seldom associated with cancer (malignancy), but should also be evaluated by a caregiver. BREAST SELF-EXAM The best time to examine your breasts is 5 7 days after your menstrual period is over.  ExitCare Patient Information 2013 [employer].   Exercise to Stay Healthy Exercise helps you become and stay healthy. EXERCISE IDEAS AND TIPS Choose exercises that:  You enjoy.  Fit into your day. You do not need to exercise really hard to be healthy. You can do exercises at a slow or medium level and stay healthy. You can:  Stretch before and after working out.  Try yoga, Pilates, or tai chi.  Lift weights.  Walk fast, swim, jog, run, climb stairs, bicycle, dance, or rollerskate.  Take aerobic classes. Exercises that burn about 150 calories:  Running 1  miles in 15 minutes.  Playing volleyball for 45 to 60 minutes.  Washing and waxing a car for 45 to 60  minutes.  Playing touch football for 45 minutes.  Walking 1  miles in 35 minutes.  Pushing a stroller 1  miles in 30 minutes.  Playing basketball for 30 minutes.  Raking leaves for 30 minutes.  Bicycling 5 miles in 30 minutes.  Walking 2 miles in 30 minutes.  Dancing for 30 minutes.  Shoveling snow for 15 minutes.  Swimming laps for 20 minutes.  Walking up stairs for 15 minutes.  Bicycling 4 miles in 15 minutes.  Gardening for 30 to 45 minutes.  Jumping rope for 15 minutes.  Washing windows or floors for 45 to 60 minutes. Document Released: 12/03/2010 Document Revised: 01/23/2012 Document Reviewed: 12/03/2010 ExitCare Patient Information 2013 [employer].   Other topics ( that may be useful information):    Sexually Transmitted Disease Sexually transmitted disease (STD) refers to any infection that is passed from person to person during sexual activity. This may happen by way of saliva, semen, blood, vaginal mucus, or urine. Common STDs include:  Gonorrhea.  Chlamydia.  Syphilis.  HIV/AIDS.  Genital herpes.  Hepatitis B and C.  Trichomonas.  Human papillomavirus (HPV).  Pubic lice. CAUSES  An STD may be spread by bacteria, virus, or parasite. A person can get an STD by:  Sexual intercourse with an infected person.  Sharing sex toys with an infected person.  Sharing needles with an infected person.  Having intimate contact with the genitals, mouth, or rectal areas of an infected person. SYMPTOMS  Some people may not have any symptoms, but   they can still pass the infection to others. Different STDs have different symptoms. Symptoms include:  Painful or bloody urination.  Pain in the pelvis, abdomen, vagina, anus, throat, or eyes.  Skin rash, itching, irritation, growths, or sores (lesions). These usually occur in the genital or anal area.  Abnormal vaginal discharge.  Penile discharge in men.  Soft, flesh-colored skin growths in the  genital or anal area.  Fever.  Pain or bleeding during sexual intercourse.  Swollen glands in the groin area.  Yellow skin and eyes (jaundice). This is seen with hepatitis. DIAGNOSIS  To make a diagnosis, your caregiver may:  Take a medical history.  Perform a physical exam.  Take a specimen (culture) to be examined.  Examine a sample of discharge under a microscope.  Perform blood test TREATMENT   Chlamydia, gonorrhea, trichomonas, and syphilis can be cured with antibiotic medicine.  Genital herpes, hepatitis, and HIV can be treated, but not cured, with prescribed medicines. The medicines will lessen the symptoms.  Genital warts from HPV can be treated with medicine or by freezing, burning (electrocautery), or surgery. Warts may come back.  HPV is a virus and cannot be cured with medicine or surgery.However, abnormal areas may be followed very closely by your caregiver and may be removed from the cervix, vagina, or vulva through office procedures or surgery. If your diagnosis is confirmed, your recent sexual partners need treatment. This is true even if they are symptom-free or have a negative culture or evaluation. They should not have sex until their caregiver says it is okay. HOME CARE INSTRUCTIONS  All sexual partners should be informed, tested, and treated for all STDs.  Take your antibiotics as directed. Finish them even if you start to feel better.  Only take over-the-counter or prescription medicines for pain, discomfort, or fever as directed by your caregiver.  Rest.  Eat a balanced diet and drink enough fluids to keep your urine clear or pale yellow.  Do not have sex until treatment is completed and you have followed up with your caregiver. STDs should be checked after treatment.  Keep all follow-up appointments, Pap tests, and blood tests as directed by your caregiver.  Only use latex condoms and water-soluble lubricants during sexual activity. Do not use  petroleum jelly or oils.  Avoid alcohol and illegal drugs.  Get vaccinated for HPV and hepatitis. If you have not received these vaccines in the past, talk to your caregiver about whether one or both might be right for you.  Avoid risky sex practices that can break the skin. The only way to avoid getting an STD is to avoid all sexual activity.Latex condoms and dental dams (for oral sex) will help lessen the risk of getting an STD, but will not completely eliminate the risk. SEEK MEDICAL CARE IF:   You have a fever.  You have any new or worsening symptoms. Document Released: 01/21/2003 Document Revised: 01/23/2012 Document Reviewed: 01/28/2011 [facility] Patient Information 2013 [place].    Domestic Abuse You are being battered or abused if someone close to you hits, pushes, or physically hurts you in any way. You also are being abused if you are forced into activities. You are being sexually abused if you are forced to have sexual contact of any kind. You are being emotionally abused if you are made to feel worthless or if you are constantly threatened. It is important to remember that help is available. No one has the right to abuse you. PREVENTION OF FURTHER  ABUSE  Learn the warning signs of danger. This varies with situations but may include: the use of alcohol, threats, isolation from friends and family, or forced sexual contact. Leave if you feel that violence is going to occur.  If you are attacked or beaten, report it to the police so the abuse is documented. You do not have to press charges. The police can protect you while you or the attackers are leaving. Get the officer's name and badge number and a copy of the report.  Find someone you can trust and tell them what is happening to you: your caregiver, a nurse, clergy member, close friend or family member. Feeling ashamed is natural, but remember that you have done nothing wrong. No one deserves abuse. Document Released:  10/28/2000 Document Revised: 01/23/2012 Document Reviewed: 01/06/2011 ExitCare Patient Information 2013 ExitCare[employer].    How Much is Too Much Alcohol? Drinking too much alcohol can cause injury, accidents, and health problems. These types of problems can include:   Car crashes.  Falls.  Family fighting (domestic violence).  Drowning.  Fights.  Injuries.  Burns.  Damage to certain organs.  Having a baby with birth defects. ONE DRINK CAN BE TOO MUCH WHEN YOU ARE:  Working.  Pregnant or breastfeeding.  Taking medicines. Ask your doctor.  Driving or planning to drive. If you or someone you know has a drinking problem, get help from a doctor.  Document Released: 08/27/2009 Document Revised: 01/23/2012 Document Reviewed: 08/27/2009 ExitCare Patient Information 2013 [employer].   Smoking Hazards Smoking cigarettes is extremely bad for your health. Tobacco smoke has over 200 known poisons in it. There are over 60 chemicals in tobacco smoke that cause cancer. Some of the chemicals found in cigarette smoke include:   Cyanide.  Benzene.  Formaldehyde.  Methanol (wood alcohol).  Acetylene (fuel used in welding torches).  Ammonia. Cigarette smoke also contains the poisonous gases nitrogen oxide and carbon monoxide.  Cigarette smokers have an increased risk of many serious medical problems and Smoking causes approximately:  90% of all lung cancer deaths in men.  80% of all lung cancer deaths in women.  90% of deaths from chronic obstructive lung disease. Compared with nonsmokers, smoking increases the risk of:  Coronary heart disease by 2 to 4 times.  Stroke by 2 to 4 times.  Men developing lung cancer by 23 times.  Women developing lung cancer by 13 times.  Dying from chronic obstructive lung diseases by 12 times.  . Smoking is the most preventable cause of death and disease in our society.  WHY IS SMOKING ADDICTIVE?  Nicotine is the chemical  agent in tobacco that is capable of causing addiction or dependence.  When you smoke and inhale, nicotine is absorbed rapidly into the bloodstream through your lungs. Nicotine absorbed through the lungs is capable of creating a powerful addiction. Both inhaled and non-inhaled nicotine may be addictive.  Addiction studies of cigarettes and spit tobacco show that addiction to nicotine occurs mainly during the teen years, when young people begin using tobacco products. WHAT ARE THE BENEFITS OF QUITTING?  There are many health benefits to quitting smoking.   Likelihood of developing cancer and heart disease decreases. Health improvements are seen almost immediately.  Blood pressure, pulse rate, and breathing patterns start returning to normal soon after quitting. QUITTING SMOKING   American Lung Association - 1-800-LUNGUSA  American Cancer Society - 1-800-ACS-2345 Document Released: 12/08/2004 Document Revised: 01/23/2012 Document Reviewed: 08/12/2009 ExitCare Patient Information 2013 ExitCare,   LLC.   Stress Management Stress is a state of physical or mental tension that often results from changes in your life or normal routine. Some common causes of stress are:  Death of a loved one.  Injuries or severe illnesses.  Getting fired or changing jobs.  Moving into a new home. Other causes may be:  Sexual problems.  Business or financial losses.  Taking on a large debt.  Regular conflict with someone at home or at work.  Constant tiredness from lack of sleep. It is not just bad things that are stressful. It may be stressful to:  Win the lottery.  Get married.  Buy a new car. The amount of stress that can be easily tolerated varies from person to person. Changes generally cause stress, regardless of the types of change. Too much stress can affect your health. It may lead to physical or emotional problems. Too little stress (boredom) may also become stressful. SUGGESTIONS TO  REDUCE STRESS:  Talk things over with your family and friends. It often is helpful to share your concerns and worries. If you feel your problem is serious, you may want to get help from a professional counselor.  Consider your problems one at a time instead of lumping them all together. Trying to take care of everything at once may seem impossible. List all the things you need to do and then start with the most important one. Set a goal to accomplish 2 or 3 things each day. If you expect to do too many in a single day you will naturally fail, causing you to feel even more stressed.  Do not use alcohol or drugs to relieve stress. Although you may feel better for a short time, they do not remove the problems that caused the stress. They can also be habit forming.  Exercise regularly - at least 3 times per week. Physical exercise can help to relieve that "uptight" feeling and will relax you.  The shortest distance between despair and hope is often a good night's sleep.  Go to bed and get up on time allowing yourself time for appointments without being rushed.  Take a short "time-out" period from any stressful situation that occurs during the day. Close your eyes and take some deep breaths. Starting with the muscles in your face, tense them, hold it for a few seconds, then relax. Repeat this with the muscles in your neck, shoulders, hand, stomach, back and legs.  Take good care of yourself. Eat a balanced diet and get plenty of rest.  Schedule time for having fun. Take a break from your daily routine to relax. HOME CARE INSTRUCTIONS   Call if you feel overwhelmed by your problems and feel you can no longer manage them on your own.  Return immediately if you feel like hurting yourself or someone else. Document Released: 04/26/2001 Document Revised: 01/23/2012 Document Reviewed: 12/17/2007 [facility] Patient Information 2013 [place].

## 2015-09-24 LAB — URINE CULTURE
Colony Count: NO GROWTH
Organism ID, Bacteria: NO GROWTH

## 2015-09-25 LAB — IPS PAP TEST WITH HPV

## 2015-09-27 NOTE — Progress Notes (Signed)
Encounter reviewed by Dr. Izola Teague Amundson C. Silva.  

## 2015-09-28 ENCOUNTER — Telehealth: Payer: Self-pay | Admitting: Nurse Practitioner

## 2015-09-28 NOTE — Telephone Encounter (Signed)
Called patient to review benefits for procedure. Left voicemail to call back and review. °

## 2015-10-01 ENCOUNTER — Telehealth: Payer: Self-pay | Admitting: Family Medicine

## 2015-10-01 ENCOUNTER — Other Ambulatory Visit: Payer: Self-pay | Admitting: Family Medicine

## 2015-10-01 DIAGNOSIS — F419 Anxiety disorder, unspecified: Secondary | ICD-10-CM

## 2015-10-01 NOTE — Telephone Encounter (Signed)
May have this and 2 refills-please put oin Rx each rx to last 30 days

## 2015-10-01 NOTE — Telephone Encounter (Signed)
Refill notice given to nurses, best for pt not to use greater than 30 klonopin per month unless compelling reason to avoid addiction potential

## 2015-10-02 ENCOUNTER — Encounter: Payer: Self-pay | Admitting: Obstetrics and Gynecology

## 2015-10-02 ENCOUNTER — Ambulatory Visit (INDEPENDENT_AMBULATORY_CARE_PROVIDER_SITE_OTHER): Payer: 59 | Admitting: Obstetrics and Gynecology

## 2015-10-02 VITALS — BP 104/64 | HR 66 | Ht 61.75 in | Wt 156.0 lb

## 2015-10-02 DIAGNOSIS — Z975 Presence of (intrauterine) contraceptive device: Secondary | ICD-10-CM

## 2015-10-02 DIAGNOSIS — R635 Abnormal weight gain: Secondary | ICD-10-CM | POA: Diagnosis not present

## 2015-10-02 NOTE — Progress Notes (Signed)
Patient ID: Cristina Davenport, female   DOB: 05/18/1986, 29 y.o.   MRN: [id number] GYNECOLOGY  VISIT   HPI: 29 y.o.   Divorced  Caucasian  female   [id number] with Patient's last menstrual period was 09/25/2015 (approximate).   here for Mirena IUD removal.  Plans to use condoms for birth control. Gained 30 pounds in 4 years.  Weight lifting.  Doing cardio.  Taking medication for anxiety and depression. On Lexapro and Klonopin.   Planning on pregnancy next year.   Patient also complains of vaginal discharge with itching and odor.  She states she used Metrogel in vagina last PM.   GYNECOLOGIC HISTORY: Patient's last menstrual period was 09/25/2015 (approximate). Contraception:Mirena IUD--inserted 2012 Menopausal hormone therapy: n/a Last mammogram: n/a Last pap smear: 09-23-15 ASCUS:Neg HR HPV.        OB History    Gravida Para Term Preterm AB TAB SAB Ectopic Multiple Living   2 1 1  1  1             Patient Active Problem List   Diagnosis Date Noted  . Anxiety 04/17/2015  . Low grade squamous intraepithelial lesion on cytologic smear of cervix (lgsil) 09/15/2014  . Adjustment disorder with anxious mood 09/14/2014  . Abdominal pain, epigastric 04/26/2013  . Nausea with vomiting 04/26/2013  . Gastritis 02/23/2013    Past Medical History  Diagnosis Date  . Anxiety   . Bipolar 1 disorder ([place])   . Anxiety   . GERD (gastroesophageal reflux disease)   . IBS (irritable bowel syndrome)   . Migraine without aura     Past Surgical History  Procedure Laterality Date  . Examination under anesthesia  04/21/2012    Gluteal wound repair  . Esophagogastroduodenoscopy N/A 04/30/2013    [id number] distal esophagus of uncertain clinical significance-status post biopsy. Tiny hiatal hernia. No explanation  . Abdominal surgery      cyst removed   . Cholecystectomy N/A 07/10/2013    Procedure: LAPAROSCOPIC CHOLECYSTECTOMY;  Surgeon: Donato Heinz, MD;  Location: [facility];  Service: General;   Laterality: N/A;    Current Outpatient Prescriptions  Medication Sig Dispense Refill  . acyclovir (ZOVIRAX) 400 MG tablet TAKE ONE TABLET BY MOUTH THREE TIMES DAILY FOR 5 DAYS 15 tablet 0  . clonazePAM (KLONOPIN) 0.5 MG tablet Take 1 tablet (0.5 mg total) by mouth daily as needed. Use sparingly 30 tablet 2  . escitalopram (LEXAPRO) 20 MG tablet TAKE 1 TABLET BY MOUTH ONCE DAILY 30 tablet 5  . levonorgestrel (MIRENA) 20 MCG/24HR IUD 1 each by Intrauterine route once.    . ondansetron (ZOFRAN) 8 MG tablet Take 1 tablet (8 mg total) by mouth every 8 (eight) hours as needed for nausea. 12 tablet 1  . pantoprazole (PROTONIX) 40 MG tablet Take 1 tablet (40 mg total) by mouth daily. 30 tablet 5  . traZODone (DESYREL) 50 MG tablet 1/2 po qhs x 6 d then one po qhs 30 tablet 2   No current facility-administered medications for this visit.     ALLERGIES: Doxycycline and Latex  Family History  Problem Relation Age of Onset  . Hypertension Maternal Grandmother   . Stroke Maternal Grandmother   . Hypertension Maternal Grandfather   . Stroke Maternal Grandfather   . Crohn's disease Maternal Aunt   . Crohn's disease Cousin   . Colon cancer Neg Hx   . Mental illness Mother     Social History   Social History  . Marital Status:  Divorced    Spouse Name: N/A  . Number of Children: N/A  . Years of Education: N/A   Occupational History  . [employer]    Social History Main Topics  . Smoking status: Former Smoker -- 0.00 packs/day for 8 years    Quit date: 09/19/2014  . Smokeless tobacco: Not on file  . Alcohol Use: 0.0 oz/week    0 Standard drinks or equivalent per week     Comment: occasionally,  social  . Drug Use: No  . Sexual Activity: Yes    Birth Control/ Protection: IUD     Comment: Mirena inserted 2012   Other Topics Concern  . Not on file   Social History Narrative    ROS:  Pertinent items are noted in HPI.  PHYSICAL EXAMINATION:    BP 104/64 mmHg  Pulse 66   Ht 5' 1.75" (1.568 m)  Wt 156 lb (70.761 kg)  BMI 28.78 kg/m2  LMP 09/25/2015 (Approximate)    General appearance: alert, cooperative and appears stated age    Pelvic: External genitalia:  Skin tags versus condyloma of the vulva.               Urethra:  normal appearing urethra with no masses, tenderness or lesions              Bartholins and Skenes: normal                 Vagina: normal appearing vagina with normal color and discharge, no lesions              Cervix: no lesions and Metrogel noted.  IUD strings seen.                Bimanual Exam:  Uterus:  normal size, contour, position, consistency, mobility, non-tender              Adnexa: normal adnexa and no mass, fullness, tenderness              Mirena IUD removal. Verbal consent for procedure. Risks and benefits explained.  Ring forceps used to remove IUD, which was intact.   IUD discarded.  Chaperone was present for exam.  ASSESSMENT  IUD removal.  Desire for future pregnancy.  Weight gain.  I told patient that I think this is multifactorial.  The Mirena may be contributing, but I think the Lexapro is also playing a role.  Pap ASCUS and negative HR HPV.   PLAN  Counseled regarding expectation for menses to occur and have quick return to fertility.  Will use condoms. Start PNV.  I discussed Lexapro and risk of weight gain associated with this.  We talked about Wellbutrin as a possible substitute for this.  She will contact her prescriber, PCP, in [place] regarding this.  Written information to patient also about this.  Follow up for yearly exam and pap.   An After Visit Summary was printed and given to the patient.  ___15___ minutes face to face time of which over 50% was spent in counseling.

## 2015-10-02 NOTE — Patient Instructions (Signed)
Bupropion extended-release tablets (Depression/Mood Disorders)  What is this medicine?  BUPROPION (byoo PROE pee on) is used to treat depression.  This medicine may be used for other purposes; ask your health care provider or pharmacist if you have questions.  What should I tell my health care provider before I take this medicine?  They need to know if you have any of these conditions:  -an eating disorder, such as anorexia or bulimia  -bipolar disorder or psychosis  -diabetes or high blood sugar, treated with medication  -glaucoma  -head injury or brain tumor  -heart disease, previous heart attack, or irregular heart beat  -high blood pressure  -kidney or liver disease  -seizures (convulsions)  -suicidal thoughts or a previous suicide attempt  -Tourette's syndrome  -weight loss  -an unusual or allergic reaction to bupropion, other medicines, foods, dyes, or preservatives  -breast-feeding  -pregnant or trying to become pregnant  How should I use this medicine?  Take this medicine by mouth with a glass of water. Follow the directions on the prescription label. You can take it with or without food. If it upsets your stomach, take it with food. Do not crush, chew, or cut these tablets. This medicine is taken once daily at the same time each day. Do not take your medicine more often than directed. Do not stop taking this medicine suddenly except upon the advice of your doctor. Stopping this medicine too quickly may cause serious side effects or your condition may worsen.  A special MedGuide will be given to you by the pharmacist with each prescription and refill. Be sure to read this information carefully each time.  Talk to your pediatrician regarding the use of this medicine in children. Special care may be needed.  Overdosage: If you think you have taken too much of this medicine contact a poison control center or emergency room at once.  NOTE: This medicine is only for you. Do not share this medicine with  others.  What if I miss a dose?  If you miss a dose, skip the missed dose and take your next tablet at the regular time. Do not take double or extra doses.  What may interact with this medicine?  Do not take this medicine with any of the following medications:  -linezolid  -MAOIs like Azilect, Carbex, Eldepryl, Marplan, Nardil, and Parnate  -methylene blue (injected into a vein)  -other medicines that contain bupropion like Zyban  This medicine may also interact with the following medications:  -alcohol  -certain medicines for anxiety or sleep  -certain medicines for blood pressure like metoprolol, propranolol  -certain medicines for depression or psychotic disturbances  -certain medicines for HIV or AIDS like efavirenz, lopinavir, nelfinavir, ritonavir  -certain medicines for irregular heart beat like propafenone, flecainide  -certain medicines for Parkinson's disease like amantadine, levodopa  -certain medicines for seizures like carbamazepine, phenytoin, phenobarbital  -cimetidine  -clopidogrel  -cyclophosphamide  -furazolidone  -isoniazid  -nicotine  -orphenadrine  -procarbazine  -steroid medicines like prednisone or cortisone  -stimulant medicines for attention disorders, weight loss, or to stay awake  -tamoxifen  -theophylline  -thiotepa  -ticlopidine  -tramadol  -warfarin  This list may not describe all possible interactions. Give your health care provider a list of all the medicines, herbs, non-prescription drugs, or dietary supplements you use. Also tell them if you smoke, drink alcohol, or use illegal drugs. Some items may interact with your medicine.  What should I watch for while using this medicine?    Tell your doctor if your symptoms do not get better or if they get worse. Visit your doctor or health care professional for regular checks on your progress. Because it may take several weeks to see the full effects of this medicine, it is important to continue your treatment as prescribed by your  doctor.  Patients and their families should watch out for new or worsening thoughts of suicide or depression. Also watch out for sudden changes in feelings such as feeling anxious, agitated, panicky, irritable, hostile, aggressive, impulsive, severely restless, overly excited and hyperactive, or not being able to sleep. If this happens, especially at the beginning of treatment or after a change in dose, call your health care professional.  Avoid alcoholic drinks while taking this medicine. Drinking large amounts of alcoholic beverages, using sleeping or anxiety medicines, or quickly stopping the use of these agents while taking this medicine may increase your risk for a seizure.  Do not drive or use heavy machinery until you know how this medicine affects you. This medicine can impair your ability to perform these tasks.  Do not take this medicine close to bedtime. It may prevent you from sleeping.  Your mouth may get dry. Chewing sugarless gum or sucking hard candy, and drinking plenty of water may help. Contact your doctor if the problem does not go away or is severe.  The tablet shell for some brands of this medicine does not dissolve. This is normal. The tablet shell may appear whole in the stool. This is not a cause for concern.  What side effects may I notice from receiving this medicine?  Side effects that you should report to your doctor or health care professional as soon as possible:  -allergic reactions like skin rash, itching or hives, swelling of the face, lips, or tongue  -breathing problems  -changes in vision  -confusion  -fast or irregular heartbeat  -hallucinations  -increased blood pressure  -redness, blistering, peeling or loosening of the skin, including inside the mouth  -seizures  -suicidal thoughts or other mood changes  -unusually weak or tired  -vomiting  Side effects that usually do not require medical attention (report to your doctor or health care professional if they continue or are  bothersome):  -change in sex drive or performance  -constipation  -headache  -loss of appetite  -nausea  -tremors  -weight loss  This list may not describe all possible side effects. Call your doctor for medical advice about side effects. You may report side effects to FDA at 1-800-FDA-1088.  Where should I keep my medicine?  Keep out of the reach of children.  Store at room temperature between 15 and 30 degrees C (59 and 86 degrees F). Throw away any unused medicine after the expiration date.  NOTE: This sheet is a summary. It may not cover all possible information. If you have questions about this medicine, talk to your doctor, pharmacist, or health care provider.     © 2016, Elsevier/Gold Standard. (2013-05-24 12:39:42)

## 2015-10-04 NOTE — Telephone Encounter (Signed)
See notation in

## 2015-10-05 ENCOUNTER — Telehealth: Payer: Self-pay | Admitting: *Deleted

## 2015-10-05 NOTE — Telephone Encounter (Signed)
I have attempted to contact this patient by phone with the following results: left message to return call to [place] at [phone] on answering machine (mobile per [facility]).  No personal information given.  [phone] (Mobile)

## 2015-10-05 NOTE — Telephone Encounter (Signed)
-----   Message from Kem Boroughs, [place] sent at 09/28/2015  8:17 AM EST ----- pap 08 -repeat co testing.

## 2015-10-21 ENCOUNTER — Ambulatory Visit: Payer: 59 | Admitting: Nurse Practitioner

## 2015-10-26 ENCOUNTER — Other Ambulatory Visit: Payer: Self-pay | Admitting: Nurse Practitioner

## 2015-11-04 ENCOUNTER — Encounter: Payer: Self-pay | Admitting: Nurse Practitioner

## 2015-11-04 ENCOUNTER — Ambulatory Visit (INDEPENDENT_AMBULATORY_CARE_PROVIDER_SITE_OTHER): Payer: 59 | Admitting: Nurse Practitioner

## 2015-11-04 VITALS — BP 104/70 | Ht 61.0 in | Wt 155.1 lb

## 2015-11-04 DIAGNOSIS — R5383 Other fatigue: Secondary | ICD-10-CM | POA: Diagnosis not present

## 2015-11-04 DIAGNOSIS — F419 Anxiety disorder, unspecified: Secondary | ICD-10-CM | POA: Diagnosis not present

## 2015-11-04 MED ORDER — PHENTERMINE HCL 37.5 MG PO TABS: 37.5000 mg | ORAL_TABLET | Freq: Every day | ORAL | Status: DC

## 2015-11-04 MED ORDER — ESCITALOPRAM OXALATE 20 MG PO TABS: 20.0000 mg | ORAL_TABLET | Freq: Every day | ORAL | Status: DC

## 2015-11-04 MED ORDER — TRAZODONE HCL 50 MG PO TABS: ORAL_TABLET | ORAL | Status: DC

## 2015-11-05 LAB — TSH: TSH: 0.805 u[IU]/mL (ref 0.450–4.500)

## 2015-11-07 ENCOUNTER — Encounter: Payer: Self-pay | Admitting: Nurse Practitioner

## 2015-11-07 NOTE — Progress Notes (Signed)
Subjective:  Presents for recheck. Had Mirena removed due to possible side effects. Same sexual partner for past 4 1/2 years. Sleeping well using Trazodone. Using Klonopin about 4-5 x per week. Regular exercise. Watching diet. Mild fatigue. Having difficulty losing weight.   Objective:   BP 104/70 mmHg  Ht 5\' 1"  (1.549 m)  Wt 155 lb 2 oz (70.364 kg)  BMI 29.33 kg/m2 NAD. Alert, oriented. Lungs clear. Heart RRR. Thyroid non tender, no mass or goiter noted.   Assessment:  Problem List Items Addressed This Visit      Other   Anxiety - Primary   Relevant Medications   escitalopram (LEXAPRO) 20 MG tablet   traZODone (DESYREL) 50 MG tablet    Other Visit Diagnoses    Other fatigue        Relevant Orders    TSH (Completed)    Morbid obesity, unspecified obesity type ([place])        Relevant Medications    phentermine (ADIPEX-P) 37.5 MG tablet    Other Relevant Orders    TSH (Completed)      Plan:  Meds ordered this encounter  Medications  . escitalopram (LEXAPRO) 20 MG tablet    Sig: Take 1 tablet (20 mg total) by mouth daily.    Dispense:  90 tablet    Refill:  1    Order Specific Question:  Supervising Provider    Answer:  Mikey Kirschner [[id number]]  . traZODone (DESYREL) 50 MG tablet    Sig: One po qhs    Dispense:  90 tablet    Refill:  1    Order Specific Question:  Supervising Provider    Answer:  Mikey Kirschner [[id number]]  . phentermine (ADIPEX-P) 37.5 MG tablet    Sig: Take 1 tablet (37.5 mg total) by mouth daily before breakfast.    Dispense:  30 tablet    Refill:  0    Order Specific Question:  Supervising Provider    Answer:  Mikey Kirschner [[id number]]   Continue healthy diet and regular exercise. Reviewed potential side effects of Phentermine. DC med if any problems. Recheck in one month if she wishes to continue. Otherwise routine follow up in 6 months.

## 2015-12-04 ENCOUNTER — Ambulatory Visit (INDEPENDENT_AMBULATORY_CARE_PROVIDER_SITE_OTHER): Payer: 59 | Admitting: Nurse Practitioner

## 2015-12-04 ENCOUNTER — Encounter: Payer: Self-pay | Admitting: Nurse Practitioner

## 2015-12-04 VITALS — BP 104/70 | Ht 61.0 in | Wt 146.0 lb

## 2015-12-04 DIAGNOSIS — F419 Anxiety disorder, unspecified: Secondary | ICD-10-CM

## 2015-12-04 MED ORDER — PHENTERMINE HCL 37.5 MG PO TABS: 37.5000 mg | ORAL_TABLET | Freq: Every day | ORAL | Status: DC

## 2015-12-06 ENCOUNTER — Encounter: Payer: Self-pay | Admitting: Nurse Practitioner

## 2015-12-06 NOTE — Progress Notes (Signed)
Subjective:  Presents for recheck. Has changed her eating habits. Regular workouts. No side effects on Phentermine. Sleeping well. Anxiety stable on Lexapro and Trazodone.   Objective:   BP 104/70 mmHg  Ht 5\' 1"  (1.549 m)  Wt 146 lb (66.225 kg)  BMI 27.60 kg/m2 NAD. Alert, oriented. Lungs clear. Heart RRR. Has lost 9 lbs since last visit.   Assessment:  Problem List Items Addressed This Visit      Other   Anxiety - Primary     Plan:  Meds ordered this encounter  Medications  . phentermine (ADIPEX-P) 37.5 MG tablet    Sig: Take 1 tablet (37.5 mg total) by mouth daily before breakfast.    Dispense:  30 tablet    Refill:  2    Order Specific Question:  Supervising Provider    Answer:  Mikey Kirschner [[id number]]   Return in about 6 months (around 06/02/2016) for recheck.

## 2015-12-10 ENCOUNTER — Telehealth: Payer: Self-pay | Admitting: Obstetrics and Gynecology

## 2015-12-10 DIAGNOSIS — N9089 Other specified noninflammatory disorders of vulva and perineum: Secondary | ICD-10-CM

## 2015-12-10 NOTE — Telephone Encounter (Signed)
Left message to call Fatina Sprankle at [phone]. 

## 2015-12-10 NOTE — Telephone Encounter (Signed)
Patient calling to schedule to have skin tags removed.

## 2015-12-10 NOTE — Telephone Encounter (Signed)
Spoke with patient. Patient would like to be seen for skin tag removal at this time. Appointment scheduled with Dr.Silva on 02/09/20117 at 3 pm. She is agreeable to date and time. Please see note below regarding skin tags from her aex with Kem Boroughs, FNP on 09/23/2015. Order placed for precert.  P: Reviewed health and wellness pertinent to exam Pap smear as above Will follow with pap and urine C&S Will place order for removal of Mirena IUD Will have MD to look at areas of skin tags and ? 1 area that could be early HPV Counseled on breast self exam, STD prevention, HIV risk factors and prevention, adequate intake of calcium and vitamin D, diet and exercise return annually or prn  Routing to provider for final review. Patient agreeable to disposition. Will close encounter.

## 2015-12-24 ENCOUNTER — Ambulatory Visit: Payer: 59 | Admitting: Obstetrics and Gynecology

## 2015-12-24 DIAGNOSIS — J111 Influenza due to unidentified influenza virus with other respiratory manifestations: Secondary | ICD-10-CM | POA: Diagnosis not present

## 2015-12-31 NOTE — Telephone Encounter (Signed)
Results given at previous office visit.  Ending follow up.  Closing encounter.

## 2016-01-20 ENCOUNTER — Other Ambulatory Visit: Payer: Self-pay | Admitting: Family Medicine

## 2016-01-20 NOTE — Telephone Encounter (Signed)
May have this and 2 refills 

## 2016-01-21 ENCOUNTER — Ambulatory Visit (INDEPENDENT_AMBULATORY_CARE_PROVIDER_SITE_OTHER): Payer: 59 | Admitting: Obstetrics and Gynecology

## 2016-01-21 ENCOUNTER — Encounter: Payer: Self-pay | Admitting: Obstetrics and Gynecology

## 2016-01-21 VITALS — BP 104/60 | HR 88 | Ht 61.75 in | Wt 136.8 lb

## 2016-01-21 DIAGNOSIS — D280 Benign neoplasm of vulva: Secondary | ICD-10-CM | POA: Diagnosis not present

## 2016-01-21 DIAGNOSIS — N907 Vulvar cyst: Secondary | ICD-10-CM | POA: Diagnosis not present

## 2016-01-21 DIAGNOSIS — N9089 Other specified noninflammatory disorders of vulva and perineum: Secondary | ICD-10-CM | POA: Diagnosis not present

## 2016-01-21 DIAGNOSIS — D215 Benign neoplasm of connective and other soft tissue of pelvis: Secondary | ICD-10-CM | POA: Diagnosis not present

## 2016-01-21 NOTE — Patient Instructions (Signed)

## 2016-01-21 NOTE — Progress Notes (Signed)
Subjective:     Patient ID: Cristina Davenport, female   DOB: Feb 25, 1986, 30 y.o.   MRN: [id number]  HPI  Patient here today for vulvar skin tag versus condyloma removal.  Pap 09/23/15 - ASCUS and negative HR HPV.  Review of Systems  LMP:01-11-16 light Contraception: None UPT: negative.     Objective:   Physical Exam  Genitourinary:     Procedure - Excision of skin tags and TCA to condylomatous lesions. Consent for procedure.  Sterile prep with betadine.  Local 1% lidocaine to all skin tags. - lot number [id number], exp. 01/12/17. Sharp excision of skin tags (4 - 6 mm each)  - right labia, right buttock, and then left buttock.  All specimens sent to pathology separately.  To GPA. AgNO3 placed.  Simple suture of 3/0 vicryl to right labia and left buttock.  Good hemostasis.  Minimal EBL.  85% TCA used to treat region 4, just to the right of the anus at 10:00 and right medial thigh.   No complications to biopsies or TCA tx.    Assessment:     Skin tags of vulva.  Condyloma. LGSIL by colpo biopsy in 09/2014. Pap 09/2015 ASCUS and negative HR HPV.    Plan:     Extensive discussion regarding abnormal paps, HPV, and condyloma.  Follow up biopsy results.  Return in 8 days for suture removal and discussion of results.   __15_____ minutes face to face time of which over 50% was spent in counseling.   After visit summary to patient.

## 2016-01-29 ENCOUNTER — Encounter: Payer: Self-pay | Admitting: Obstetrics and Gynecology

## 2016-01-29 ENCOUNTER — Ambulatory Visit (INDEPENDENT_AMBULATORY_CARE_PROVIDER_SITE_OTHER): Payer: 59 | Admitting: Obstetrics and Gynecology

## 2016-01-29 VITALS — BP 100/62 | HR 88 | Ht 61.75 in | Wt 137.2 lb

## 2016-01-29 DIAGNOSIS — A630 Anogenital (venereal) warts: Secondary | ICD-10-CM | POA: Diagnosis not present

## 2016-01-29 NOTE — Progress Notes (Signed)
Patient ID: Cristina Davenport, female   DOB: 1986/05/07, 30 y.o.   MRN: [id number] GYNECOLOGY  VISIT   HPI: 30 y.o.   Divorced  Caucasian  female   [id number] with Patient's last menstrual period was 01/11/2016.   here for follow up visit. Had vulvar biopsies and tx to condyloma at last visit.  Patient upset about condyloma at last visit.   Pathology for all biopsies showed squamous papillomas with minimal koilocytic change.  Patient states she has been having "hot flushes" along with nausea and dizziness for one week.  No bowel movement in one week. Feels fine today.   UPT this am negative for patient.   Not taking Phentermine regularly.  GYNECOLOGIC HISTORY: Patient's last menstrual period was 01/11/2016. Contraception:none Menopausal hormone therapy: n/a Last mammogram: n/a Last pap smear: 09-23-15 ASCUS:Neg HR HPV        OB History    Gravida Para Term Preterm AB TAB SAB Ectopic Multiple Living   2 1 1  1  1             Patient Active Problem List   Diagnosis Date Noted  . Anxiety 04/17/2015  . Low grade squamous intraepithelial lesion on cytologic smear of cervix (lgsil) 09/15/2014  . Adjustment disorder with anxious mood 09/14/2014  . Abdominal pain, epigastric 04/26/2013  . Nausea with vomiting 04/26/2013  . Gastritis 02/23/2013    Past Medical History  Diagnosis Date  . Anxiety   . Bipolar 1 disorder ([place])   . Anxiety   . GERD (gastroesophageal reflux disease)   . IBS (irritable bowel syndrome)   . Migraine without aura     Past Surgical History  Procedure Laterality Date  . Examination under anesthesia  04/21/2012    Gluteal wound repair  . Esophagogastroduodenoscopy N/A 04/30/2013    [id number] distal esophagus of uncertain clinical significance-status post biopsy. Tiny hiatal hernia. No explanation  . Abdominal surgery      cyst removed   . Cholecystectomy N/A 07/10/2013    Procedure: LAPAROSCOPIC CHOLECYSTECTOMY;  Surgeon: Donato Heinz, MD;  Location: [facility];  Service: General;  Laterality: N/A;    Current Outpatient Prescriptions  Medication Sig Dispense Refill  . acyclovir (ZOVIRAX) 400 MG tablet TAKE ONE TABLET BY MOUTH THREE TIMES DAILY FOR 5 DAYS 15 tablet 0  . clonazePAM (KLONOPIN) 0.5 MG tablet TAKE 1 TABLET BY MOUTH ONCE DAILY AS NEEDED "USE SPARINGLY" 30 tablet 2  . escitalopram (LEXAPRO) 20 MG tablet Take 1 tablet (20 mg total) by mouth daily. 90 tablet 1  . ondansetron (ZOFRAN) 8 MG tablet Take 1 tablet (8 mg total) by mouth every 8 (eight) hours as needed for nausea. 12 tablet 1  . phentermine (ADIPEX-P) 37.5 MG tablet Take 1 tablet (37.5 mg total) by mouth daily before breakfast. 30 tablet 2  . traZODone (DESYREL) 50 MG tablet One po qhs 90 tablet 1   No current facility-administered medications for this visit.     ALLERGIES: Doxycycline and Latex  Family History  Problem Relation Age of Onset  . Hypertension Maternal Grandmother   . Stroke Maternal Grandmother   . Hypertension Maternal Grandfather   . Stroke Maternal Grandfather   . Crohn's disease Maternal Aunt   . Crohn's disease Cousin   . Colon cancer Neg Hx   . Mental illness Mother     Social History   Social History  . Marital Status: Divorced    Spouse Name: N/A  . Number of Children: N/A  .  Years of Education: N/A   Occupational History  . [employer]    Social History Main Topics  . Smoking status: Former Smoker -- 0.00 packs/day for 8 years    Quit date: 09/19/2014  . Smokeless tobacco: Not on file  . Alcohol Use: 0.0 oz/week    0 Standard drinks or equivalent per week     Comment: occasionally,  social  . Drug Use: No  . Sexual Activity:    Partners: Male    Patent examiner Protection: None   Other Topics Concern  . Not on file   Social History Narrative    ROS:  Pertinent items are noted in HPI.  PHYSICAL EXAMINATION:    BP 100/62 mmHg  Pulse 88  Ht 5' 1.75" (1.568 m)  Wt 137 lb 3.2 oz (62.234 kg)  BMI 25.31 kg/m2   LMP 01/11/2016    General appearance: alert, cooperative and appears stated age   Pelvic: External genitalia:  Biopsy sites healing well. Vicryl suture removed.  Suture on left is already loose and falling out. Suture on right removed.  TCA treatment site of right thigh with eschar noted.                                             Condyloma of the right perianal region treated with TCA - 80%.  Lot number  [id number]    ----  expiration 03/18/16   Chaperone was present for exam.  ASSESSMENT  Vulvar condyloma. One area retreated again today. Status post excision of vulvar skin tags that showed minimal koilocytic change.  PLAN  Counseled extensively regarding HPV, transmission of HPV, condyloma, and abnormal paps.  Questions answered. Discussed potential treatment of condyloma with Aldara cream at home if needed. Annual exam with [facility] in November at which time pap is due.    An After Visit Summary was printed and given to the patient.  __15___ minutes face to face time of which over 50% was spent in counseling.

## 2016-02-17 ENCOUNTER — Telehealth: Payer: Self-pay | Admitting: Family Medicine

## 2016-02-17 NOTE — Telephone Encounter (Signed)
Patient says that whenever she seems to eat dairy or gluten products she feels really yucky and bloated.  She wants to know if this is something she would need to be seen for? It is hard for her to take off work right now. She doesn't believe her symptoms are life threatening, but she would just like advice on this.  Please advise.

## 2016-02-17 NOTE — Telephone Encounter (Signed)
Spoke with patient and informed her that office visit would be necessary. Patient verbalized understanding and was transferred to front desk to schedule office visit.

## 2016-02-19 ENCOUNTER — Ambulatory Visit: Payer: 59 | Admitting: Family Medicine

## 2016-02-19 ENCOUNTER — Encounter: Payer: Self-pay | Admitting: Family Medicine

## 2016-02-19 ENCOUNTER — Ambulatory Visit (INDEPENDENT_AMBULATORY_CARE_PROVIDER_SITE_OTHER): Payer: 59 | Admitting: Family Medicine

## 2016-02-19 VITALS — BP 112/70 | Temp 98.5°F | Ht 61.0 in | Wt 138.0 lb

## 2016-02-19 DIAGNOSIS — R519 Headache, unspecified: Secondary | ICD-10-CM

## 2016-02-19 DIAGNOSIS — K589 Irritable bowel syndrome without diarrhea: Secondary | ICD-10-CM

## 2016-02-19 DIAGNOSIS — R51 Headache: Secondary | ICD-10-CM | POA: Diagnosis not present

## 2016-02-19 DIAGNOSIS — L680 Hirsutism: Secondary | ICD-10-CM

## 2016-02-19 MED ORDER — LINACLOTIDE 145 MCG PO CAPS: 145.0000 ug | ORAL_CAPSULE | Freq: Every day | ORAL | Status: DC

## 2016-02-19 MED ORDER — HYOSCYAMINE SULFATE 0.125 MG SL SUBL: 0.1250 mg | SUBLINGUAL_TABLET | Freq: Four times a day (QID) | SUBLINGUAL | Status: DC | PRN

## 2016-02-19 NOTE — Progress Notes (Signed)
   Subjective:    Patient ID: Cristina Davenport, female    DOB: Dec 18, 1985, 30 y.o.   MRN: [id number]  Abdominal Pain This is a new problem. The current episode started 1 to 4 weeks ago. The problem occurs intermittently. The problem has been unchanged. The pain is located in the generalized abdominal region. The abdominal pain does not radiate. Associated symptoms include constipation, headaches and nausea. Pertinent negatives include no diarrhea or vomiting. Associated symptoms comments: bloating. The pain is aggravated by eating. The pain is relieved by nothing. Treatments tried: ibuprofen. The treatment provided no relief.  Patient relates hirsutism she is concerned about this this started off just recently she also relates that in the past she had problems with cysts on her ovaries she isn't sure if that is a issue or not currently her cycles are more regular  She also relates she is having frequent spells where she sometimes feels hot sometimes dizzy intermittent headaches she describes the headache is moderate severe mostly time behind the left eye sometimes temporal region causes nausea no vomiting occasionally wakes her up at night occasionally causes tingling in the left hand in the side of her face denies any loss of consciousness with the headache    Review of Systems  Gastrointestinal: Positive for nausea, abdominal pain and constipation. Negative for vomiting, diarrhea and rectal pain.       Abd bloating, stays for all day, every few days, moderate pain with it  Neurological: Positive for dizziness (light headed, unsteady) and headaches.       Objective:   Physical Exam HEENT benign neck no masses lungs clear no crackles heart regular abdomen soft with mild epigastric midabdominal pain no guarding rebound or tenderness no masses extremities no edema       Assessment & Plan:  Frequent headaches-patient does have some concerning symptomatology. I believe she is having complex  migraines. The patient is one Cristina Davenport get pregnant soon to therefore that complicates treatment for now I'd recommend Tylenol or ibuprofen in addition to this I recommend seeing neurology for further evaluation I don't feel patient needs to have scan at this point  Irritable bowel syndrome with constipation-Levsin SL as directed to see if that helps with abdominal discomfort go ahead and start Linzess standard dosing patient will let us know over the next several weeks how that is doing. Hopefully will help with the constipation and bloating  Hirsutism with possible underlying PCO S will be doing some additional lab work coming up with follow-up with [place] or with Cristina Forster FNP

## 2016-02-22 ENCOUNTER — Encounter: Payer: Self-pay | Admitting: Family Medicine

## 2016-02-24 ENCOUNTER — Encounter: Payer: Self-pay | Admitting: Family Medicine

## 2016-02-24 ENCOUNTER — Ambulatory Visit (INDEPENDENT_AMBULATORY_CARE_PROVIDER_SITE_OTHER): Payer: 59 | Admitting: Family Medicine

## 2016-02-24 VITALS — BP 132/80 | Temp 99.3°F | Ht 61.0 in | Wt 137.0 lb

## 2016-02-24 DIAGNOSIS — R51 Headache: Secondary | ICD-10-CM

## 2016-02-24 DIAGNOSIS — R519 Headache, unspecified: Secondary | ICD-10-CM

## 2016-02-24 DIAGNOSIS — N926 Irregular menstruation, unspecified: Secondary | ICD-10-CM

## 2016-02-24 DIAGNOSIS — R110 Nausea: Secondary | ICD-10-CM | POA: Diagnosis not present

## 2016-02-24 LAB — POCT URINE PREGNANCY: Preg Test, Ur: NEGATIVE

## 2016-02-24 MED ORDER — ONDANSETRON 8 MG PO TBDP: 8.0000 mg | ORAL_TABLET | Freq: Three times a day (TID) | ORAL | Status: DC | PRN

## 2016-02-24 MED ORDER — SUMATRIPTAN SUCCINATE 100 MG PO TABS: ORAL_TABLET | ORAL | Status: DC

## 2016-02-24 MED ORDER — CLONAZEPAM 0.5 MG PO TABS: ORAL_TABLET | ORAL | Status: DC

## 2016-02-24 NOTE — Telephone Encounter (Signed)
Patient scheduled appt for today for evaluation with Dr Nicki Reaper

## 2016-02-24 NOTE — Progress Notes (Signed)
   Subjective:    Patient ID: Cristina Davenport, female    DOB: [birth year], 30 y.o.   MRN: [id number]  HPIConstant Headache since yesterday, stiffness left side of face and neck, left eye droopy and right eye twitching. Left ear pain. Started around 2 days ago. Taking goody powder and aleve. Pt states she has had bell's palsy in the past and had same symptoms.   Pain in right shoulder blade. Intermittent sharp pains in her right upper shoulder blade been going on off and on for the  Abdominal bloating and nausea. Started yesterday. Irregular periods. Home pregnancy test negative. hcg test in office today negative.  Significant constipation issues lens is helping some Requesting refill on klonopin.  Patient has chronic anxieties.   Review of Systems    denies rectal bleeding denies fever chills sweats denies hematochezia hematuria Objective:   Physical Exam EOMI cranial nerves II-12 normal neck no masses lungs clear heart regular abdomen soft No unilateral numbness or weakness neck no masses lungs clear      Assessment & Plan:  Migraine complex-if numbness tingling or weakness becomes more progressive or pronounced definitely let us know. Or go to ER. Imitrex when necessary for headaches keep track of headaches follow-up with neurology. May end up needing MRI no sign of Bell's palsy or stroke on today's exam nausea medicine when necessary  Patient will thought she was pregnant urine pregnancy negative  Refill of Klonopin given occasionally uses it 2 times a day  The patient has reoccurring right upper trapezius pain this could be impingement from nerve in the neck if persistent may need workup with nerve conduction study for EMG  Patient having significant constipation at the current dose of lens this is not working by next week she will let us know we will increase the dose to next dose up

## 2016-03-04 ENCOUNTER — Encounter: Payer: Self-pay | Admitting: Neurology

## 2016-03-04 ENCOUNTER — Ambulatory Visit (INDEPENDENT_AMBULATORY_CARE_PROVIDER_SITE_OTHER): Payer: 59 | Admitting: Neurology

## 2016-03-04 VITALS — BP 126/74 | HR 91 | Ht 60.0 in | Wt 136.2 lb

## 2016-03-04 DIAGNOSIS — G43009 Migraine without aura, not intractable, without status migrainosus: Secondary | ICD-10-CM

## 2016-03-04 DIAGNOSIS — H02402 Unspecified ptosis of left eyelid: Secondary | ICD-10-CM

## 2016-03-04 DIAGNOSIS — H539 Unspecified visual disturbance: Secondary | ICD-10-CM | POA: Diagnosis not present

## 2016-03-04 DIAGNOSIS — M542 Cervicalgia: Secondary | ICD-10-CM

## 2016-03-04 DIAGNOSIS — N926 Irregular menstruation, unspecified: Secondary | ICD-10-CM | POA: Diagnosis not present

## 2016-03-04 DIAGNOSIS — R51 Headache: Secondary | ICD-10-CM

## 2016-03-04 DIAGNOSIS — I7771 Dissection of carotid artery: Secondary | ICD-10-CM

## 2016-03-04 DIAGNOSIS — R519 Headache, unspecified: Secondary | ICD-10-CM

## 2016-03-04 MED ORDER — METOCLOPRAMIDE HCL 10 MG PO TABS: ORAL_TABLET | ORAL | Status: DC

## 2016-03-04 MED ORDER — TOPIRAMATE ER 100 MG PO CAP24: 100.0000 mg | ORAL_CAPSULE | Freq: Every day | ORAL | Status: DC

## 2016-03-04 NOTE — Progress Notes (Signed)
[facility]    Provider:  Dr Jaynee Eagles Referring Provider: Kathyrn Drown, MD Primary Care Physician:  Sallee Lange, MD  CC:  Migraines, neck pain, laziness of left eye, twitch in right eye, stiffness in left side of face and neck.   HPI:  Cristina Davenport is a 30 y.o. female here as a referral from Dr. Wolfgang Phoenix for headaches. She has a PMHx of anxiety, bipolar disorder, IBS, migraines. She is very anxious in the office and crying. Headaches started a few months ago. She never had headaches in the past. No inciting events or triggers. The headaches are on the left side. Dull ache around the eye/above the eye. Travels to the frontal region. Head throbbing, light bothers her, her neck started feeling hot, felt weird, had to go home and stop driving one day. She felt fatigued and then went to bed. She has had more of these events within the last few weeks, increased frequency and severity, 2 a week, these headaches last 24 hours all day long. She has had droopiness of the left eye which she says is chronic but worsening, stiffness and pain in the jaw and left side of the neck, pain shooting behind the left eye and stabbing down the face and left neck. She is having twitching of the right eye which started last week. Also blurry vision. The left eye and left side of the nostril tears with the headaches. She had bells palsy on the right in the past (which may account for the asymmetry of the lids). She has stabbing pain above the right eye or above the left eye but never both. Most headaches are on the left side. She does not always have stabbing. No idea if any family history of migraines. No other focal neurologic deficits or symptoms.   Reviewed notes, labs and imaging from outside physicians, which showed:  She is apatient of scott [place]. She also relates she is having frequent spells where she sometimes feels hot sometimes dizzy intermittent headaches she describes the headache is  moderate severe mostly behind the left eye sometimes temporal region causes nausea no vomiting occasionally wakes her up at night occasionally causes tingling in the left hand in the side of her face denies any loss of consciousness with the headache  Review of Systems: Patient complains of symptoms per HPI as well as the following symptoms: fatigue, blurred vision, feeling hot and cold, snoring, constipation, aching muscles, confusion, headache, numbness, weakness, dizziness, depression, anxiety, decreased appetite, change in appetite, insomnia. Pertinent negatives per HPI. All others negative.   Social History   Social History  . Marital Status: Divorced    Spouse Name: N/A  . Number of Children: 1  . Years of Education: 12   Occupational History  . [employer]    Social History Main Topics  . Smoking status: Former Smoker -- 0.00 packs/day for 8 years    Quit date: 09/19/2014  . Smokeless tobacco: Not on file  . Alcohol Use: 0.0 oz/week    0 Standard drinks or equivalent per week     Comment: occasionally,  social  . Drug Use: No  . Sexual Activity:    Partners: Male    Patent examiner Protection: None   Other Topics Concern  . 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 Hypertension  Maternal Grandmother   . Stroke Maternal Grandmother   . Hypertension Maternal Grandfather   . Stroke Maternal Grandfather   . Crohn's disease Maternal Aunt   . Crohn's disease Cousin   . Colon cancer Neg Hx   . Mental illness Mother     Past Medical History  Diagnosis Date  . Anxiety   . Bipolar 1 disorder ([place])   . Anxiety   . GERD (gastroesophageal reflux disease)   . IBS (irritable bowel syndrome)   . Migraine without aura     Past Surgical History  Procedure Laterality Date  . Examination under anesthesia  04/21/2012    Gluteal wound repair  .  Esophagogastroduodenoscopy N/A 04/30/2013    [id number] distal esophagus of uncertain clinical significance-status post biopsy. Tiny hiatal hernia. No explanation  . Abdominal surgery      cyst removed   . Cholecystectomy N/A 07/10/2013    Procedure: LAPAROSCOPIC CHOLECYSTECTOMY;  Surgeon: Donato Heinz, MD;  Location: [facility];  Service: General;  Laterality: N/A;    Current Outpatient Prescriptions  Medication Sig Dispense Refill  . clonazePAM (KLONOPIN) 0.5 MG tablet 1 bid prn 40 tablet 4  . escitalopram (LEXAPRO) 20 MG tablet Take 1 tablet (20 mg total) by mouth daily. 90 tablet 1  . hyoscyamine (LEVSIN/SL) 0.125 MG SL tablet Place 1 tablet (0.125 mg total) under the tongue every 6 (six) hours as needed for cramping. 60 tablet 3  . Linaclotide (LINZESS) 145 MCG CAPS capsule Take 1 capsule (145 mcg total) by mouth daily. 30 capsule 4  . ondansetron (ZOFRAN ODT) 8 MG disintegrating tablet Take 1 tablet (8 mg total) by mouth every 8 (eight) hours as needed for nausea or vomiting. 20 tablet 0  . SUMAtriptan (IMITREX) 100 MG tablet One now with migraine May repeat in 2 hours if headache persists or recurs. 10 tablet 2  . traZODone (DESYREL) 50 MG tablet One po qhs 90 tablet 1   No current facility-administered medications for this visit.    Allergies as of 03/04/2016 - Review Complete 03/04/2016  Allergen Reaction Noted  . Doxycycline  04/29/2013  . Latex Other (See Comments) 04/21/2012    Vitals: BP 126/74 mmHg  Pulse 91  Ht 5' (1.524 m)  LMP 02/09/2016 Last Weight:  Wt Readings from Last 1 Encounters:  02/24/16 137 lb (62.143 kg)   Last Height:   Ht Readings from Last 1 Encounters:  03/04/16 5' (1.524 m)   Physical exam: Exam: Gen: anxious, crying                     CV: RRR, no MRG. No Carotid Bruits. No peripheral edema, warm, nontender Eyes: Conjunctivae clear without exudates or hemorrhage  Neuro: Detailed Neurologic Exam  Speech:    Speech is normal; fluent and  spontaneous with normal comprehension.  Cognition:    The patient is oriented to person, place, and time;     recent and remote memory intact;     language fluent;     normal attention, concentration,     fund of knowledge Cranial Nerves:    The pupils are equal, round, and reactive to light. The fundi are normal and spontaneous venous pulsations are present. Visual fields are full to finger confrontation. Extraocular movements are intact. Trigeminal sensation is intact and the muscles of mastication are normal. Left ptosis otherwise the face is symmetric. The palate elevates in the midline. Hearing intact. Voice is normal. Shoulder shrug is normal. The tongue  has normal motion without fasciculations.   Coordination:    Normal finger to nose and heel to shin. Normal rapid alternating movements.   Gait:    Heel-toe and tandem gait are normal.   Motor Observation:    No asymmetry, no atrophy, and no involuntary movements noted. Tone:    Normal muscle tone.    Posture:    Posture is normal. normal erect    Strength:    Strength is V/V in the upper and lower limbs.      Sensation: intact to LT     Reflex Exam:  DTR's:    Deep tendon reflexes in the upper and lower extremities are normal bilaterally.   Toes:    The toes are downgoing bilaterally.   Clonus:    Clonus is absent.   Assessment/Plan:   30 year old female with onset of migrainous headaches. She is crying and anxious in the office today, there maybe some recent psychological stressors in her life. Likely migraines. Imitrex did not help. Exam non focal except left ptosis however this may be chronic per patient.   - I recommend MRI but she says that she can't afford MRI. Then recommend CT of the head. Also would like CTA of the head and neck given the radiation of her pain into the left side of the neck and left ptosis. She said she doesn't have the money and prefers to hold off, I highly encouraged. I will order and I  told her to think about it. We discussed the risks of intracranial causes of her headaches or of a carotid dissection which could cause severe morbidity such as stroke. Should not get the CT of pregnant, discussed.  - Retake pregnancy test in one week if test today is pregnant and no menses, see OBGYN. - She is not sure if she is pregnant. Missed her period, overdue 4 days. She has done 2 pregnancy tests both negative. Will repeat in the office today and if negative again I feel safe she can start migraine preventative medication only if she is right about the timing of her menses. Discussed.  -  Suggest labetalol for migraine prevention, generally pregnancy compatible but still has risks. She prefers Topamax because it helps lose weight and weight gain is not a side effect. She would like Topamax despite the teratogenic risk and despite the side of effect of possibly worsening depression; she does not want to gain weight and so prefers Topamax. Can start low dose and slowly titrate to 100mg  daily if not pregnant. - imitrex contraindicated in pregnancy. - Reglan and tylenol at onset of headache, both considered compatible with pregnancy   Trokendi(Topiramate ER): Do not get pregnant, teratogenic. Discussed side effects. Serious side effects can inclue: Abdominal or stomach pain ,fever, chills, or sore throat , lessening of sensations or perception ,loss of appetite , mood or mental changes, including aggression, agitation, apathy, irritability, and mental depression , red, irritated, or bleeding gums , weight loss. Rare Blood in the urine , decrease in sexual performance or desire , difficult or painful urination , frequent urination , hearing loss , loss of bladder control , lower back or side pain , nosebleeds , pale skin , red or irritated eyes , ringing or buzzing in the ears , skin rash or itching , swelling , trouble breathing, Common side effects of Topamax include:  tiredness, drowsiness, dizziness,  nervousness, numbness or tingly feeling, coordination problems, diarrhea, weight loss, speech/language problems, changes in vision, sensory  distortion, loss of appetite, bad taste in your mouth, confusion, slowed thinking, trouble concentrating or paying attention, memory problems,   To prevent or relieve headaches, try the following: Cool Compress. Lie down and place a cool compress on your head.  Avoid headache triggers. If certain foods or odors seem to have triggered your migraines in the past, avoid them. A headache diary might help you identify triggers.  Include physical activity in your daily routine. Try a daily walk or other moderate aerobic exercise.  Manage stress. Find healthy ways to cope with the stressors, such as delegating tasks on your to-do list.  Practice relaxation techniques. Try deep breathing, yoga, massage and visualization.  Eat regularly. Eating regularly scheduled meals and maintaining a healthy diet might help prevent headaches. Also, drink plenty of fluids.  Follow a regular sleep schedule. Sleep deprivation might contribute to headaches Consider biofeedback. With this mind-body technique, you learn to control certain bodily functions - such as muscle tension, heart rate and blood pressure - to prevent headaches or reduce headache pain.    Proceed to emergency room if you experience new or worsening symptoms or symptoms do not resolve, if you have new neurologic symptoms or if headache is severe, or for any concerning symptom.      Cristina Ill, MD  [facility] [address] [place], [place] [address]  Phone [phone] Fax [phone]

## 2016-03-04 NOTE — Patient Instructions (Addendum)
Remember to drink plenty of fluid, eat healthy meals and do not skip any meals. Try to eat protein with a every meal and eat a healthy snack such as fruit or nuts in between meals. Try to keep a regular sleep-wake schedule and try to exercise daily, particularly in the form of walking, 20-30 minutes a day, if you can.   As far as your medications are concerned, I would like to suggest: Reglan at onset of headache (may take with imitrex and tylenol). Trokendi titration up to 100mg . Do not get pregnant or take Trokendi/imitrex when pregnant.   As far as diagnostic testing: CT of the head and CTA of the neck   Our phone number is [phone]. We also have an after hours call service for urgent matters and there is a physician on-call for urgent questions. For any emergencies you know to call 911 or go to the nearest emergency room

## 2016-03-05 LAB — COMPREHENSIVE METABOLIC PANEL
ALT: 14 IU/L (ref 0–32)
AST: 20 IU/L (ref 0–40)
Albumin/Globulin Ratio: 2.1 (ref 1.2–2.2)
Albumin: 4.9 g/dL (ref 3.5–5.5)
Alkaline Phosphatase: 53 IU/L (ref 39–117)
BUN/Creatinine Ratio: 9 (ref 9–23)
BUN: 7 mg/dL (ref 6–20)
Bilirubin Total: 0.5 mg/dL (ref 0.0–1.2)
CO2: 23 mmol/L (ref 18–29)
Calcium: 9.8 mg/dL (ref 8.7–10.2)
Chloride: 99 mmol/L (ref 96–106)
Creatinine, Ser: 0.78 mg/dL (ref 0.57–1.00)
GFR calc Af Amer: 120 mL/min/{1.73_m2} (ref >59)
GFR calc non Af Amer: 105 mL/min/{1.73_m2} (ref >59)
Globulin, Total: 2.3 g/dL (ref 1.5–4.5)
Glucose: 83 mg/dL (ref 65–99)
Potassium: 4.7 mmol/L (ref 3.5–5.2)
Sodium: 140 mmol/L (ref 134–144)
Total Protein: 7.2 g/dL (ref 6.0–8.5)

## 2016-03-05 LAB — HCG, SERUM, QUALITATIVE: hCG,Beta Subunit,Qual,Serum: NEGATIVE m[IU]/mL (ref <6)

## 2016-03-07 ENCOUNTER — Telehealth: Payer: Self-pay | Admitting: *Deleted

## 2016-03-07 NOTE — Telephone Encounter (Signed)
Called and LVM for pt about normal labs and negative pregnancy test. She can start trokendi per Dr Jaynee Eagles last office note. Gave [facility] phone number if she has further questions.

## 2016-03-07 NOTE — Telephone Encounter (Signed)
-----   Message from Melvenia Beam, MD sent at 03/05/2016 12:23 PM EDT ----- Labs normal and pregnancy test negative thanks

## 2016-03-12 ENCOUNTER — Encounter: Payer: Self-pay | Admitting: Neurology

## 2016-03-12 DIAGNOSIS — G43909 Migraine, unspecified, not intractable, without status migrainosus: Secondary | ICD-10-CM | POA: Insufficient documentation

## 2016-03-14 ENCOUNTER — Encounter: Payer: Self-pay | Admitting: Gastroenterology

## 2016-03-14 ENCOUNTER — Ambulatory Visit (INDEPENDENT_AMBULATORY_CARE_PROVIDER_SITE_OTHER): Payer: 59 | Admitting: Gastroenterology

## 2016-03-14 VITALS — BP 121/71 | HR 106 | Temp 98.6°F | Ht 61.0 in | Wt 135.4 lb

## 2016-03-14 DIAGNOSIS — K5900 Constipation, unspecified: Secondary | ICD-10-CM | POA: Insufficient documentation

## 2016-03-14 DIAGNOSIS — R140 Abdominal distension (gaseous): Secondary | ICD-10-CM | POA: Diagnosis not present

## 2016-03-14 DIAGNOSIS — R1013 Epigastric pain: Secondary | ICD-10-CM | POA: Diagnosis not present

## 2016-03-14 MED ORDER — PLECANATIDE 3 MG PO TABS: 3.0000 mg | ORAL_TABLET | Freq: Every day | ORAL | Status: DC

## 2016-03-14 MED ORDER — ALIGN 4 MG PO CAPS: 4.0000 mg | ORAL_CAPSULE | Freq: Every day | ORAL | Status: DC

## 2016-03-14 NOTE — Patient Instructions (Signed)
1. Stop Linzess. Start Trulance for constipation. One daily. Samples provided. RX sent to your pharmacy. 2. Start probiotic, Align once daily. Samples provided. 3. Please have your labs done.  4. Avoid dairy products for two weeks.  5. Call in two weeks with progress report. If no significant improvement, then we will plan on further work up.

## 2016-03-14 NOTE — Progress Notes (Signed)
Primary Care Physician: Sallee Lange, MD  Primary Gastroenterologist:  Garfield Cornea, MD   Chief Complaint  Patient presents with  . Nausea  . Bloated  . Constipation    HPI: Cristina Davenport is a 30 y.o. female here For further evaluation of nausea, bloating, constipation. We last saw patient 2014 prior to her cholecystectomy. Patient has IBS at baseline. States that these symptoms are different than her typical IBS.  Severe nausea for several months. Happens with smells. She does have a history of migraines. States she has good days and bad days but the bad days with good. Feels foggy. Recently prescribed Linzess for constipation but states she cannot tolerate it. She will pass straight liquid stools for hours after taking it. "Cannot live that way". States she usually eats very healthy. She will take a stool softener laxative if she has no bowel movement within a couple of days. Has went 2 weeks without a stool before. Bloating and discomfort worse with meals. Does not really feel significant improvement even after she's had adequate bowel movements. Recently given Levsin a few weeks ago but does not take it. Remains active. Works out daily. Consumes a lot of vegetables and water. Intentional weight loss of 25 pounds.  Feels miserable when she eats ice cream. Causes constipation. Complains of severe bloating, feels like she can't put another bite of food in her she'll vomit. Failed over-the-counter PPIs. Avoids NSAIDs and aspirin. Notes she has regular menses since her IUD was removed last year. Multiple negative pregnancy test recently.     Current Outpatient Prescriptions  Medication Sig Dispense Refill  . clonazePAM (KLONOPIN) 0.5 MG tablet 1 bid prn 40 tablet 4  . escitalopram (LEXAPRO) 20 MG tablet Take 1 tablet (20 mg total) by mouth daily. 90 tablet 1  . hyoscyamine (LEVSIN/SL) 0.125 MG SL tablet Place 1 tablet (0.125 mg total) under the tongue every 6 (six) hours as needed  for cramping. 60 tablet 3  . Linaclotide (LINZESS) 145 MCG CAPS capsule Take 1 capsule (145 mcg total) by mouth daily. 30 capsule 4  . metoCLOPramide (REGLAN) 10 MG tablet Take at the onset of headache. May take with tylenol and imitrex as well. 90 tablet 12  . SUMAtriptan (IMITREX) 100 MG tablet One now with migraine May repeat in 2 hours if headache persists or recurs. 10 tablet 2  . Topiramate ER (TROKENDI XR) 100 MG CP24 Take 100 mg by mouth at bedtime. 30 capsule 12  . traZODone (DESYREL) 50 MG tablet One po qhs 90 tablet 1   No current facility-administered medications for this visit.    Allergies as of 03/14/2016 - Review Complete 03/14/2016  Allergen Reaction Noted  . Doxycycline  04/29/2013  . Latex Other (See Comments) 04/21/2012   Past Medical History  Diagnosis Date  . Anxiety   . Bipolar 1 disorder ([place])   . Anxiety   . GERD (gastroesophageal reflux disease)   . IBS (irritable bowel syndrome)   . Migraine without aura    Past Surgical History  Procedure Laterality Date  . Examination under anesthesia  04/21/2012    Gluteal wound repair  . Esophagogastroduodenoscopy N/A 04/30/2013    [id number] distal esophagus of uncertain clinical significance-status post biopsy. Tiny hiatal hernia. No explanation  . Abdominal surgery      cyst removed   . Cholecystectomy N/A 07/10/2013    Procedure: LAPAROSCOPIC CHOLECYSTECTOMY;  Surgeon: Donato Heinz, MD;  Location: [facility];  Service: General;  Laterality: N/A;   Family History  Problem Relation Age of Onset  . Hypertension Maternal Grandmother   . Stroke Maternal Grandmother   . Hypertension Maternal Grandfather   . Stroke Maternal Grandfather   . Crohn's disease Maternal Aunt   . Crohn's disease Cousin   . Colon cancer Neg Hx   . Mental illness Mother    Social History   Social History  . Marital Status: Divorced    Spouse Name: N/A  . Number of Children: 1  . Years of Education: 12   Occupational History  .  [employer]    Social History Main Topics  . Smoking status: Former Smoker -- 0.00 packs/day for 8 years    Quit date: 09/19/2014  . Smokeless tobacco: None  . Alcohol Use: 0.0 oz/week    0 Standard drinks or equivalent per week     Comment: occasionally,  social  . Drug Use: No  . Sexual Activity:    Partners: Male    Patent examiner Protection: None   Other Topics Concern  . None   Social History Narrative   Lives with 47 yr old daughter and finance   Caffeine use: none    ROS:  General: Negative for anorexia, Unintentional weight loss, fever, chills, fatigue, weakness. ENT: Negative for hoarseness, difficulty swallowing , nasal congestion. CV: Negative for chest pain, angina, palpitations, dyspnea on exertion, peripheral edema.  Respiratory: Negative for dyspnea at rest, dyspnea on exertion, cough, sputum, wheezing.  GI: See history of present illness. GU:  Negative for dysuria, hematuria, urinary incontinence, urinary frequency, nocturnal urination.  Endo: Negative for unusual weight change.    Physical Examination:   BP 121/71 mmHg  Pulse 106  Temp(Src) 98.6 F (37 C) (Oral)  Ht 5\' 1"  (1.549 m)  Wt 135 lb 6.4 oz (61.417 kg)  BMI 25.60 kg/m2  LMP 03/05/2016  General: Well-nourished, well-developed in no acute distress.  Eyes: No icterus. Mouth: Oropharyngeal mucosa moist and pink , no lesions erythema or exudate. Lungs: Clear to auscultation bilaterally.  Heart: Regular rate and rhythm, no murmurs rubs or gallops.  Abdomen: Bowel sounds are normal, vague minimal epigastric tenderness, nondistended, no hepatosplenomegaly or masses, no abdominal bruits or hernia , no rebound or guarding.   Extremities: No lower extremity edema. No clubbing or deformities. Neuro: Alert and oriented x 4   Skin: Warm and dry, no jaundice.   Psych: Alert and cooperative, normal mood and affect.  Labs:  Lab Results  Component Value Date   CREATININE 0.78 03/04/2016   BUN 7  03/04/2016   NA 140 03/04/2016   K 4.7 03/04/2016   CL 99 03/04/2016   CO2 23 03/04/2016   Lab Results  Component Value Date   ALT 14 03/04/2016   AST 20 03/04/2016   ALKPHOS 53 03/04/2016   BILITOT 0.5 03/04/2016   Lab Results  Component Value Date   TSH 0.805 11/04/2015    Imaging Studies: No results found.

## 2016-03-15 ENCOUNTER — Telehealth: Payer: Self-pay

## 2016-03-15 ENCOUNTER — Encounter: Payer: Self-pay | Admitting: Nurse Practitioner

## 2016-03-15 LAB — CBC WITH DIFFERENTIAL/PLATELET
Basophils Absolute: 0 10*3/uL (ref 0.0–0.2)
Basos: 0 %
EOS (ABSOLUTE): 0.3 10*3/uL (ref 0.0–0.4)
Eos: 4 %
Hematocrit: 41.1 % (ref 34.0–46.6)
Hemoglobin: 13.3 g/dL (ref 11.1–15.9)
Immature Grans (Abs): 0 10*3/uL (ref 0.0–0.1)
Immature Granulocytes: 0 %
Lymphocytes Absolute: 2.2 10*3/uL (ref 0.7–3.1)
Lymphs: 29 %
MCH: 29.8 pg (ref 26.6–33.0)
MCHC: 32.4 g/dL (ref 31.5–35.7)
MCV: 92 fL (ref 79–97)
Monocytes Absolute: 0.7 10*3/uL (ref 0.1–0.9)
Monocytes: 9 %
Neutrophils Absolute: 4.3 10*3/uL (ref 1.4–7.0)
Neutrophils: 58 %
Platelets: 268 10*3/uL (ref 150–379)
RBC: 4.46 x10E6/uL (ref 3.77–5.28)
RDW: 13.3 % (ref 12.3–15.4)
WBC: 7.6 10*3/uL (ref 3.4–10.8)

## 2016-03-15 LAB — SEDIMENTATION RATE: Sed Rate: 2 mm/hr (ref 0–32)

## 2016-03-15 LAB — TISSUE TRANSGLUTAMINASE, IGA: Transglutaminase IgA: <2 U/mL (ref 0–3)

## 2016-03-15 LAB — IGA: IgA/Immunoglobulin A, Serum: 159 mg/dL (ref 87–352)

## 2016-03-15 NOTE — Telephone Encounter (Signed)
Labs are back from [place] and are in [place] box

## 2016-03-16 NOTE — Progress Notes (Signed)
Quick Note:  Please let patient know that her celiac screen was negative. Her CBC including hemoglobin normal. Sedimentation rate looking for inflammation in the body was normal. Plan as outlined time of office visit. She needs to call in a couple weeks with a progress report. ______

## 2016-03-16 NOTE — Progress Notes (Signed)
cc'ed to pcp °

## 2016-03-16 NOTE — Assessment & Plan Note (Signed)
30 year old female with predominant symptoms of nausea, epigastric tenderness, abdominal bloating, constipation. Symptoms debilitating at times. Did not tolerate Linzess. No improvement with over-the-counter PPIs. Notes worsening symptoms with dairy, particularly ice cream. Recent LFTs, TSH, renal function normal. EGD in 2014 with reflux esophagitis.  Unclear how much her GI symptoms are related to her unmanaged constipation. Suspect element of lactose intolerance. We will add a probiotic, switch Linzess to Trulance '3mg'$  daily, avoid dairy for 2 weeks. Check CBC, ESR, celiac screen. Call with progress report in 2 weeks. If no significant improvement at that time, consider further workup.

## 2016-03-16 NOTE — Telephone Encounter (Signed)
noted 

## 2016-03-22 ENCOUNTER — Other Ambulatory Visit: Payer: 59

## 2016-03-24 ENCOUNTER — Encounter: Payer: Self-pay | Admitting: Gastroenterology

## 2016-03-24 ENCOUNTER — Telehealth: Payer: Self-pay | Admitting: Gastroenterology

## 2016-03-24 NOTE — Telephone Encounter (Signed)
Patient been taking medicine that was given to her and she has not gong to the bathroom since April..She is taking various herbal and vitamin products, Back is hurtning and her stomach is bulging.  [phone] cell   Work until [employer] [phone]

## 2016-03-24 NOTE — Telephone Encounter (Signed)
Routing to [place], this is RMR pt.

## 2016-03-25 ENCOUNTER — Telehealth: Payer: Self-pay | Admitting: Internal Medicine

## 2016-03-25 ENCOUNTER — Telehealth: Payer: Self-pay | Admitting: *Deleted

## 2016-03-25 ENCOUNTER — Telehealth: Payer: Self-pay | Admitting: Nurse Practitioner

## 2016-03-25 DIAGNOSIS — K5900 Constipation, unspecified: Secondary | ICD-10-CM | POA: Diagnosis not present

## 2016-03-25 DIAGNOSIS — R140 Abdominal distension (gaseous): Secondary | ICD-10-CM | POA: Diagnosis not present

## 2016-03-25 DIAGNOSIS — R634 Abnormal weight loss: Secondary | ICD-10-CM | POA: Diagnosis not present

## 2016-03-25 NOTE — Telephone Encounter (Signed)
Based on what we know, I feel would be best for the patient to come to the [facility] ED for further evaluation

## 2016-03-25 NOTE — Telephone Encounter (Signed)
Pt is aware to go to the ED.

## 2016-03-25 NOTE — Telephone Encounter (Addendum)
Agree with Dr. Roseanne Kaufman recommendations.   I did check and patient sent mychart messages at 9:47 and 10:47 yesterday morning. Clearly states on Mychart that this is to be used for "non-urgent message to your clinic. Expect a response within 2 business days." Otherwise, I have personally have not received any phone message until this morning's message from Dr. Gala Romney.

## 2016-03-25 NOTE — Telephone Encounter (Signed)
Tried to call pts cell this morning- NA- No voicemail Tried to call pts work number- had to leave a message

## 2016-03-25 NOTE — Telephone Encounter (Signed)
error 

## 2016-03-25 NOTE — Telephone Encounter (Signed)
Pt called back and left a voicemail. She said the enema helped her a little bit but not much but now she is in more pain than she was before and she wants to know what she should do?

## 2016-03-25 NOTE — Telephone Encounter (Signed)
See other phone note

## 2016-03-25 NOTE — Telephone Encounter (Signed)
Patient called with c/o severe constipation: Consult with Dr Steve-Dr Richardson Landry stated patient can try 30cc of MOM now and repeat in 6 hrs, one fleets enema and 3 OTC dulcolax tablets today-then miralax one scoop twice a day for 3 days-then daily starting tomorrow. ER if abdominal pain or sx worsen. Doctors recommendations discussed with patient who verbalized understanding.

## 2016-03-25 NOTE — Telephone Encounter (Signed)
Patient called last night. No bowel movement since the end of last month. Seen in our office earlier this month. Prescribed Trulance and stool softer. No fever or severe abd  pain. Works at [employer]. 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 She took a picture of it and was sending it to our office for review. She states she contacted [place] via  My cchart and left  a voicemail yesterday.She states no one responded to her needs.. I told her if she felt she was in extreme distress to go to the ED department otherwise, take 17 g of MiraLax right away and try a fleets enema. We will make contact with her in the morning.Marland Kitchen

## 2016-03-25 NOTE — Telephone Encounter (Signed)
Tried to call pts cell- NA, no voicemail Tried to call pts work number- had to leave a message

## 2016-03-25 NOTE — Telephone Encounter (Signed)
Pt called this morning saying that she had talked with RMR last night and she said that she did what he told her to do and it has not helped any. I told her that he and his nurse were not available at the moment, but I would transfer her call to JL and see what recommendations she and RMR would have for her.

## 2016-03-25 NOTE — Telephone Encounter (Signed)
good

## 2016-03-25 NOTE — Telephone Encounter (Signed)
Communication noted.  

## 2016-03-28 ENCOUNTER — Other Ambulatory Visit: Payer: Self-pay | Admitting: Sports Medicine

## 2016-03-28 ENCOUNTER — Ambulatory Visit
Admission: RE | Admit: 2016-03-28 | Discharge: 2016-03-28 | Disposition: A | Discharge status: Home / Self Care | Payer: 59 | Source: Ambulatory Visit | Attending: Sports Medicine | Admitting: Sports Medicine

## 2016-03-28 DIAGNOSIS — R1084 Generalized abdominal pain: Secondary | ICD-10-CM

## 2016-03-28 DIAGNOSIS — R109 Unspecified abdominal pain: Secondary | ICD-10-CM | POA: Diagnosis not present

## 2016-03-28 DIAGNOSIS — K5909 Other constipation: Secondary | ICD-10-CM

## 2016-03-28 IMAGING — CT CT ABD-PELV W/ CM
2 of 4 series · 16 of 46 positions shown, 18 images · IV contrast (APPLIED)
Comparison: CT abdomen pelvis dated [DATE]

CLINICAL DATA: Abdominal pain, prior cholecystectomy, mid abdominal
bulge

EXAM:
CT ABDOMEN AND PELVIS WITH CONTRAST
TECHNIQUE: Multidetector CT imaging of the abdomen and pelvis was performed
using the standard protocol following bolus administration of
intravenous contrast.
CONTRAST:  100mL [Q9] IOPAMIDOL ([Q9]) INJECTION 61%

[Series 2: abd/pelvis w/cm · axial · 0.60mm/px · z∈[+792,+1182]mm · 13 of 86 slices shown, 15 images]
[im 4/86  soft-tissue]
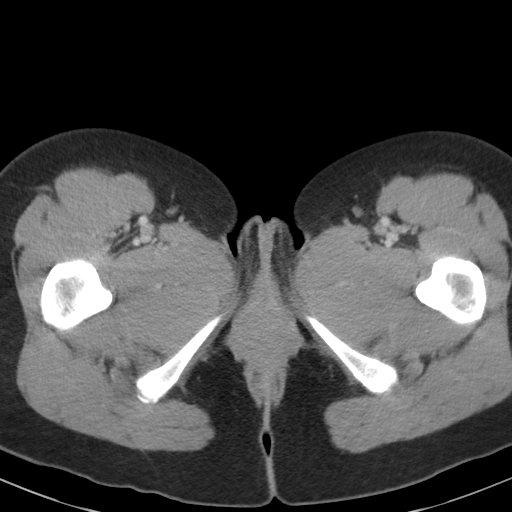
[im 4/86  bone]
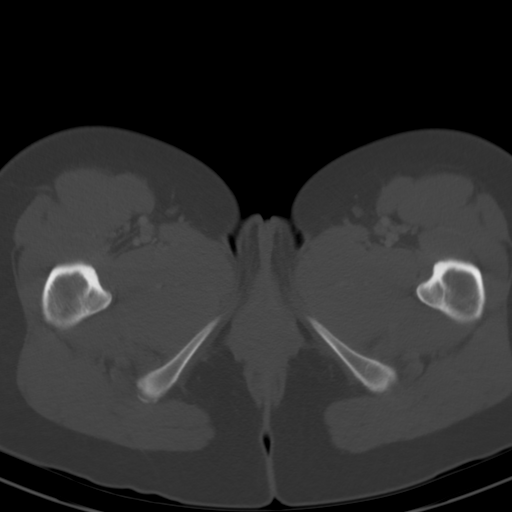
[im 11/86  soft-tissue]
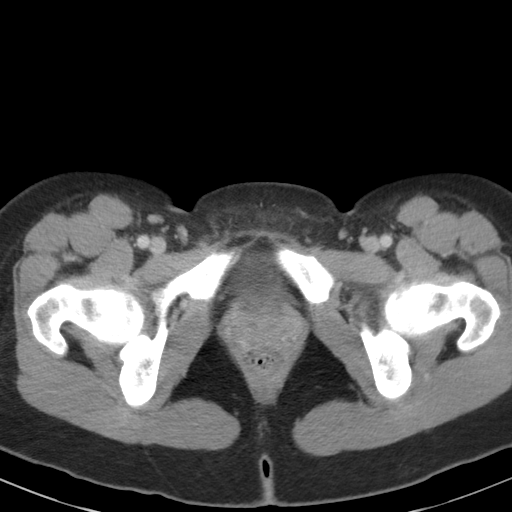
[im 18/86  soft-tissue]
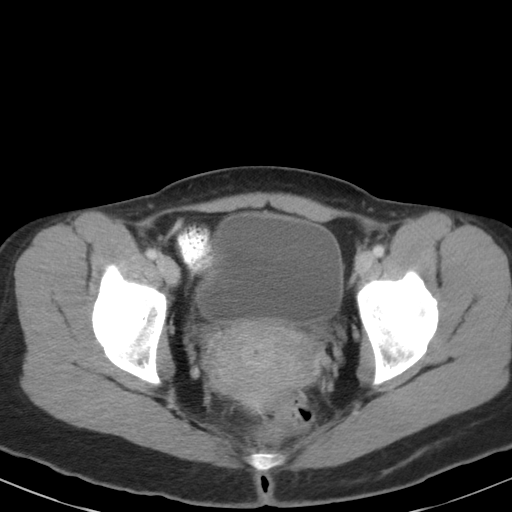
[im 25/86  soft-tissue]
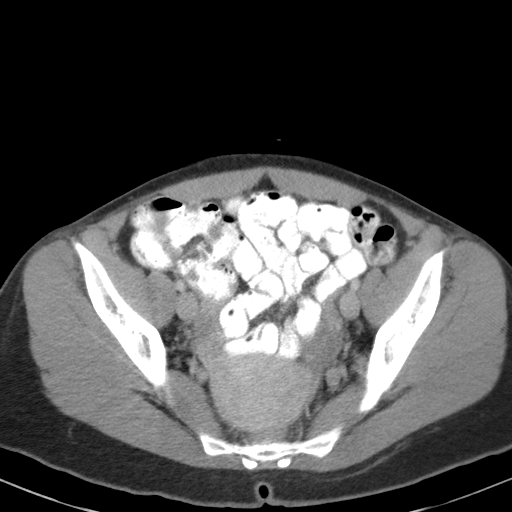
[im 29/86  soft-tissue]
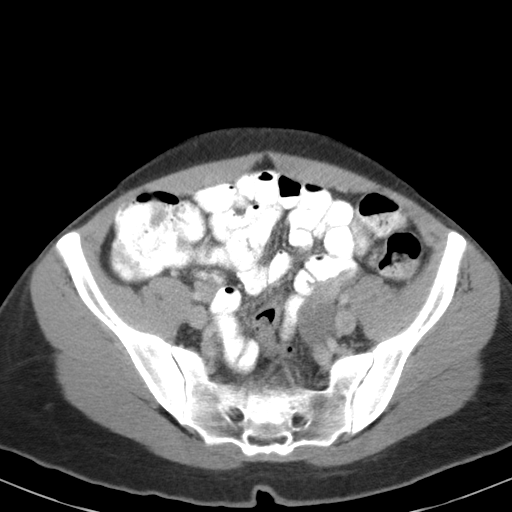
[im 36/86  soft-tissue]
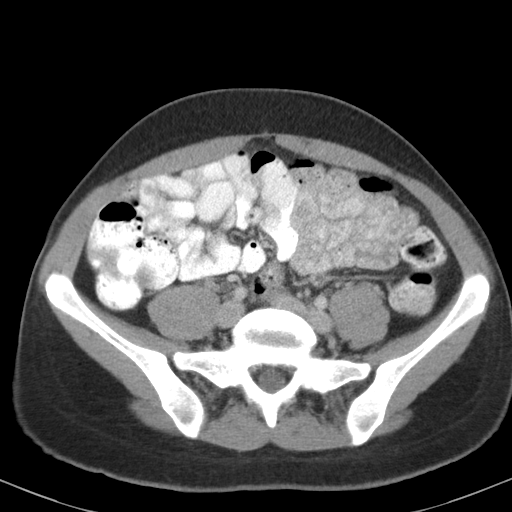
[im 43/86  soft-tissue]
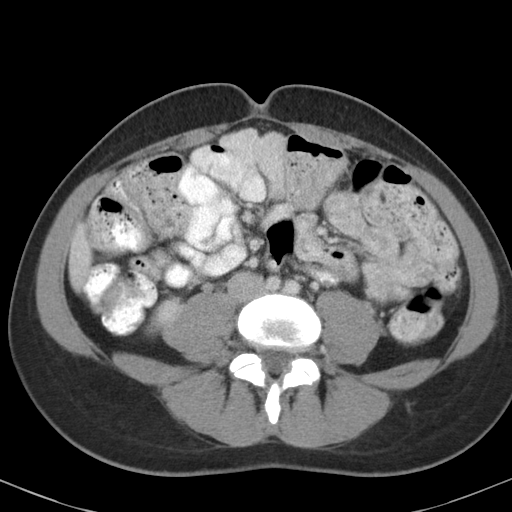
[im 50/86  soft-tissue]
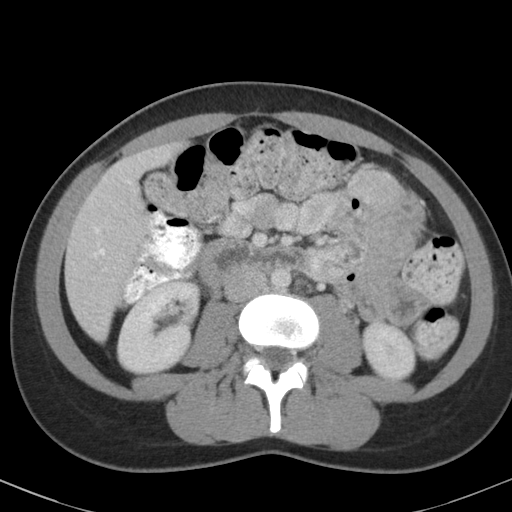
[im 57/86  soft-tissue]
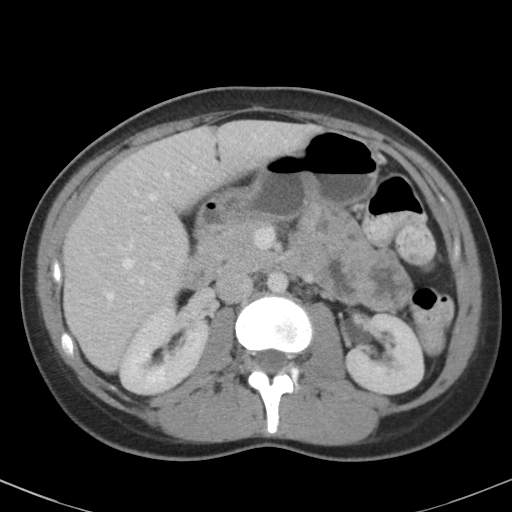
[im 57/86  bone]
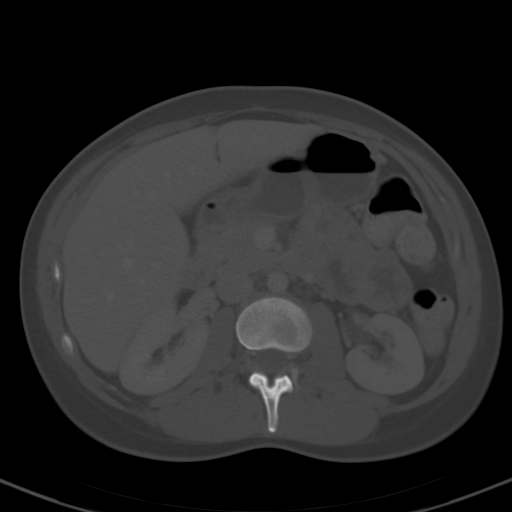
[im 61/86  soft-tissue]
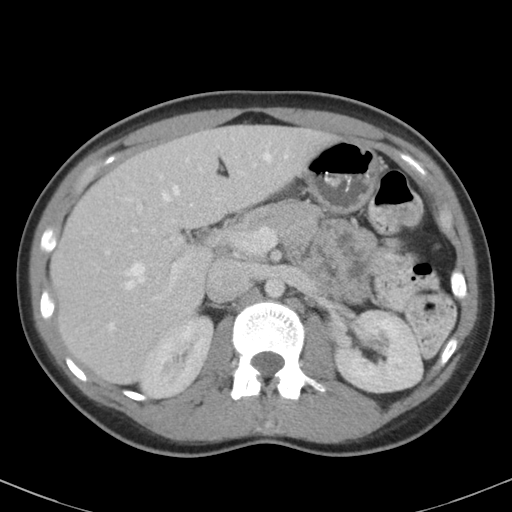
[im 68/86  soft-tissue]
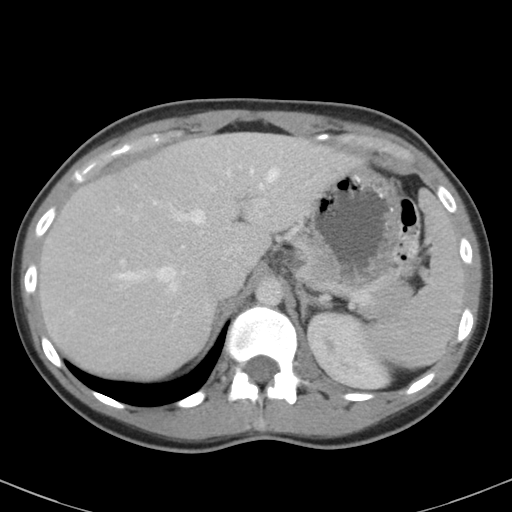
[im 75/86  soft-tissue]
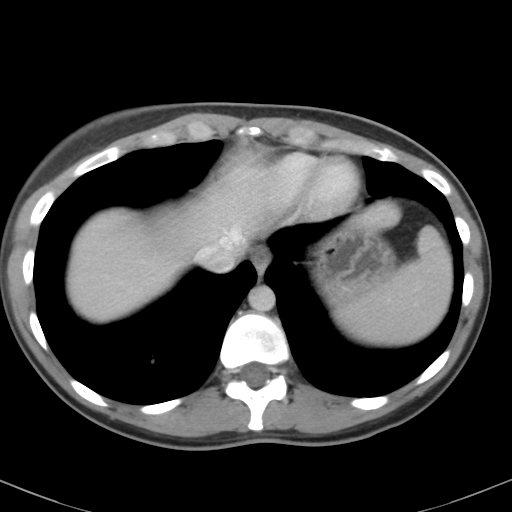
[im 82/86  soft-tissue]
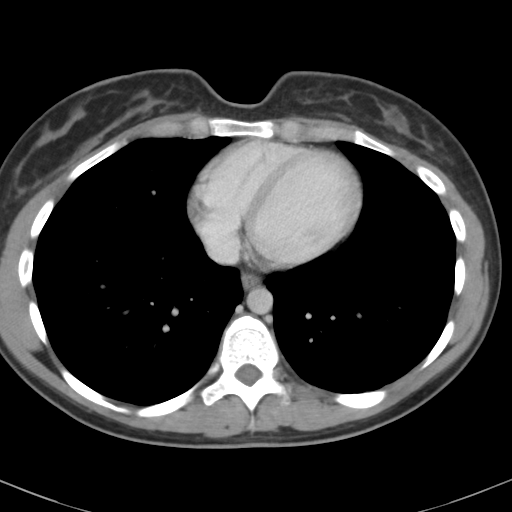

[Series 3: cor · coronal · 0.62mm/px · 3 of 76 slices shown]
[im 26/76  soft-tissue]
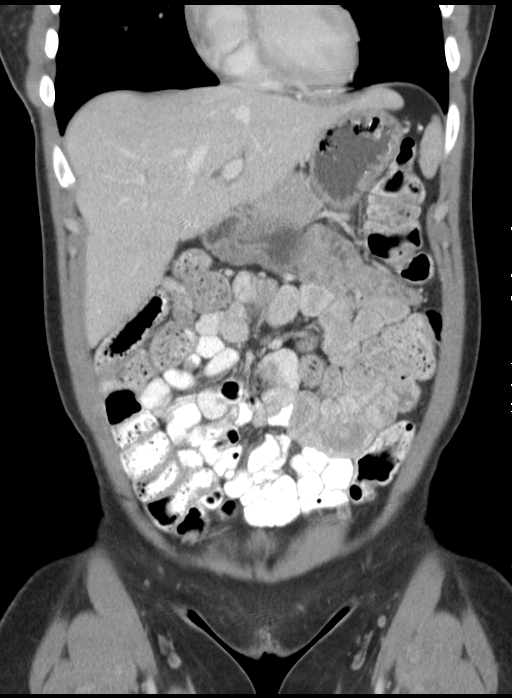
[im 34/76  soft-tissue]
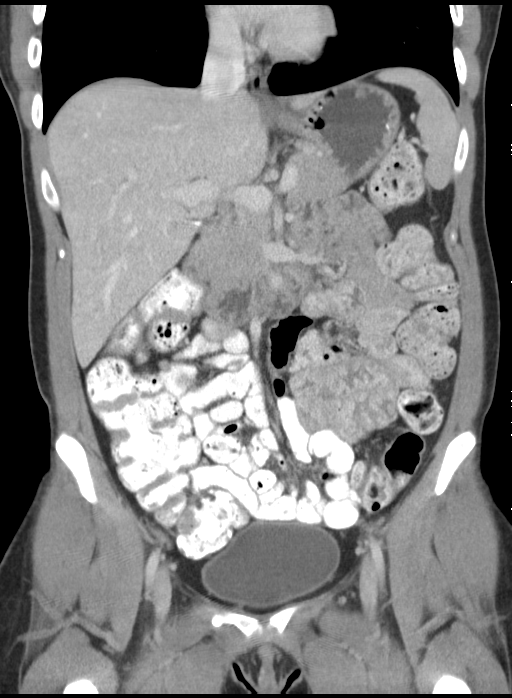
[im 42/76  soft-tissue]
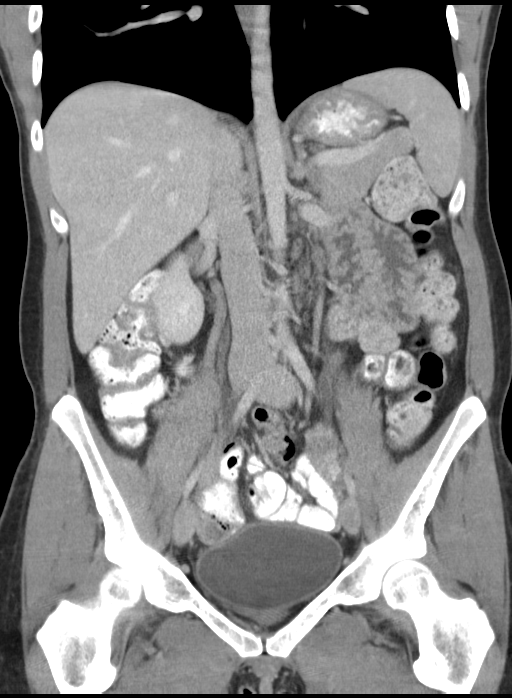

[16 of 46 positions shown; findings below may reference images not displayed]

FINDINGS: Lower chest:  Lung bases are clear.

Hepatobiliary: Liver is within normal limits.

Status post cholecystectomy. No intrahepatic or extrahepatic ductal
dilatation.

Pancreas: Within normal limits.

Spleen: Within normal limits.

Adrenals/Urinary Tract: Adrenal glands are within normal limits.

Kidneys are within normal limits.  No hydronephrosis.

Bladder is within normal limits.

Stomach/Bowel: Stomach is within normal limits.

No evidence of bowel obstruction.

Normal appendix (series 2/ image 56).

Vascular/Lymphatic: No evidence of abdominal aortic aneurysm.

No suspicious abdominopelvic lymphadenopathy.

Reproductive: Retroverted uterus is within normal limits.

Bilateral ovaries are within normal limits.

Other: No abdominopelvic ascites.

Musculoskeletal: Soft tissue marker marking the area of the
patient's clinical concern along the right upper abdominal wall
(series 2/ image 21). No underlying CT abnormality. No soft tissue
mass in the anterior abdominal wall. No evidence of ventral hernia.

Visualized osseous structures are within normal limits.
IMPRESSION: No CT abnormality in the anterior abdominal wall to correspond to
the area of the patient's clinical concern.

Status post cholecystectomy.

Otherwise negative CT abdomen/pelvis.

## 2016-03-28 MED ORDER — IOPAMIDOL (ISOVUE-300) INJECTION 61%
100.0000 mL | Freq: Once | INTRAVENOUS | Status: AC | PRN
  Administered 2016-03-28: 100 mL via INTRAVENOUS

## 2016-03-29 ENCOUNTER — Telehealth: Payer: Self-pay | Admitting: Internal Medicine

## 2016-03-29 ENCOUNTER — Telehealth: Payer: Self-pay

## 2016-03-29 NOTE — Telephone Encounter (Signed)
Pt called- she did not go to the ED because she said she was not going to take her 30 year old with her to the ED.  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 So she said she didn't take them and just suffered all weekend. Another radiologist at her job ordered a ct (results are in epic), he told her he didn't see anything but a lot of stool and gave her a tcs prep to take today. She started it at 5am and has had multiple bm's but she doesn't feel like she is getting cleaned out. She said the stool is watery and yellow now, but she is still bloated and feels nauseous. No fever. She wants to know if there is anything we can do to help her. She is wanting either LSL or RMR to look at her CT results as well.

## 2016-03-29 NOTE — Telephone Encounter (Signed)
Patient is scheduled for 5/17 at 1130 to see LL and she is aware of the appt

## 2016-03-29 NOTE — Telephone Encounter (Signed)
Cristina Davenport spoke with the pt.

## 2016-03-29 NOTE — Telephone Encounter (Signed)
I reviewed CT findings. Unremarkable study.   Can we find out what bowel prep she took today? Start Amitiza 76mcg BID , #60 , 5 refills. Take with food.  OV with RMR only in next 1-2 weeks.

## 2016-03-29 NOTE — Telephone Encounter (Signed)
CALLED PATIENT TO MAKE URGENT APPT THIS WEEK AND SHE SAID SHE HAS TO WORK, CAN SOMETHING JUST BE CALLED IN? WANTED TO KNOW WHY SHE NEEDED AN APPOINTMENT AND I TOLD HER THAT RMR RECOMMENDED IT. WANTS JULIE TO CALL HER.

## 2016-03-29 NOTE — Telephone Encounter (Signed)
Patient not following my instructions. Seeing multiple providers and getting a multitude of different recommendations. Imaging ordered elsewhere. Saw another gastroenterologist today.  No further over the telephone recommendations from this office to be given. She needs a face-to-face encounter in the next 1-2 days.

## 2016-03-30 ENCOUNTER — Ambulatory Visit (INDEPENDENT_AMBULATORY_CARE_PROVIDER_SITE_OTHER): Payer: 59 | Admitting: Gastroenterology

## 2016-03-30 ENCOUNTER — Ambulatory Visit (HOSPITAL_COMMUNITY)
Admission: RE | Admit: 2016-03-30 | Discharge: 2016-03-30 | Disposition: A | Discharge status: Home / Self Care | Payer: 59 | Source: Ambulatory Visit | Attending: Gastroenterology | Admitting: Gastroenterology

## 2016-03-30 ENCOUNTER — Encounter: Payer: Self-pay | Admitting: Gastroenterology

## 2016-03-30 VITALS — BP 103/61 | HR 87 | Temp 98.5°F | Ht 61.0 in | Wt 136.4 lb

## 2016-03-30 DIAGNOSIS — K5900 Constipation, unspecified: Secondary | ICD-10-CM | POA: Diagnosis not present

## 2016-03-30 DIAGNOSIS — R194 Change in bowel habit: Secondary | ICD-10-CM | POA: Diagnosis not present

## 2016-03-30 DIAGNOSIS — R140 Abdominal distension (gaseous): Secondary | ICD-10-CM | POA: Diagnosis not present

## 2016-03-30 IMAGING — DX DG ABDOMEN 2V
2 series · 2 of 2 positions shown · non-contrast
Comparison: CT [DATE].

CLINICAL DATA: Abdominal pain and distention.

EXAM:
ABDOMEN - 2 VIEW

[abdomen erect]
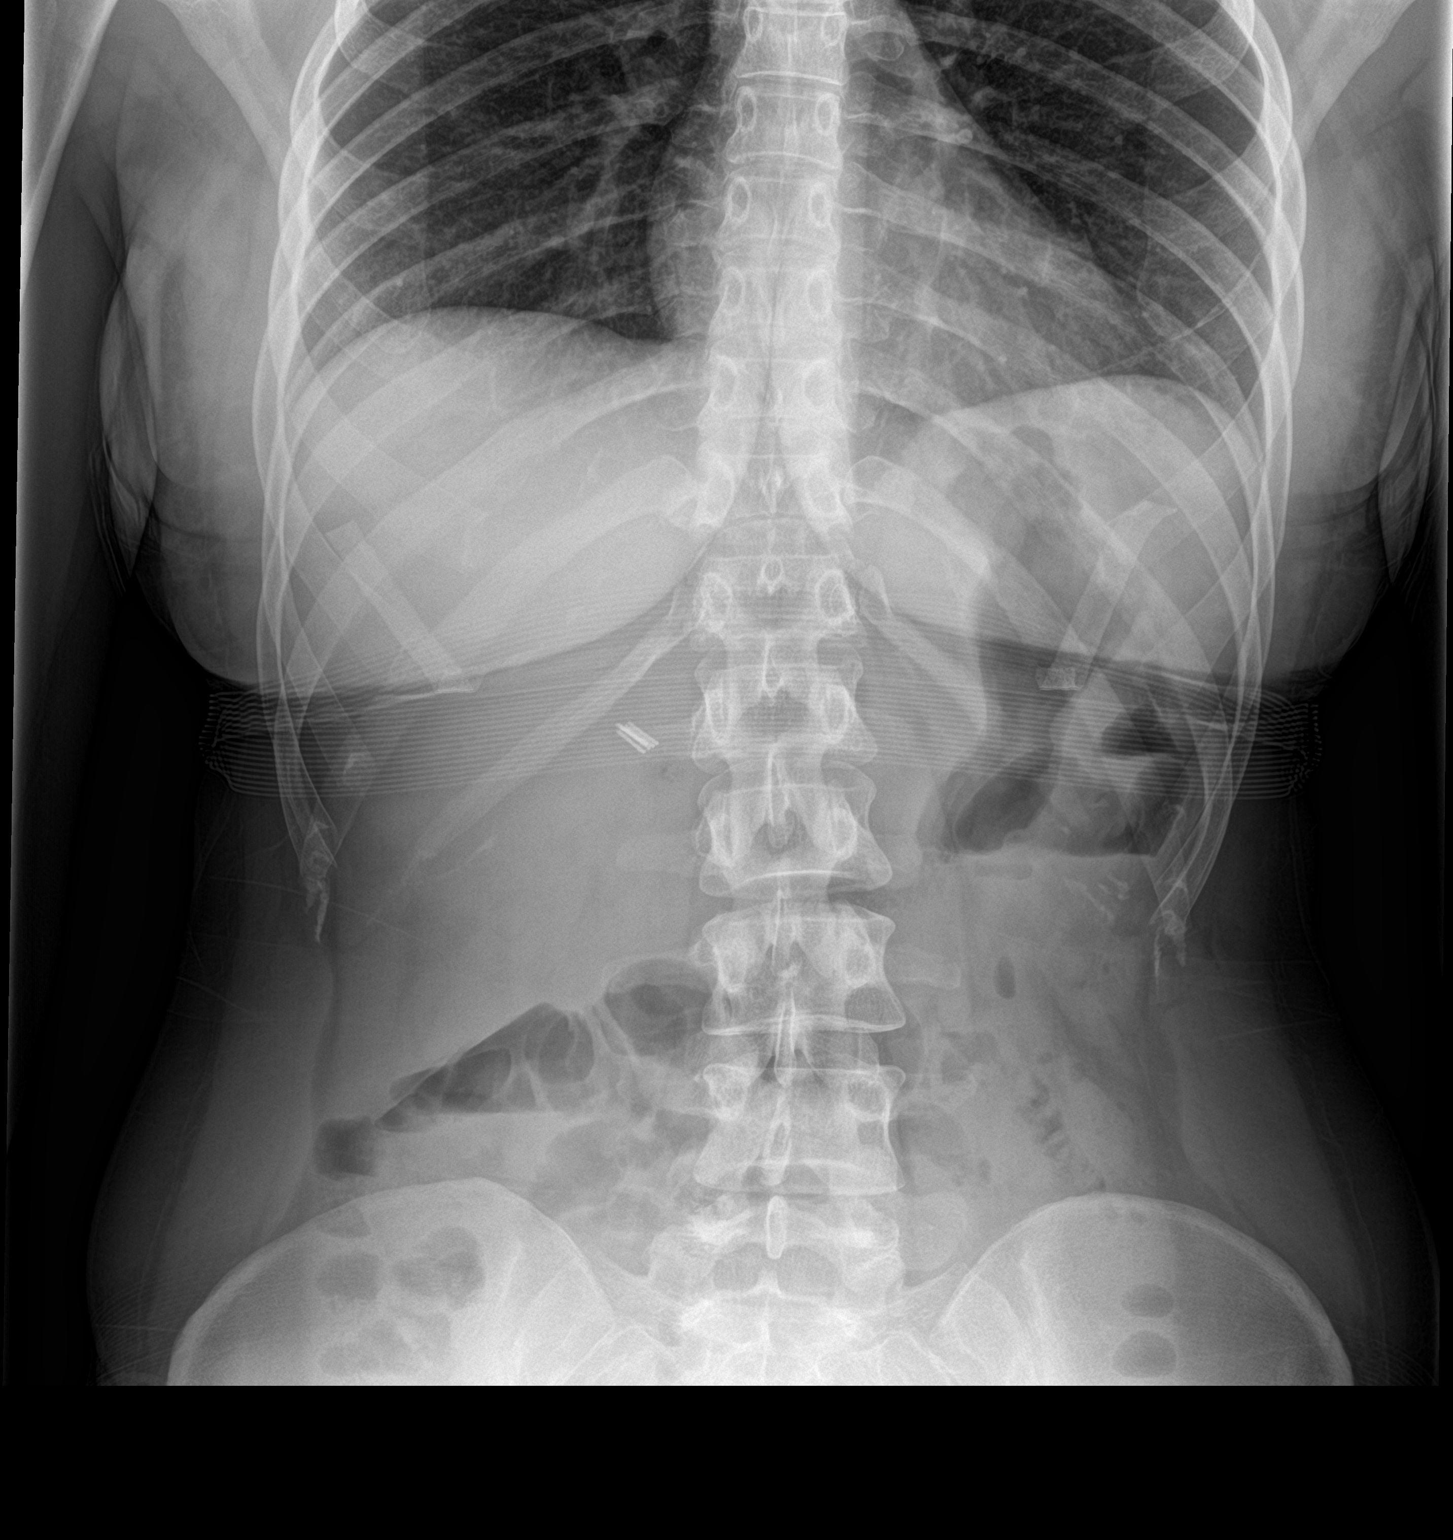

[abdomen supine]
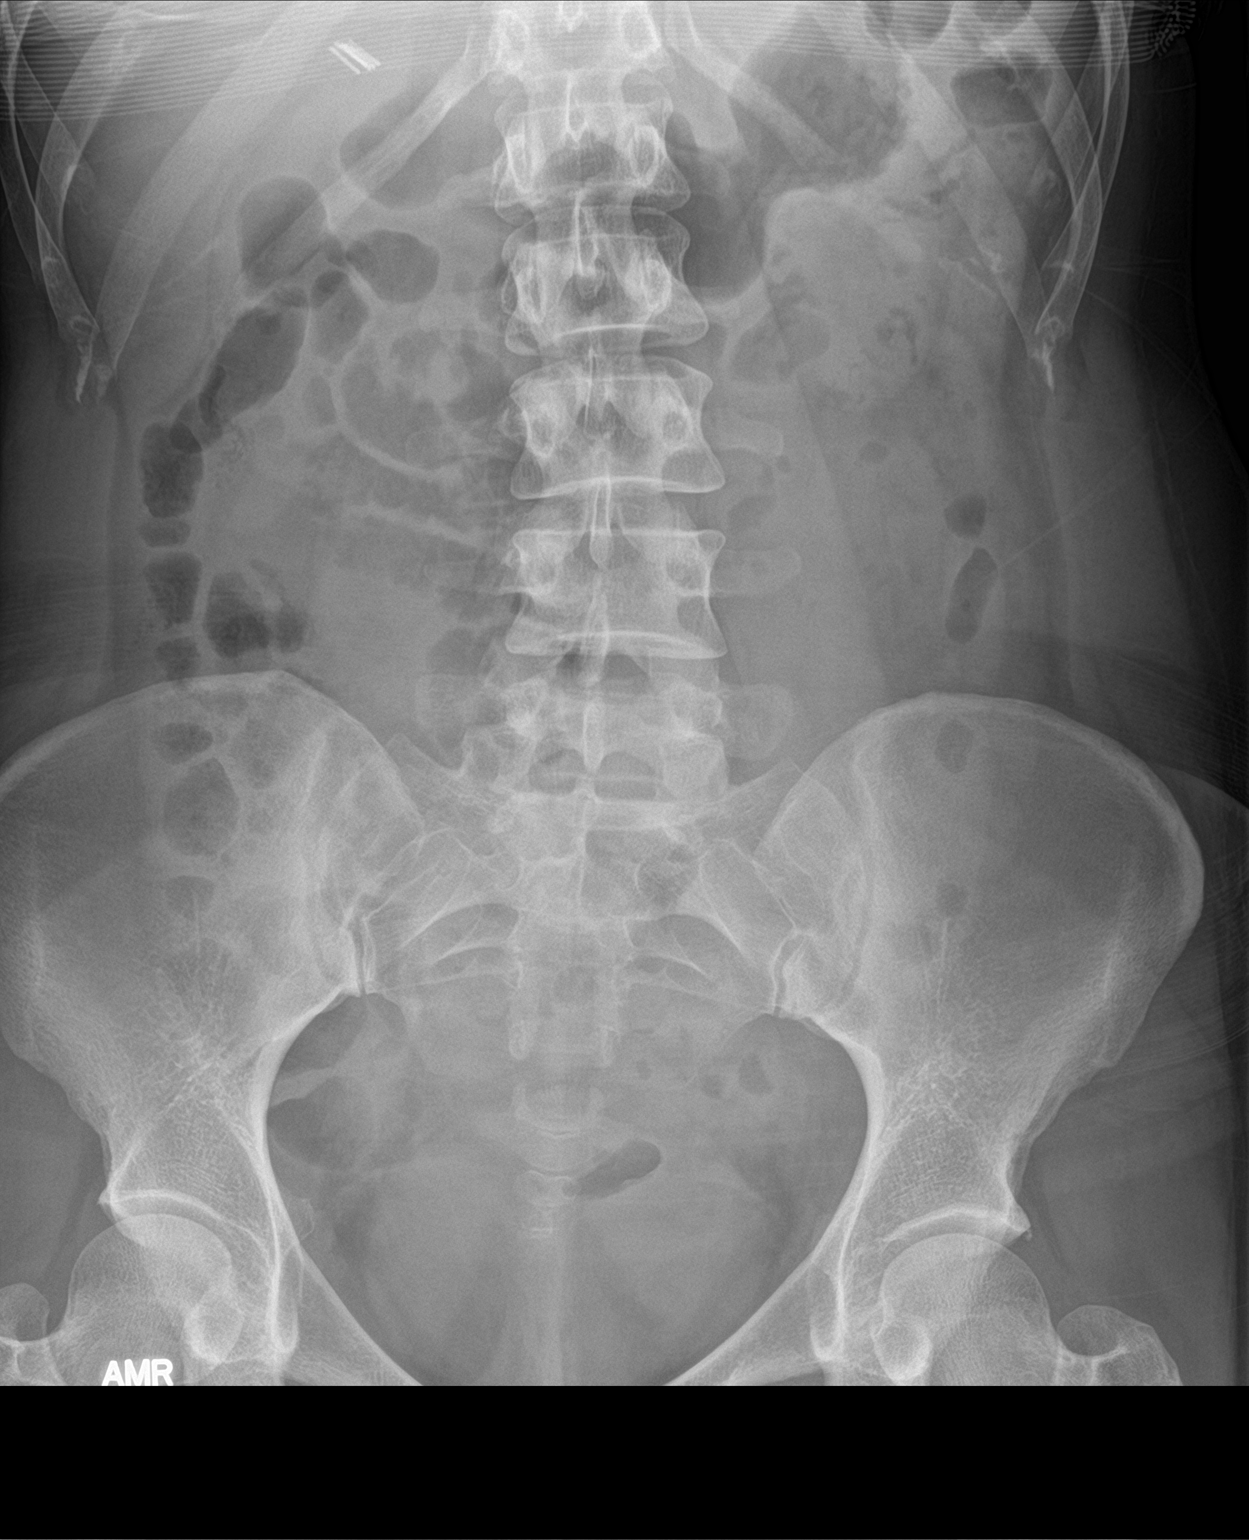

[2 of 2 positions shown; findings below may reference images not displayed]

FINDINGS: Surgical clips right upper quadrant. Soft tissue structures are
unremarkable. Several air-filled loops of small bowel are noted.
Colonic gas pattern is is normal. Follow-up abdominal series
suggested to exclude developing adynamic ileus or bowel obstruction.
No free air. No pathologic intra-abdominal calcification. Linear
densities are noted over the upper quadrants bilaterally. These
could represent bandage outside of the patient. Clinical correlation
suggested. No acute bony abnormality.
IMPRESSION: 1. Several nondilated loops of small bowel are noted. Colonic gas
pattern is nonspecific. Follow-up abdominal series suggested to
exclude developing adynamic ileus or bowel obstruction.

2. Linear opacities noted over the left upper quadrant. These could
represent bandages outside of the patient. Clinical correlation
suggested.

## 2016-03-30 MED ORDER — LUBIPROSTONE 24 MCG PO CAPS: 24.0000 ug | ORAL_CAPSULE | Freq: Two times a day (BID) | ORAL | Status: DC

## 2016-03-30 NOTE — Assessment & Plan Note (Signed)
30 year old female with baseline IBS who gives 2-3 month history of significant bowel habit change. Requiring laxative use to have a BM. Saw her on May 1, started Trulance, states she did not have a bowel movement over 2 weeks. Added additional MiraLAX multiple times daily along with other over-the-counter laxatives without relief. Complains of abdominal distention/bloating. Abdominal film she showed me from her phone indicated a significant amount of stool. CT reassuringly is unremarkable. Patient had a bowel prep yesterday but denies having significant results. Complains of ongoing bloating. Previous labs indicate normal functioning thyroid, sedimentation rate normal, celiac screen negative. LFTs, CBC normal.  Obtain plain abdominal film today to reassess stool load. Start Amitiza 24 g twice a day, samples provided. If they are helpful she will call for prescription. Stop Levsin and Trulance. Suspect next step would be colonoscopy for change in bowel habits, pending abdominal film.

## 2016-03-30 NOTE — Patient Instructions (Signed)
1. Stop Trulance and Levsin. 2. Start Amitiza 40mcg twice daily with food for constipation. 3. Abdominal xray today to evaluate stool load. Likely scheduled a colonoscopy once results available.

## 2016-03-30 NOTE — Progress Notes (Signed)
Primary Care Physician: Sallee Lange, MD  Primary Gastroenterologist:  Garfield Cornea, MD   Chief Complaint  Patient presents with  . Follow-up  . Constipation    HPI: Cristina Davenport is a 30 y.o. female here For urgent office visit. Seen back on 03/14/2016 with complaints of nausea, bloating, constipation of 2-3 months duration. Patient has baseline IBS but states the symptoms were much different than her usual symptoms. She has a history of migraines as well. Previously did not tolerate Linzess, developed loose stools and was unable to exercise and afraid work due to fecal urgency. At her last office visit we tried Trulance. She did not feel that this helped at all. She tells me she went over 2-1/2 weeks without a bowel movement while on the medication. Labs revealed normal renal function, LFTs, sedimentation rate, CBC, celiac screen negative.  Last week she had significant abdominal bloating/swelling. Has not had a BM in nearly 2 weeks. She had a "secret" abdominal x-ray done at her place of employment which according to the patient showed that she was full of stool. She did show me a picture of it on her cell phone and there seemed to be a significant amount of stool present. Since we last saw her she went to see Dr. Collene Mares, gastroenterologist in [place] at the request of her physician at her place of employment. Patient states that she gave her Linzess again told her she wasn't taking it properly. Patient states that she does not plan to go back to see her. She had a CT scan ordered by Dr. Paulla Fore, I believe a physician she works with. No significant findings reported. [name] had complained of abdominal wall bulge which was marked with a soft tissue marker but no abnormality seen. Dr. Paulla Fore gave her a colon prep, she describes something like GoLYTELY. Patient states that she had 5-6 stools last one yesterday evening, last one yellow and clear.  She denies having any improvement of her  bloating and discomfort after bowel cleansing. She states she still feels miserable. She is frustrated. She states she cannot work out, she is "losing my muscle mass". She states she has worked hard to be in good shape and lose weight and now her stomach is bloated like she is pregnant. Denies blood in the stool or melena.  Patient states all of these symptoms began about 2-3 months ago. Never requiring laxative use before and now she has to use multiple laxatives to have a bowel movement. Took Trulance for over 2 weeks, added MiraLAX 3 times daily with Dulcolax and never went to the bathroom.    Current Outpatient Prescriptions  Medication Sig Dispense Refill  . clonazePAM (KLONOPIN) 0.5 MG tablet 1 bid prn 40 tablet 4  . escitalopram (LEXAPRO) 20 MG tablet Take 1 tablet (20 mg total) by mouth daily. 90 tablet 1  . hyoscyamine (LEVSIN/SL) 0.125 MG SL tablet Place 1 tablet (0.125 mg total) under the tongue every 6 (six) hours as needed for cramping. 60 tablet 3  . metoCLOPramide (REGLAN) 10 MG tablet Take at the onset of headache. May take with tylenol and imitrex as well. 90 tablet 12  . Plecanatide (TRULANCE) 3 MG TABS Take 3 mg by mouth daily. 30 tablet 3  . Probiotic Product (ALIGN) 4 MG CAPS Take 1 capsule (4 mg total) by mouth daily. 14 capsule 0  . SUMAtriptan (IMITREX) 100 MG tablet One now with migraine May repeat in 2 hours if headache persists  or recurs. 10 tablet 2  . Topiramate ER (TROKENDI XR) 100 MG CP24 Take 100 mg by mouth at bedtime. 30 capsule 12  . traZODone (DESYREL) 50 MG tablet One po qhs 90 tablet 1   No current facility-administered medications for this visit.    Allergies as of 03/30/2016 - Review Complete 03/30/2016  Allergen Reaction Noted  . Doxycycline  04/29/2013  . Latex Other (See Comments) 04/21/2012   Past Medical History  Diagnosis Date  . Anxiety   . Bipolar 1 disorder ([place])   . Anxiety   . GERD (gastroesophageal reflux disease)   . IBS (irritable  bowel syndrome)   . Migraine without aura    Past Surgical History  Procedure Laterality Date  . Examination under anesthesia  04/21/2012    Gluteal wound repair  . Esophagogastroduodenoscopy N/A 04/30/2013    [id number] distal esophagus of uncertain clinical significance-status post biopsy. Tiny hiatal hernia. No explanation  . Abdominal surgery      cyst removed   . Cholecystectomy N/A 07/10/2013    Procedure: LAPAROSCOPIC CHOLECYSTECTOMY;  Surgeon: Donato Heinz, MD;  Location: [facility];  Service: General;  Laterality: N/A;   Family History  Problem Relation Age of Onset  . Hypertension Maternal Grandmother   . Stroke Maternal Grandmother   . Hypertension Maternal Grandfather   . Stroke Maternal Grandfather   . Crohn's disease Maternal Aunt   . Crohn's disease Cousin   . Colon cancer Neg Hx   . Mental illness Mother    Social History   Social History  . Marital Status: Divorced    Spouse Name: N/A  . Number of Children: 1  . Years of Education: 12   Occupational History  . [employer]    Social History Main Topics  . Smoking status: Former Smoker -- 0.00 packs/day for 8 years    Quit date: 09/19/2014  . Smokeless tobacco: None  . Alcohol Use: 0.0 oz/week    0 Standard drinks or equivalent per week     Comment: occasionally,  social  . Drug Use: No  . Sexual Activity:    Partners: Male    Patent examiner Protection: None   Other Topics Concern  . 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 ENT: Negative for hoarseness, difficulty swallowing , nasal congestion. CV: Negative for chest pain, angina, palpitations, dyspnea on exertion, peripheral edema.  Respiratory: Negative for dyspnea at rest, dyspnea on exertion, cough, sputum, wheezing.  GI: See history of present illness. GU:  Negative for dysuria, hematuria,  urinary incontinence, urinary frequency, nocturnal urination.  Endo: Negative for unusual weight change.    Physical Examination:   BP 103/61 mmHg  Pulse 87  Temp(Src) 98.5 F (36.9 C) (Oral)  Ht 5\' 1"  (1.549 m)  Wt 136 lb 6.4 oz (61.871 kg)  BMI 25.79 kg/m2  LMP 03/05/2016  General: Well-nourished, well-developed in no acute distress.  Eyes: No icterus. Mouth: Oropharyngeal mucosa moist and pink , no lesions erythema or exudate. Lungs: Clear to auscultation bilaterally.  Heart: Regular rate and rhythm, no murmurs rubs or gallops.  Abdomen: Bowel sounds are normal, slightly distended. Slight mid-abd tenderness, no hepatosplenomegaly or masses, no abdominal bruits or hernia , no rebound or guarding.   Extremities: No lower extremity edema. No clubbing or deformities. Neuro: Alert and oriented x  4   Skin: Warm and dry, no jaundice.   Psych: Alert and cooperative, normal mood and affect.  Labs:  Lab Results  Component Value Date   CREATININE 0.78 03/04/2016   BUN 7 03/04/2016   NA 140 03/04/2016   K 4.7 03/04/2016   CL 99 03/04/2016   CO2 23 03/04/2016   Lab Results  Component Value Date   ALT 14 03/04/2016   AST 20 03/04/2016   ALKPHOS 53 03/04/2016   BILITOT 0.5 03/04/2016   Lab Results  Component Value Date   WBC 7.6 03/14/2016   HGB 13.3 09/23/2015   HCT 41.1 03/14/2016   MCV 92 03/14/2016   PLT 268 03/14/2016   Lab Results  Component Value Date   ESRSEDRATE 2 03/14/2016   Lab Results  Component Value Date   TSH 0.805 11/04/2015     Imaging Studies: Ct Abdomen Pelvis W Contrast  03/28/2016  CLINICAL DATA:  Abdominal pain, prior cholecystectomy, mid abdominal bulge EXAM: CT ABDOMEN AND PELVIS WITH CONTRAST TECHNIQUE: Multidetector CT imaging of the abdomen and pelvis was performed using the standard protocol following bolus administration of intravenous contrast. CONTRAST:  163mL ISOVUE-300 IOPAMIDOL (ISOVUE-300) INJECTION 61% COMPARISON:  CT abdomen  pelvis dated 07/19/2013 FINDINGS: Lower chest:  Lung bases are clear. Hepatobiliary: Liver is within normal limits. Status post cholecystectomy. No intrahepatic or extrahepatic ductal dilatation. Pancreas: Within normal limits. Spleen: Within normal limits. Adrenals/Urinary Tract: Adrenal glands are within normal limits. Kidneys are within normal limits.  No hydronephrosis. Bladder is within normal limits. Stomach/Bowel: Stomach is within normal limits. No evidence of bowel obstruction. Normal appendix (series 2/ image 56). Vascular/Lymphatic: No evidence of abdominal aortic aneurysm. No suspicious abdominopelvic lymphadenopathy. Reproductive: Retroverted uterus is within normal limits. Bilateral ovaries are within normal limits. Other: No abdominopelvic ascites. Musculoskeletal: Soft tissue marker marking the area of the patient's clinical concern along the right upper abdominal wall (series 2/ image 21). No underlying CT abnormality. No soft tissue mass in the anterior abdominal wall. No evidence of ventral hernia. Visualized osseous structures are within normal limits. IMPRESSION: No CT abnormality in the anterior abdominal wall to correspond to the area of the patient's clinical concern. Status post cholecystectomy. Otherwise negative CT abdomen/pelvis. Electronically Signed   By: Julian Hy M.D.   On: 03/28/2016 12:18

## 2016-03-30 NOTE — Progress Notes (Signed)
CC'ED TO PCP 

## 2016-03-31 ENCOUNTER — Ambulatory Visit: Payer: 59 | Admitting: Gastroenterology

## 2016-04-01 NOTE — Progress Notes (Signed)
Quick Note:  Air within loops of small bowel but non-dilated. Normal colonic gas, with no signs of obstruction. Suspect air within small bowel more related to colon cleanse procedure than developing ileus. Bloating at this point, likely related to air within small bowel.   Recommend colonoscopy in the OR (polypharmacy) with RMR for change in bowel habit. Patient to continue amitiza 12mcg bid. Three days before bowel prep, dulcolax 10mg  po daily.  ______

## 2016-04-05 ENCOUNTER — Telehealth: Payer: Self-pay

## 2016-04-05 NOTE — Telephone Encounter (Signed)
As discussed, patient needs to stop amitiza. Would recommend she follow up with gyn regarding safe options for management of constipation if needed.

## 2016-04-05 NOTE — Telephone Encounter (Signed)
Pt notified via mychart

## 2016-04-05 NOTE — Telephone Encounter (Signed)
I had sent the pt a message this morning about her results, pt sent me a message back stating she found out yesterday that she was pregnant. I sent her a message back, informing her that I would let LSL know and to stop taking amitiza immediately.

## 2016-04-10 ENCOUNTER — Encounter (HOSPITAL_COMMUNITY): Payer: Self-pay

## 2016-04-10 ENCOUNTER — Inpatient Hospital Stay (HOSPITAL_COMMUNITY)
Admission: AD | Admit: 2016-04-10 | Discharge: 2016-04-10 | Disposition: A | Discharge status: Home / Self Care | Payer: 59 | Source: Ambulatory Visit | Attending: Family Medicine | Admitting: Family Medicine

## 2016-04-10 DIAGNOSIS — F319 Bipolar disorder, unspecified: Secondary | ICD-10-CM | POA: Insufficient documentation

## 2016-04-10 DIAGNOSIS — K219 Gastro-esophageal reflux disease without esophagitis: Secondary | ICD-10-CM | POA: Diagnosis not present

## 2016-04-10 DIAGNOSIS — K589 Irritable bowel syndrome without diarrhea: Secondary | ICD-10-CM | POA: Insufficient documentation

## 2016-04-10 DIAGNOSIS — Z87891 Personal history of nicotine dependence: Secondary | ICD-10-CM | POA: Diagnosis not present

## 2016-04-10 DIAGNOSIS — Z91040 Latex allergy status: Secondary | ICD-10-CM | POA: Insufficient documentation

## 2016-04-10 DIAGNOSIS — Z881 Allergy status to other antibiotic agents status: Secondary | ICD-10-CM | POA: Diagnosis not present

## 2016-04-10 DIAGNOSIS — Z79899 Other long term (current) drug therapy: Secondary | ICD-10-CM | POA: Insufficient documentation

## 2016-04-10 DIAGNOSIS — O039 Complete or unspecified spontaneous abortion without complication: Secondary | ICD-10-CM | POA: Insufficient documentation

## 2016-04-10 DIAGNOSIS — N939 Abnormal uterine and vaginal bleeding, unspecified: Secondary | ICD-10-CM | POA: Diagnosis present

## 2016-04-10 LAB — URINE MICROSCOPIC-ADD ON

## 2016-04-10 LAB — URINALYSIS, ROUTINE W REFLEX MICROSCOPIC
Bilirubin Urine: NEGATIVE
Glucose, UA: NEGATIVE mg/dL
Ketones, ur: NEGATIVE mg/dL
Leukocytes, UA: NEGATIVE
Nitrite: NEGATIVE
Protein, ur: NEGATIVE mg/dL
Specific Gravity, Urine: 1.01 (ref 1.005–1.030)
pH: 7 (ref 5.0–8.0)

## 2016-04-10 LAB — CBC
HCT: 38.7 % (ref 36.0–46.0)
Hemoglobin: 12.8 g/dL (ref 12.0–15.0)
MCH: 29.5 pg (ref 26.0–34.0)
MCHC: 33.1 g/dL (ref 30.0–36.0)
MCV: 89.2 fL (ref 78.0–100.0)
Platelets: 236 10*3/uL (ref 150–400)
RBC: 4.34 MIL/uL (ref 3.87–5.11)
RDW: 13.2 % (ref 11.5–15.5)
WBC: 7.2 10*3/uL (ref 4.0–10.5)

## 2016-04-10 LAB — ABO/RH: ABO/RH(D): A POS

## 2016-04-10 LAB — HCG, QUANTITATIVE, PREGNANCY: hCG, Beta Chain, Quant, S: 10 m[IU]/mL — ABNORMAL HIGH (ref <5)

## 2016-04-10 LAB — POCT PREGNANCY, URINE: Preg Test, Ur: NEGATIVE

## 2016-04-10 NOTE — Discharge Instructions (Signed)
Miscarriage  A miscarriage is the sudden loss of an unborn baby (fetus) before the 20th week of pregnancy. Most miscarriages happen in the first 3 months of pregnancy. Sometimes, it happens before a woman even knows she is pregnant. A miscarriage is also called a "spontaneous miscarriage" or "early pregnancy loss." Having a miscarriage can be an emotional experience. Talk with your caregiver about any questions you may have about miscarrying, the grieving process, and your future pregnancy plans.  CAUSES    Problems with the fetal chromosomes that make it impossible for the baby to develop normally. Problems with the baby's genes or chromosomes are most often the result of errors that occur, by chance, as the embryo divides and grows. The problems are not inherited from the parents.   Infection of the cervix or uterus.    Hormone problems.    Problems with the cervix, such as having an incompetent cervix. This is when the tissue in the cervix is not strong enough to hold the pregnancy.    Problems with the uterus, such as an abnormally shaped uterus, uterine fibroids, or congenital abnormalities.    Certain medical conditions.    Smoking, drinking alcohol, or taking illegal drugs.    Trauma.   Often, the cause of a miscarriage is unknown.   SYMPTOMS    Vaginal bleeding or spotting, with or without cramps or pain.   Pain or cramping in the abdomen or lower back.   Passing fluid, tissue, or blood clots from the vagina.  DIAGNOSIS   Your caregiver will perform a physical exam. You may also have an ultrasound to confirm the miscarriage. Blood or urine tests may also be ordered.  TREATMENT    Sometimes, treatment is not necessary if you naturally pass all the fetal tissue that was in the uterus. If some of the fetus or placenta remains in the body (incomplete miscarriage), tissue left behind may become infected and must be removed. Usually, a dilation and curettage (D and C) procedure is performed.  During a D and C procedure, the cervix is widened (dilated) and any remaining fetal or placental tissue is gently removed from the uterus.   Antibiotic medicines are prescribed if there is an infection. Other medicines may be given to reduce the size of the uterus (contract) if there is a lot of bleeding.   If you have Rh negative blood and your baby was Rh positive, you will need a Rh immunoglobulin shot. This shot will protect any future baby from having Rh blood problems in future pregnancies.  HOME CARE INSTRUCTIONS    Your caregiver may order bed rest or may allow you to continue light activity. Resume activity as directed by your caregiver.   Have someone help with home and family responsibilities during this time.    Keep track of the number of sanitary pads you use each day and how soaked (saturated) they are. Write down this information.    Do not use tampons. Do not douche or have sexual intercourse until approved by your caregiver.    Only take over-the-counter or prescription medicines for pain or discomfort as directed by your caregiver.    Do not take aspirin. Aspirin can cause bleeding.    Keep all follow-up appointments with your caregiver.    If you or your partner have problems with grieving, talk to your caregiver or seek counseling to help cope with the pregnancy loss. Allow enough time to grieve before trying to get pregnant again.     SEEK IMMEDIATE MEDICAL CARE IF:    You have severe cramps or pain in your back or abdomen.   You have a fever.   You pass large blood clots (walnut-sized or larger) ortissue from your vagina. Save any tissue for your caregiver to inspect.    Your bleeding increases.    You have a thick, bad-smelling vaginal discharge.   You become lightheaded, weak, or you faint.    You have chills.   MAKE SURE YOU:   Understand these instructions.   Will watch your condition.   Will get help right away if you are not doing well or get worse.     This  information is not intended to replace advice given to you by your health care provider. Make sure you discuss any questions you have with your health care provider.     Document Released: 04/26/2001 Document Revised: 02/25/2013 Document Reviewed: 12/20/2011  Elsevier Interactive Patient Education 2016 [employer]

## 2016-04-10 NOTE — MAU Note (Signed)
Small amount of vaginal bleeding started last night, not soaking pads only wearing panty liners. No pain

## 2016-04-10 NOTE — MAU Provider Note (Signed)
History     CSN: [id number]  Arrival date and time: 04/10/16 [id number]   None     Chief Complaint  Patient presents with  . Vaginal Bleeding   HPI   Ms.MCKYNLEIGH RIM is a 30 y.o. female G38P0101 @ [redacted]w[redacted]d here with complaints of vaginal bleeding that started this morning. She had a positive pregnancy test last week and has an appointment scheduled in June with [facility]. This will be her first appointment with this practice.   The bleeding is described as light, dark red. She denies pain.   OB History    Gravida Para Term Preterm AB TAB SAB Ectopic Multiple Living   2 1 0 1 0  0   1      Past Medical History  Diagnosis Date  . Anxiety   . Bipolar 1 disorder ([place])   . Anxiety   . GERD (gastroesophageal reflux disease)   . IBS (irritable bowel syndrome)   . Migraine without aura     Past Surgical History  Procedure Laterality Date  . Examination under anesthesia  04/21/2012    Gluteal wound repair  . Esophagogastroduodenoscopy N/A 04/30/2013    [id number] distal esophagus of uncertain clinical significance-status post biopsy. Tiny hiatal hernia. No explanation  . Abdominal surgery      cyst removed   . Cholecystectomy N/A 07/10/2013    Procedure: LAPAROSCOPIC CHOLECYSTECTOMY;  Surgeon: Donato Heinz, MD;  Location: [facility];  Service: General;  Laterality: N/A;    Family History  Problem Relation Age of Onset  . Hypertension Maternal Grandmother   . Stroke Maternal Grandmother   . Hypertension Maternal Grandfather   . Stroke Maternal Grandfather   . Crohn's disease Maternal Aunt   . Crohn's disease Cousin   . Colon cancer Neg Hx   . Mental illness Mother     Social History  Substance Use Topics  . Smoking status: Former Smoker -- 0.00 packs/day for 8 years    Quit date: 09/19/2014  . Smokeless tobacco: None  . Alcohol Use: 0.0 oz/week    0 Standard drinks or equivalent per week     Comment: occasionally,  social    Allergies:  Allergies  Allergen  Reactions  . Doxycycline     Solar rash  . Latex Other (See Comments)    General irritation    Prescriptions prior to admission  Medication Sig Dispense Refill Last Dose  . escitalopram (LEXAPRO) 20 MG tablet Take 1 tablet (20 mg total) by mouth daily. 90 tablet 1 04/09/2016 at Unknown time  . clonazePAM (KLONOPIN) 0.5 MG tablet 1 bid prn 40 tablet 4 Taking  . lubiprostone (AMITIZA) 24 MCG capsule Take 1 capsule (24 mcg total) by mouth 2 (two) times daily with a meal. 60 capsule 3   . metoCLOPramide (REGLAN) 10 MG tablet Take at the onset of headache. May take with tylenol and imitrex as well. 90 tablet 12 Taking  . Probiotic Product (ALIGN) 4 MG CAPS Take 1 capsule (4 mg total) by mouth daily. 14 capsule 0 Taking  . SUMAtriptan (IMITREX) 100 MG tablet One now with migraine May repeat in 2 hours if headache persists or recurs. 10 tablet 2 Taking  . Topiramate ER (TROKENDI XR) 100 MG CP24 Take 100 mg by mouth at bedtime. 30 capsule 12 Taking  . traZODone (DESYREL) 50 MG tablet One po qhs 90 tablet 1 Taking   Results for orders placed or performed during the hospital encounter of 04/10/16 (  from the past 48 hour(s))  Urinalysis, Routine w reflex microscopic (not at [facility])     Status: Abnormal   Collection Time: 04/10/16  7:45 AM  Result Value Ref Range   Color, Urine YELLOW YELLOW   APPearance CLEAR CLEAR   Specific Gravity, Urine 1.010 1.005 - 1.030   pH 7.0 5.0 - 8.0   Glucose, UA NEGATIVE NEGATIVE mg/dL   Hgb urine dipstick MODERATE (A) NEGATIVE   Bilirubin Urine NEGATIVE NEGATIVE   Ketones, ur NEGATIVE NEGATIVE mg/dL   Protein, ur NEGATIVE NEGATIVE mg/dL   Nitrite NEGATIVE NEGATIVE   Leukocytes, UA NEGATIVE NEGATIVE  Urine microscopic-add on     Status: Abnormal   Collection Time: 04/10/16  7:45 AM  Result Value Ref Range   Squamous Epithelial / LPF 0-5 (A) NONE SEEN   WBC, UA 0-5 0 - 5 WBC/hpf   RBC / HPF 0-5 0 - 5 RBC/hpf   Bacteria, UA FEW (A) NONE SEEN   Urine-Other MUCOUS  PRESENT   Pregnancy, urine POC     Status: None   Collection Time: 04/10/16  7:57 AM  Result Value Ref Range   Preg Test, Ur NEGATIVE NEGATIVE    Comment:        THE SENSITIVITY OF THIS METHODOLOGY IS >24 mIU/mL   hCG, quantitative, pregnancy     Status: Abnormal   Collection Time: 04/10/16  8:23 AM  Result Value Ref Range   hCG, Beta Chain, Quant, S 10 (H) <5 mIU/mL    Comment:          GEST. AGE      CONC.  (mIU/mL)   <=1 WEEK        5 - 50     2 WEEKS       50 - 500     3 WEEKS       100 - 10,000     4 WEEKS     1,000 - 30,000     5 WEEKS     3,500 - 115,000   6-8 WEEKS     12,000 - 270,000    12 WEEKS     15,000 - 220,000        FEMALE AND NON-PREGNANT FEMALE:     LESS THAN 5 mIU/mL   CBC     Status: None   Collection Time: 04/10/16  8:23 AM  Result Value Ref Range   WBC 7.2 4.0 - 10.5 K/uL   RBC 4.34 3.87 - 5.11 MIL/uL   Hemoglobin 12.8 12.0 - 15.0 g/dL   HCT 38.7 36.0 - 46.0 %   MCV 89.2 78.0 - 100.0 fL   MCH 29.5 26.0 - 34.0 pg   MCHC 33.1 30.0 - 36.0 g/dL   RDW 13.2 11.5 - 15.5 %   Platelets 236 150 - 400 K/uL  ABO/Rh     Status: None   Collection Time: 04/10/16  8:23 AM  Result Value Ref Range   ABO/RH(D) A POS     Review of Systems  Constitutional: Negative for fever and chills.  Gastrointestinal: Negative for nausea, vomiting, abdominal pain and diarrhea.  Genitourinary: Negative for dysuria.   Physical Exam   Blood pressure 115/70, pulse 73, temperature 98.2 F (36.8 C), temperature source Oral, resp. rate 16, height 5\' 1"  (1.549 m), weight 138 lb (62.596 kg), last menstrual period 03/03/2016.  Physical Exam  Constitutional: She is oriented to person, place, and time. She appears well-developed and well-nourished. No distress.  Respiratory: Effort  normal.  GI: Soft. She exhibits no distension. There is no tenderness. There is no rebound.  Genitourinary:  Bimanual exam: Cervix closed, long, Anterior. Small amount of blood noted on exam glove.   Uterus non tender, normal size Adnexa non tender, no masses bilaterally Chaperone present for exam.  Musculoskeletal: Normal range of motion.  Neurological: She is alert and oriented to person, place, and time.  Skin: Skin is warm. She is not diaphoretic.  Psychiatric: Her behavior is normal. Her mood appears anxious. She exhibits a depressed mood (Patient crying ).    MAU Course  Procedures  None  MDM  Urine pregnancy test- negative today Hcg level CBC  A positive blood type Discussed lab results with patient and significant other. Patient upset, dressed and ready to go home.   Assessment and Plan   A:  1. SAB (spontaneous abortion)     P:  Discharge home in stable condition Patient to call [facility] on Tuesday and schedule a follow up SAB appointment Bleeding precautions Patient to return to MAU if symptoms worsen  Lezlie Lye, NP 04/10/2016 2:02 PM

## 2016-04-12 ENCOUNTER — Ambulatory Visit (INDEPENDENT_AMBULATORY_CARE_PROVIDER_SITE_OTHER): Payer: 59 | Admitting: Obstetrics & Gynecology

## 2016-04-12 ENCOUNTER — Encounter: Payer: Self-pay | Admitting: Obstetrics & Gynecology

## 2016-04-12 ENCOUNTER — Telehealth: Payer: Self-pay | Admitting: Obstetrics and Gynecology

## 2016-04-12 VITALS — BP 114/68 | HR 86 | Resp 18 | Wt 143.4 lb

## 2016-04-12 DIAGNOSIS — O039 Complete or unspecified spontaneous abortion without complication: Secondary | ICD-10-CM | POA: Diagnosis not present

## 2016-04-12 LAB — HCG, QUANTITATIVE, PREGNANCY: hCG, Beta Chain, Quant, S: <2 m[IU]/mL

## 2016-04-12 MED ORDER — TRAMADOL HCL 50 MG PO TABS: 50.0000 mg | ORAL_TABLET | Freq: Four times a day (QID) | ORAL | Status: DC | PRN

## 2016-04-12 NOTE — Telephone Encounter (Signed)
From: Cristina Davenport    Sent: 04/12/2016  9:19 AM EDT      To: Patient Appointment Schedule Request Mailing List Subject: Appointment Request  Appointment Request From: Cristina Davenport  With Provider: Arloa Koh, MD [facility]]  Preferred Date Range: From 04/25/2016 To 04/29/2016  Reason for visit: Office Visit  Comments: I recently visited [facility] hospital for a miscarriage they told me I need to follow up with my obgyn for a checkup. I am available early in the morning or late in the afternoon. Is there anything I can take that will help other than ibuprofen for pain, because it has not helped at all!!!   thank you.  [name]

## 2016-04-12 NOTE — Telephone Encounter (Signed)
Patient has kept her appointment to see Dr.Miller today at 2 pm. She is currently in the office for evaluation.  Routing to provider for final review. Patient agreeable to disposition. Will close encounter.

## 2016-04-12 NOTE — Progress Notes (Signed)
GYNECOLOGY  VISIT   HPI: 30 y.o. G2P1 her for follow up from MAU visit on Sunday.  She went there due to spotting.  She'd taken two home UPTs in the prior weeks that were both positive.  LMP was around 03/01/16 which makes her around six weeks.   (Review of chart showed negative UPT 03/04/16).  Blood type A+.  Pt reports she wasn't actively preventing pregnancy after IUD removed in November but wasn't exactly trying either.  "If it happens, it happens but I'd rather be married first".  Reports she's been with current partner, who is co-parenting her daughter with her (but not biological father), for seven years.  In MAU, pt has blood work and an exam.  HCG was 10.  Pt reports having increased bleeding for about a day and then spotting for another day and a half.  This would be a short cycle for the pt.  Pt thinks bleeding has stopped.  She reports that right before the bleeding, she has an episode of diarrhea which is also very unusual for her.  Reports she typically has very significant constipation.  Has been having daily BMs since then.  She asks if the diarrhea "caused the bleeding".  Likelihood of diarrhea and miscarriage discussed.  Denies temperatures.  She reports she is having some lower back pain on the left and cramping.  Denies any changes in urinary symptoms.  GYNECOLOGIC HISTORY: Patient's last menstrual period was 03/03/2016. Contraception: none, IUD removed by Dr. Quincy Simmonds in November  Patient Active Problem List   Diagnosis Date Noted  . Change in bowel habits 03/30/2016  . Constipation 03/14/2016  . Abdominal bloating 03/14/2016  . Migraine headache 03/12/2016  . Anxiety 04/17/2015  . Low grade squamous intraepithelial lesion on cytologic smear of cervix (lgsil) 09/15/2014  . Adjustment disorder with anxious mood 09/14/2014  . Abdominal pain, epigastric 04/26/2013  . Nausea with vomiting 04/26/2013  . Gastritis 02/23/2013    Past Medical History  Diagnosis Date  . Anxiety    . Bipolar 1 disorder ([place])   . Anxiety   . GERD (gastroesophageal reflux disease)   . IBS (irritable bowel syndrome)   . Migraine without aura     Past Surgical History  Procedure Laterality Date  . Examination under anesthesia  04/21/2012    Gluteal wound repair  . Esophagogastroduodenoscopy N/A 04/30/2013    [id number] distal esophagus of uncertain clinical significance-status post biopsy. Tiny hiatal hernia. No explanation  . Abdominal surgery      cyst removed   . Cholecystectomy N/A 07/10/2013    Procedure: LAPAROSCOPIC CHOLECYSTECTOMY;  Surgeon: Donato Heinz, MD;  Location: [facility];  Service: General;  Laterality: N/A;    MEDS:  Reviewed in EPIC and UTD  ALLERGIES: Doxycycline and Latex  Family History  Problem Relation Age of Onset  . Hypertension Maternal Grandmother   . Stroke Maternal Grandmother   . Hypertension Maternal Grandfather   . Stroke Maternal Grandfather   . Crohn's disease Maternal Aunt   . Crohn's disease Cousin   . Colon cancer Neg Hx   . Mental illness Mother    SH:  single  ROS  PHYSICAL EXAMINATION:    BP 114/68 mmHg  Pulse 86  Resp 18  Wt 143 lb 6.4 oz (65.046 kg)  LMP 03/03/2016    General appearance: alert, cooperative and appears stated age CV:  Regular rate and rhythm Lungs:  clear to auscultation, no wheezes, rales or rhonchi, symmetric air entry Abdomen: soft,  left sided, mid abdominal tenderness to palpation, no masses or firmness, no organomegaly, no hernia.  No rebound or guarding.  Pelvic: External genitalia:  no lesions              Urethra:  normal appearing urethra with no masses, tenderness or lesions              Bartholins and Skenes: normal                 Vagina: normal appearing vagina with normal color and discharge, no lesions              Cervix: no lesions              Bimanual Exam:  Uterus:  normal size, contour, position, consistency, mobility, non-tender              Adnexa: no mass, fullness,  tenderness              Anus:  normal sphincter tone, no lesions  Chaperone was present for exam.  Assessment: SAB starting on 5/28 with HCG 10 MBT A+ H/O anxiety H/O IBS  Plan: Quant HCG.  If not falling will need PUS.  Pt and I discussed possibility of ectopic pregnancy and need for additional evaluation if HCG is not going down appropriately.  Precautions for returning to ER given.  Pt voices understanding. Ultram 50mg  1-2 tabs every 6 hours prn.  #30/0RF.

## 2016-04-12 NOTE — Telephone Encounter (Signed)
Spoke with patient. Patient was seen at [facility] on 04/10/2016 for light spotting during early pregnancy. States she had a large amount of bleeding while at the hospital and was advised she was having a miscarriage. HCG on 5/28 was 10. Please see note in EPIC. Was advised to follow up with our office. Reports she is still having light spotting and pelvic pain that is 10/10. States she is taking Ibuprofen every 6 hours that is not helping with her discomfort. Patient is requesting new pain medication for discomfort. Denies any fever or chills. Advised she will need to be seen in the office for further evaluation today. She is agreeable, but states she is only able to be seen in the afternoon due to her work schedule. Appointment scheduled for today 04/12/2016 at 2 pm with Dr.Miller as Dr.Silva is out of the office today. She is agreeable to date and time and to see Dr.Miller. Patient has concerns about copay. Advised I will speak with office administrator and have someone call to discuss this information with her. She is agreeable.  Cc: Thayer Ohm

## 2016-04-12 NOTE — Telephone Encounter (Signed)
Left message to call [facility] at [phone].  Patient was seen at [facility] on 04/10/2016 (note available in EPIC).

## 2016-04-12 NOTE — Telephone Encounter (Signed)
Patient is asking to talk with [facility] regarding conversation earlier today.

## 2016-04-12 NOTE — Telephone Encounter (Signed)
Left message to call Cristina Davenport at [phone]. 

## 2016-04-13 ENCOUNTER — Telehealth: Payer: Self-pay

## 2016-04-13 NOTE — Telephone Encounter (Signed)
Spoke with patient. Results given as seen below. Patient is agreeable and verbalizes understanding. States she is still uncomfortable, but feels better today than yesterday. She will contact the office for further assessment if pain returns/persists or has irregular bleeding. Patient requesting to speak with office administrator. Marketing executive notified for call to patient.  Routing to provider for final review. Patient agreeable to disposition. Will close encounter.

## 2016-04-13 NOTE — Telephone Encounter (Signed)
Left message to call Shellie Goettl at [phone]. 

## 2016-04-13 NOTE — Telephone Encounter (Signed)
-----   Message from Megan Salon, MD sent at 04/13/2016  5:40 AM EDT ----- Please notify pt that her HCG was less than 2.0.  No additional testing is needed unless she continues to have pain or has irregular bleeding.  Thanks.

## 2016-06-08 ENCOUNTER — Ambulatory Visit: Payer: 59 | Admitting: Neurology

## 2016-06-14 ENCOUNTER — Other Ambulatory Visit: Payer: Self-pay | Admitting: Nurse Practitioner

## 2016-06-17 DIAGNOSIS — S39011A Strain of muscle, fascia and tendon of abdomen, initial encounter: Secondary | ICD-10-CM | POA: Diagnosis not present

## 2016-09-05 ENCOUNTER — Encounter: Payer: Self-pay | Admitting: Obstetrics and Gynecology

## 2016-09-15 ENCOUNTER — Other Ambulatory Visit (INDEPENDENT_AMBULATORY_CARE_PROVIDER_SITE_OTHER): Payer: Self-pay | Admitting: Sports Medicine

## 2016-09-15 MED ORDER — VALACYCLOVIR HCL 1 G PO TABS: 1000.0000 mg | ORAL_TABLET | Freq: Two times a day (BID) | ORAL | 0 refills | Status: AC

## 2016-09-15 NOTE — Progress Notes (Addendum)
Seen today in clinic. Starting to develop a cold sore along her lip. Has had these in the past. Rx for Valtrex called in

## 2016-09-19 ENCOUNTER — Inpatient Hospital Stay (HOSPITAL_COMMUNITY)
Admission: AD | Admit: 2016-09-19 | Discharge: 2016-09-19 | Disposition: A | Discharge status: Home / Self Care | Payer: 59 | Source: Ambulatory Visit | Attending: Gynecology | Admitting: Gynecology

## 2016-09-19 ENCOUNTER — Ambulatory Visit (INDEPENDENT_AMBULATORY_CARE_PROVIDER_SITE_OTHER): Payer: Self-pay | Admitting: Obstetrics and Gynecology

## 2016-09-19 ENCOUNTER — Encounter (HOSPITAL_COMMUNITY): Payer: Self-pay | Admitting: Obstetrics and Gynecology

## 2016-09-19 ENCOUNTER — Telehealth: Payer: Self-pay | Admitting: Nurse Practitioner

## 2016-09-19 ENCOUNTER — Encounter: Payer: Self-pay | Admitting: Obstetrics and Gynecology

## 2016-09-19 ENCOUNTER — Inpatient Hospital Stay (HOSPITAL_COMMUNITY): Payer: 59

## 2016-09-19 VITALS — BP 102/68 | HR 76 | Resp 16 | Ht 61.0 in | Wt 148.4 lb

## 2016-09-19 DIAGNOSIS — O26891 Other specified pregnancy related conditions, first trimester: Secondary | ICD-10-CM | POA: Diagnosis not present

## 2016-09-19 DIAGNOSIS — O209 Hemorrhage in early pregnancy, unspecified: Secondary | ICD-10-CM | POA: Diagnosis not present

## 2016-09-19 DIAGNOSIS — M25559 Pain in unspecified hip: Secondary | ICD-10-CM | POA: Diagnosis not present

## 2016-09-19 DIAGNOSIS — O26851 Spotting complicating pregnancy, first trimester: Secondary | ICD-10-CM

## 2016-09-19 DIAGNOSIS — N83209 Unspecified ovarian cyst, unspecified side: Secondary | ICD-10-CM | POA: Insufficient documentation

## 2016-09-19 DIAGNOSIS — O3481 Maternal care for other abnormalities of pelvic organs, first trimester: Secondary | ICD-10-CM | POA: Diagnosis not present

## 2016-09-19 DIAGNOSIS — Z3201 Encounter for pregnancy test, result positive: Secondary | ICD-10-CM

## 2016-09-19 DIAGNOSIS — O26899 Other specified pregnancy related conditions, unspecified trimester: Secondary | ICD-10-CM

## 2016-09-19 DIAGNOSIS — O3482 Maternal care for other abnormalities of pelvic organs, second trimester: Secondary | ICD-10-CM | POA: Diagnosis not present

## 2016-09-19 DIAGNOSIS — Z3A01 Less than 8 weeks gestation of pregnancy: Secondary | ICD-10-CM | POA: Insufficient documentation

## 2016-09-19 DIAGNOSIS — R109 Unspecified abdominal pain: Secondary | ICD-10-CM | POA: Insufficient documentation

## 2016-09-19 DIAGNOSIS — O3680X0 Pregnancy with inconclusive fetal viability, not applicable or unspecified: Secondary | ICD-10-CM

## 2016-09-19 DIAGNOSIS — Z3A00 Weeks of gestation of pregnancy not specified: Secondary | ICD-10-CM | POA: Diagnosis not present

## 2016-09-19 LAB — CBC
HCT: 38.7 % (ref 36.0–46.0)
Hemoglobin: 13.3 g/dL (ref 12.0–15.0)
MCH: 30 pg (ref 26.0–34.0)
MCHC: 34.4 g/dL (ref 30.0–36.0)
MCV: 87.2 fL (ref 78.0–100.0)
Platelets: 245 10*3/uL (ref 150–400)
RBC: 4.44 MIL/uL (ref 3.87–5.11)
RDW: 13.1 % (ref 11.5–15.5)
WBC: 9 10*3/uL (ref 4.0–10.5)

## 2016-09-19 LAB — POCT URINE PREGNANCY: Preg Test, Ur: POSITIVE — AB

## 2016-09-19 LAB — HCG, QUANTITATIVE, PREGNANCY: hCG, Beta Chain, Quant, S: 117 m[IU]/mL — ABNORMAL HIGH (ref <5)

## 2016-09-19 IMAGING — US US OB COMP LESS 14 WK
1 series · 15 of 28 positions shown · non-contrast
Comparison: None.

CLINICAL DATA: Pregnant, abdominal pain, vaginal bleeding/
spotting, beta HCG 117

EXAM:
OBSTETRIC <14 WK US AND TRANSVAGINAL OB US
TECHNIQUE: Both transabdominal and transvaginal ultrasound examinations were
performed for complete evaluation of the gestation as well as the
maternal uterus, adnexal regions, and pelvic cul-de-sac.
Transvaginal technique was performed to assess early pregnancy.

[Series 1: us ob comp less 14 wk · 66 acquisitions, 15 frames shown]
[im 1/66]
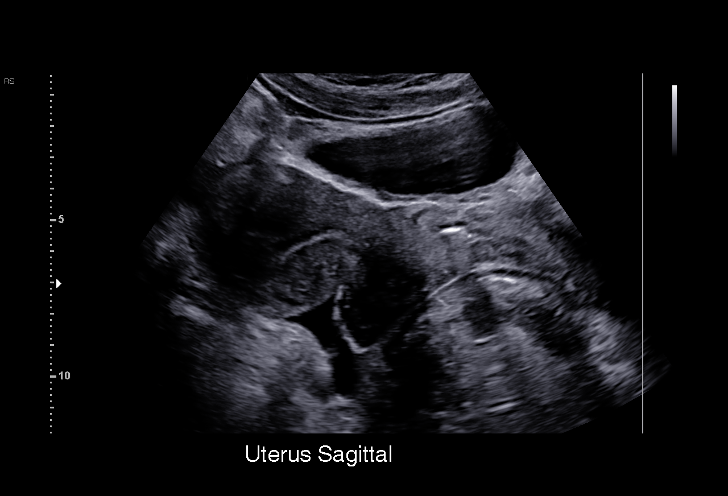
[im 5/66]
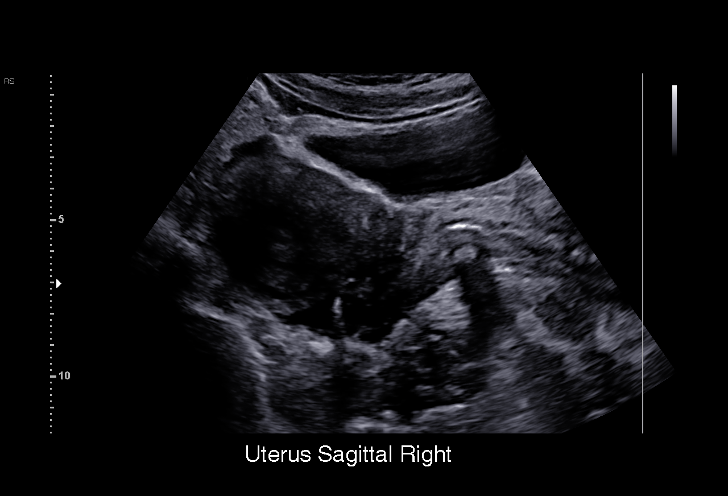
[im 10/66]
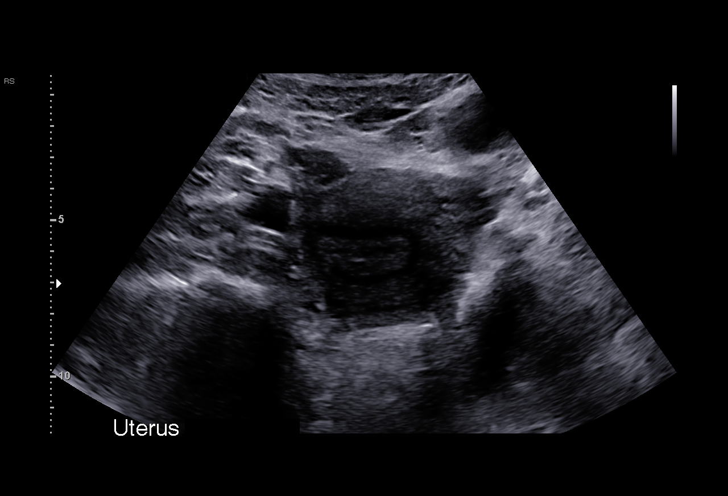
[im 15/66]
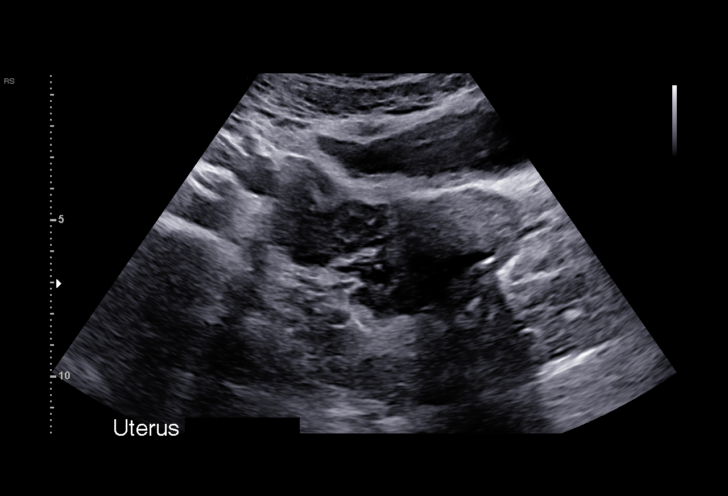
[im 20/66]
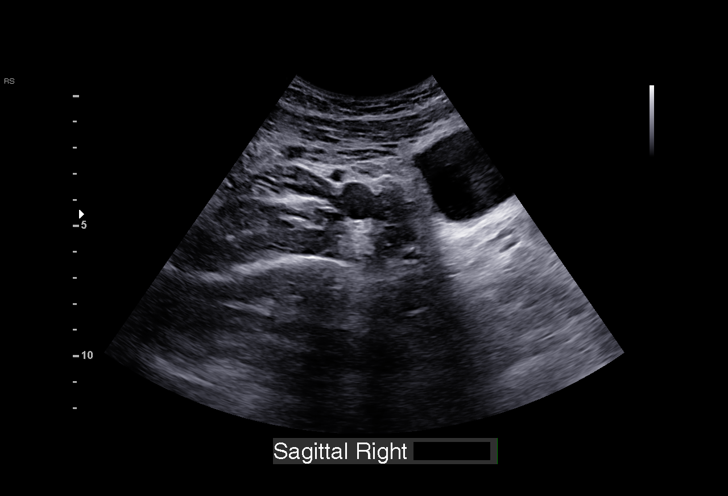
[im 25/66]
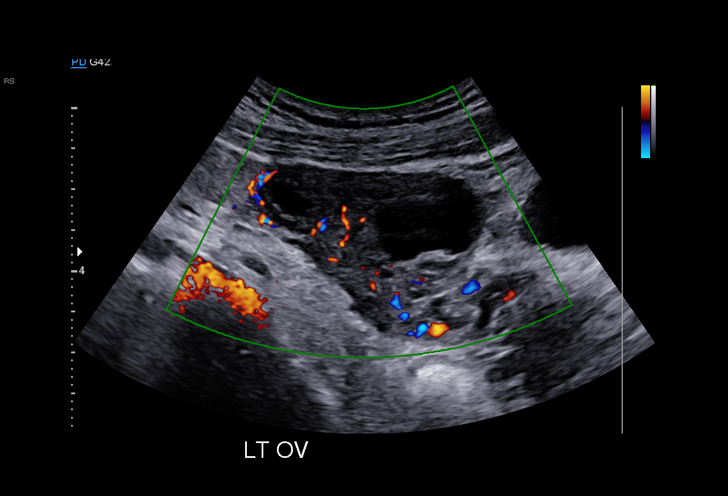
[im 29/66]
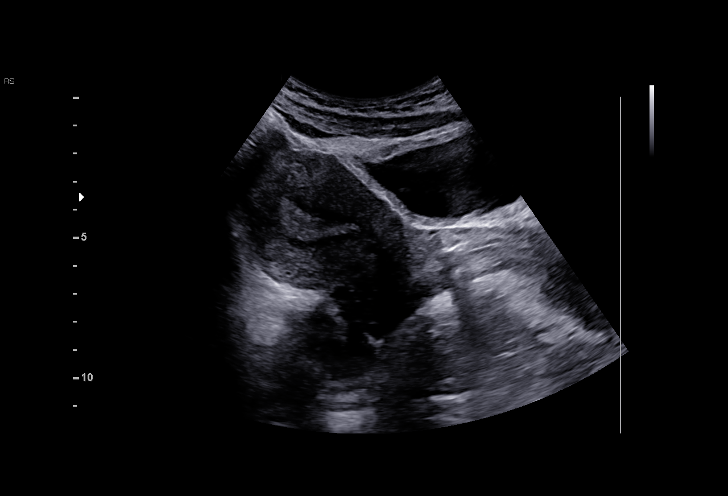
[im 34/66]
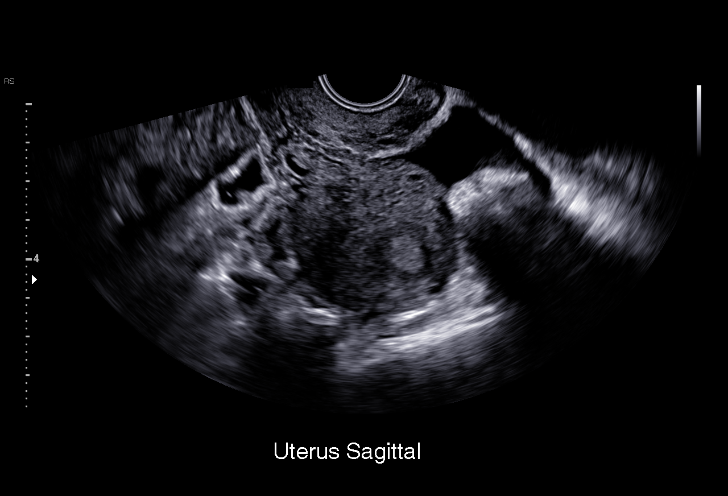
[im 37/66]
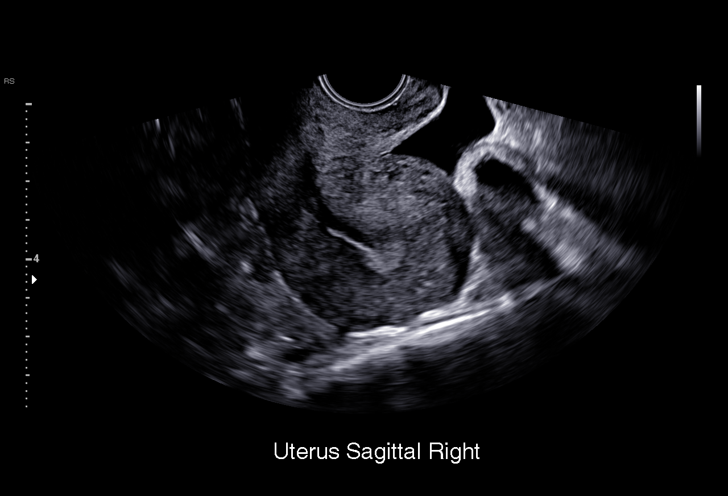
[im 41/66]
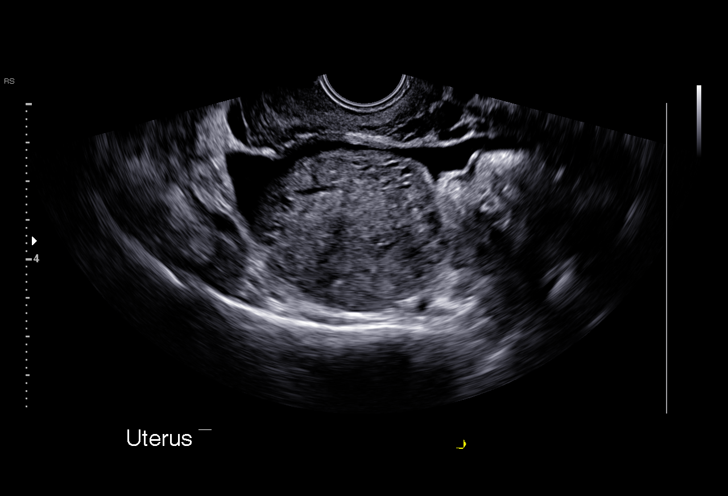
[im 46/66]
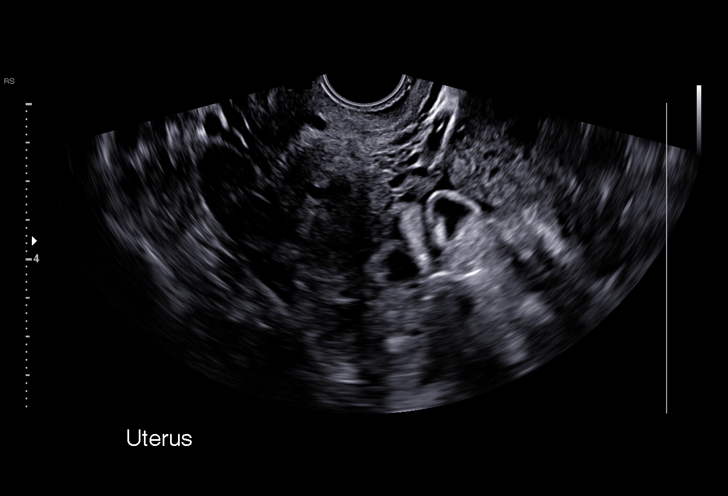
[im 51/66]
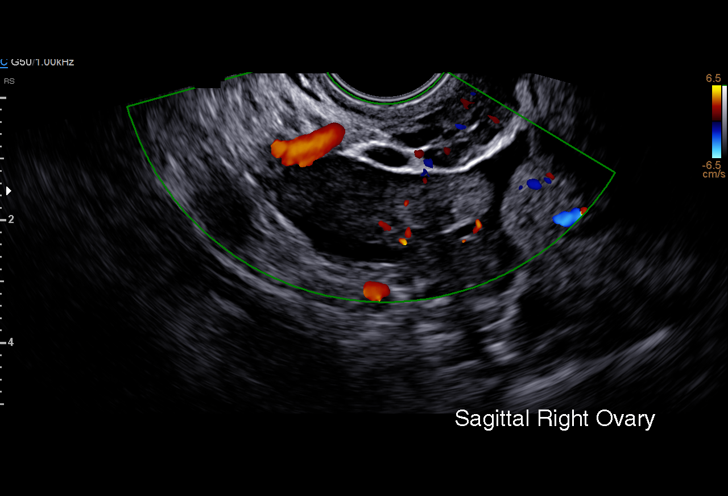
[im 56/66]
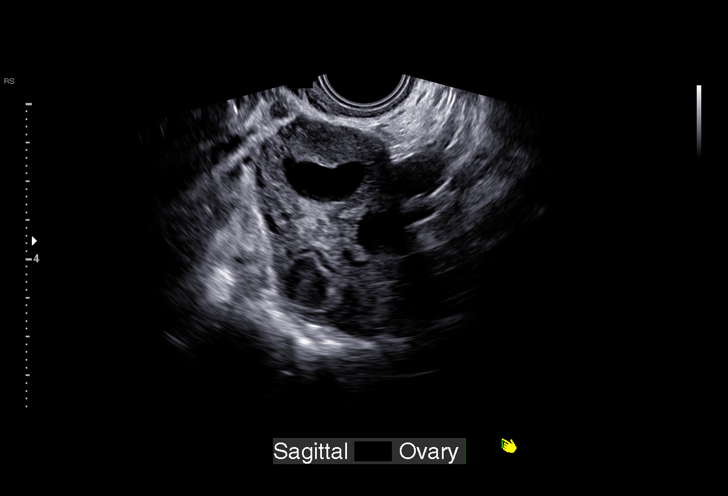
[im 61/66]
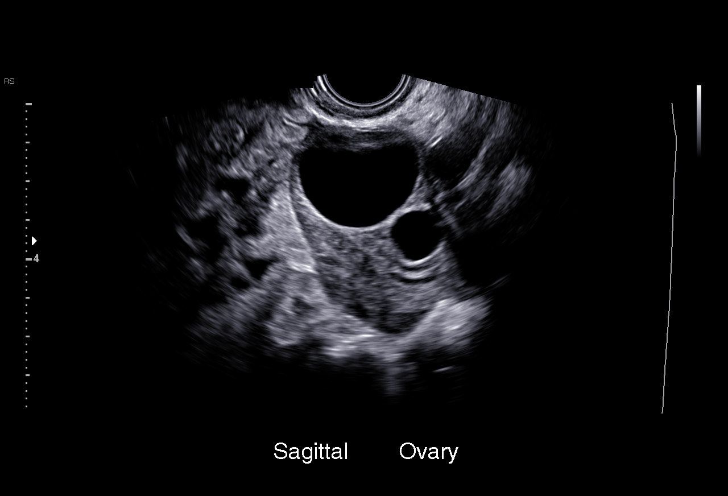
[im 66/66]
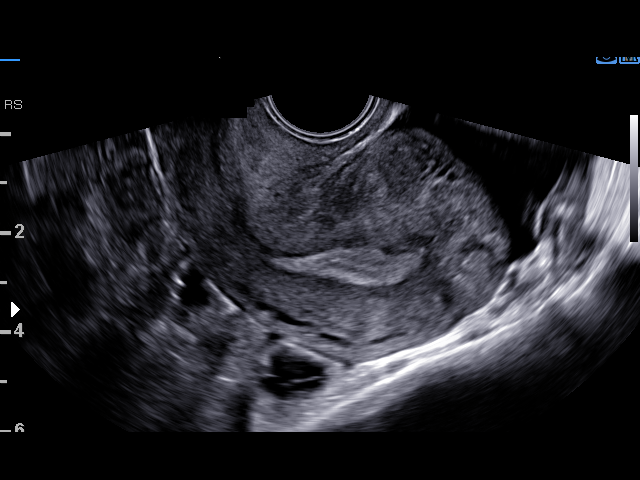

[15 of 28 positions shown; findings below may reference images not displayed]

FINDINGS: Intrauterine gestational sac: None

Subchorionic hemorrhage:  None visualized.

Maternal uterus/adnexae: Endometrial complex measures 7 mm.

Right ovary is within normal limits.

Left ovary is notable for a 3.0 cm simple cyst and a 2.0 cm presumed
corpus luteal cyst.

No free fluid.
IMPRESSION: No IUP is visualized.

This is not unexpected given the low beta HCG. However, by
definition, this reflects a pregnancy of unknown location.
Differential considerations include early normal IUP, abnormal
IUP/missed abortion, or nonvisualized ectopic pregnancy.

Serial beta HCG is suggested. Consider repeat pelvic ultrasound in
14 days.

## 2016-09-19 NOTE — MAU Note (Signed)
Urine in lab 

## 2016-09-19 NOTE — Discharge Instructions (Signed)

## 2016-09-19 NOTE — Telephone Encounter (Signed)
Patient took 4 pregnancy test yesterday and they all are positive.  She says she is having a lot of pelvic pain and swelling and painful intercourse.  Patient states she had miscarriage the beginning of June and wants to speak with someone about her symptoms.

## 2016-09-19 NOTE — MAU Note (Signed)
Went to OB this morning, had +HPT.  Had a pelvic exam, brownish d/c was noted.  Sent over here for blood work and an [place].  Has had a lot of pelvic pain over the last month.   Last period lasted 3 days, but was very light and painful

## 2016-09-19 NOTE — Telephone Encounter (Signed)
Spoke with patient. Advised as seen below per Dr. Quincy Simmonds, patient is agreeable to date and time. Patient added to schedule for 11/6 at 10:45am.    Routing to provider for final review. Patient is agreeable to disposition. Will close encounter.

## 2016-09-19 NOTE — Telephone Encounter (Signed)
Bring patient in now.

## 2016-09-19 NOTE — MAU Provider Note (Signed)
S:  Cristina Davenport is a 30 y.o. female G1P0101 @ [redacted]w[redacted]d here in MAU from the office for a beta hcg level and an [place]. She was seen today in the office for her pregnancy, she had an exam today and her Dr noted some brown discharge during her exam. Recent intercourse yesterday  Patient denies pain at this time. Patient denies bleeding at this time.    O:  GENERAL: Well-developed, well-nourished female in no acute distress.  LUNGS: Effort normal SKIN: Warm, dry and without erythema PSYCH: Normal mood and affect  Vitals:   09/19/16 1214  BP: 123/66  Pulse: 67   Results for orders placed or performed during the hospital encounter of 09/19/16 (from the past 48 hour(s))  hCG, quantitative, pregnancy     Status: Abnormal   Collection Time: 09/19/16 12:34 PM  Result Value Ref Range   hCG, Beta Chain, Quant, S 117 (H) <5 mIU/mL    Comment:          GEST. AGE      CONC.  (mIU/mL)   <=1 WEEK        5 - 50     2 WEEKS       50 - 500     3 WEEKS       100 - 10,000     4 WEEKS     1,000 - 30,000     5 WEEKS     3,500 - 115,000   6-8 WEEKS     12,000 - 270,000    12 WEEKS     15,000 - 220,000        FEMALE AND NON-PREGNANT FEMALE:     LESS THAN 5 mIU/mL   CBC     Status: None   Collection Time: 09/19/16 12:34 PM  Result Value Ref Range   WBC 9.0 4.0 - 10.5 K/uL   RBC 4.44 3.87 - 5.11 MIL/uL   Hemoglobin 13.3 12.0 - 15.0 g/dL   HCT 38.7 36.0 - 46.0 %   MCV 87.2 78.0 - 100.0 fL   MCH 30.0 26.0 - 34.0 pg   MCHC 34.4 30.0 - 36.0 g/dL   RDW 13.1 11.5 - 15.5 %   Platelets 245 150 - 400 K/uL    US Ob Comp Less 14 Wks  Result Date: 09/19/2016 CLINICAL DATA:  Pregnant, abdominal pain, vaginal bleeding/ spotting, beta HCG 117 EXAM: OBSTETRIC <14 WK [place] AND TRANSVAGINAL OB US TECHNIQUE: Both transabdominal and transvaginal ultrasound examinations were performed for complete evaluation of the gestation as well as the maternal uterus, adnexal regions, and pelvic cul-de-sac. Transvaginal technique  was performed to assess early pregnancy. COMPARISON:  None. FINDINGS: Intrauterine gestational sac: None Subchorionic hemorrhage:  None visualized. Maternal uterus/adnexae: Endometrial complex measures 7 mm. Right ovary is within normal limits. Left ovary is notable for a 3.0 cm simple cyst and a 2.0 cm presumed corpus luteal cyst. No free fluid. IMPRESSION: No IUP is visualized. This is not unexpected given the low beta HCG. However, by definition, this reflects a pregnancy of unknown location. Differential considerations include early normal IUP, abnormal IUP/missed abortion, or nonvisualized ectopic pregnancy. Serial beta HCG is suggested. Consider repeat pelvic ultrasound in 14 days. Electronically Signed   By: Julian Hy M.D.   On: 09/19/2016 16:23   US Ob Transvaginal  Result Date: 09/19/2016 CLINICAL DATA:  Pregnant, abdominal pain, vaginal bleeding/ spotting, beta HCG 117 EXAM: OBSTETRIC <14 WK [place] AND TRANSVAGINAL OB US TECHNIQUE: Both transabdominal  and transvaginal ultrasound examinations were performed for complete evaluation of the gestation as well as the maternal uterus, adnexal regions, and pelvic cul-de-sac. Transvaginal technique was performed to assess early pregnancy. COMPARISON:  None. FINDINGS: Intrauterine gestational sac: None Subchorionic hemorrhage:  None visualized. Maternal uterus/adnexae: Endometrial complex measures 7 mm. Right ovary is within normal limits. Left ovary is notable for a 3.0 cm simple cyst and a 2.0 cm presumed corpus luteal cyst. No free fluid. IMPRESSION: No IUP is visualized. This is not unexpected given the low beta HCG. However, by definition, this reflects a pregnancy of unknown location. Differential considerations include early normal IUP, abnormal IUP/missed abortion, or nonvisualized ectopic pregnancy. Serial beta HCG is suggested. Consider repeat pelvic ultrasound in 14 days. Electronically Signed   By: Julian Hy M.D.   On: 09/19/2016 16:23     MDM:    Beta hcg level [place] 1720: Discussed patient with Dr. Halford Decamp    A:  1. Pregnancy of unknown anatomic location   2. Abdominal pain in pregnancy, antepartum   3. Spotting affecting pregnancy in first trimester   4. Ovarian cyst affecting pregnancy in first trimester, antepartum     P:  Discharge home in stable condition Patient instructed to follow up in the office on Wednesday for a 48 hour quant. Ectopic precautions Return to MAU if symptoms worsen Discussed labs and [place] in detail with the patient.    Lezlie Lye, NP 09/19/2016 6:58 PM

## 2016-09-19 NOTE — Telephone Encounter (Signed)
Spoke with patient. Patient states LMP 08/24/16 last 3 days was painful, never hurt like that before, hurt to insert tampon. Patient reports painful intercourse 09/18/16. Denies any vaginal bleeding or discharge. Patient states has felt uncomfortable for the last week, clothes fitting tighter. Patient states she has take 4 UPT. 3 on 11/5 and 1 this morning- reports all are positive. Patient states she had a miscarriage in June 2017. Patient requesting appt with Melvia Heaps, CNM today. Advised patient Melvia Heaps, CNM out of the office today, can schedule with a covering provider, patient request Dr. Quincy Simmonds. Advised patient I would need to review scheduling with Dr. Quincy Simmonds and return call. Patient is agreeable.   Dr. Quincy Simmonds please advise on scheduling?   Cc: Melvia Heaps, CNM

## 2016-09-19 NOTE — Progress Notes (Signed)
GYNECOLOGY  VISIT   HPI: 30 y.o.   Divorced  Caucasian  female   G2P0101 with Patient's last menstrual period was 08/24/2016.   here for   +UPT with pelvic pain off and on since last menstrual  Menses last month was not normal. Felt swollen and felt really bad just like when she had her miscarriage in June.  Menses was light and lasted 3 - 4 days instead of the usual 7 days.  Intercourse in painful. Hurt to use a tampon with her last menses on 08/24/16.  No bleeding since her cycle on 08/24/16.   Had felt pain in either the right or left lower quadrant, more on the right.  Does not hurt today.  Gaining weight.   Had SAB in May 2017.  Passed spontaneously.  No surgery or tx.   Hx of PID in 2012.  Hx PTL and pre-eclampsia in her prior pregnancy. Delivery in 2009.  GYNECOLOGIC HISTORY: Patient's last menstrual period was 08/24/2016. Contraception:  none Menopausal hormone therapy:  n/a Last mammogram:  n/a Last pap smear:   09/23/15 ASCUS; HR HPV- not detected UPT: Positive        OB History    Gravida Para Term Preterm AB Living   2 1 0 1 0 1   SAB TAB Ectopic Multiple Live Births   0                 Patient Active Problem List   Diagnosis Date Noted  . Change in bowel habits 03/30/2016  . Constipation 03/14/2016  . Abdominal bloating 03/14/2016  . Migraine headache 03/12/2016  . Anxiety 04/17/2015  . Low grade squamous intraepithelial lesion on cytologic smear of cervix (LGSIL) 09/15/2014  . Adjustment disorder with anxious mood 09/14/2014  . Abdominal pain, epigastric 04/26/2013  . Nausea with vomiting 04/26/2013  . Gastritis 02/23/2013    Past Medical History:  Diagnosis Date  . Anxiety   . Anxiety   . Bipolar 1 disorder ([place])   . GERD (gastroesophageal reflux disease)   . IBS (irritable bowel syndrome)   . Migraine without aura     Past Surgical History:  Procedure Laterality Date  . ABDOMINAL SURGERY     cyst removed   . CHOLECYSTECTOMY N/A  07/10/2013   Procedure: LAPAROSCOPIC CHOLECYSTECTOMY;  Surgeon: Donato Heinz, MD;  Location: [facility];  Service: General;  Laterality: N/A;  . ESOPHAGOGASTRODUODENOSCOPY N/A 04/30/2013   [id number] distal esophagus of uncertain clinical significance-status post biopsy. Tiny hiatal hernia. No explanation  . EXAMINATION UNDER ANESTHESIA  04/21/2012   Gluteal wound repair    Current Outpatient Prescriptions  Medication Sig Dispense Refill  . clonazePAM (KLONOPIN) 0.5 MG tablet 1 bid prn 40 tablet 4  . escitalopram (LEXAPRO) 20 MG tablet TAKE 1 TABLET BY MOUTH ONCE DAILY 90 tablet PRN  . lubiprostone (AMITIZA) 24 MCG capsule Take 1 capsule (24 mcg total) by mouth 2 (two) times daily with a meal. (Patient not taking: Reported on 04/10/2016) 60 capsule 3  . metoCLOPramide (REGLAN) 10 MG tablet Take at the onset of headache. May take with tylenol and imitrex as well. (Patient not taking: Reported on 04/10/2016) 90 tablet 12  . Probiotic Product (ALIGN) 4 MG CAPS Take 1 capsule (4 mg total) by mouth daily. (Patient not taking: Reported on 04/10/2016) 14 capsule 0  . SUMAtriptan (IMITREX) 100 MG tablet One now with migraine May repeat in 2 hours if headache persists or recurs. (Patient not taking: Reported on 04/10/2016)  10 tablet 2  . Topiramate ER (TROKENDI XR) 100 MG CP24 Take 100 mg by mouth at bedtime. (Patient not taking: Reported on 04/10/2016) 30 capsule 12  . traMADol (ULTRAM) 50 MG tablet Take 1 tablet (50 mg total) by mouth every 6 (six) hours as needed. 30 tablet 0  . traZODone (DESYREL) 50 MG tablet One po qhs 90 tablet 1  . valACYclovir (VALTREX) 1000 MG tablet Take 1 tablet (1,000 mg total) by mouth 2 (two) times daily. 20 tablet 0   No current facility-administered medications for this visit.      ALLERGIES: Doxycycline and Latex  Family History  Problem Relation Age of Onset  . Hypertension Maternal Grandmother   . Stroke Maternal Grandmother   . Hypertension Maternal Grandfather   .  Stroke Maternal Grandfather   . Crohn's disease Maternal Aunt   . Crohn's disease Cousin   . Colon cancer Neg Hx   . Mental illness Mother     Social History   Social History  . Marital status: Divorced    Spouse name: N/A  . Number of children: 1  . Years of education: 44   Occupational History  . [employer]    Social History Main Topics  . Smoking status: Former Smoker    Packs/day: 0.00    Years: 8.00    Quit date: 09/19/2014  . Smokeless tobacco: Not on file  . Alcohol use 0.0 oz/week     Comment: occasionally,  social  . Drug use: No  . Sexual activity: Yes    Partners: Male    Birth control/ protection: None   Other Topics Concern  . 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  PHYSICAL EXAMINATION:    BP 102/68 (BP Location: Right Arm, Patient Position: Sitting, Cuff Size: Normal)   Pulse 76   Resp 16   Ht 5\' 1"  (1.549 m)   Wt 148 lb 6.4 oz (67.3 kg)   LMP 08/24/2016   BMI 28.04 kg/m     General appearance: alert, cooperative and appears stated age    Pelvic: External genitalia:  no lesions              Urethra:  normal appearing urethra with no masses, tenderness or lesions              Bartholins and Skenes: normal                 Vagina: normal appearing vagina with normal color and discharge, no lesions              Cervix: no lesions. Small amount of brown drainage.  Blood?                Bimanual Exam:  Uterus:  normal size, contour, position, consistency, mobility, non-tender, retroverted.              Adnexa: no mass, fullness, tenderness                Chaperone was present for exam.  ASSESSMENT  Early pregnancy.  Hx SAB. Pelvic pain.  Hx PID.   PLAN  Start PNV.  To [facility] for quant beta hCG and pelvic ultrasound.  We discussed potential risk of ectopic pregnancy.    An After Visit Summary was printed and  given to the patient.  __15____ minutes face to face time of which over 50% was spent in counseling.

## 2016-09-20 ENCOUNTER — Telehealth: Payer: Self-pay | Admitting: Family Medicine

## 2016-09-20 ENCOUNTER — Other Ambulatory Visit: Payer: 59

## 2016-09-20 ENCOUNTER — Encounter: Payer: Self-pay | Admitting: Nurse Practitioner

## 2016-09-20 ENCOUNTER — Encounter: Payer: Self-pay | Admitting: Obstetrics and Gynecology

## 2016-09-20 ENCOUNTER — Telehealth: Payer: Self-pay | Admitting: Obstetrics & Gynecology

## 2016-09-20 NOTE — Telephone Encounter (Signed)
#  1 congratulations on being pregnant #2 her Epic list does not show that she is taking Lexapro currently? Please clarify this? #3 her medication history shows Lexapro 20 mg. It is advisable for this patient to be off of antidepressants while pregnant. I would recommend tapering off of Lexapro she is currently on 20 mg she should go to half tablet for 3 days then stop the medication. She should schedule a follow-up office visit with Hoyle Sauer to discuss mental health issues and how to handle those during pregnancy.

## 2016-09-20 NOTE — Telephone Encounter (Signed)
Advised patient #1 congratulations on being pregnant #2 her Epic list does not show that she is taking Lexapro currently? Please clarify this? #3 her medication history shows Lexapro 20 mg. It is advisable for this patient to be off of antidepressants while pregnant. Dr Nicki Reaper would recommend tapering off of Lexapro she is currently on 20 mg she should go to half tablet for 3 days then stop the medication. She should schedule a follow-up office visit with Hoyle Sauer to discuss mental health issues and how to handle those during pregnancy. Patient states she has been on the Lexapro 20 mg it was removed by her OB when she found out she was pregnant. Patient verbalized understanding and will taper as advised and discuss mental health with her OB.

## 2016-09-20 NOTE — Telephone Encounter (Signed)
Patient sent a my chart message to carolyn this am but she wont be in office until tomorrow

## 2016-09-20 NOTE — Telephone Encounter (Signed)
Lexapro does not appear to be associated with birth defects or miscarriage.  It is associated with increased risk of post partum hemorrhage.  It lis less studied than some of the other SSRIs. Data available through Up to Date.  She will need to decide with her prescriber if she will continue on this medication during pregnancy or not.

## 2016-09-20 NOTE — Telephone Encounter (Signed)
Spoke with patient. Patient states that she spoke with Dr.Silva following her visit at [facility] after her OV with Dr.Silva yesterday. States she was advised by Dr.Silva that she will need a repeat HCG level on 09/21/2016. Lab appointment scheduled for 09/21/2016 at 11:40 am. Patient is agreeable to date and time. Patient reports she is doing well today. Denies any pain, cramping, or bleeding at this time. Reports she feels well today with only slight nausea. Denies any vomiting. Patient states she was advised she will need to discontinue her Lexapro 20 mg by the physician at [facility] recommendation on how to discontinue this medication and if there are any alternatives that would be safe to take. Advised I will speak with Dr.Silva and return call with further recommendations. Patient is agreeable.

## 2016-09-20 NOTE — Telephone Encounter (Signed)
Patient said she was told to call today to follow up with a nurse.

## 2016-09-20 NOTE — Telephone Encounter (Signed)
Spoke with patient. Advised of message as seen below from [place]. Patient is agreeable and verbalizes understanding. Precautions given as seen below from [place]. Patient verbalizes understanding.  Nunzio Cobbs, MD  P [facility] Triage Pool  Cc: Lowella Fairy, CMA        Please contact patient to have her return to the office on 09/21/16 in am for a quant beta hCG.  This will tell us how the early pregnancy is developing.   She was seen yesterday after having positive home UPTs.  She reported pelvic pain.  I sent her to [facility] for evaluation.  Her blood testing in the hospital was positive.  She had an ultrasound showing two presumed cysts of the left ovary.  No IUP seen yet.  (I am having staff confirm if the UPT here was positive or negative.)   Patient needs ectopic precautions.  No intercourse.  Call for bleeding or increased pain.   Once her beta hCG is about 2000, we should be able to see an IUP.   [place] to provider for final review. Patient agreeable to disposition. Will close encounter.

## 2016-09-20 NOTE — Telephone Encounter (Signed)
Patient said she found out she was pregnant on Sunday.  She has had recent miscarriages and her OBGYN told [facility] to go [facility].  [facility] told her that she needed to stop taking her Lexapro and to get in touch with Hoyle Sauer regarding quitting this medication.  Please advise.

## 2016-09-20 NOTE — Addendum Note (Signed)
Addended by: Yisroel Ramming, Dietrich Pates E on: 09/20/2016 07:53 AM   Modules accepted: Orders

## 2016-09-21 ENCOUNTER — Other Ambulatory Visit: Payer: Self-pay | Admitting: Obstetrics and Gynecology

## 2016-09-21 ENCOUNTER — Telehealth: Payer: Self-pay

## 2016-09-21 ENCOUNTER — Other Ambulatory Visit (INDEPENDENT_AMBULATORY_CARE_PROVIDER_SITE_OTHER): Payer: 59

## 2016-09-21 DIAGNOSIS — Z3201 Encounter for pregnancy test, result positive: Secondary | ICD-10-CM

## 2016-09-21 DIAGNOSIS — Z3490 Encounter for supervision of normal pregnancy, unspecified, unspecified trimester: Secondary | ICD-10-CM

## 2016-09-21 DIAGNOSIS — N912 Amenorrhea, unspecified: Secondary | ICD-10-CM

## 2016-09-21 DIAGNOSIS — M25559 Pain in unspecified hip: Secondary | ICD-10-CM

## 2016-09-21 LAB — HCG, QUANTITATIVE, PREGNANCY: hCG, Beta Chain, Quant, S: 330.2 m[IU]/mL — ABNORMAL HIGH

## 2016-09-21 NOTE — Telephone Encounter (Signed)
-----   Message from Nunzio Cobbs, MD sent at 09/21/2016  3:23 PM EST -----  Results to patient through My Chart. Hi Cristina Davenport,   The beta hCG rose beautifully!  We will plan to do a pelvic ultrasound in the office in November 14!  The nurse will call to be sure you receive this message and to facilitate for the ultrasound appointment.  I will be out of the office, but another provider will see you that day.   I am excited for you.   Josefa Half, MD

## 2016-09-21 NOTE — Telephone Encounter (Signed)
Spoke with patient. Results given as seen below. Patient is agreeable and verbalizes understanding. Appointment for viability ultrasound scheduled for 09/29/2016 at 8:30 am with 9 am consult with Dr.Jertson. Patient is agreeable to date and time. Order placed for precert.  Cc: Dr.Jertson  Routing to provider for final review. Patient agreeable to disposition. Will close encounter.

## 2016-09-29 ENCOUNTER — Ambulatory Visit (INDEPENDENT_AMBULATORY_CARE_PROVIDER_SITE_OTHER): Payer: 59

## 2016-09-29 ENCOUNTER — Other Ambulatory Visit: Payer: 59

## 2016-09-29 ENCOUNTER — Other Ambulatory Visit: Payer: 59 | Admitting: Obstetrics and Gynecology

## 2016-09-29 ENCOUNTER — Ambulatory Visit (INDEPENDENT_AMBULATORY_CARE_PROVIDER_SITE_OTHER): Payer: 59 | Admitting: Obstetrics and Gynecology

## 2016-09-29 ENCOUNTER — Encounter: Payer: Self-pay | Admitting: Obstetrics and Gynecology

## 2016-09-29 VITALS — BP 112/60 | HR 76 | Resp 14 | Ht 61.0 in | Wt 145.0 lb

## 2016-09-29 DIAGNOSIS — N912 Amenorrhea, unspecified: Secondary | ICD-10-CM | POA: Diagnosis not present

## 2016-09-29 DIAGNOSIS — Z3490 Encounter for supervision of normal pregnancy, unspecified, unspecified trimester: Secondary | ICD-10-CM

## 2016-09-29 DIAGNOSIS — Z3201 Encounter for pregnancy test, result positive: Secondary | ICD-10-CM

## 2016-09-29 DIAGNOSIS — F418 Other specified anxiety disorders: Secondary | ICD-10-CM

## 2016-09-29 DIAGNOSIS — Z3491 Encounter for supervision of normal pregnancy, unspecified, first trimester: Secondary | ICD-10-CM

## 2016-09-29 NOTE — Progress Notes (Signed)
GYNECOLOGY  VISIT   HPI: 30 y.o.   Single  Caucasian  female   G3P0111 with Patient's last menstrual period was 08/24/2016.   here for   Pelvic US. Last cycle was on time, but lighter, shorter and cramping. She had a recent SAB, has had some cramping this month, no further spotting (since last week). She had a complicated pregnancy with her daughter, delivered at 3 weeks for pre-eclampsia.  She was told to stop her lexapro about a week ago at the hospital. She tried stopping it, mood changes, dizzy, just doesn't feel well. She was then told to wean off of it.    GYNECOLOGIC HISTORY: Patient's last menstrual period was 08/24/2016. Contraception: None Menopausal hormone therapy: None        OB History    Gravida Para Term Preterm AB Living   3 1 0 1 1 1    SAB TAB Ectopic Multiple Live Births   0 0 0 0 1         Patient Active Problem List   Diagnosis Date Noted  . Change in bowel habits 03/30/2016  . Constipation 03/14/2016  . Abdominal bloating 03/14/2016  . Migraine headache 03/12/2016  . Anxiety 04/17/2015  . Low grade squamous intraepithelial lesion on cytologic smear of cervix (LGSIL) 09/15/2014  . Adjustment disorder with anxious mood 09/14/2014  . Abdominal pain, epigastric 04/26/2013  . Nausea with vomiting 04/26/2013  . Gastritis 02/23/2013    Past Medical History:  Diagnosis Date  . Anxiety   . Anxiety   . Bipolar 1 disorder ([place])   . GERD (gastroesophageal reflux disease)   . IBS (irritable bowel syndrome)   . Migraine without aura     Past Surgical History:  Procedure Laterality Date  . ABDOMINAL SURGERY     cyst removed   . CHOLECYSTECTOMY N/A 07/10/2013   Procedure: LAPAROSCOPIC CHOLECYSTECTOMY;  Surgeon: Donato Heinz, MD;  Location: [facility];  Service: General;  Laterality: N/A;  . ESOPHAGOGASTRODUODENOSCOPY N/A 04/30/2013   [id number] distal esophagus of uncertain clinical significance-status post biopsy. Tiny hiatal hernia. No explanation  .  EXAMINATION UNDER ANESTHESIA  04/21/2012   Gluteal wound repair    Current Outpatient Prescriptions  Medication Sig Dispense Refill  . escitalopram (LEXAPRO) 10 MG tablet Take 5 mg by mouth every other day.    . Prenatal Vit-Fe Fumarate-FA (PRENATAL MULTIVITAMIN) TABS tablet Take 1 tablet by mouth daily at 12 noon.     No current facility-administered medications for this visit.      ALLERGIES: Doxycycline and Latex  Family History  Problem Relation Age of Onset  . Mental illness Mother   . Hypertension Maternal Grandmother   . Stroke Maternal Grandmother   . Hypertension Maternal Grandfather   . Stroke Maternal Grandfather   . Crohn's disease Maternal Aunt   . Crohn's disease Cousin   . Colon cancer Neg Hx     Social History   Social History  . Marital status: Single    Spouse name: N/A  . Number of children: 1  . Years of education: 31   Occupational History  . [employer]    Social History Main Topics  . Smoking status: Former Smoker    Packs/day: 0.00    Years: 8.00    Quit date: 09/19/2014  . Smokeless tobacco: Never Used  . Alcohol use No     Comment: occasionally,  social  . Drug use: No  . Sexual activity: Yes    Partners: Male  Birth control/ protection: None   Other Topics Concern  . Not on file   Social History Narrative   Lives with 14 yr old daughter and finance   Caffeine use: none    Review of Systems  Constitutional:       Weight gain   HENT: Negative.   Eyes: Negative.   Respiratory: Negative.   Cardiovascular: Negative.   Gastrointestinal:       Bloating  Genitourinary:       Breast pain   Musculoskeletal: Negative.   Skin: Negative.   Neurological: Negative.   Endo/Heme/Allergies: Negative.   Psychiatric/Behavioral: Negative.     PHYSICAL EXAMINATION:    BP 112/60 (BP Location: Right Arm, Patient Position: Sitting, Cuff Size: Normal)   Pulse 76   Resp 14   Ht 5\' 1"  (1.549 m)   Wt 145 lb (65.8 kg)   LMP  08/24/2016   BMI 27.40 kg/m     General appearance: alert, cooperative and appears stated age  Ultrasound images reviewed with the patient  ASSESSMENT Early pregnancy H/O depression/anxiety, tried to wean off of Lexapro, doesn't feel well     PLAN Early IUP Recommended she continue on Lexapro, reviewed the information in UTD about depression, SSRI's in pregnancy and specifically lexapro. Lexapro is not as well studied as some other SSRI's, no risk of SAB or teratogenic effects, may have slightly increased risk of post partum hemorrhage (similar to Celexa).  She will try the lexapro 10 mg a day (was on 20, but as she was weaning off on 10), if 10 not working would increase to 20 mg She will further discuss with her OB BT A+ Discussed exercise in pregnancy   An After Visit Summary was printed and given to the patient.  25 minutes face to face time of which over 50% was spent in counseling.

## 2016-10-12 DIAGNOSIS — N911 Secondary amenorrhea: Secondary | ICD-10-CM | POA: Diagnosis not present

## 2016-10-17 ENCOUNTER — Telehealth: Payer: Self-pay | Admitting: *Deleted

## 2016-10-17 NOTE — Telephone Encounter (Signed)
Patient in 08 recall for 09/2016. Patient is currently pregnant. Please advise on recall status Thanks Margaretha Sheffield

## 2016-10-18 NOTE — Telephone Encounter (Signed)
Removed from recall -eh 

## 2016-10-18 NOTE — Telephone Encounter (Signed)
OK to remove from recall. 

## 2016-10-19 DIAGNOSIS — Z3481 Encounter for supervision of other normal pregnancy, first trimester: Secondary | ICD-10-CM | POA: Diagnosis not present

## 2016-10-19 DIAGNOSIS — Z3482 Encounter for supervision of other normal pregnancy, second trimester: Secondary | ICD-10-CM | POA: Diagnosis not present

## 2016-10-19 LAB — OB RESULTS CONSOLE ANTIBODY SCREEN: Antibody Screen: NEGATIVE

## 2016-10-19 LAB — OB RESULTS CONSOLE HIV ANTIBODY (ROUTINE TESTING): HIV: NONREACTIVE

## 2016-10-19 LAB — OB RESULTS CONSOLE HEPATITIS B SURFACE ANTIGEN: Hepatitis B Surface Ag: NEGATIVE

## 2016-10-19 LAB — OB RESULTS CONSOLE RUBELLA ANTIBODY, IGM: Rubella: IMMUNE

## 2016-10-19 LAB — OB RESULTS CONSOLE ABO/RH: RH Type: POSITIVE

## 2016-10-19 LAB — OB RESULTS CONSOLE RPR: RPR: NONREACTIVE

## 2016-11-04 DIAGNOSIS — Z113 Encounter for screening for infections with a predominantly sexual mode of transmission: Secondary | ICD-10-CM | POA: Diagnosis not present

## 2016-11-04 DIAGNOSIS — Z3A10 10 weeks gestation of pregnancy: Secondary | ICD-10-CM | POA: Diagnosis not present

## 2016-11-04 DIAGNOSIS — Z3480 Encounter for supervision of other normal pregnancy, unspecified trimester: Secondary | ICD-10-CM | POA: Diagnosis not present

## 2016-11-04 DIAGNOSIS — Z3401 Encounter for supervision of normal first pregnancy, first trimester: Secondary | ICD-10-CM | POA: Diagnosis not present

## 2016-11-04 LAB — OB RESULTS CONSOLE GC/CHLAMYDIA
Chlamydia: NEGATIVE
Gonorrhea: NEGATIVE

## 2016-11-14 NOTE — L&D Delivery Note (Signed)
Patient is 31 y.o. [id number] [redacted]w[redacted]d admitted in active labor   Delivery Note At 1:04 PM a viable female was delivered via Vaginal, Spontaneous Delivery (Presentation: OA ).  APGAR: 8, 9; weight 6 lb 6.5 oz (2905 g).   Placenta status: Spontaneous, intact. [place] Cord.  Cord pH: not collected  Anesthesia:  Epidural Episiotomy: None Lacerations: None Suture Repair: na Est. Blood Loss (mL): 200   Mom to postpartum.  Baby to Couplet care / Skin to Skin.  Juanita Craver [facility] 05/17/2017, 4:31 PM

## 2016-11-16 DIAGNOSIS — Z3682 Encounter for antenatal screening for nuchal translucency: Secondary | ICD-10-CM | POA: Diagnosis not present

## 2016-11-16 DIAGNOSIS — Z3491 Encounter for supervision of normal pregnancy, unspecified, first trimester: Secondary | ICD-10-CM | POA: Diagnosis not present

## 2016-11-16 DIAGNOSIS — Z36 Encounter for antenatal screening for chromosomal anomalies: Secondary | ICD-10-CM | POA: Diagnosis not present

## 2016-12-16 ENCOUNTER — Inpatient Hospital Stay (HOSPITAL_COMMUNITY)
Admission: AD | Admit: 2016-12-16 | Discharge: 2016-12-16 | Disposition: A | Discharge status: Home / Self Care | Payer: 59 | Source: Ambulatory Visit | Attending: Obstetrics and Gynecology | Admitting: Obstetrics and Gynecology

## 2016-12-16 ENCOUNTER — Encounter (HOSPITAL_COMMUNITY): Payer: Self-pay | Admitting: *Deleted

## 2016-12-16 DIAGNOSIS — Z888 Allergy status to other drugs, medicaments and biological substances status: Secondary | ICD-10-CM | POA: Diagnosis not present

## 2016-12-16 DIAGNOSIS — O99612 Diseases of the digestive system complicating pregnancy, second trimester: Secondary | ICD-10-CM | POA: Diagnosis not present

## 2016-12-16 DIAGNOSIS — Z9049 Acquired absence of other specified parts of digestive tract: Secondary | ICD-10-CM | POA: Insufficient documentation

## 2016-12-16 DIAGNOSIS — O26892 Other specified pregnancy related conditions, second trimester: Secondary | ICD-10-CM | POA: Diagnosis not present

## 2016-12-16 DIAGNOSIS — Z79899 Other long term (current) drug therapy: Secondary | ICD-10-CM | POA: Insufficient documentation

## 2016-12-16 DIAGNOSIS — Z800 Family history of malignant neoplasm of digestive organs: Secondary | ICD-10-CM | POA: Insufficient documentation

## 2016-12-16 DIAGNOSIS — K589 Irritable bowel syndrome without diarrhea: Secondary | ICD-10-CM | POA: Insufficient documentation

## 2016-12-16 DIAGNOSIS — Z8249 Family history of ischemic heart disease and other diseases of the circulatory system: Secondary | ICD-10-CM | POA: Diagnosis not present

## 2016-12-16 DIAGNOSIS — O219 Vomiting of pregnancy, unspecified: Secondary | ICD-10-CM | POA: Diagnosis not present

## 2016-12-16 DIAGNOSIS — E860 Dehydration: Secondary | ICD-10-CM | POA: Insufficient documentation

## 2016-12-16 DIAGNOSIS — Z9889 Other specified postprocedural states: Secondary | ICD-10-CM | POA: Diagnosis not present

## 2016-12-16 DIAGNOSIS — Z87891 Personal history of nicotine dependence: Secondary | ICD-10-CM | POA: Insufficient documentation

## 2016-12-16 DIAGNOSIS — R109 Unspecified abdominal pain: Secondary | ICD-10-CM | POA: Diagnosis not present

## 2016-12-16 DIAGNOSIS — Z3A17 17 weeks gestation of pregnancy: Secondary | ICD-10-CM | POA: Diagnosis not present

## 2016-12-16 DIAGNOSIS — O99342 Other mental disorders complicating pregnancy, second trimester: Secondary | ICD-10-CM | POA: Insufficient documentation

## 2016-12-16 DIAGNOSIS — F419 Anxiety disorder, unspecified: Secondary | ICD-10-CM | POA: Insufficient documentation

## 2016-12-16 DIAGNOSIS — F319 Bipolar disorder, unspecified: Secondary | ICD-10-CM | POA: Diagnosis not present

## 2016-12-16 DIAGNOSIS — K219 Gastro-esophageal reflux disease without esophagitis: Secondary | ICD-10-CM | POA: Diagnosis not present

## 2016-12-16 DIAGNOSIS — Z823 Family history of stroke: Secondary | ICD-10-CM | POA: Insufficient documentation

## 2016-12-16 LAB — URINALYSIS, COMPLETE (UACMP) WITH MICROSCOPIC
Bilirubin Urine: NEGATIVE
Glucose, UA: 50 mg/dL — AB
Hgb urine dipstick: NEGATIVE
Ketones, ur: NEGATIVE mg/dL
Leukocytes, UA: NEGATIVE
Nitrite: NEGATIVE
Protein, ur: NEGATIVE mg/dL
Specific Gravity, Urine: 1.001 — ABNORMAL LOW (ref 1.005–1.030)
pH: 7 (ref 5.0–8.0)

## 2016-12-16 LAB — URINALYSIS, ROUTINE W REFLEX MICROSCOPIC
Bilirubin Urine: NEGATIVE
Glucose, UA: NEGATIVE mg/dL
Hgb urine dipstick: NEGATIVE
Ketones, ur: 80 mg/dL — AB
Leukocytes, UA: NEGATIVE
Nitrite: NEGATIVE
Protein, ur: NEGATIVE mg/dL
Specific Gravity, Urine: 1.019 (ref 1.005–1.030)
pH: 5 (ref 5.0–8.0)

## 2016-12-16 MED ORDER — SODIUM CHLORIDE 0.9 % IV SOLN
INTRAVENOUS | Status: DC
  Filled 2016-12-16 (×2): qty 1000

## 2016-12-16 MED ORDER — SODIUM CHLORIDE 0.9 % IV SOLN
25.0000 mg | Freq: Once | INTRAVENOUS | Status: DC
  Filled 2016-12-16: qty 1

## 2016-12-16 NOTE — MAU Provider Note (Signed)
Patient Cristina Davenport is a [id number] at 16 weeks and 2 days here with complaints of abdominal pain that started a week ago but that worsened today. She has a history of pre-term labor and pre-eclampsia in her first pregnancy, although she carried her first pregnancy to term.  History     CSN: [id number]  Arrival date and time: 12/16/16 1549   None     Chief Complaint  Patient presents with  . Abdominal Pain  . Emesis During Pregnancy   Abdominal Pain  This is a recurrent problem. The current episode started in the past 7 days. The onset quality is sudden. The problem occurs intermittently. The problem has been gradually worsening. The pain is located in the suprapubic region and periumbilical region. The pain is at a severity of 5/10. The pain is moderate. The quality of the pain is cramping. The abdominal pain does not radiate. Associated symptoms include constipation, headaches and nausea. Pertinent negatives include no anorexia, arthralgias, belching, diarrhea, dysuria, fever, flatus, frequency, hematochezia, hematuria, melena, myalgias, vomiting or weight loss. Nothing aggravates the pain. The pain is relieved by nothing. She has tried nothing for the symptoms.   Patient has been having contractions off and on for a week but today she noticed that her stomach was balling up on one side and then on the other. She denies bleeding, leaking of fluid, dysuria or vaginal discharge.  OB History    Gravida Para Term Preterm AB Living   3 1 0 1 1 1    SAB TAB Ectopic Multiple Live Births   1 0 0 0 1      Past Medical History:  Diagnosis Date  . Anxiety   . Anxiety   . Bipolar 1 disorder ([place])   . GERD (gastroesophageal reflux disease)   . IBS (irritable bowel syndrome)   . Migraine without aura     Past Surgical History:  Procedure Laterality Date  . ABDOMINAL SURGERY     cyst removed   . CHOLECYSTECTOMY N/A 07/10/2013   Procedure: LAPAROSCOPIC CHOLECYSTECTOMY;  Surgeon: Donato Heinz, MD;  Location: [facility];  Service: General;  Laterality: N/A;  . ESOPHAGOGASTRODUODENOSCOPY N/A 04/30/2013   [id number] distal esophagus of uncertain clinical significance-status post biopsy. Tiny hiatal hernia. No explanation  . EXAMINATION UNDER ANESTHESIA  04/21/2012   Gluteal wound repair    Family History  Problem Relation Age of Onset  . Mental illness Mother   . Hypertension Maternal Grandmother   . Stroke Maternal Grandmother   . Hypertension Maternal Grandfather   . Stroke Maternal Grandfather   . Crohn's disease Maternal Aunt   . Crohn's disease Cousin   . Colon cancer Neg Hx     Social History  Substance Use Topics  . Smoking status: Former Smoker    Packs/day: 0.00    Years: 8.00    Quit date: 09/19/2014  . Smokeless tobacco: Never Used  . Alcohol use No     Comment: occasionally,  social    Allergies:  Allergies  Allergen Reactions  . Doxycycline     Solar rash  . Latex Other (See Comments)    General irritation    Prescriptions Prior to Admission  Medication Sig Dispense Refill Last Dose  . calcium carbonate (TUMS - DOSED IN MG ELEMENTAL CALCIUM) 500 MG chewable tablet Chew 1 tablet by mouth 4 (four) times daily as needed for indigestion or heartburn.   12/15/2016 at Unknown time  . clindamycin (CLEOCIN T) 1 %  lotion Apply 1 application topically 2 (two) times daily.  1 12/16/2016 at Unknown time  . DICLEGIS 10-10 MG TBEC Take 2 tablets by mouth at bedtime.   0 12/16/2016 at Unknown time  . escitalopram (LEXAPRO) 20 MG tablet Take 20 mg by mouth daily.  6 12/16/2016 at Unknown time  . Prenatal Vit-Fe Fumarate-FA (PRENATAL MULTIVITAMIN) TABS tablet Take 1 tablet by mouth daily at 12 noon.   12/15/2016 at Unknown time  . valACYclovir (VALTREX) 1000 MG tablet Take 1 tablet by mouth daily as needed (cold sores).    summer at Unknown time    Review of Systems  Constitutional: Negative for fever and weight loss.  HENT: Negative.   Eyes: Negative.   Respiratory:  Negative.   Cardiovascular: Negative.   Gastrointestinal: Positive for abdominal pain, constipation and nausea. Negative for anorexia, diarrhea, flatus, hematochezia, melena and vomiting.  Endocrine: Negative.   Genitourinary: Negative for dysuria, frequency and hematuria.  Musculoskeletal: Negative for arthralgias and myalgias.  Skin: Negative.   Neurological: Positive for headaches.       Has headaches but are relieved with tylenol  Hematological: Negative.   Psychiatric/Behavioral: Negative.    Physical Exam   Blood pressure 110/67, pulse 105, temperature 98 F (36.7 C), temperature source Oral, resp. rate 18, height 5\' 1"  (1.549 m), weight 165 lb (74.8 kg), last menstrual period 08/24/2016.  Physical Exam  Constitutional: She is oriented to person, place, and time. She appears well-developed.  HENT:  Head: Normocephalic.  Eyes: Pupils are equal, round, and reactive to light.  Neck: Normal range of motion. Neck supple.  Respiratory: Effort normal and breath sounds normal.  GI: Soft. Bowel sounds are normal.  Genitourinary: No vaginal discharge found.  Genitourinary Comments: Cervix is closed, long and posterior.   Musculoskeletal: Normal range of motion.  Neurological: She is alert and oriented to person, place, and time.  Skin: Skin is warm and dry.  Psychiatric: She has a normal mood and affect.    MAU Course  Procedures  MDM -UA: shows 80 of ketones -PO hydration> ketones now absent from urine -FHR is 145 -Cervix is long, closed and posterior  Assessment and Plan   1. Dehydration    2. Patient to be discharged with instructions on maintaining adequate hydration in pregnancy and normalcy of Braxton hicks contractions. Patient verbalized understanding of when to return to MAU (leaking of fluid, bleeding, contractions that increase in intensity or frequency even after hydration and rest) 3. Plan of care discussed with Dr. Julien Girt, who agrees.   Mervyn Skeeters  Daziah Hesler 12/16/2016, 4:43 PM

## 2016-12-16 NOTE — MAU Note (Signed)
Pt has had a lot of vomiting today, has had morning sickness the whole pregnancy.  Today is having abd tightness, noted one area where her abdomen was very "balled up."  Having dull cramps, denies bleeding.

## 2016-12-16 NOTE — Discharge Instructions (Signed)
Dehydration, Adult Dehydration is a condition in which there is not enough fluid or water in the body. This happens when you lose more fluids than you take in. Important organs, such as the kidneys, brain, and heart, cannot function without a proper amount of fluids. Any loss of fluids from the body can lead to dehydration. Dehydration can range from mild to severe. This condition should be treated right away to prevent it from becoming severe. What are the causes? This condition may be caused by:  Vomiting.  Diarrhea.  Excessive sweating, such as from heat exposure or exercise.  Not drinking enough fluid, especially:  When ill.  While doing activity that requires a lot of energy.  Excessive urination.  Fever.  Infection.  Certain medicines, such as medicines that cause the body to lose excess fluid (diuretics).  Inability to access safe drinking water.  Reduced physical ability to get adequate water and food. What increases the risk? This condition is more likely to develop in people:  Who have a poorly controlled long-term (chronic) illness, such as diabetes, heart disease, or kidney disease.  Who are age 65 or older.  Who are disabled.  Who live in a place with high altitude.  Who play endurance sports. What are the signs or symptoms? Symptoms of mild dehydration may include:   Thirst.  Dry lips.  Slightly dry mouth.  Dry, warm skin.  Dizziness. Symptoms of moderate dehydration may include:   Very dry mouth.  Muscle cramps.  Dark urine. Urine may be the color of tea.  Decreased urine production.  Decreased tear production.  Heartbeat that is irregular or faster than normal (palpitations).  Headache.  Light-headedness, especially when you stand up from a sitting position.  Fainting (syncope). Symptoms of severe dehydration may include:   Changes in skin, such as:  Cold and clammy skin.  Blotchy (mottled) or pale skin.  Skin that does  not quickly return to normal after being lightly pinched and released (poor skin turgor).  Changes in body fluids, such as:  Extreme thirst.  No tear production.  Inability to sweat when body temperature is high, such as in hot weather.  Very little urine production.  Changes in vital signs, such as:  Weak pulse.  Pulse that is more than 100 beats a minute when sitting still.  Rapid breathing.  Low blood pressure.  Other changes, such as:  Sunken eyes.  Cold hands and feet.  Confusion.  Lack of energy (lethargy).  Difficulty waking up from sleep.  Short-term weight loss.  Unconsciousness. How is this diagnosed? This condition is diagnosed based on your symptoms and a physical exam. Blood and urine tests may be done to help confirm the diagnosis. How is this treated? Treatment for this condition depends on the severity. Mild or moderate dehydration can often be treated at home. Treatment should be started right away. Do not wait until dehydration becomes severe. Severe dehydration is an emergency and it needs to be treated in a hospital. Treatment for mild dehydration may include:   Drinking more fluids.  Replacing salts and minerals in your blood (electrolytes) that you may have lost. Treatment for moderate dehydration may include:   Drinking an oral rehydration solution (ORS). This is a drink that helps you replace fluids and electrolytes (rehydrate). It can be found at pharmacies and retail stores. Treatment for severe dehydration may include:   Receiving fluids through an IV tube.  Receiving an electrolyte solution through a feeding tube that is   passed through your nose and into your stomach (nasogastric tube, or NG tube).  Correcting any abnormalities in electrolytes.  Treating the underlying cause of dehydration. Follow these instructions at home:  If directed by your health care provider, drink an ORS:  Make an ORS by following instructions on the  package.  Start by drinking small amounts, about  cup (120 mL) every 5-10 minutes.  Slowly increase how much you drink until you have taken the amount recommended by your health care provider.  Drink enough clear fluid to keep your urine clear or pale yellow. If you were told to drink an ORS, finish the ORS first, then start slowly drinking other clear fluids. Drink fluids such as:  Water. Do not drink only water. Doing that can lead to having too little salt (sodium) in the body (hyponatremia).  Ice chips.  Fruit juice that you have added water to (diluted fruit juice).  Low-calorie sports drinks.  Avoid:  Alcohol.  Drinks that contain a lot of sugar. These include high-calorie sports drinks, fruit juice that is not diluted, and soda.  Caffeine.  Foods that are greasy or contain a lot of fat or sugar.  Take over-the-counter and prescription medicines only as told by your health care provider.  Do not take sodium tablets. This can lead to having too much sodium in the body (hypernatremia).  Eat foods that contain a healthy balance of electrolytes, such as bananas, oranges, potatoes, tomatoes, and spinach.  Keep all follow-up visits as told by your health care provider. This is important. Contact a health care provider if:  You have abdominal pain that:  Gets worse.  Stays in one area (localizes).  You have a rash.  You have a stiff neck.  You are more irritable than usual.  You are sleepier or more difficult to wake up than usual.  You feel weak or dizzy.  You feel very thirsty.  You have urinated only a small amount of very dark urine over 6-8 hours. Get help right away if:  You have symptoms of severe dehydration.  You cannot drink fluids without vomiting.  Your symptoms get worse with treatment.  You have a fever.  You have a severe headache.  You have vomiting or diarrhea that:  Gets worse.  Does not go away.  You have blood or green matter  (bile) in your vomit.  You have blood in your stool. This may cause stool to look black and tarry.  You have not urinated in 6-8 hours.  You faint.  Your heart rate while sitting still is over 100 beats a minute.  You have trouble breathing. This information is not intended to replace advice given to you by your health care provider. Make sure you discuss any questions you have with your health care provider. Document Released: 10/31/2005 Document Revised: 05/27/2016 Document Reviewed: 12/25/2015 Elsevier Interactive Patient Education  2017 [employer]  

## 2016-12-17 DIAGNOSIS — E860 Dehydration: Secondary | ICD-10-CM | POA: Diagnosis present

## 2017-01-06 DIAGNOSIS — Z3400 Encounter for supervision of normal first pregnancy, unspecified trimester: Secondary | ICD-10-CM | POA: Diagnosis not present

## 2017-01-06 DIAGNOSIS — Z363 Encounter for antenatal screening for malformations: Secondary | ICD-10-CM | POA: Diagnosis not present

## 2017-01-06 DIAGNOSIS — Z3480 Encounter for supervision of other normal pregnancy, unspecified trimester: Secondary | ICD-10-CM | POA: Diagnosis not present

## 2017-01-24 ENCOUNTER — Encounter: Payer: Self-pay | Admitting: *Deleted

## 2017-02-01 ENCOUNTER — Ambulatory Visit (INDEPENDENT_AMBULATORY_CARE_PROVIDER_SITE_OTHER): Payer: 59 | Admitting: Women's Health

## 2017-02-01 ENCOUNTER — Encounter: Payer: Self-pay | Admitting: Women's Health

## 2017-02-01 ENCOUNTER — Ambulatory Visit: Payer: 59 | Admitting: *Deleted

## 2017-02-01 VITALS — BP 122/70 | HR 64 | Wt 176.0 lb

## 2017-02-01 DIAGNOSIS — Z3482 Encounter for supervision of other normal pregnancy, second trimester: Secondary | ICD-10-CM | POA: Diagnosis not present

## 2017-02-01 DIAGNOSIS — Z3480 Encounter for supervision of other normal pregnancy, unspecified trimester: Secondary | ICD-10-CM | POA: Insufficient documentation

## 2017-02-01 NOTE — Progress Notes (Signed)
Subjective:  Cristina Davenport is a 31 y.o. [id number] Caucasian female at 58w0dby LMP c/w 7wk u/s, being seen today for her first obstetrical visit. She is transferring from [facility] d/t insurance. Initiated care there @ 8wks. No complications to date this pregnancy.  Her obstetrical history is significant for sab x 1, then pre-e w/ IOL @ 36wks.  Pregnancy history fully reviewed. 4+glucosuria today- reports eating chicken & dumplings, mac & cheese, mashed potatoes, cherry cobbler, and sweet tea for lunch. Has gained 31lb so far during pregnancy.  H/O depression/anxiety- taking lexapro 244mdaily, no counseling, states she's doing well, much better than 1st trimester.   Patient reports some occ pressure, sciatica. Denies vb, cramping, uti s/s, abnormal/malodorous vag d/c, or vulvovaginal itching/irritation.  BP 122/70   Wt 176 lb (79.8 kg)   LMP 08/24/2016   BMI 33.25 kg/m   HISTORY: OB History  Gravida Para Term Preterm AB Living  3 1 0 _0 SAB TAB Ectopic Multiple Live Births  1 0 0 0 1    # Outcome Date GA Lbr Len/2nd Weight Sex Delivery Anes PTL Lv  3 Current           2 Preterm 09/28/08 3537w0d lb 14 oz (2.665 kg) F Vag-Spont EPI Y ND     Complications: Pre-eclampsia  1 SAB              Past Medical History:  Diagnosis Date  . Anxiety   . Anxiety   . [place] GERD (gastroesophageal reflux disease)   . Hypertension    PIH  . IBS (irritable bowel syndrome)   . Migraine without aura    Past Surgical History:  Procedure Laterality Date  . ABDOMINAL SURGERY     cyst removed   . CHOLECYSTECTOMY N/A 07/10/2013   Procedure: LAPAROSCOPIC CHOLECYSTECTOMY;  Surgeon: BreDonato HeinzD;  Location: [facility];  Service: General;  Laterality: N/A;  . ESOPHAGOGASTRODUODENOSCOPY N/A 04/30/2013   [id number] esophagus of uncertain clinical significance-status post biopsy. Tiny hiatal hernia. No explanation  . EXAMINATION UNDER ANESTHESIA  04/21/2012    Gluteal wound repair  . WISDOM TOOTH EXTRACTION     Family History  Problem Relation Age of Onset  . Mental illness Mother   . Cancer Father     skin cancer  . Hypertension Maternal Grandmother   . Stroke Maternal Grandmother   . Hypertension Maternal Grandfather   . Stroke Maternal Grandfather   . Cancer Maternal Grandfather     skin  . Crohn's disease Maternal Aunt   . Crohn's disease Cousin   . Colon cancer Neg Hx     Exam   System:     General: Well developed & nourished, no acute distress   Skin: Warm & dry, normal coloration and turgor, no rashes   Neurologic: Alert & oriented, normal mood   Cardiovascular: Regular rate & rhythm   Respiratory: Effort & rate normal, LCTAB, acyanotic   Abdomen: Soft, non tender   Extremities: normal strength, tone  Thin prep pap smear neg 11/04/16 @ [facility]  FHR: 134 via doppler FH: 25cm   Assessment:   Pregnancy: [id number] Active Problem List   Diagnosis Date Noted  . Encounter for supervision of other normal pregnancy 02/01/2017  . Change in bowel habits 03/30/2016  . Constipation 03/14/2016  . Abdominal bloating 03/14/2016  . Migraine headache 03/12/2016  . Anxiety 04/17/2015  . Low  grade squamous intraepithelial lesion on cytologic smear of cervix (LGSIL) 09/15/2014  . Adjustment disorder with anxious mood 09/14/2014  . Abdominal pain, epigastric 04/26/2013  . Nausea with vomiting 04/26/2013  . Gastritis 02/23/2013    [id number] OB visit, tx from [facility] H/O pre-e w/ IOL @ 36wks Depression/anxiety Sciatica Excessive pregnancy weight gain  Plan:  Initial labs reviewed Continue prenatal vitamins Problem list reviewed and updated Reviewed ptl s/s, fm Discussed weight gain, recommended decreasing carbs, increasing exercise, no further weight gain Reviewed recommended weight gain based on pre-gravid BMI Encouraged well-balanced diet Genetic Screening discussed Integrated Screen: results  reviewed Cystic fibrosis screening discussed declined Ultrasound discussed; fetal survey: results reviewed Follow up in 4 weeks for visit and pn2 CCNC completed Gave printed info on sciatica exercises Continue lexapro 272mdaily, declines counseling Continue baby asa for h/o pre-e   BoTawnya CrookNM, WHMountain Laurel [facility] LLC/21/2018 4:01 PM

## 2017-02-01 NOTE — Patient Instructions (Addendum)
You will have your sugar test next visit.  Please do not eat or drink anything after midnight the night before you come, not even water.  You will be here for at least two hours.     Call the office [phone]) or go to [facility] if:  You begin to have strong, frequent contractions  Your water breaks.  Sometimes it is a big gush of fluid, sometimes it is just a trickle that keeps getting your panties wet or running down your legs  You have vaginal bleeding.  It is normal to have a small amount of spotting if your cervix was checked.   You don't feel your baby moving like normal.  If you don't, get you something to eat and drink and lay down and focus on feeling your baby move.   If your baby is still not moving like normal, you should call the office or go to [facility] Pregnancy The second trimester is from week 13 through week 28, months 4 through 6. The second trimester is often a time when you feel your best. Your body has also adjusted to being pregnant, and you begin to feel better physically. Usually, morning sickness has lessened or quit completely, you may have more energy, and you may have an increase in appetite. The second trimester is also a time when the fetus is growing rapidly. At the end of the sixth month, the fetus is about 9 inches long and weighs about 1 pounds. You will likely begin to feel the baby move (quickening) between 18 and 20 weeks of the pregnancy. BODY CHANGES Your body goes through many changes during pregnancy. The changes vary from woman to woman.   Your weight will continue to increase. You will notice your lower abdomen bulging out.  You may begin to get stretch marks on your hips, abdomen, and breasts.  You may develop headaches that can be relieved by medicines approved by your health care provider.  You may urinate more often because the fetus is pressing on your bladder.  You may develop or continue to have  heartburn as a result of your pregnancy.  You may develop constipation because certain hormones are causing the muscles that push waste through your intestines to slow down.  You may develop hemorrhoids or swollen, bulging veins (varicose veins).  You may have back pain because of the weight gain and pregnancy hormones relaxing your joints between the bones in your pelvis and as a result of a shift in weight and the muscles that support your balance.  Your breasts will continue to grow and be tender.  Your gums may bleed and may be sensitive to brushing and flossing.  Dark spots or blotches (chloasma, mask of pregnancy) may develop on your face. This will likely fade after the baby is born.  A dark line from your belly button to the pubic area (linea nigra) may appear. This will likely fade after the baby is born.  You may have changes in your hair. These can include thickening of your hair, rapid growth, and changes in texture. Some women also have hair loss during or after pregnancy, or hair that feels dry or thin. Your hair will most likely return to normal after your baby is born. WHAT TO EXPECT AT YOUR PRENATAL VISITS During a routine prenatal visit:  You will be weighed to make sure you and the fetus are growing normally.  Your blood pressure will be taken.  Your abdomen will be measured to track your baby's growth.  The fetal heartbeat will be listened to.  Any test results from the previous visit will be discussed. Your health care provider may ask you:  How you are feeling.  If you are feeling the baby move.  If you have had any abnormal symptoms, such as leaking fluid, bleeding, severe headaches, or abdominal cramping.  If you have any questions. Other tests that may be performed during your second trimester include:  Blood tests that check for:  Low iron levels (anemia).  Gestational diabetes (between 24 and 28 weeks).  Rh antibodies.  Urine tests to check  for infections, diabetes, or protein in the urine.  An ultrasound to confirm the proper growth and development of the baby.  An amniocentesis to check for possible genetic problems.  Fetal screens for spina bifida and Down syndrome. HOME CARE INSTRUCTIONS   Avoid all smoking, herbs, alcohol, and unprescribed drugs. These chemicals affect the formation and growth of the baby.  Follow your health care provider's instructions regarding medicine use. There are medicines that are either safe or unsafe to take during pregnancy.  Exercise only as directed by your health care provider. Experiencing uterine cramps is a good sign to stop exercising.  Continue to eat regular, healthy meals.  Wear a good support bra for breast tenderness.  Do not use hot tubs, steam rooms, or saunas.  Wear your seat belt at all times when driving.  Avoid raw meat, uncooked cheese, cat litter boxes, and soil used by cats. These carry germs that can cause birth defects in the baby.  Take your prenatal vitamins.  Try taking a stool softener (if your health care provider approves) if you develop constipation. Eat more high-fiber foods, such as fresh vegetables or fruit and whole grains. Drink plenty of fluids to keep your urine clear or pale yellow.  Take warm sitz baths to soothe any pain or discomfort caused by hemorrhoids. Use hemorrhoid cream if your health care provider approves.  If you develop varicose veins, wear support hose. Elevate your feet for 15 minutes, 3-4 times a day. Limit salt in your diet.  Avoid heavy lifting, wear low heel shoes, and practice good posture.  Rest with your legs elevated if you have leg cramps or low back pain.  Visit your dentist if you have not gone yet during your pregnancy. Use a soft toothbrush to brush your teeth and be gentle when you floss.  A sexual relationship may be continued unless your health care provider directs you otherwise.  Continue to go to all your  prenatal visits as directed by your health care provider. SEEK MEDICAL CARE IF:   You have dizziness.  You have mild pelvic cramps, pelvic pressure, or nagging pain in the abdominal area.  You have persistent nausea, vomiting, or diarrhea.  You have a bad smelling vaginal discharge.  You have pain with urination. SEEK IMMEDIATE MEDICAL CARE IF:   You have a fever.  You are leaking fluid from your vagina.  You have spotting or bleeding from your vagina.  You have severe abdominal cramping or pain.  You have rapid weight gain or loss.  You have shortness of breath with chest pain.  You notice sudden or extreme swelling of your face, hands, ankles, feet, or legs.  You have not felt your baby move in over an hour.  You have severe headaches that do not go away with medicine.  You have vision changes.  Document Released: 10/25/2001 Document Revised: 11/05/2013 Document Reviewed: 01/01/2013 [facility] Patient Information 2015 [place], [place]. This information is not intended to replace advice given to you by your health care provider. Make sure you discuss any questions you have with your health care provider.   Sciatica Rehab Ask your health care provider which exercises are safe for you. Do exercises exactly as told by your health care provider and adjust them as directed. It is normal to feel mild stretching, pulling, tightness, or discomfort as you do these exercises, but you should stop right away if you feel sudden pain or your pain gets worse.Do not begin these exercises until told by your health care provider. Stretching and range of motion exercises These exercises warm up your muscles and joints and improve the movement and flexibility of your hips and your back. These exercises also help to relieve pain, numbness, and tingling. Exercise A: Sciatic nerve glide  1. Sit in a chair with your head facing down toward your chest. Place your hands behind your back. Let your  shoulders slump forward. 2. Slowly straighten one of your knees while you tilt your head back as if you are looking toward the ceiling. Only straighten your leg as far as you can without making your symptoms worse. 3. Hold for __________ seconds. 4. Slowly return to the starting position. 5. Repeat with your other leg. Repeat __________ times. Complete this exercise __________ times a day. Exercise B: Knee to chest with hip adduction and internal rotation   1. Lie on your back on a firm surface with both legs straight. 2. Bend one of your knees and move it up toward your chest until you feel a gentle stretch in your lower back and buttock. Then, move your knee toward the shoulder that is on the opposite side from your leg.  Hold your leg in this position by holding onto the front of your knee. 3. Hold for __________ seconds. 4. Slowly return to the starting position. 5. Repeat with your other leg. Repeat __________ times. Complete this exercise __________ times a day. Exercise C: Prone extension on elbows   1. Lie on your abdomen on a firm surface. A bed may be too soft for this exercise. 2. Prop yourself up on your elbows. 3. Use your arms to help lift your chest up until you feel a gentle stretch in your abdomen and your lower back.  This will place some of your body weight on your elbows. If this is uncomfortable, try stacking pillows under your chest.  Your hips should stay down, against the surface that you are lying on. Keep your hip and back muscles relaxed. 4. Hold for __________ seconds. 5. Slowly relax your upper body and return to the starting position. Repeat __________ times. Complete this exercise __________ times a day. Strengthening exercises These exercises build strength and endurance in your back. Endurance is the ability to use your muscles for a long time, even after they get tired. Exercise D: Pelvic tilt  1. Lie on your back on a firm surface. Bend your knees and  keep your feet flat. 2. Tense your abdominal muscles. Tip your pelvis up toward the ceiling and flatten your lower back into the floor.  To help with this exercise, you may place a small towel under your lower back and try to push your back into the towel. 3. Hold for __________ seconds. 4. Let your muscles relax completely before you repeat this exercise. Repeat __________ times. Complete this  exercise __________ times a day. Exercise E: Alternating arm and leg raises   1. Get on your hands and knees on a firm surface. If you are on a hard floor, you may want to use padding to cushion your knees, such as an exercise mat. 2. Line up your arms and legs. Your hands should be below your shoulders, and your knees should be below your hips. 3. Lift your left leg behind you. At the same time, raise your right arm and straighten it in front of you.  Do not lift your leg higher than your hip.  Do not lift your arm higher than your shoulder.  Keep your abdominal and back muscles tight.  Keep your hips facing the ground.  Do not arch your back.  Keep your balance carefully, and do not hold your breath. 4. Hold for __________ seconds. 5. Slowly return to the starting position and repeat with your right leg and your left arm. Repeat __________ times. Complete this exercise __________ times a day. Posture and body mechanics   Body mechanics refers to the movements and positions of your body while you do your daily activities. Posture is part of body mechanics. Good posture and healthy body mechanics can help to relieve stress in your body's tissues and joints. Good posture means that your spine is in its natural S-curve position (your spine is neutral), your shoulders are pulled back slightly, and your head is not tipped forward. The following are general guidelines for applying improved posture and body mechanics to your everyday activities. Standing    When standing, keep your spine neutral and  your feet about hip-width apart. Keep a slight bend in your knees. Your ears, shoulders, and hips should line up.  When you do a task in which you stand in one place for a long time, place one foot up on a stable object that is 2-4 inches (5-10 cm) high, such as a footstool. This helps keep your spine neutral. Sitting    When sitting, keep your spine neutral and keep your feet flat on the floor. Use a footrest, if necessary, and keep your thighs parallel to the floor. Avoid rounding your shoulders, and avoid tilting your head forward.  When working at a desk or a computer, keep your desk at a height where your hands are slightly lower than your elbows. Slide your chair under your desk so you are close enough to maintain good posture.  When working at a computer, place your monitor at a height where you are looking straight ahead and you do not have to tilt your head forward or downward to look at the screen. Resting    When lying down and resting, avoid positions that are most painful for you.  If you have pain with activities such as sitting, bending, stooping, or squatting (flexion-based activities), lie in a position in which your body does not bend very much. For example, avoid curling up on your side with your arms and knees near your chest (fetal position).  If you have pain with activities such as standing for a long time or reaching with your arms (extension-based activities), lie with your spine in a neutral position and bend your knees slightly. Try the following positions:  Lying on your side with a pillow between your knees.  Lying on your back with a pillow under your knees. Lifting    When lifting objects, keep your feet at least shoulder-width apart and tighten your abdominal muscles.  [employer]  your knees and hips and keep your spine neutral. It is important to lift using the strength of your legs, not your back. Do not lock your knees straight out.  Always ask for help to  lift heavy or awkward objects. This information is not intended to replace advice given to you by your health care provider. Make sure you discuss any questions you have with your health care provider. Document Released: 10/31/2005 Document Revised: 07/07/2016 Document Reviewed: 07/17/2015 Elsevier Interactive Patient Education  2017 [employer].

## 2017-02-02 LAB — PMP SCREEN PROFILE (10S), URINE
Amphetamine Screen, Ur: NEGATIVE ng/mL
Barbiturate Screen, Ur: NEGATIVE ng/mL
Benzodiazepine Screen, Urine: NEGATIVE ng/mL
Cannabinoids Ur Ql Scn: NEGATIVE ng/mL
Cocaine(Metab.)Screen, Urine: NEGATIVE ng/mL
Creatinine(Crt), U: 109.7 mg/dL (ref 20.0–300.0)
Methadone Scn, Ur: NEGATIVE ng/mL
Opiate Scrn, Ur: NEGATIVE ng/mL
Oxycodone+Oxymorphone Ur Ql Scn: NEGATIVE ng/mL
PCP Scrn, Ur: NEGATIVE ng/mL
Ph of Urine: 6.4 (ref 4.5–8.9)
Propoxyphene, Screen: NEGATIVE ng/mL

## 2017-02-13 ENCOUNTER — Encounter (HOSPITAL_COMMUNITY): Payer: Self-pay

## 2017-02-16 ENCOUNTER — Ambulatory Visit (INDEPENDENT_AMBULATORY_CARE_PROVIDER_SITE_OTHER): Payer: 59 | Admitting: Advanced Practice Midwife

## 2017-02-16 ENCOUNTER — Encounter: Payer: Self-pay | Admitting: *Deleted

## 2017-02-16 ENCOUNTER — Telehealth: Payer: Self-pay | Admitting: *Deleted

## 2017-02-16 ENCOUNTER — Encounter: Payer: Self-pay | Admitting: Advanced Practice Midwife

## 2017-02-16 VITALS — BP 110/60 | HR 88 | Wt 176.0 lb

## 2017-02-16 DIAGNOSIS — Z1389 Encounter for screening for other disorder: Secondary | ICD-10-CM

## 2017-02-16 DIAGNOSIS — Z331 Pregnant state, incidental: Secondary | ICD-10-CM

## 2017-02-16 DIAGNOSIS — O368120 Decreased fetal movements, second trimester, not applicable or unspecified: Secondary | ICD-10-CM

## 2017-02-16 DIAGNOSIS — O09299 Supervision of pregnancy with other poor reproductive or obstetric history, unspecified trimester: Secondary | ICD-10-CM | POA: Insufficient documentation

## 2017-02-16 DIAGNOSIS — Z3482 Encounter for supervision of other normal pregnancy, second trimester: Secondary | ICD-10-CM

## 2017-02-16 LAB — POCT URINALYSIS DIPSTICK
Blood, UA: NEGATIVE
Glucose, UA: NEGATIVE
Ketones, UA: NEGATIVE
Leukocytes, UA: NEGATIVE
Nitrite, UA: NEGATIVE

## 2017-02-16 MED ORDER — NIFEDIPINE 20 MG PO CAPS: 20.0000 mg | ORAL_CAPSULE | Freq: Four times a day (QID) | ORAL | 3 refills | Status: DC | PRN

## 2017-02-16 NOTE — Telephone Encounter (Signed)
Patient called stating [facility] pharmacy did not have Procardia nor did [facility]. I called [facility] in [place] who stated they only had 20 capsules but would order more. Informed patient

## 2017-02-16 NOTE — Progress Notes (Signed)
WORK IN OB FOR PRESSURE for several days.   [id number] [redacted]w[redacted]d Estimated Date of Delivery: 05/31/17  Blood pressure 110/60, pulse 88, weight 176 lb (79.8 kg), last menstrual period 08/24/2016, unknown if currently breastfeeding.   BP weight and urine results all reviewed and noted. Urine neg  Please refer to the obstetrical flow sheet for the fundal height and fetal heart rate documentation:  Patient reports decreased fetal movement, denies any bleeding and no rupture of membranes symptoms or regular contractions.  EFM reveals appropriate for GA FHR , active movement now. Pt is having some palpalbe ctx, had about 4-5 over an hour Cx:  LTC, PP out of pelvis  All questions were answered.  Orders Placed This Encounter  Procedures  . POCT urinalysis dipstick    Plan:  Continued routine obstetrical care, will rx Procardia 20mg  q g hr prn >4-5 ctx/hr.  Let us know if she still has >4-5/hr  Return for As scheduled.

## 2017-02-16 NOTE — Patient Instructions (Signed)

## 2017-02-22 ENCOUNTER — Inpatient Hospital Stay (HOSPITAL_COMMUNITY)
Admission: AD | Admit: 2017-02-22 | Discharge: 2017-02-22 | Disposition: A | Discharge status: Home / Self Care | Payer: 59 | Source: Ambulatory Visit | Attending: Family Medicine | Admitting: Family Medicine

## 2017-02-22 ENCOUNTER — Encounter (HOSPITAL_COMMUNITY): Payer: Self-pay

## 2017-02-22 DIAGNOSIS — F319 Bipolar disorder, unspecified: Secondary | ICD-10-CM | POA: Diagnosis not present

## 2017-02-22 DIAGNOSIS — Z9049 Acquired absence of other specified parts of digestive tract: Secondary | ICD-10-CM | POA: Diagnosis not present

## 2017-02-22 DIAGNOSIS — Z3A26 26 weeks gestation of pregnancy: Secondary | ICD-10-CM | POA: Insufficient documentation

## 2017-02-22 DIAGNOSIS — O162 Unspecified maternal hypertension, second trimester: Secondary | ICD-10-CM | POA: Diagnosis present

## 2017-02-22 DIAGNOSIS — Z7982 Long term (current) use of aspirin: Secondary | ICD-10-CM | POA: Diagnosis not present

## 2017-02-22 DIAGNOSIS — Z87891 Personal history of nicotine dependence: Secondary | ICD-10-CM | POA: Insufficient documentation

## 2017-02-22 DIAGNOSIS — Z800 Family history of malignant neoplasm of digestive organs: Secondary | ICD-10-CM | POA: Insufficient documentation

## 2017-02-22 DIAGNOSIS — Z808 Family history of malignant neoplasm of other organs or systems: Secondary | ICD-10-CM | POA: Diagnosis not present

## 2017-02-22 DIAGNOSIS — Z79899 Other long term (current) drug therapy: Secondary | ICD-10-CM | POA: Insufficient documentation

## 2017-02-22 DIAGNOSIS — O4702 False labor before 37 completed weeks of gestation, second trimester: Secondary | ICD-10-CM

## 2017-02-22 DIAGNOSIS — Z8249 Family history of ischemic heart disease and other diseases of the circulatory system: Secondary | ICD-10-CM | POA: Insufficient documentation

## 2017-02-22 DIAGNOSIS — K219 Gastro-esophageal reflux disease without esophagitis: Secondary | ICD-10-CM | POA: Diagnosis not present

## 2017-02-22 DIAGNOSIS — K589 Irritable bowel syndrome without diarrhea: Secondary | ICD-10-CM | POA: Insufficient documentation

## 2017-02-22 DIAGNOSIS — O99612 Diseases of the digestive system complicating pregnancy, second trimester: Secondary | ICD-10-CM | POA: Diagnosis not present

## 2017-02-22 DIAGNOSIS — Z823 Family history of stroke: Secondary | ICD-10-CM | POA: Diagnosis not present

## 2017-02-22 DIAGNOSIS — O99342 Other mental disorders complicating pregnancy, second trimester: Secondary | ICD-10-CM | POA: Diagnosis not present

## 2017-02-22 DIAGNOSIS — Z888 Allergy status to other drugs, medicaments and biological substances status: Secondary | ICD-10-CM | POA: Diagnosis not present

## 2017-02-22 DIAGNOSIS — Z841 Family history of disorders of kidney and ureter: Secondary | ICD-10-CM | POA: Insufficient documentation

## 2017-02-22 DIAGNOSIS — Z9889 Other specified postprocedural states: Secondary | ICD-10-CM | POA: Insufficient documentation

## 2017-02-22 DIAGNOSIS — F419 Anxiety disorder, unspecified: Secondary | ICD-10-CM | POA: Insufficient documentation

## 2017-02-22 LAB — URINALYSIS, ROUTINE W REFLEX MICROSCOPIC
Bilirubin Urine: NEGATIVE
Glucose, UA: NEGATIVE mg/dL
Hgb urine dipstick: NEGATIVE
Ketones, ur: NEGATIVE mg/dL
Leukocytes, UA: NEGATIVE
Nitrite: NEGATIVE
Protein, ur: NEGATIVE mg/dL
Specific Gravity, Urine: 1.006 (ref 1.005–1.030)
pH: 6 (ref 5.0–8.0)

## 2017-02-22 LAB — FETAL FIBRONECTIN: Fetal Fibronectin: NEGATIVE

## 2017-02-22 LAB — WET PREP, GENITAL
Clue Cells Wet Prep HPF POC: NONE SEEN
Sperm: NONE SEEN
Trich, Wet Prep: NONE SEEN
Yeast Wet Prep HPF POC: NONE SEEN

## 2017-02-22 NOTE — MAU Provider Note (Signed)
MAU HISTORY AND PHYSICAL  Chief Complaint:  Contractions   Cristina Davenport is a 31 y.o.  [id number] with IUP at [redacted]w[redacted]d presenting for Contractions  Last Thursday saw Manus Gunning in clinic for contractions and was given Procardia 20mg  q6h prn for contractions and took 3-4 doses between Thursday and Friday and contractions stopped. Patient states she has been having  regular, every 3 minutes contractions that started last night. She drank a glass of water and laid down and fell asleep. Awoke this morning and contractions returned again about q39min. She took procardia 20mg  x 1 at 0930 and because contraction did not remit after an hour, she came in for evaluation. She notes that contractions have decreased to occasional, lasting a few seconds over the last 1-2 hours. She has, no vaginal bleeding, intact membranes, with active fetal movement.    Last pregnancy significant for several admission for preterm labor evaluations and eventually developed pre-eclampsia and was induced at 36 weeks.   Menstrual History: OB History    Gravida Para Term Preterm AB Living   3 1 0 1 1 1    SAB TAB Ectopic Multiple Live Births   1 0 0 0 1      Patient's last menstrual period was 08/24/2016.      Past Medical History:  Diagnosis Date  . Anxiety   . Anxiety   . Bipolar 1 disorder ([place])   . GERD (gastroesophageal reflux disease)   . Hypertension    PIH  . IBS (irritable bowel syndrome)   . Migraine without aura     Past Surgical History:  Procedure Laterality Date  . ABDOMINAL SURGERY     cyst removed   . CHOLECYSTECTOMY N/A 07/10/2013   Procedure: LAPAROSCOPIC CHOLECYSTECTOMY;  Surgeon: Donato Heinz, MD;  Location: [facility];  Service: General;  Laterality: N/A;  . ESOPHAGOGASTRODUODENOSCOPY N/A 04/30/2013   FKC:LEXNTZGY distal esophagus of uncertain clinical significance-status post biopsy. Tiny hiatal hernia. No explanation  . EXAMINATION UNDER ANESTHESIA  04/21/2012   Gluteal wound repair  . WISDOM TOOTH  EXTRACTION      Family History  Problem Relation Age of Onset  . Mental illness Mother   . Cancer Father     skin cancer  . Hypertension Maternal Grandmother   . Stroke Maternal Grandmother   . Kidney failure Maternal Grandmother   . Hypertension Maternal Grandfather   . Stroke Maternal Grandfather   . Cancer Maternal Grandfather     skin  . Crohn's disease Maternal Aunt   . Crohn's disease Cousin   . Colon cancer Neg Hx     Social History  Substance Use Topics  . Smoking status: Former Smoker    Packs/day: 0.00    Years: 8.00    Quit date: 09/19/2014  . Smokeless tobacco: Never Used  . Alcohol use No     Comment: occasionally,  social      Allergies  Allergen Reactions  . Latex Other (See Comments)    General irritation  . Doxycycline Rash    Solar rash    Prescriptions Prior to Admission  Medication Sig Dispense Refill Last Dose  . aspirin 81 MG chewable tablet Chew 81 mg by mouth daily.    Past Week at Unknown time  . calcium carbonate (TUMS - DOSED IN MG ELEMENTAL CALCIUM) 500 MG chewable tablet Chew 1 tablet by mouth 4 (four) times daily as needed for indigestion or heartburn.   02/21/2017 at Unknown time  . clindamycin (CLEOCIN T) 1 %  lotion Apply 1 application topically 2 (two) times daily.  1 02/22/2017 at Unknown time  . Cyanocobalamin (B-12 PO) Take 1 tablet by mouth at bedtime.   02/21/2017 at Unknown time  . doxylamine, Sleep, (UNISOM) 25 MG tablet Take 25 mg by mouth at bedtime as needed for sleep (and nausea).    02/21/2017 at Unknown time  . escitalopram (LEXAPRO) 20 MG tablet Take 20 mg by mouth daily.  6 02/21/2017 at Unknown time  . NIFEdipine (PROCARDIA) 20 MG capsule Take 1 capsule (20 mg total) by mouth every 6 (six) hours as needed. (Patient taking differently: Take 20 mg by mouth every 6 (six) hours as needed (for contractions). ) 60 capsule 3 02/22/2017 at Unknown time  . Prenatal Vit-Fe Fumarate-FA (PRENATAL MULTIVITAMIN) TABS tablet Take 1 tablet by  mouth daily at 12 noon.   02/21/2017 at Unknown time  . valACYclovir (VALTREX) 1000 MG tablet Take 1,000 mg by mouth daily as needed (cold sores).    prn    Review of Systems - Negative except for what is mentioned in HPI.  Physical Exam  Blood pressure 120/67, pulse 87, temperature 98.4 F (36.9 C), temperature source Oral, resp. rate 18, height 5\' 1"  (1.549 m), weight 176 lb (79.8 kg), last menstrual period 08/24/2016, SpO2 99 %, unknown if currently breastfeeding. GENERAL: Well-developed, well-nourished female in no acute distress.  LUNGS: Clear to auscultation bilaterally.  HEART: Regular rate and rhythm. ABDOMEN: Soft, nontender, nondistended, gravid.  EXTREMITIES: Nontender, no edema, 2+ distal pulses. Cervical Exam: Dilatation 0cm   Effacement 0%   Station ballotable   FHT:  Baseline rate 140 bpm   Variability moderate  Accelerations present   Decelerations none Contractions: occasional with uterine irritability   Labs: Results for orders placed or performed during the hospital encounter of 02/22/17 (from the past 24 hour(s))  Urinalysis, Routine w reflex microscopic   Collection Time: 02/22/17 11:27 AM  Result Value Ref Range   Color, Urine STRAW (A) YELLOW   APPearance CLEAR CLEAR   Specific Gravity, Urine 1.006 1.005 - 1.030   pH 6.0 5.0 - 8.0   Glucose, UA NEGATIVE NEGATIVE mg/dL   Hgb urine dipstick NEGATIVE NEGATIVE   Bilirubin Urine NEGATIVE NEGATIVE   Ketones, ur NEGATIVE NEGATIVE mg/dL   Protein, ur NEGATIVE NEGATIVE mg/dL   Nitrite NEGATIVE NEGATIVE   Leukocytes, UA NEGATIVE NEGATIVE  Wet prep, genital   Collection Time: 02/22/17 12:30 PM  Result Value Ref Range   Yeast Wet Prep HPF POC NONE SEEN NONE SEEN   Trich, Wet Prep NONE SEEN NONE SEEN   Clue Cells Wet Prep HPF POC NONE SEEN NONE SEEN   WBC, Wet Prep HPF POC FEW (A) NONE SEEN   Sperm NONE SEEN     Imaging Studies:  No results found.  Assessment: Cristina Davenport is  31 y.o. [id number] at [redacted]w[redacted]d  presents with Contractions   Plan: Patient not in preterm labor and no infection process identified. -Category 1 tracing -Cervical exam: cl/th/hi -FFN: negative -UA: no leuks, no nitrites, urine dilute -Wet prep: no yeast, trich, clue cells, and few WBC  Recommend adequate hydration, prn procardia 20mg  q6h prn for contraction. Discussed if contractions do not remit or get stronger after procardia, should return to MAU for evaluation. Preterm labor precautions given. F/u in 1 week with OB provider.  Francene Boyers 4/11/201812:57 PM   OB FELLOW MAU DISCHARGE ATTESTATION  I have seen and examined this patient; I agree with above documentation  in the resident's note. Encouraged PO hydration, rest when feeling contractions, and procardia as needed if feeling contractions but strict return precautions given for preterm labor symptoms. FFN Neg.    Katherine Basset, DO OB Fellow

## 2017-02-22 NOTE — Discharge Instructions (Signed)
. Preterm Labor and Birth Information The normal length of a pregnancy is 39-41 weeks. Preterm labor is when labor starts before 37 completed weeks of pregnancy. What are the risk factors for preterm labor? Preterm labor is more likely to occur in women who:  Have certain infections during pregnancy such as a bladder infection, sexually transmitted infection, or infection inside the uterus (chorioamnionitis).  Have a shorter-than-normal cervix.  Have gone into preterm labor before.  Have had surgery on their cervix.  Are younger than age 40 or older than age 41.  Are African American.  Are pregnant with twins or multiple babies (multiple gestation).  Take street drugs or smoke while pregnant.  Do not gain enough weight while pregnant.  Became pregnant shortly after having been pregnant. What are the symptoms of preterm labor? Symptoms of preterm labor include:  Cramps similar to those that can happen during a menstrual period. The cramps may happen with diarrhea.  Pain in the abdomen or lower back.  Regular uterine contractions that may feel like tightening of the abdomen.  A feeling of increased pressure in the pelvis.  Increased watery or bloody mucus discharge from the vagina.  Water breaking (ruptured amniotic sac). Why is it important to recognize signs of preterm labor? It is important to recognize signs of preterm labor because babies who are born prematurely may not be fully developed. This can put them at an increased risk for:  Long-term (chronic) heart and lung problems.  Difficulty immediately after birth with regulating body systems, including blood sugar, body temperature, heart rate, and breathing rate.  Bleeding in the brain.  Cerebral palsy.  Learning difficulties.  Death. These risks are highest for babies who are born before 103 weeks of pregnancy. How is preterm labor treated? Treatment depends on the length of your pregnancy, your condition,  and the health of your baby. It may involve:  Having a stitch (suture) placed in your cervix to prevent your cervix from opening too early (cerclage).  Taking or being given medicines, such as:  Hormone medicines. These may be given early in pregnancy to help support the pregnancy.  Medicine to stop contractions.  Medicines to help mature the babys lungs. These may be prescribed if the risk of delivery is high.  Medicines to prevent your baby from developing cerebral palsy. If the labor happens before 34 weeks of pregnancy, you may need to stay in the hospital. What should I do if I think I am in preterm labor? If you think that you are going into preterm labor, call your health care provider right away. How can I prevent preterm labor in future pregnancies? To increase your chance of having a full-term pregnancy:  Do not use any tobacco products, such as cigarettes, chewing tobacco, and e-cigarettes. If you need help quitting, ask your health care provider.  Do not use street drugs or medicines that have not been prescribed to you during your pregnancy.  Talk with your health care provider before taking any herbal supplements, even if you have been taking them regularly.  Make sure you gain a healthy amount of weight during your pregnancy.  Watch for infection. If you think that you might have an infection, get it checked right away.  Make sure to tell your health care provider if you have gone into preterm labor before. This information is not intended to replace advice given to you by your health care provider. Make sure you discuss any questions you have with your  health care provider. Document Released: 01/21/2004 Document Revised: 04/12/2016 Document Reviewed: 03/23/2016 Elsevier Interactive Patient Education  2017 [employer].

## 2017-02-22 NOTE — MAU Note (Signed)
Pt C/O contractions last week - was prescribed procardia. Cervix was closed.   Contractions stopped on Saturday.  Started again last night, last took procardia @ [id number] this morning.  Was advised to come to MAU by provider.  Denies bleeding or LOF.  Also having constant pain around umbilicus.

## 2017-02-23 LAB — GC/CHLAMYDIA PROBE AMP (~~LOC~~) NOT AT ARMC
Chlamydia: NEGATIVE
Neisseria Gonorrhea: NEGATIVE

## 2017-03-01 ENCOUNTER — Telehealth: Payer: Self-pay | Admitting: Advanced Practice Midwife

## 2017-03-01 NOTE — Telephone Encounter (Signed)
Patient called with complaints of umbilicus burning so intensely last night that she felt like she was going to pass out. She states it only happened that one time and it felt like the baby had a knee or elbow pushing against that spot. Informed patient that as the uterus starts to grow, the nerves become more sensitive and stretch. Baby is active, no bleeding or leaking but advised to continue to monitor. Patient verbalized understanding.

## 2017-03-03 ENCOUNTER — Ambulatory Visit (INDEPENDENT_AMBULATORY_CARE_PROVIDER_SITE_OTHER): Payer: 59 | Admitting: Women's Health

## 2017-03-03 ENCOUNTER — Encounter: Payer: Self-pay | Admitting: Women's Health

## 2017-03-03 ENCOUNTER — Other Ambulatory Visit: Payer: 59

## 2017-03-03 VITALS — BP 122/74 | HR 76 | Wt 179.0 lb

## 2017-03-03 DIAGNOSIS — Z1389 Encounter for screening for other disorder: Secondary | ICD-10-CM

## 2017-03-03 DIAGNOSIS — B009 Herpesviral infection, unspecified: Secondary | ICD-10-CM

## 2017-03-03 DIAGNOSIS — Z131 Encounter for screening for diabetes mellitus: Secondary | ICD-10-CM | POA: Diagnosis not present

## 2017-03-03 DIAGNOSIS — Z3482 Encounter for supervision of other normal pregnancy, second trimester: Secondary | ICD-10-CM

## 2017-03-03 DIAGNOSIS — Z331 Pregnant state, incidental: Secondary | ICD-10-CM

## 2017-03-03 LAB — POCT URINALYSIS DIPSTICK
Blood, UA: NEGATIVE
Glucose, UA: NEGATIVE
Ketones, UA: NEGATIVE
Leukocytes, UA: NEGATIVE
Nitrite, UA: NEGATIVE
Protein, UA: NEGATIVE

## 2017-03-03 MED ORDER — CLINDAMYCIN PHOSPHATE 1 % EX LOTN: 1.0000 "application " | TOPICAL_LOTION | Freq: Two times a day (BID) | CUTANEOUS | 0 refills | Status: DC

## 2017-03-03 NOTE — Addendum Note (Signed)
Addended by: Wells Guiles R on: 03/03/2017 10:08 AM   Modules accepted: Orders

## 2017-03-03 NOTE — Progress Notes (Signed)
Low-risk OB appointment [id number] [redacted]w[redacted]d Estimated Date of Delivery: 05/31/17 BP 122/74   Pulse 76   Wt 179 lb (81.2 kg)   LMP 08/24/2016   BMI 33.82 kg/m   BP, weight, and urine reviewed.  Refer to obstetrical flow sheet for FH & FHR.  Reports good fm.  Denies regular uc's, lof, vb, or uti s/s. No complaints. Has to take procardia for uc's about 2-3 times only on 1-2days/wk.  Reviewed ptl s/s, fkc. Recommended Tdap at HD/PCP per CDC guidelines.  Plan:  Continue routine obstetrical care  F/U in 4wks for OB appointment  PN2 today

## 2017-03-03 NOTE — Patient Instructions (Signed)

## 2017-03-04 LAB — CBC
Hematocrit: 37.6 % (ref 34.0–46.6)
Hemoglobin: 12.7 g/dL (ref 11.1–15.9)
MCH: 29.5 pg (ref 26.6–33.0)
MCHC: 33.8 g/dL (ref 31.5–35.7)
MCV: 87 fL (ref 79–97)
Platelets: 227 10*3/uL (ref 150–379)
RBC: 4.31 x10E6/uL (ref 3.77–5.28)
RDW: 13.7 % (ref 12.3–15.4)
WBC: 8.1 10*3/uL (ref 3.4–10.8)

## 2017-03-04 LAB — GLUCOSE TOLERANCE, 2 HOURS W/ 1HR
Glucose, 1 hour: 99 mg/dL (ref 65–179)
Glucose, 2 hour: 93 mg/dL (ref 65–152)
Glucose, Fasting: 67 mg/dL (ref 65–91)

## 2017-03-04 LAB — HIV ANTIBODY (ROUTINE TESTING W REFLEX): HIV Screen 4th Generation wRfx: NONREACTIVE

## 2017-03-04 LAB — ANTIBODY SCREEN: Antibody Screen: NEGATIVE

## 2017-03-04 LAB — RPR: RPR Ser Ql: NONREACTIVE

## 2017-03-12 ENCOUNTER — Encounter (HOSPITAL_COMMUNITY): Payer: Self-pay

## 2017-03-12 ENCOUNTER — Inpatient Hospital Stay (HOSPITAL_COMMUNITY)
Admission: AD | Admit: 2017-03-12 | Discharge: 2017-03-12 | Disposition: A | Discharge status: Home / Self Care | Payer: 59 | Source: Ambulatory Visit | Attending: Obstetrics & Gynecology | Admitting: Obstetrics & Gynecology

## 2017-03-12 DIAGNOSIS — Z79899 Other long term (current) drug therapy: Secondary | ICD-10-CM | POA: Insufficient documentation

## 2017-03-12 DIAGNOSIS — Z91040 Latex allergy status: Secondary | ICD-10-CM | POA: Diagnosis not present

## 2017-03-12 DIAGNOSIS — R109 Unspecified abdominal pain: Secondary | ICD-10-CM | POA: Diagnosis present

## 2017-03-12 DIAGNOSIS — O99613 Diseases of the digestive system complicating pregnancy, third trimester: Secondary | ICD-10-CM | POA: Diagnosis not present

## 2017-03-12 DIAGNOSIS — Z881 Allergy status to other antibiotic agents status: Secondary | ICD-10-CM | POA: Insufficient documentation

## 2017-03-12 DIAGNOSIS — K589 Irritable bowel syndrome without diarrhea: Secondary | ICD-10-CM | POA: Insufficient documentation

## 2017-03-12 DIAGNOSIS — Z3A28 28 weeks gestation of pregnancy: Secondary | ICD-10-CM | POA: Insufficient documentation

## 2017-03-12 DIAGNOSIS — O4703 False labor before 37 completed weeks of gestation, third trimester: Secondary | ICD-10-CM | POA: Insufficient documentation

## 2017-03-12 DIAGNOSIS — F419 Anxiety disorder, unspecified: Secondary | ICD-10-CM | POA: Diagnosis not present

## 2017-03-12 DIAGNOSIS — O99343 Other mental disorders complicating pregnancy, third trimester: Secondary | ICD-10-CM | POA: Insufficient documentation

## 2017-03-12 DIAGNOSIS — F319 Bipolar disorder, unspecified: Secondary | ICD-10-CM | POA: Diagnosis not present

## 2017-03-12 DIAGNOSIS — I10 Essential (primary) hypertension: Secondary | ICD-10-CM | POA: Insufficient documentation

## 2017-03-12 DIAGNOSIS — Z87891 Personal history of nicotine dependence: Secondary | ICD-10-CM | POA: Diagnosis not present

## 2017-03-12 DIAGNOSIS — Z7982 Long term (current) use of aspirin: Secondary | ICD-10-CM | POA: Insufficient documentation

## 2017-03-12 LAB — URINALYSIS, ROUTINE W REFLEX MICROSCOPIC
Bilirubin Urine: NEGATIVE
Glucose, UA: 50 mg/dL — AB
Hgb urine dipstick: NEGATIVE
Ketones, ur: NEGATIVE mg/dL
Leukocytes, UA: NEGATIVE
Nitrite: NEGATIVE
Protein, ur: NEGATIVE mg/dL
Specific Gravity, Urine: 1.008 (ref 1.005–1.030)
pH: 7 (ref 5.0–8.0)

## 2017-03-12 MED ORDER — TERBUTALINE SULFATE 1 MG/ML IJ SOLN
0.2500 mg | Freq: Once | INTRAMUSCULAR | Status: AC
  Administered 2017-03-12: 0.25 mg via SUBCUTANEOUS
  Filled 2017-03-12: qty 1

## 2017-03-12 NOTE — MAU Note (Signed)
Pt here with c/o contractions since about 0130 this afternoon. She is on Procardia PRN for contractions, has taken it twice today.

## 2017-03-12 NOTE — MAU Note (Signed)
Urine in Lab 

## 2017-03-12 NOTE — Discharge Instructions (Signed)
Preterm Labor and Birth Information °Pregnancy normally lasts 39-41 weeks. Preterm labor is when labor starts early. It starts before you have been pregnant for 37 whole weeks. °What are the risk factors for preterm labor? °Preterm labor is more likely to occur in women who: °· Have an infection while pregnant. °· Have a cervix that is short. °· Have gone into preterm labor before. °· Have had surgery on their cervix. °· Are younger than age 29. °· Are older than age 35. °· Are African American. °· Are pregnant with two or more babies. °· Take street drugs while pregnant. °· Smoke while pregnant. °· Do not gain enough weight while pregnant. °· Got pregnant right after another pregnancy. °What are the symptoms of preterm labor? °Symptoms of preterm labor include: °· Cramps. The cramps may feel like the cramps some women get during their period. The cramps may happen with watery poop (diarrhea). °· Pain in the belly (abdomen). °· Pain in the lower back. °· Regular contractions or tightening. It may feel like your belly is getting tighter. °· Pressure in the lower belly that seems to get stronger. °· More fluid (discharge) leaking from the vagina. The fluid may be watery or bloody. °· Water breaking. °Why is it important to notice signs of preterm labor? °Babies who are born early may not be fully developed. They have a higher chance for: °· Long-term heart problems. °· Long-term lung problems. °· Trouble controlling body systems, like breathing. °· Bleeding in the brain. °· A condition called cerebral palsy. °· Learning difficulties. °· Death. °These risks are highest for babies who are born before 34 weeks of pregnancy. °How is preterm labor treated? °Treatment depends on: °· How long you were pregnant. °· Your condition. °· The health of your baby. °Treatment may involve: °· Having a stitch (suture) placed in your cervix. When you give birth, your cervix opens so the baby can come out. The stitch keeps the cervix  from opening too soon. °· Staying at the hospital. °· Taking or getting medicines, such as: °¨ Hormone medicines. °¨ Medicines to stop contractions. °¨ Medicines to help the baby’s lungs develop. °¨ Medicines to prevent your baby from having cerebral palsy. °What should I do if I am in preterm labor? °If you think you are going into labor too soon, call your doctor right away. °How can I prevent preterm labor? °· Do not use any tobacco products. °¨ Examples of these are cigarettes, chewing tobacco, and e-cigarettes. °¨ If you need help quitting, ask your doctor. °· Do not use street drugs. °· Do not use any medicines unless you ask your doctor if they are safe for you. °· Talk with your doctor before taking any herbal supplements. °· Make sure you gain enough weight. °· Watch for infection. If you think you might have an infection, get it checked right away. °· If you have gone into preterm labor before, tell your doctor. °This information is not intended to replace advice given to you by your health care provider. Make sure you discuss any questions you have with your health care provider. °Document Released: 01/27/2009 Document Revised: 04/12/2016 Document Reviewed: 03/23/2016 °Elsevier Interactive Patient Education © 2017 Elsevier Inc. ° °

## 2017-03-12 NOTE — MAU Provider Note (Signed)
History     CSN: [id number]  Arrival date and time: 03/12/17 6789   First Provider Initiated Contact with Patient 03/12/17 2026      Chief Complaint  Patient presents with  . Contractions   Cristina Davenport is a 31 y.o. [id number] at [redacted]w[redacted]d who presents today with contractions. She states that they started around 1300, and she took a dose of procardia at that time. They seemed to decrease, but then around 1800 they started again. She took another dose of procardia at that time, but it did not help as much. She rates her pain 10/10. She denies any VB or LOF. She reports normal fetal movement. She was given procardia on 4/5 due to contractions noted on EFM in the office. She was seen here for contractions on 02/22/17, and cervical exam was closed.    Past Medical History:  Diagnosis Date  . Anxiety   . Anxiety   . Bipolar 1 disorder ([place])   . GERD (gastroesophageal reflux disease)   . Hypertension    PIH  . IBS (irritable bowel syndrome)   . Migraine without aura     Past Surgical History:  Procedure Laterality Date  . ABDOMINAL SURGERY     cyst removed   . CHOLECYSTECTOMY N/A 07/10/2013   Procedure: LAPAROSCOPIC CHOLECYSTECTOMY;  Surgeon: Donato Heinz, MD;  Location: [facility];  Service: General;  Laterality: N/A;  . ESOPHAGOGASTRODUODENOSCOPY N/A 04/30/2013   ZWC:[id number] distal esophagus of uncertain clinical significance-status post biopsy. Tiny hiatal hernia. No explanation  . EXAMINATION UNDER ANESTHESIA  04/21/2012   Gluteal wound repair  . WISDOM TOOTH EXTRACTION      Family History  Problem Relation Age of Onset  . Mental illness Mother   . Cancer Father     skin cancer  . Hypertension Maternal Grandmother   . Stroke Maternal Grandmother   . Kidney failure Maternal Grandmother   . Hypertension Maternal Grandfather   . Stroke Maternal Grandfather   . Cancer Maternal Grandfather     skin  . Crohn's disease Maternal Aunt   . Crohn's disease Cousin   . Colon cancer Neg  Hx     Social History  Substance Use Topics  . Smoking status: Former Smoker    Packs/day: 0.00    Years: 8.00    Quit date: 09/19/2014  . Smokeless tobacco: Never Used  . Alcohol use No     Comment: occasionally,  social    Allergies:  Allergies  Allergen Reactions  . Latex Other (See Comments)    General irritation  . Doxycycline Rash    Solar rash    Prescriptions Prior to Admission  Medication Sig Dispense Refill Last Dose  . aspirin 81 MG chewable tablet Chew 81 mg by mouth daily.    Taking  . calcium carbonate (TUMS - DOSED IN MG ELEMENTAL CALCIUM) 500 MG chewable tablet Chew 1 tablet by mouth 4 (four) times daily as needed for indigestion or heartburn.   Taking  . clindamycin (CLEOCIN T) 1 % lotion Apply 1 application topically 2 (two) times daily. 60 mL 0   . Cyanocobalamin (B-12 PO) Take 1 tablet by mouth at bedtime.   Taking  . doxylamine, Sleep, (UNISOM) 25 MG tablet Take 25 mg by mouth at bedtime as needed for sleep (and nausea).    Taking  . escitalopram (LEXAPRO) 20 MG tablet Take 20 mg by mouth daily.  6 Taking  . NIFEdipine (PROCARDIA) 20 MG capsule Take 1 capsule (20  mg total) by mouth every 6 (six) hours as needed. (Patient taking differently: Take 20 mg by mouth every 6 (six) hours as needed (for contractions). ) 60 capsule 3 Taking  . Prenatal Vit-Fe Fumarate-FA (PRENATAL MULTIVITAMIN) TABS tablet Take 1 tablet by mouth daily at 12 noon.   Taking  . valACYclovir (VALTREX) 1000 MG tablet Take 1,000 mg by mouth daily as needed (cold sores).    Not Taking    Review of Systems  Constitutional: Negative for chills and fever.  Gastrointestinal: Positive for nausea and vomiting (vomited once last night. ).  Genitourinary: Positive for pelvic pain. Negative for dysuria, frequency, urgency, vaginal bleeding and vaginal discharge.   Physical Exam   Blood pressure 126/67, pulse 81, temperature 98.2 F (36.8 C), temperature source Oral, resp. rate 17, height 5\' 1"   (1.549 m), weight 180 lb (81.6 kg), last menstrual period 08/24/2016, unknown if currently breastfeeding.  Physical Exam  Nursing note and vitals reviewed. Constitutional: She is oriented to person, place, and time. She appears well-developed and well-nourished. No distress.  HENT:  Head: Normocephalic.  Cardiovascular: Normal rate.   Respiratory: Effort normal.  GI: Soft. There is no tenderness. There is no rebound.  Neurological: She is alert and oriented to person, place, and time.  Skin: Skin is warm and dry.  Psychiatric: She has a normal mood and affect.   FHT: 140, moderate with 15x15 accels, no decels Toco: about every 3-5 min initially  MAU Course  Procedures  MDM Patient given terbutaline as she has taken 2 20mg  doses of procardia today, and it has not helped with the contractions.  1L LR bolus also given.  2203: Contractions have spaced, irregular on the monitor now.  2223: No change over 2 hours. Patient reports that her pain has improved at this time. Occasional contraction on the monitor, but patient is not feeling these.   Assessment and Plan   1. Threatened preterm labor, third trimester   2. [redacted] weeks gestation of pregnancy    DC home Comfort measures reviewed  3rd Trimester precautions  PTL precautions  Fetal kick counts RX: none, continue procardia as needed  Return to MAU as needed FU with OB as planned  Follow-up Information    [facility] Follow up.   Specialty:  Obstetrics and Gynecology Contact information: [address] [place] 03/12/2017, 8:28 PM

## 2017-03-13 ENCOUNTER — Encounter: Payer: Self-pay | Admitting: Obstetrics & Gynecology

## 2017-03-13 ENCOUNTER — Telehealth: Payer: Self-pay | Admitting: Obstetrics & Gynecology

## 2017-03-13 ENCOUNTER — Ambulatory Visit (INDEPENDENT_AMBULATORY_CARE_PROVIDER_SITE_OTHER): Payer: 59 | Admitting: Obstetrics & Gynecology

## 2017-03-13 VITALS — BP 122/70 | HR 76 | Wt 181.0 lb

## 2017-03-13 DIAGNOSIS — Z331 Pregnant state, incidental: Secondary | ICD-10-CM

## 2017-03-13 DIAGNOSIS — Z1389 Encounter for screening for other disorder: Secondary | ICD-10-CM

## 2017-03-13 DIAGNOSIS — O4702 False labor before 37 completed weeks of gestation, second trimester: Secondary | ICD-10-CM

## 2017-03-13 LAB — POCT URINALYSIS DIPSTICK
Blood, UA: NEGATIVE
Ketones, UA: NEGATIVE
Leukocytes, UA: NEGATIVE
Nitrite, UA: NEGATIVE
Protein, UA: NEGATIVE

## 2017-03-13 MED ORDER — NIFEDIPINE ER OSMOTIC RELEASE 30 MG PO TB24: 30.0000 mg | ORAL_TABLET | Freq: Every day | ORAL | 5 refills | Status: DC

## 2017-03-13 NOTE — Progress Notes (Signed)
[employer] in [employer] appt: follow up from MAU visit last night MAU Provider Note Date of Service: 03/12/2017 8:28 PM Tresea Mall, CNM  Obstetrics and Gynecology  Cosigned by: Lavonia Drafts, MD at 03/12/2017 10:30 PM  Attestation signed by Lavonia Drafts, MD at 03/12/2017 10:30 PM  Attestation of Attending Supervision of Advanced Practitioner (CNM/NP): Evaluation and management procedures were performed by the Advanced Practitioner under my supervision and collaboration.  I have reviewed the Advanced Practitioner's note and chart, and I agree with the management and plan.  Hoyle Sauer Harraway-Smith 10:30 PM          [] Hide copied text [] Hover for attribution information  History   CSN: [id number]  Arrival date and time: 03/12/17 7829   First Provider Initiated Contact with Patient 03/12/17 2026         Chief Complaint  Patient presents with  . Contractions   Cristina Davenport is a 31 y.o. [id number] at [redacted]w[redacted]d who presents today with contractions. She states that they started around 1300, and she took a dose of procardia at that time. They seemed to decrease, but then around 1800 they started again. She took another dose of procardia at that time, but it did not help as much. She rates her pain 10/10. She denies any VB or LOF. She reports normal fetal movement. She was given procardia on 4/5 due to contractions noted on EFM in the office. She was seen here for contractions on 02/22/17, and cervical exam was closed.       Past Medical History:  Diagnosis Date  . Anxiety   . Anxiety   . Bipolar 1 disorder ([place])   . GERD (gastroesophageal reflux disease)   . Hypertension    PIH  . IBS (irritable bowel syndrome)   . Migraine without aura          Past Surgical History:  Procedure Laterality Date  . ABDOMINAL SURGERY     cyst removed   . CHOLECYSTECTOMY N/A 07/10/2013   Procedure: LAPAROSCOPIC CHOLECYSTECTOMY;  Surgeon: Donato Heinz, MD;   Location: [facility];  Service: General;  Laterality: N/A;  . ESOPHAGOGASTRODUODENOSCOPY N/A 04/30/2013   MVH:QIONGEXB distal esophagus of uncertain clinical significance-status post biopsy. Tiny hiatal hernia. No explanation  . EXAMINATION UNDER ANESTHESIA  04/21/2012   Gluteal wound repair  . WISDOM TOOTH EXTRACTION            Family History  Problem Relation Age of Onset  . Mental illness Mother   . Cancer Father     skin cancer  . Hypertension Maternal Grandmother   . Stroke Maternal Grandmother   . Kidney failure Maternal Grandmother   . Hypertension Maternal Grandfather   . Stroke Maternal Grandfather   . Cancer Maternal Grandfather     skin  . Crohn's disease Maternal Aunt   . Crohn's disease Cousin   . Colon cancer Neg Hx           Social History  Substance Use Topics  . Smoking status: Former Smoker    Packs/day: 0.00    Years: 8.00    Quit date: 09/19/2014  . Smokeless tobacco: Never Used  . Alcohol use No     Comment: occasionally,  social    Allergies:       Allergies  Allergen Reactions  . Latex Other (See Comments)    General irritation  . Doxycycline Rash    Solar rash           Prescriptions Prior  to Admission  Medication Sig Dispense Refill Last Dose  . aspirin 81 MG chewable tablet Chew 81 mg by mouth daily.    Taking  . calcium carbonate (TUMS - DOSED IN MG ELEMENTAL CALCIUM) 500 MG chewable tablet Chew 1 tablet by mouth 4 (four) times daily as needed for indigestion or heartburn.   Taking  . clindamycin (CLEOCIN T) 1 % lotion Apply 1 application topically 2 (two) times daily. 60 mL 0   . Cyanocobalamin (B-12 PO) Take 1 tablet by mouth at bedtime.   Taking  . doxylamine, Sleep, (UNISOM) 25 MG tablet Take 25 mg by mouth at bedtime as needed for sleep (and nausea).    Taking  . escitalopram (LEXAPRO) 20 MG tablet Take 20 mg by mouth daily.  6 Taking  . NIFEdipine (PROCARDIA) 20 MG capsule Take 1  capsule (20 mg total) by mouth every 6 (six) hours as needed. (Patient taking differently: Take 20 mg by mouth every 6 (six) hours as needed (for contractions). ) 60 capsule 3 Taking  . Prenatal Vit-Fe Fumarate-FA (PRENATAL MULTIVITAMIN) TABS tablet Take 1 tablet by mouth daily at 12 noon.   Taking  . valACYclovir (VALTREX) 1000 MG tablet Take 1,000 mg by mouth daily as needed (cold sores).    Not Taking    Review of Systems  Constitutional: Negative for chills and fever.  Gastrointestinal: Positive for nausea and vomiting (vomited once last night. ).  Genitourinary: Positive for pelvic pain. Negative for dysuria, frequency, urgency, vaginal bleeding and vaginal discharge.   Physical Exam   Blood pressure 126/67, pulse 81, temperature 98.2 F (36.8 C), temperature source Oral, resp. rate 17, height 5\' 1"  (1.549 m), weight 180 lb (81.6 kg), last menstrual period 08/24/2016, unknown if currently breastfeeding.  Physical Exam  Nursing note and vitals reviewed. Constitutional: She is oriented to person, place, and time. She appears well-developed and well-nourished. No distress.  HENT:  Head: Normocephalic.  Cardiovascular: Normal rate.   Respiratory: Effort normal.  GI: Soft. There is no tenderness. There is no rebound.  Neurological: She is alert and oriented to person, place, and time.  Skin: Skin is warm and dry.  Psychiatric: She has a normal mood and affect.   FHT: 140, moderate with 15x15 accels, no decels Toco: about every 3-5 min initially  MAU Course  Procedures  MDM Patient given terbutaline as she has taken 2 20mg  doses of procardia today, and it has not helped with the contractions.  1L LR bolus also given.  2203: Contractions have spaced, irregular on the monitor now.  2223: No change over 2 hours. Patient reports that her pain has improved at this time. Occasional contraction on the monitor, but patient is not feeling these.   Assessment and Plan   1.  Threatened preterm labor, third trimester   2. [redacted] weeks gestation of pregnancy    DC home Comfort measures reviewed  3rd Trimester precautions  PTL precautions  Fetal kick counts RX: none, continue procardia as needed  Return to MAU as needed FU with OB as planned     Follow-up Information    [facility] Follow up.   Specialty:  Obstetrics and Gynecology Contact information: [address] [place] 03/12/2017, 8:28 PM     Electronically signed by Tresea Mall, CNM at 03/12/2017 10:25 PM Electronically signed by Lavonia Drafts, MD at 03/12/2017 10:30 PM  Admission (Discharged) on 03/12/2017        Revision History        Detailed Report       [id number] [redacted]w[redacted]d Estimated Date of Delivery: 05/31/17  Blood pressure 122/70, pulse 76, weight 181 lb (82.1 kg), last menstrual period 08/24/2016, unknown if currently breastfeeding.   BP weight and urine results all reviewed and noted.  Please refer to the obstetrical flow sheet for the fundal height and fetal heart rate documentation:  Patient reports good fetal movement, denies any bleeding and no rupture of membranes symptoms or regular contractions. Patient is without complaints. All questions were answered.  Orders Placed This Encounter  Procedures  . POCT urinalysis dipstick    Plan:  Continued routine obstetrical care, cervix today LTC firm  Begin procardia xl 30 daily Recommend prenatal cradle  Follow up next week for re eval on procardia and her prenatal cradle  Return in about 1 week (around 03/20/2017) for [place], with Dr Elonda Husky.

## 2017-03-13 NOTE — Telephone Encounter (Signed)
Pt went to the hospital yesterday because she was having a lot pain and cramping, Hospital checked her and she has not dilated.  Pt was given a shot at the hospital but it did not work, Pt states that she is cramping still but today and would like to know what she can do. Please contact pt

## 2017-03-13 NOTE — Telephone Encounter (Signed)
Patient called stating she was seen in MAU yesterday for cramping and contractions. She took her Procardia and was given Terb with only 30 minutes of relief. Her abdomen is tender from her belly button down and is constantly hurting especially with movement. She has been laying on her left side for 1 hour and and has not had pain but when she gets up the pain is 8/10. Per Maudie Mercury, patient needs to see Dr Elonda Husky. Patient verbalized understanding.

## 2017-03-14 LAB — URINE CULTURE: Culture: 60000 — AB

## 2017-03-20 ENCOUNTER — Ambulatory Visit (INDEPENDENT_AMBULATORY_CARE_PROVIDER_SITE_OTHER): Payer: 59 | Admitting: Obstetrics & Gynecology

## 2017-03-20 ENCOUNTER — Encounter: Payer: Self-pay | Admitting: Obstetrics & Gynecology

## 2017-03-20 VITALS — BP 110/70 | HR 78 | Wt 180.8 lb

## 2017-03-20 DIAGNOSIS — Z1389 Encounter for screening for other disorder: Secondary | ICD-10-CM

## 2017-03-20 DIAGNOSIS — Z331 Pregnant state, incidental: Secondary | ICD-10-CM

## 2017-03-20 DIAGNOSIS — Z3493 Encounter for supervision of normal pregnancy, unspecified, third trimester: Secondary | ICD-10-CM

## 2017-03-20 LAB — POCT URINALYSIS DIPSTICK
Blood, UA: NEGATIVE
Glucose, UA: NEGATIVE
Ketones, UA: NEGATIVE
Leukocytes, UA: NEGATIVE
Nitrite, UA: NEGATIVE
Protein, UA: NEGATIVE

## 2017-03-20 NOTE — Progress Notes (Signed)
[id number] [redacted]w[redacted]d Estimated Date of Delivery: 05/31/17  Blood pressure 110/70, pulse 78, weight 180 lb 12.8 oz (82 kg), last menstrual period 08/24/2016, unknown if currently breastfeeding.   BP weight and urine results all reviewed and noted.  Please refer to the obstetrical flow sheet for the fundal height and fetal heart rate documentation:  Patient reports good fetal movement, denies any bleeding and no rupture of membranes symptoms or regular contractions. Patient is without complaints. All questions were answered.  Orders Placed This Encounter  Procedures  . POCT urinalysis dipstick    Plan:  Continued routine obstetrical care, continue procardia xl 30 mg  Return in about 2 weeks (around 04/03/2017) for [place].

## 2017-03-27 DIAGNOSIS — Z3483 Encounter for supervision of other normal pregnancy, third trimester: Secondary | ICD-10-CM | POA: Diagnosis not present

## 2017-03-27 DIAGNOSIS — Z3482 Encounter for supervision of other normal pregnancy, second trimester: Secondary | ICD-10-CM | POA: Diagnosis not present

## 2017-03-31 ENCOUNTER — Encounter: Payer: Self-pay | Admitting: Obstetrics & Gynecology

## 2017-03-31 ENCOUNTER — Ambulatory Visit (INDEPENDENT_AMBULATORY_CARE_PROVIDER_SITE_OTHER): Payer: 59 | Admitting: Obstetrics & Gynecology

## 2017-03-31 VITALS — BP 120/80 | HR 84 | Wt 181.4 lb

## 2017-03-31 DIAGNOSIS — O4702 False labor before 37 completed weeks of gestation, second trimester: Secondary | ICD-10-CM

## 2017-03-31 DIAGNOSIS — O4703 False labor before 37 completed weeks of gestation, third trimester: Secondary | ICD-10-CM

## 2017-03-31 DIAGNOSIS — Z1389 Encounter for screening for other disorder: Secondary | ICD-10-CM

## 2017-03-31 DIAGNOSIS — Z3493 Encounter for supervision of normal pregnancy, unspecified, third trimester: Secondary | ICD-10-CM

## 2017-03-31 DIAGNOSIS — Z331 Pregnant state, incidental: Secondary | ICD-10-CM

## 2017-03-31 LAB — POCT URINALYSIS DIPSTICK
Blood, UA: NEGATIVE
Glucose, UA: 3
Leukocytes, UA: NEGATIVE
Nitrite, UA: NEGATIVE
Protein, UA: NEGATIVE

## 2017-03-31 NOTE — Progress Notes (Signed)
[id number] [redacted]w[redacted]d Estimated Date of Delivery: 05/31/17  Blood pressure 120/80, pulse 84, weight 181 lb 6.4 oz (82.3 kg), last menstrual period 08/24/2016, unknown if currently breastfeeding.   BP weight and urine results all reviewed and noted.  Please refer to the obstetrical flow sheet for the fundal height and fetal heart rate documentation:  Patient reports good fetal movement, denies any bleeding and no rupture of membranes symptoms or regular contractions. Patient is without complaints. All questions were answered.  Orders Placed This Encounter  Procedures  . POCT urinalysis dipstick    Plan:  Continued routine obstetrical care, continue procardia xl 30 and prn doses Also has muscle pain below the PSIS recommend local lidocaine patches continue prenatal cradle usage Consider local injections if pain continues  Return in about 2 weeks (around 04/14/2017) for LROB.

## 2017-04-11 ENCOUNTER — Encounter: Payer: Self-pay | Admitting: Obstetrics and Gynecology

## 2017-04-14 ENCOUNTER — Ambulatory Visit (INDEPENDENT_AMBULATORY_CARE_PROVIDER_SITE_OTHER): Payer: 59 | Admitting: Obstetrics & Gynecology

## 2017-04-14 ENCOUNTER — Encounter: Payer: Self-pay | Admitting: Obstetrics & Gynecology

## 2017-04-14 VITALS — BP 120/80 | HR 76 | Wt 185.4 lb

## 2017-04-14 DIAGNOSIS — Z331 Pregnant state, incidental: Secondary | ICD-10-CM

## 2017-04-14 DIAGNOSIS — Z3493 Encounter for supervision of normal pregnancy, unspecified, third trimester: Secondary | ICD-10-CM

## 2017-04-14 DIAGNOSIS — Z1389 Encounter for screening for other disorder: Secondary | ICD-10-CM

## 2017-04-14 LAB — POCT URINALYSIS DIPSTICK
Blood, UA: NEGATIVE
Glucose, UA: 1
Ketones, UA: NEGATIVE
Leukocytes, UA: NEGATIVE
Nitrite, UA: NEGATIVE
Protein, UA: NEGATIVE

## 2017-04-14 MED ORDER — ACYCLOVIR 5 % EX OINT: 1.0000 "application " | TOPICAL_OINTMENT | CUTANEOUS | 1 refills | Status: DC

## 2017-04-14 NOTE — Progress Notes (Signed)
[id number] [redacted]w[redacted]d Estimated Date of Delivery: 05/31/17  Blood pressure 120/80, pulse 76, weight 185 lb 6.4 oz (84.1 kg), last menstrual period 08/24/2016, unknown if currently breastfeeding.   BP weight and urine results all reviewed and noted.  Please refer to the obstetrical flow sheet for the fundal height and fetal heart rate documentation:  Patient reports good fetal movement, denies any bleeding and no rupture of membranes symptoms or regular contractions. Patient is without complaints. All questions were answered.  Orders Placed This Encounter  Procedures  . 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

## 2017-04-20 ENCOUNTER — Telehealth: Payer: Self-pay | Admitting: *Deleted

## 2017-04-20 NOTE — Telephone Encounter (Signed)
Patient called stating it feels like the baby is pushing down in her vagina and trying to come out. She is not contracting or having any discomfort. Informed patient that as the baby gets lower into the pelvis, it will feel like that. As long as she is not contracting or hurting this is normal just may be uncomfortable. If she starts having regular consistent contractions lasting at least 1 hour, to call us or [facility] Verbalized understanding.

## 2017-04-24 ENCOUNTER — Telehealth: Payer: Self-pay | Admitting: *Deleted

## 2017-04-24 NOTE — Telephone Encounter (Signed)
Patient called stating she has been having contractions since 7am every 3-4 minutes lasting 1 minute. She is feeling them in the mid to upper abdomen, no bleeding or leaking and baby is active. Advised patient to rest, push fluids and continue to monitor. Informed patient that these contractions could last hours to days with no cervical change. Encouraged patient to call us or go to [facility] if she notices any bleeding, leaking of fluid or change in baby's movement. Verbalized understanding.

## 2017-04-26 ENCOUNTER — Ambulatory Visit (INDEPENDENT_AMBULATORY_CARE_PROVIDER_SITE_OTHER): Payer: 59 | Admitting: Advanced Practice Midwife

## 2017-04-26 ENCOUNTER — Encounter: Payer: Self-pay | Admitting: Advanced Practice Midwife

## 2017-04-26 VITALS — BP 116/82 | HR 70 | Wt 186.0 lb

## 2017-04-26 DIAGNOSIS — Z331 Pregnant state, incidental: Secondary | ICD-10-CM

## 2017-04-26 DIAGNOSIS — Z3483 Encounter for supervision of other normal pregnancy, third trimester: Secondary | ICD-10-CM

## 2017-04-26 DIAGNOSIS — Z1389 Encounter for screening for other disorder: Secondary | ICD-10-CM

## 2017-04-26 LAB — POCT URINALYSIS DIPSTICK
Blood, UA: NEGATIVE
Ketones, UA: NEGATIVE
Leukocytes, UA: NEGATIVE
Nitrite, UA: NEGATIVE
Protein, UA: NEGATIVE

## 2017-04-26 NOTE — Patient Instructions (Signed)

## 2017-04-28 ENCOUNTER — Encounter: Payer: 59 | Admitting: Women's Health

## 2017-05-03 ENCOUNTER — Encounter: Payer: Self-pay | Admitting: Advanced Practice Midwife

## 2017-05-03 ENCOUNTER — Ambulatory Visit (INDEPENDENT_AMBULATORY_CARE_PROVIDER_SITE_OTHER): Payer: 59 | Admitting: Advanced Practice Midwife

## 2017-05-03 VITALS — BP 164/88 | HR 88 | Wt 191.0 lb

## 2017-05-03 DIAGNOSIS — Z3A36 36 weeks gestation of pregnancy: Secondary | ICD-10-CM | POA: Diagnosis not present

## 2017-05-03 DIAGNOSIS — Z331 Pregnant state, incidental: Secondary | ICD-10-CM

## 2017-05-03 DIAGNOSIS — R030 Elevated blood-pressure reading, without diagnosis of hypertension: Secondary | ICD-10-CM

## 2017-05-03 DIAGNOSIS — Z3483 Encounter for supervision of other normal pregnancy, third trimester: Secondary | ICD-10-CM

## 2017-05-03 DIAGNOSIS — Z1389 Encounter for screening for other disorder: Secondary | ICD-10-CM

## 2017-05-03 LAB — POCT URINALYSIS DIPSTICK
Blood, UA: NEGATIVE
Ketones, UA: NEGATIVE
Leukocytes, UA: NEGATIVE
Nitrite, UA: NEGATIVE
Protein, UA: NEGATIVE

## 2017-05-03 NOTE — Patient Instructions (Addendum)
160/110 or more (2-3 times) go to [facility]

## 2017-05-03 NOTE — Progress Notes (Signed)
[id number] [redacted]w[redacted]d Estimated Date of Delivery: 05/31/17  Blood pressure (!) 164/88, pulse 88, weight 191 lb (86.6 kg), last menstrual period 08/24/2016, unknown if currently breastfeeding.  142/88  Checked at lunch 124/70.(works at [employer])   BP weight and urine results all reviewed and noted.  Please refer to the obstetrical flow sheet for the fundal height and fetal heart rate documentation:  Patient reports good fetal movement, denies any bleeding and no rupture of membranes symptoms or regular contractions. Patient is without complaints Has slight HA, but has had one off and on for a few weeks and hasn't had to take any meds, it goes away on its own.  . All questions were answered.  Orders Placed This Encounter  Procedures  . Protein / creatinine ratio, urine  . CBC  . Comprehensive metabolic panel  . POCT Urinalysis Dipstick    Plan:  Continued routine obstetrical care, Take BPs at work tomorrow. Parameters given  Return in about 2 days (around 05/05/2017).

## 2017-05-04 ENCOUNTER — Encounter: Payer: Self-pay | Admitting: Advanced Practice Midwife

## 2017-05-04 LAB — COMPREHENSIVE METABOLIC PANEL
ALT: 15 IU/L (ref 0–32)
AST: 16 IU/L (ref 0–40)
Albumin/Globulin Ratio: 1.5 (ref 1.2–2.2)
Albumin: 3.7 g/dL (ref 3.5–5.5)
Alkaline Phosphatase: 116 IU/L (ref 39–117)
BUN/Creatinine Ratio: 11 (ref 9–23)
BUN: 7 mg/dL (ref 6–20)
Bilirubin Total: <0.2 mg/dL (ref 0.0–1.2)
CO2: 19 mmol/L — ABNORMAL LOW (ref 20–29)
Calcium: 9.3 mg/dL (ref 8.7–10.2)
Chloride: 104 mmol/L (ref 96–106)
Creatinine, Ser: 0.64 mg/dL (ref 0.57–1.00)
GFR calc Af Amer: 140 mL/min/{1.73_m2} (ref >59)
GFR calc non Af Amer: 122 mL/min/{1.73_m2} (ref >59)
Globulin, Total: 2.4 g/dL (ref 1.5–4.5)
Glucose: 81 mg/dL (ref 65–99)
Potassium: 4.4 mmol/L (ref 3.5–5.2)
Sodium: 139 mmol/L (ref 134–144)
Total Protein: 6.1 g/dL (ref 6.0–8.5)

## 2017-05-04 LAB — CBC
Hematocrit: 37.7 % (ref 34.0–46.6)
Hemoglobin: 12.5 g/dL (ref 11.1–15.9)
MCH: 29 pg (ref 26.6–33.0)
MCHC: 33.2 g/dL (ref 31.5–35.7)
MCV: 88 fL (ref 79–97)
Platelets: 199 10*3/uL (ref 150–379)
RBC: 4.31 x10E6/uL (ref 3.77–5.28)
RDW: 14 % (ref 12.3–15.4)
WBC: 10.9 10*3/uL — ABNORMAL HIGH (ref 3.4–10.8)

## 2017-05-04 LAB — PROTEIN / CREATININE RATIO, URINE
Creatinine, Urine: 69.7 mg/dL
Protein, Ur: 10.9 mg/dL
Protein/Creat Ratio: 156 mg/g creat (ref 0–200)

## 2017-05-05 ENCOUNTER — Ambulatory Visit (INDEPENDENT_AMBULATORY_CARE_PROVIDER_SITE_OTHER): Payer: 59 | Admitting: Women's Health

## 2017-05-05 ENCOUNTER — Encounter: Payer: Self-pay | Admitting: Women's Health

## 2017-05-05 VITALS — BP 120/86 | HR 78 | Wt 190.0 lb

## 2017-05-05 DIAGNOSIS — Z1389 Encounter for screening for other disorder: Secondary | ICD-10-CM

## 2017-05-05 DIAGNOSIS — Z3483 Encounter for supervision of other normal pregnancy, third trimester: Secondary | ICD-10-CM

## 2017-05-05 DIAGNOSIS — Z331 Pregnant state, incidental: Secondary | ICD-10-CM

## 2017-05-05 LAB — POCT URINALYSIS DIPSTICK
Blood, UA: NEGATIVE
Glucose, UA: NEGATIVE
Ketones, UA: NEGATIVE
Leukocytes, UA: NEGATIVE
Nitrite, UA: NEGATIVE
Protein, UA: NEGATIVE

## 2017-05-05 LAB — GC/CHLAMYDIA PROBE AMP
Chlamydia trachomatis, NAA: NEGATIVE
Neisseria gonorrhoeae by PCR: NEGATIVE

## 2017-05-05 LAB — STREP GP B NAA: Strep Gp B NAA: NEGATIVE

## 2017-05-05 MED ORDER — ACYCLOVIR 400 MG PO TABS: 400.0000 mg | ORAL_TABLET | Freq: Three times a day (TID) | ORAL | 3 refills | Status: DC

## 2017-05-05 NOTE — Patient Instructions (Addendum)
Call the office [phone]) or go to [facility] if:  You begin to have strong, frequent contractions  Your water breaks.  Sometimes it is a big gush of fluid, sometimes it is just a trickle that keeps getting your panties wet or running down your legs  You have vaginal bleeding.  It is normal to have a small amount of spotting if your cervix was checked.   You don't feel your baby moving like normal.  If you don't, get you something to eat and drink and lay down and focus on feeling your baby move.  You should feel at least 10 movements in 2 hours.  If you don't, you should call the office or go to [facility].    Call the office [phone]) or go to [facility] hospital for these signs of pre-eclampsia:  Severe headache that does not go away with Tylenol  Visual changes- seeing spots, double, blurred vision  Pain under your right breast or upper abdomen that does not go away with Tums or heartburn medicine  Nausea and/or vomiting  Severe swelling in your hands, feet, and face   For Dizzy Spells:   This is usually related to either your blood sugar or your blood pressure dropping  Make sure you are staying well hydrated and drinking enough water so that your urine is clear  Eat small frequent meals and snacks containing protein (meat, eggs, nuts, cheese) so that your blood sugar doesn't drop  If you do get dizzy, sit/lay down and get you something to drink and a snack containing protein- you will usually start feeling better in 10-20 minutes      Braxton Hicks Contractions Contractions of the uterus can occur throughout pregnancy, but they are not always a sign that you are in labor. You may have practice contractions called Braxton Hicks contractions. These false labor contractions are sometimes confused with true labor. What are [place] contractions? Braxton Hicks contractions are tightening movements that occur in the muscles of the uterus before labor. Unlike  true labor contractions, these contractions do not result in opening (dilation) and thinning of the cervix. Toward the end of pregnancy (32-34 weeks), Braxton Hicks contractions can happen more often and may become stronger. These contractions are sometimes difficult to tell apart from true labor because they can be very uncomfortable. You should not feel embarrassed if you go to the hospital with false labor. Sometimes, the only way to tell if you are in true labor is for your health care provider to look for changes in the cervix. The health care provider will do a physical exam and may monitor your contractions. If you are not in true labor, the exam should show that your cervix is not dilating and your water has not broken. If there are no prenatal problems or other health problems associated with your pregnancy, it is completely safe for you to be sent home with false labor. You may continue to have Braxton Hicks contractions until you go into true labor. How can I tell the difference between true labor and false labor?  Differences ? False labor ? Contractions last 30-70 seconds.: Contractions are usually shorter and not as strong as true labor contractions. ? Contractions become very regular.: Contractions are usually irregular. ? Discomfort is usually felt in the top of the uterus, and it spreads to the lower abdomen and low back.: Contractions are often felt in the front of the lower abdomen and in the groin. ? Contractions do not  go away with walking.: Contractions may go away when you walk around or change positions while lying down. ? Contractions usually become more intense and increase in frequency.: Contractions get weaker and are shorter-lasting as time goes on. ? The cervix dilates and gets thinner.: The cervix usually does not dilate or become thin. Follow these instructions at home:  Take over-the-counter and prescription medicines only as told by your health care provider.  Keep  up with your usual exercises and follow other instructions from your health care provider.  Eat and drink lightly if you think you are going into labor.  If Braxton Hicks contractions are making you uncomfortable: ? Change your position from lying down or resting to walking, or change from walking to resting. ? Sit and rest in a tub of warm water. ? Drink enough fluid to keep your urine clear or pale yellow. Dehydration may cause these contractions. ? Do slow and deep breathing several times an hour.  Keep all follow-up prenatal visits as told by your health care provider. This is important. Contact a health care provider if:  You have a fever.  You have continuous pain in your abdomen. Get help right away if:  Your contractions become stronger, more regular, and closer together.  You have fluid leaking or gushing from your vagina.  You pass blood-tinged mucus (bloody show).  You have bleeding from your vagina.  You have low back pain that you never had before.  You feel your baby's head pushing down and causing pelvic pressure.  Your baby is not moving inside you as much as it used to. Summary  Contractions that occur before labor are called Braxton Hicks contractions, false labor, or practice contractions.  Braxton Hicks contractions are usually shorter, weaker, farther apart, and less regular than true labor contractions. True labor contractions usually become progressively stronger and regular and they become more frequent.  Manage discomfort from [facility] contractions by changing position, resting in a warm bath, drinking plenty of water, or practicing deep breathing. This information is not intended to replace advice given to you by your health care provider. Make sure you discuss any questions you have with your health care provider. Document Released: 10/31/2005 Document Revised: 09/19/2016 Document Reviewed: 09/19/2016 Elsevier Interactive Patient Education   2017 [employer]   Preeclampsia and Eclampsia Preeclampsia is a serious condition that develops only during pregnancy. It is also called toxemia of pregnancy. This condition causes high blood pressure along with other symptoms, such as swelling and headaches. These symptoms may develop as the condition gets worse. Preeclampsia may occur at 20 weeks of pregnancy or later. Diagnosing and treating preeclampsia early is very important. If not treated early, it can cause serious problems for you and your baby. One problem it can lead to is eclampsia, which is a condition that causes muscle jerking or shaking (convulsions or seizures) in the mother. Delivering your baby is the best treatment for preeclampsia or eclampsia. Preeclampsia and eclampsia symptoms usually go away after your baby is born. What are the causes? The cause of preeclampsia is not known. What increases the risk? The following risk factors make you more likely to develop preeclampsia:  Being pregnant for the first time.  Having had preeclampsia during a past pregnancy.  Having a family history of preeclampsia.  Having high blood pressure.  Being pregnant with twins or triplets.  Being 46 or older.  Being African-American.  Having kidney disease or diabetes.  Having medical conditions such as  lupus or blood diseases.  Being very overweight (obese).  What are the signs or symptoms? The earliest signs of preeclampsia are:  High blood pressure.  Increased protein in your urine. Your health care provider will check for this at every visit before you give birth (prenatal visit).  Other symptoms that may develop as the condition gets worse include:  Severe headaches.  Sudden weight gain.  Swelling of the hands, face, legs, and feet.  Nausea and vomiting.  Vision problems, such as blurred or double vision.  Numbness in the face, arms, legs, and feet.  Urinating less than usual.  Dizziness.  Slurred  speech.  Abdominal pain, especially upper abdominal pain.  Convulsions or seizures.  Symptoms generally go away after giving birth. How is this diagnosed? There are no screening tests for preeclampsia. Your health care provider will ask you about symptoms and check for signs of preeclampsia during your prenatal visits. You may also have tests that include:  Urine tests.  Blood tests.  Checking your blood pressure.  Monitoring your baby's heart rate.  Ultrasound.  How is this treated? You and your health care provider will determine the treatment approach that is best for you. Treatment may include:  Having more frequent prenatal exams to check for signs of preeclampsia, if you have an increased risk for preeclampsia.  Bed rest.  Reducing how much salt (sodium) you eat.  Medicine to lower your blood pressure.  Staying in the hospital, if your condition is severe. There, treatment will focus on controlling your blood pressure and the amount of fluids in your body (fluid retention).  You may need to take medicine (magnesium sulfate) to prevent seizures. This medicine may be given as an injection or through an IV tube.  Delivering your baby early, if your condition gets worse. You may have your labor started with medicine (induced), or you may have a cesarean delivery.  Follow these instructions at home: Eating and drinking   Drink enough fluid to keep your urine clear or pale yellow.  Eat a healthy diet that is low in sodium. Do not add salt to your food. Check nutrition labels to see how much sodium a food or beverage contains.  Avoid caffeine. Lifestyle  Do not use any products that contain nicotine or tobacco, such as cigarettes and e-cigarettes. If you need help quitting, ask your health care provider.  Do not use alcohol or drugs.  Avoid stress as much as possible. Rest and get plenty of sleep. General instructions  Take over-the-counter and prescription  medicines only as told by your health care provider.  When lying down, lie on your side. This keeps pressure off of your baby.  When sitting or lying down, raise (elevate) your feet. Try putting some pillows underneath your lower legs.  Exercise regularly. Ask your health care provider what kinds of exercise are best for you.  Keep all follow-up and prenatal visits as told by your health care provider. This is important. How is this prevented? To prevent preeclampsia or eclampsia from developing during another pregnancy:  Get proper medical care during pregnancy. Your health care provider may be able to prevent preeclampsia or diagnose and treat it early.  Your health care provider may have you take a low-dose aspirin or a calcium supplement during your next pregnancy.  You may have tests of your blood pressure and kidney function after giving birth.  Maintain a healthy weight. Ask your health care provider for help managing weight gain during  pregnancy.  Work with your health care provider to manage any long-term (chronic) health conditions you have, such as diabetes or kidney problems.  Contact a health care provider if:  You gain more weight than expected.  You have headaches.  You have nausea or vomiting.  You have abdominal pain.  You feel dizzy or light-headed. Get help right away if:  You develop sudden or severe swelling anywhere in your body. This usually happens in the legs.  You gain 5 lbs (2.3 kg) or more during one week.  You have severe: ? Abdominal pain. ? Headaches. ? Dizziness. ? Vision problems. ? Confusion. ? Nausea or vomiting.  You have a seizure.  You have trouble moving any part of your body.  You develop numbness in any part of your body.  You have trouble speaking.  You have any abnormal bleeding.  You pass out. This information is not intended to replace advice given to you by your health care provider. Make sure you discuss any  questions you have with your health care provider. Document Released: 10/28/2000 Document Revised: 06/28/2016 Document Reviewed: 06/06/2016 Elsevier Interactive Patient Education  [employer].

## 2017-05-05 NOTE — Progress Notes (Signed)
Low-risk OB appointment [id number] [redacted]w[redacted]d Estimated Date of Delivery: 05/31/17 BP 100/60   Pulse 78   Wt 190 lb (86.2 kg)   LMP 08/24/2016   BMI 35.90 kg/m   BP, weight, and urine reviewed.  Refer to obstetrical flow sheet for FH & FHR.  Reports good fm.  Denies regular uc's, lof, vb, or uti s/s. 2d f/u for bp. Was 164/88 on 6/20. Pre-e labs normal. Does have h/o pre-e that developed at 35wks w/ IOL @ 36wks. BPs at home/work w/ manual cuff 130s/80s-90s. Some headaches for past few weeks- nothing bad enough to take apap for- goes away on their own. Some seeing spots for ~1wk. RUQ but points to uterus/abdomen and says they feel like intermittent uc's- no pain directly RUQ/under Rt breast or epigastric. No n/v. Some dizzy spells. Discussed and gave printed info on prevention/relief measures. Has had intermittent glucosuria. 2hr gtt was normal. No glucosuria today. Husband states she's been eating really bad, lots of cake, etc. No family h/o DM. Discussed possibly repeating GTT if has glucosuria again next visit.  BP normal today x 3 (once by nurse, twice by me), no proteinuria, trace edema, DTRs2+, no clonus Reviewed ptl s/s, pre-e s/s, fkc. To continue checking bp's QID, keep log, bring to next appt. Go straight to [facility] for pre-e s/s or severe range bp's.  Plan:  Continue routine obstetrical care  F/U Monday for OB appointment/bp check/gbs

## 2017-05-08 ENCOUNTER — Ambulatory Visit (INDEPENDENT_AMBULATORY_CARE_PROVIDER_SITE_OTHER): Payer: 59 | Admitting: Obstetrics and Gynecology

## 2017-05-08 ENCOUNTER — Encounter: Payer: Self-pay | Admitting: Obstetrics and Gynecology

## 2017-05-08 VITALS — BP 112/74 | HR 72 | Wt 188.2 lb

## 2017-05-08 DIAGNOSIS — Z331 Pregnant state, incidental: Secondary | ICD-10-CM

## 2017-05-08 DIAGNOSIS — Z3480 Encounter for supervision of other normal pregnancy, unspecified trimester: Secondary | ICD-10-CM

## 2017-05-08 DIAGNOSIS — Z1389 Encounter for screening for other disorder: Secondary | ICD-10-CM

## 2017-05-08 LAB — POCT URINALYSIS DIPSTICK
Blood, UA: NEGATIVE
Glucose, UA: NEGATIVE
Ketones, UA: NEGATIVE
Leukocytes, UA: NEGATIVE
Nitrite, UA: NEGATIVE
Protein, UA: NEGATIVE

## 2017-05-08 NOTE — Progress Notes (Signed)
[id number]  Estimated Date of Delivery: 05/31/17 [facility] [redacted]w[redacted]d  Chief Complaint  Patient presents with  . Routine Prenatal Visit  ____  Patient complaints: slightly decrease in weight, slight wetness in underwear but states this is not discharge nor does it feel like rupture of membranes. She also reports increased fetal movement to her lower pubic area.  Patient reports  good fetal movement,                           denies any bleeding , rupture of membranes,or regular contractions.  Blood pressure 112/74, pulse 72, weight 188 lb 3.2 oz (85.4 kg), last menstrual period 08/24/2016, unknown if currently breastfeeding.   Urine results: negative refer to the ob flow sheet for FH and FHR, ,                          Physical Examination: General appearance - alert, well appearing, and in no distress                                      Abdomen - FH 35 ,                                                         -FHR 126                                                         soft, nontender, nondistended, no masses or organomegaly                                      Pelvic - examination not indicated                                            Questions were answered. Assessment: LROB [id number] @ [redacted]w[redacted]d   Plan:  Continued routine obstetrical care,   F/u in 1 weeks for LROB  By signing my name below, I, Sonum Patel, attest that this documentation has been prepared under the direction and in the presence of Jonnie Kind, MD. Electronically Signed: Sonum Patel, Education administrator. 05/08/17. 9:49 AM.  I personally performed the services described in this documentation, which was SCRIBED in my presence. The recorded information has been reviewed and considered accurate. It has been edited as necessary during review. Jonnie Kind, MD

## 2017-05-10 ENCOUNTER — Telehealth: Payer: Self-pay | Admitting: Obstetrics & Gynecology

## 2017-05-10 NOTE — Telephone Encounter (Signed)
Pt called stating that she needs a note from the Doctor stating that she is out for maternity leave. Please contact pt

## 2017-05-10 NOTE — Telephone Encounter (Signed)
Pt called stating that her employer placed her on maternity leave effective Mon June 25,2018. Pt requests doctors note confirming maternity leave to be sent to her fmla advisor. Fax number received from patient. Informed pt that I would place a hard copy for her at the front desk. Pt verbalized understanding.

## 2017-05-11 ENCOUNTER — Ambulatory Visit (INDEPENDENT_AMBULATORY_CARE_PROVIDER_SITE_OTHER): Payer: 59 | Admitting: Women's Health

## 2017-05-11 ENCOUNTER — Encounter: Payer: Self-pay | Admitting: Women's Health

## 2017-05-11 ENCOUNTER — Telehealth: Payer: Self-pay | Admitting: *Deleted

## 2017-05-11 VITALS — BP 124/82 | HR 89 | Wt 190.0 lb

## 2017-05-11 DIAGNOSIS — R81 Glycosuria: Secondary | ICD-10-CM | POA: Diagnosis not present

## 2017-05-11 DIAGNOSIS — N898 Other specified noninflammatory disorders of vagina: Secondary | ICD-10-CM

## 2017-05-11 DIAGNOSIS — Z1389 Encounter for screening for other disorder: Secondary | ICD-10-CM

## 2017-05-11 DIAGNOSIS — O26893 Other specified pregnancy related conditions, third trimester: Secondary | ICD-10-CM | POA: Diagnosis not present

## 2017-05-11 DIAGNOSIS — Z331 Pregnant state, incidental: Secondary | ICD-10-CM

## 2017-05-11 DIAGNOSIS — Z3480 Encounter for supervision of other normal pregnancy, unspecified trimester: Secondary | ICD-10-CM

## 2017-05-11 LAB — POCT WET PREP (WET MOUNT)
Clue Cells Wet Prep Whiff POC: NEGATIVE
Trichomonas Wet Prep HPF POC: ABSENT

## 2017-05-11 LAB — POCT URINALYSIS DIPSTICK
Blood, UA: NEGATIVE
Ketones, UA: NEGATIVE
Leukocytes, UA: NEGATIVE
Nitrite, UA: NEGATIVE
Protein, UA: NEGATIVE

## 2017-05-11 LAB — GLUCOSE, POCT (MANUAL RESULT ENTRY): POC Glucose: 60 mg/dl — AB (ref 70–99)

## 2017-05-11 NOTE — Patient Instructions (Addendum)
Monistat 7 for yeast if needed  Call the office [phone]) or go to [facility] if:  You begin to have strong, frequent contractions  Your water breaks.  Sometimes it is a big gush of fluid, sometimes it is just a trickle that keeps getting your panties wet or running down your legs  You have vaginal bleeding.  It is normal to have a small amount of spotting if your cervix was checked.   You don't feel your baby moving like normal.  If you don't, get you something to eat and drink and lay down and focus on feeling your baby move.  You should feel at least 10 movements in 2 hours.  If you don't, you should call the office or go to [place] Contractions Contractions of the uterus can occur throughout pregnancy, but they are not always a sign that you are in labor. You may have practice contractions called Braxton Hicks contractions. These false labor contractions are sometimes confused with true labor. What are [place] contractions? Braxton Hicks contractions are tightening movements that occur in the muscles of the uterus before labor. Unlike true labor contractions, these contractions do not result in opening (dilation) and thinning of the cervix. Toward the end of pregnancy (32-34 weeks), Braxton Hicks contractions can happen more often and may become stronger. These contractions are sometimes difficult to tell apart from true labor because they can be very uncomfortable. You should not feel embarrassed if you go to the hospital with false labor. Sometimes, the only way to tell if you are in true labor is for your health care provider to look for changes in the cervix. The health care provider will do a physical exam and may monitor your contractions. If you are not in true labor, the exam should show that your cervix is not dilating and your water has not broken. If there are no prenatal problems or other health problems associated with your pregnancy, it is  completely safe for you to be sent home with false labor. You may continue to have Braxton Hicks contractions until you go into true labor. How can I tell the difference between true labor and false labor?  Differences ? False labor ? Contractions last 30-70 seconds.: Contractions are usually shorter and not as strong as true labor contractions. ? Contractions become very regular.: Contractions are usually irregular. ? Discomfort is usually felt in the top of the uterus, and it spreads to the lower abdomen and low back.: Contractions are often felt in the front of the lower abdomen and in the groin. ? Contractions do not go away with walking.: Contractions may go away when you walk around or change positions while lying down. ? Contractions usually become more intense and increase in frequency.: Contractions get weaker and are shorter-lasting as time goes on. ? The cervix dilates and gets thinner.: The cervix usually does not dilate or become thin. Follow these instructions at home:  Take over-the-counter and prescription medicines only as told by your health care provider.  Keep up with your usual exercises and follow other instructions from your health care provider.  Eat and drink lightly if you think you are going into labor.  If Braxton Hicks contractions are making you uncomfortable: ? Change your position from lying down or resting to walking, or change from walking to resting. ? Sit and rest in a tub of warm water. ? Drink enough fluid to keep your urine clear or  pale yellow. Dehydration may cause these contractions. ? Do slow and deep breathing several times an hour.  Keep all follow-up prenatal visits as told by your health care provider. This is important. Contact a health care provider if:  You have a fever.  You have continuous pain in your abdomen. Get help right away if:  Your contractions become stronger, more regular, and closer together.  You have fluid leaking or  gushing from your vagina.  You pass blood-tinged mucus (bloody show).  You have bleeding from your vagina.  You have low back pain that you never had before.  You feel your baby's head pushing down and causing pelvic pressure.  Your baby is not moving inside you as much as it used to. Summary  Contractions that occur before labor are called Braxton Hicks contractions, false labor, or practice contractions.  Braxton Hicks contractions are usually shorter, weaker, farther apart, and less regular than true labor contractions. True labor contractions usually become progressively stronger and regular and they become more frequent.  Manage discomfort from [facility] contractions by changing position, resting in a warm bath, drinking plenty of water, or practicing deep breathing. This information is not intended to replace advice given to you by your health care provider. Make sure you discuss any questions you have with your health care provider. Document Released: 10/31/2005 Document Revised: 09/19/2016 Document Reviewed: 09/19/2016 Elsevier Interactive Patient Education  2017 [employer].

## 2017-05-11 NOTE — Progress Notes (Signed)
Work-in  Low-risk OB appointment [id number] [redacted]w[redacted]d Estimated Date of Delivery: 05/31/17 BP 124/82   Pulse 89   Wt 190 lb (86.2 kg)   LMP 08/24/2016   BMI 35.90 kg/m   BP, weight, and urine reviewed.  Refer to obstetrical flow sheet for FH & FHR.  Reports good fm.  Denies regular uc's, vb, or uti s/s. Had small gush of fluid when taking a nap earlier- none since. 4+glucosuria today, cbg 60! Has only eaten a smoothie earlier today. Discussed w/ LHE, no need for any further testing.  SSE: cx visually closed, thick d/c, no pooling, no change w/ valsalva Wet prep: few yeast, can use otc monistat 7 Fern and nitrazine neg SVE: 3/70/-2, vtx Reviewed labor s/s, fkc. Plan:  Continue routine obstetrical care  F/U as scheduled for OB appointment

## 2017-05-11 NOTE — Telephone Encounter (Addendum)
Patient called stating she laid down to take a nap and had a large gush of fluid on her bed. She has been cramping but no major contractions, no bleeding. She is not currently leaking. Advised patient that if she continues to notice leaking to cancel her appointment and go to [facility].Will w/i with Cristina Davenport this afternoon to r/o rupture. Verbalized understanding.

## 2017-05-15 ENCOUNTER — Ambulatory Visit (INDEPENDENT_AMBULATORY_CARE_PROVIDER_SITE_OTHER): Payer: 59 | Admitting: Obstetrics & Gynecology

## 2017-05-15 ENCOUNTER — Encounter: Payer: Self-pay | Admitting: Obstetrics & Gynecology

## 2017-05-15 VITALS — BP 110/62 | HR 76 | Wt 189.0 lb

## 2017-05-15 DIAGNOSIS — R81 Glycosuria: Secondary | ICD-10-CM

## 2017-05-15 DIAGNOSIS — Z3A37 37 weeks gestation of pregnancy: Secondary | ICD-10-CM | POA: Diagnosis not present

## 2017-05-15 DIAGNOSIS — O26893 Other specified pregnancy related conditions, third trimester: Secondary | ICD-10-CM | POA: Diagnosis not present

## 2017-05-15 DIAGNOSIS — Z1389 Encounter for screening for other disorder: Secondary | ICD-10-CM

## 2017-05-15 DIAGNOSIS — Z3480 Encounter for supervision of other normal pregnancy, unspecified trimester: Secondary | ICD-10-CM

## 2017-05-15 DIAGNOSIS — Z331 Pregnant state, incidental: Secondary | ICD-10-CM

## 2017-05-15 LAB — POCT URINALYSIS DIPSTICK
Blood, UA: NEGATIVE
Ketones, UA: NEGATIVE
Leukocytes, UA: NEGATIVE
Nitrite, UA: NEGATIVE
Protein, UA: NEGATIVE

## 2017-05-15 LAB — GLUCOSE, POCT (MANUAL RESULT ENTRY): POC Glucose: 140 mg/dl — AB (ref 70–99)

## 2017-05-15 NOTE — Progress Notes (Signed)
[id number] [redacted]w[redacted]d Estimated Date of Delivery: 05/31/17  Blood pressure 110/62, pulse 76, weight 189 lb (85.7 kg), last menstrual period 08/24/2016, unknown if currently breastfeeding.   BP weight and urine results all reviewed and noted.  Please refer to the obstetrical flow sheet for the fundal height and fetal heart rate documentation:  Patient reports good fetal movement, denies any bleeding and no rupture of membranes symptoms or regular contractions. Patient is without complaints. All questions were answered.  Orders Placed This Encounter  Procedures  . US OB Follow Up  . POCT urinalysis dipstick  . POCT glucose (manual entry)    Plan:  Continued routine obstetrical care, 2.5/50/ball Pt had a bowl of Apple Jacks 90 minutes before her BS check, all testing has been normal But will do sonogram EFW 1 week to document EFW in this unusual clinical situation  Return in about 1 week (around 05/22/2017) for sonogram EFW, LROB.

## 2017-05-17 ENCOUNTER — Encounter (HOSPITAL_COMMUNITY): Payer: Self-pay | Admitting: *Deleted

## 2017-05-17 ENCOUNTER — Inpatient Hospital Stay (HOSPITAL_COMMUNITY)
Admission: AD | Admit: 2017-05-17 | Discharge: 2017-05-18 | DRG: 775 | Disposition: A | Discharge status: Home / Self Care | Payer: 59 | Source: Ambulatory Visit | Attending: Family Medicine | Admitting: Family Medicine

## 2017-05-17 ENCOUNTER — Inpatient Hospital Stay (HOSPITAL_COMMUNITY): Payer: 59 | Admitting: Anesthesiology

## 2017-05-17 DIAGNOSIS — O99344 Other mental disorders complicating childbirth: Secondary | ICD-10-CM | POA: Diagnosis present

## 2017-05-17 DIAGNOSIS — Z87891 Personal history of nicotine dependence: Secondary | ICD-10-CM | POA: Diagnosis not present

## 2017-05-17 DIAGNOSIS — G43909 Migraine, unspecified, not intractable, without status migrainosus: Secondary | ICD-10-CM | POA: Diagnosis present

## 2017-05-17 DIAGNOSIS — Z3A38 38 weeks gestation of pregnancy: Secondary | ICD-10-CM

## 2017-05-17 DIAGNOSIS — R87612 Low grade squamous intraepithelial lesion on cytologic smear of cervix (LGSIL): Secondary | ICD-10-CM | POA: Diagnosis present

## 2017-05-17 DIAGNOSIS — O26893 Other specified pregnancy related conditions, third trimester: Secondary | ICD-10-CM | POA: Diagnosis present

## 2017-05-17 DIAGNOSIS — Z3493 Encounter for supervision of normal pregnancy, unspecified, third trimester: Secondary | ICD-10-CM | POA: Diagnosis present

## 2017-05-17 DIAGNOSIS — F419 Anxiety disorder, unspecified: Secondary | ICD-10-CM | POA: Diagnosis present

## 2017-05-17 DIAGNOSIS — O09299 Supervision of pregnancy with other poor reproductive or obstetric history, unspecified trimester: Secondary | ICD-10-CM

## 2017-05-17 DIAGNOSIS — R81 Glycosuria: Secondary | ICD-10-CM | POA: Diagnosis present

## 2017-05-17 DIAGNOSIS — Z91040 Latex allergy status: Secondary | ICD-10-CM | POA: Diagnosis not present

## 2017-05-17 LAB — CBC
HCT: 39.4 % (ref 36.0–46.0)
Hemoglobin: 13.6 g/dL (ref 12.0–15.0)
MCH: 29.9 pg (ref 26.0–34.0)
MCHC: 34.5 g/dL (ref 30.0–36.0)
MCV: 86.6 fL (ref 78.0–100.0)
Platelets: 217 10*3/uL (ref 150–400)
RBC: 4.55 MIL/uL (ref 3.87–5.11)
RDW: 13.8 % (ref 11.5–15.5)
WBC: 11.2 10*3/uL — ABNORMAL HIGH (ref 4.0–10.5)

## 2017-05-17 LAB — TYPE AND SCREEN
ABO/RH(D): A POS
Antibody Screen: NEGATIVE

## 2017-05-17 LAB — POCT FERN TEST: POCT Fern Test: POSITIVE

## 2017-05-17 LAB — RPR: RPR Ser Ql: NONREACTIVE

## 2017-05-17 MED ORDER — LIDOCAINE HCL (PF) 1 % IJ SOLN
30.0000 mL | INTRAMUSCULAR | Status: DC | PRN
  Filled 2017-05-17: qty 30

## 2017-05-17 MED ORDER — LIDOCAINE HCL (PF) 1 % IJ SOLN
INTRAMUSCULAR | Status: DC | PRN
  Administered 2017-05-17: 13 mL via EPIDURAL

## 2017-05-17 MED ORDER — OXYCODONE-ACETAMINOPHEN 5-325 MG PO TABS: 2.0000 | ORAL_TABLET | ORAL | Status: DC | PRN

## 2017-05-17 MED ORDER — PHENYLEPHRINE 40 MCG/ML (10ML) SYRINGE FOR IV PUSH (FOR BLOOD PRESSURE SUPPORT)
80.0000 ug | PREFILLED_SYRINGE | INTRAVENOUS | Status: DC | PRN
  Filled 2017-05-17: qty 10
  Filled 2017-05-17: qty 5

## 2017-05-17 MED ORDER — DIPHENHYDRAMINE HCL 50 MG/ML IJ SOLN: 12.5000 mg | INTRAMUSCULAR | Status: DC | PRN

## 2017-05-17 MED ORDER — SIMETHICONE 80 MG PO CHEW: 80.0000 mg | CHEWABLE_TABLET | ORAL | Status: DC | PRN

## 2017-05-17 MED ORDER — EPHEDRINE 5 MG/ML INJ
10.0000 mg | INTRAVENOUS | Status: DC | PRN
  Filled 2017-05-17: qty 2

## 2017-05-17 MED ORDER — OXYCODONE-ACETAMINOPHEN 5-325 MG PO TABS: 1.0000 | ORAL_TABLET | ORAL | Status: DC | PRN

## 2017-05-17 MED ORDER — OXYCODONE-ACETAMINOPHEN 5-325 MG PO TABS
2.0000 | ORAL_TABLET | ORAL | Status: DC | PRN
Start: 2017-05-17 — End: 2017-05-19

## 2017-05-17 MED ORDER — LACTATED RINGERS IV SOLN
500.0000 mL | INTRAVENOUS | Status: DC | PRN
Start: 2017-05-17 — End: 2017-05-17

## 2017-05-17 MED ORDER — OXYTOCIN 40 UNITS IN LACTATED RINGERS INFUSION - SIMPLE MED
2.5000 [IU]/h | INTRAVENOUS | Status: DC
  Filled 2017-05-17: qty 1000

## 2017-05-17 MED ORDER — LACTATED RINGERS IV SOLN
INTRAVENOUS | Status: DC
  Administered 2017-05-17: 09:00:00 via INTRAVENOUS

## 2017-05-17 MED ORDER — FENTANYL 2.5 MCG/ML BUPIVACAINE 1/10 % EPIDURAL INFUSION (WH - ANES)
14.0000 mL/h | INTRAMUSCULAR | Status: DC | PRN
  Administered 2017-05-17: 14 mL/h via EPIDURAL
  Filled 2017-05-17: qty 100

## 2017-05-17 MED ORDER — ONDANSETRON HCL 4 MG/2ML IJ SOLN
4.0000 mg | Freq: Four times a day (QID) | INTRAMUSCULAR | Status: DC | PRN
  Filled 2017-05-17: qty 2

## 2017-05-17 MED ORDER — PHENYLEPHRINE 40 MCG/ML (10ML) SYRINGE FOR IV PUSH (FOR BLOOD PRESSURE SUPPORT)
80.0000 ug | PREFILLED_SYRINGE | INTRAVENOUS | Status: DC | PRN
  Filled 2017-05-17: qty 5

## 2017-05-17 MED ORDER — COCONUT OIL OIL
1.0000 "application " | TOPICAL_OIL | Status: DC | PRN
  Administered 2017-05-18: 1 via TOPICAL
  Filled 2017-05-17: qty 120

## 2017-05-17 MED ORDER — LACTATED RINGERS IV SOLN: 500.0000 mL | Freq: Once | INTRAVENOUS | Status: DC

## 2017-05-17 MED ORDER — DIPHENHYDRAMINE HCL 25 MG PO CAPS: 25.0000 mg | ORAL_CAPSULE | Freq: Four times a day (QID) | ORAL | Status: DC | PRN

## 2017-05-17 MED ORDER — DOCUSATE SODIUM 100 MG PO CAPS
100.0000 mg | ORAL_CAPSULE | Freq: Two times a day (BID) | ORAL | Status: DC
  Administered 2017-05-17 – 2017-05-18 (×2): 100 mg via ORAL
  Filled 2017-05-17 (×2): qty 1

## 2017-05-17 MED ORDER — ONDANSETRON HCL 4 MG/2ML IJ SOLN: 4.0000 mg | INTRAMUSCULAR | Status: DC | PRN

## 2017-05-17 MED ORDER — SOD CITRATE-CITRIC ACID 500-334 MG/5ML PO SOLN: 30.0000 mL | ORAL | Status: DC | PRN

## 2017-05-17 MED ORDER — ACETAMINOPHEN 325 MG PO TABS: 650.0000 mg | ORAL_TABLET | ORAL | Status: DC | PRN

## 2017-05-17 MED ORDER — FLEET ENEMA 7-19 GM/118ML RE ENEM: 1.0000 | ENEMA | RECTAL | Status: DC | PRN

## 2017-05-17 MED ORDER — OXYTOCIN BOLUS FROM INFUSION
500.0000 mL | Freq: Once | INTRAVENOUS | Status: AC
  Administered 2017-05-17: 500 mL via INTRAVENOUS

## 2017-05-17 MED ORDER — IBUPROFEN 600 MG PO TABS
600.0000 mg | ORAL_TABLET | Freq: Four times a day (QID) | ORAL | Status: DC
  Administered 2017-05-17 – 2017-05-18 (×5): 600 mg via ORAL
  Filled 2017-05-17 (×5): qty 1

## 2017-05-17 MED ORDER — WITCH HAZEL-GLYCERIN EX PADS: 1.0000 "application " | MEDICATED_PAD | CUTANEOUS | Status: DC | PRN

## 2017-05-17 MED ORDER — PRENATAL MULTIVITAMIN CH
1.0000 | ORAL_TABLET | Freq: Every day | ORAL | Status: DC
  Administered 2017-05-18: 1 via ORAL
  Filled 2017-05-17: qty 1

## 2017-05-17 MED ORDER — BENZOCAINE-MENTHOL 20-0.5 % EX AERO
1.0000 "application " | INHALATION_SPRAY | CUTANEOUS | Status: DC | PRN
  Administered 2017-05-17: 1 via TOPICAL
  Filled 2017-05-17: qty 56

## 2017-05-17 MED ORDER — ONDANSETRON HCL 4 MG PO TABS
4.0000 mg | ORAL_TABLET | ORAL | Status: DC | PRN
Start: 2017-05-17 — End: 2017-05-19

## 2017-05-17 MED ORDER — DIBUCAINE 1 % RE OINT
1.0000 | TOPICAL_OINTMENT | RECTAL | Status: DC | PRN
Start: 2017-05-17 — End: 2017-05-19

## 2017-05-17 NOTE — H&P (Signed)
Cristina Davenport is a 31 y.o. [id number] female at [redacted]w[redacted]d by LMP c/w 12wk u/s, presenting w/ SROM clear fluid @ 0650, uc's began after.   Reports active fetal movement, contractions: regular, vaginal bleeding: none, membranes: ruptured, clear fluid. Initiated prenatal care at [facility] at 8wks, then transferred to [facility] at 23 wks.    This pregnancy complicated by: Intermittent glucosuria 3-4+ w/ normal 2hr GTT  Prenatal History/Complications:  H/o pre-e w/ IOL  @ 36wks  Past Medical History: Past Medical History:  Diagnosis Date  . Anxiety   . Anxiety   . Bipolar 1 disorder ([place])   . GERD (gastroesophageal reflux disease)   . Hypertension    PIH  . IBS (irritable bowel syndrome)   . Migraine without aura     Past Surgical History: Past Surgical History:  Procedure Laterality Date  . ABDOMINAL SURGERY     cyst removed   . CHOLECYSTECTOMY N/A 07/10/2013   Procedure: LAPAROSCOPIC CHOLECYSTECTOMY;  Surgeon: Donato Heinz, MD;  Location: [facility];  Service: General;  Laterality: N/A;  . ESOPHAGOGASTRODUODENOSCOPY N/A 04/30/2013   DGU:[id number] distal esophagus of uncertain clinical significance-status post biopsy. Tiny hiatal hernia. No explanation  . EXAMINATION UNDER ANESTHESIA  04/21/2012   Gluteal wound repair  . WISDOM TOOTH EXTRACTION      Obstetrical History: OB History    Gravida Para Term Preterm AB Living   3 1 0 1 1 1    SAB TAB Ectopic Multiple Live Births   1 0 0 0 1      Social History: Social History   Social History  . Marital status: Single    Spouse name: N/A  . Number of children: 1  . Years of education: 58   Occupational History  . [employer]    Social History Main Topics  . Smoking status: Former Smoker    Packs/day: 0.00    Years: 8.00    Quit date: 09/19/2014  . Smokeless tobacco: Never Used  . Alcohol use No     Comment: occasionally,  social  . Drug use: No  . Sexual activity: Not Currently    Partners: Male   Birth control/ protection: None   Other Topics Concern  . 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 Mental illness Mother   . Cancer Father        skin cancer  . Hypertension Maternal Grandmother   . Stroke Maternal Grandmother   . Kidney failure Maternal Grandmother   . Hypertension Maternal Grandfather   . Stroke Maternal Grandfather   . Cancer Maternal Grandfather        skin  . Crohn's disease Maternal Aunt   . Crohn's disease Cousin   . Colon cancer Neg Hx     Allergies: Allergies  Allergen Reactions  . Latex Other (See Comments)    General irritation  . Doxycycline Rash    Solar rash    Prescriptions Prior to Admission  Medication Sig Dispense Refill Last Dose  . calcium carbonate (TUMS - DOSED IN MG ELEMENTAL CALCIUM) 500 MG chewable tablet Chew 1 tablet by mouth 4 (four) times daily as needed for indigestion or heartburn.   05/16/2017 at Unknown time  . clindamycin (CLEOCIN T) 1 % lotion Apply 1 application topically 2 (two) times daily. 60 mL 0 05/16/2017  at Unknown time  . Cyanocobalamin (B-12 PO) Take 1 tablet by mouth at bedtime.   05/16/2017 at Unknown time  . doxylamine, Sleep, (UNISOM) 25 MG tablet Take 25 mg by mouth at bedtime as needed for sleep (and nausea).    05/16/2017 at Unknown time  . escitalopram (LEXAPRO) 20 MG tablet Take 20 mg by mouth daily.  6 05/16/2017 at Unknown time  . Prenatal Vit-Fe Fumarate-FA (PRENATAL MULTIVITAMIN) TABS tablet Take 1 tablet by mouth daily at 12 noon.   Past Week at Unknown time    Review of Systems  Pertinent pos/neg as indicated in HPI  Blood pressure 107/76, pulse 80, temperature 98.2 F (36.8 C), temperature source Axillary, resp. rate 18, height 5\' 1"  (1.549 m), weight 85.7 kg (189 lb), last menstrual period 08/24/2016, SpO2 98 %, unknown if currently breastfeeding. General appearance: alert,  cooperative and no distress Lungs: clear to auscultation bilaterally Heart: regular rate and rhythm Abdomen: gravid, soft, non-tender Extremities: no edema DTR's 2+  Fetal monitoring: FHR: 120 bpm, variability: moderate,  Accelerations: Present,  decelerations:  Absent Uterine activity: regular SVE: 4/60/-2, vtx   Prenatal labs: ABO, Rh: --/--/A POS (07/04 8453) Antibody: NEG (07/04 0835) Rubella: !Error! RPR: Non Reactive (07/04 0835)  HBsAg: Negative (12/06 0000)  HIV: Non Reactive (04/20 0909)  GBS: Negative (06/20 1630)   2hr GTT: 67/99/93 Genetic screening:  Neg nt/it Anatomy US: female, isolated Lt EICF, otherwise neg  Results for orders placed or performed during the hospital encounter of 05/17/17 (from the past 24 hour(s))  POCT fern test   Collection Time: 05/17/17  8:08 AM  Result Value Ref Range   POCT Fern Test Positive = ruptured amniotic membanes   CBC   Collection Time: 05/17/17  8:35 AM  Result Value Ref Range   WBC 11.2 (H) 4.0 - 10.5 K/uL   RBC 4.55 3.87 - 5.11 MIL/uL   Hemoglobin 13.6 12.0 - 15.0 g/dL   HCT 39.4 36.0 - 46.0 %   MCV 86.6 78.0 - 100.0 fL   MCH 29.9 26.0 - 34.0 pg   MCHC 34.5 30.0 - 36.0 g/dL   RDW 13.8 11.5 - 15.5 %   Platelets 217 150 - 400 K/uL  RPR   Collection Time: 05/17/17  8:35 AM  Result Value Ref Range   RPR Ser Ql Non Reactive Non Reactive  Type and screen [place]   Collection Time: 05/17/17  8:35 AM  Result Value Ref Range   ABO/RH(D) A POS    Antibody Screen NEG    Sample Expiration 05/20/2017      Assessment:  [redacted]w[redacted]d SIUP  [id number]  SROM/SOL  Intermittent glucosuria  Cat 1 FHR  GBS Negative (06/20 1630)  Plan:  Admit to BS  IV pain meds/epidural prn active labor  Expectant management  Anticipate NSVB   Plans to breastfeed  Contraception: undecided  Circumcision: @ FT  Tawnya Crook CNM, [facility] 05/17/2017, 6:22 PM

## 2017-05-17 NOTE — Anesthesia Procedure Notes (Signed)
Epidural Patient location during procedure: OB Start time: 05/17/2017 9:13 AM End time: 05/17/2017 9:29 AM  Staffing Anesthesiologist: Candida Peeling RAY Performed: anesthesiologist   Preanesthetic Checklist Completed: patient identified, site marked, surgical consent, pre-op evaluation, timeout performed, IV checked, risks and benefits discussed and monitors and equipment checked  Epidural Patient position: sitting Prep: DuraPrep Patient monitoring: heart rate, cardiac monitor, continuous pulse ox and blood pressure Approach: midline Location: L2-L3 Injection technique: LOR saline  Needle:  Needle type: Tuohy  Needle gauge: 17 G Needle length: 9 cm Needle insertion depth: 6 cm Catheter type: closed end flexible Catheter size: 20 Guage Catheter at skin depth: 9 cm Test dose: negative  Assessment Events: blood not aspirated, injection not painful, no injection resistance, negative IV test and no paresthesia  Additional Notes Reason for block:procedure for pain

## 2017-05-17 NOTE — Anesthesia Pain Management Evaluation Note (Signed)
  CRNA Pain Management Visit Note  Patient: Cristina Davenport, 31 y.o., female  "Hello I am a member of the anesthesia team at [facility]. We have an anesthesia team available at all times to provide care throughout the hospital, including epidural management and anesthesia for C-section. I don't know your plan for the delivery whether it a natural birth, water birth, IV sedation, nitrous supplementation, doula or epidural, but we want to meet your pain goals."   1.Was your pain managed to your expectations on prior hospitalizations?   Yes   2.What is your expectation for pain management during this hospitalization?     Epidural  3.How can we help you reach that goal? Epidural when able.  Record the patient's initial score and the patient's pain goal.   Pain: 8  Pain Goal: 8. Patient being admitted with labs drawn. L&D RNs aware of patient's desire for epidural. The [facility] wants you to be able to say your pain was always managed very well.  Mikle Sternberg L 05/17/2017

## 2017-05-17 NOTE — Anesthesia Postprocedure Evaluation (Signed)
Anesthesia Post Note  Patient: Cristina Davenport  Procedure(s) Performed: * No procedures listed *     Patient location during evaluation: Mother Baby Anesthesia Type: Epidural Level of consciousness: awake and alert Pain management: satisfactory to patient Vital Signs Assessment: post-procedure vital signs reviewed and stable Respiratory status: respiratory function stable Cardiovascular status: stable Postop Assessment: no headache, no backache, epidural receding, patient able to bend at knees, no signs of nausea or vomiting and adequate PO intake Anesthetic complications: no    Last Vitals:  Vitals:   05/17/17 1401 05/17/17 1504  BP: 128/77 122/67  Pulse: 70 96  Resp: 16 18  Temp:  36.9 C    Last Pain:  Vitals:   05/17/17 1504  TempSrc: Tympanic  PainSc:    Pain Goal: Patients Stated Pain Goal: 0 (05/17/17 0817)               Katherina Mires

## 2017-05-17 NOTE — Anesthesia Preprocedure Evaluation (Signed)
Anesthesia Evaluation  Patient identified by MRN, date of birth, ID band Patient awake    Reviewed: Allergy & Precautions, H&P , NPO status , Patient's Chart, lab work & pertinent test results, reviewed documented beta blocker date and time   History of Anesthesia Complications (+) Emergence Delirium and history of anesthetic complications  Airway Mallampati: I  TM Distance: >3 FB Neck ROM: full    Dental  (+) Teeth Intact   Pulmonary Current Smoker, former smoker,    breath sounds clear to auscultation       Cardiovascular Exercise Tolerance: Good hypertension, negative cardio ROS   Rhythm:Regular Rate:Normal     Neuro/Psych  Headaches, PSYCHIATRIC DISORDERS Anxiety Bipolar Disorder    GI/Hepatic negative GI ROS, Neg liver ROS, GERD  Medicated,  Endo/Other  negative endocrine ROS  Renal/GU      Musculoskeletal   Abdominal   Peds  Hematology   Anesthesia Other Findings   Reproductive/Obstetrics (+) Pregnancy                             Anesthesia Physical  Anesthesia Plan  ASA: II  Anesthesia Plan: Epidural   Post-op Pain Management:    Induction:   PONV Risk Score and Plan:   Airway Management Planned:   Additional Equipment:   Intra-op Plan:   Post-operative Plan:   Informed Consent: I have reviewed the patients History and Physical, chart, labs and discussed the procedure including the risks, benefits and alternatives for the proposed anesthesia with the patient or authorized representative who has indicated his/her understanding and acceptance.     Plan Discussed with:   Anesthesia Plan Comments:         Anesthesia Quick Evaluation

## 2017-05-17 NOTE — MAU Note (Signed)
Started gushing at 0650, clear fluid mixed with blood.  Started contracting on the way here.  Coming every 2-42min. Was 2 when last checked.

## 2017-05-17 NOTE — Lactation Note (Signed)
This note was copied from a baby's chart. Lactation Consultation Note  Patient Name: Cristina Davenport [id number] Date: 05/17/2017 Reason for consult: Initial assessment  Initial visit at 56 hours of age. Mom reports baby has been sleepy after a few good feedings.  Mom noted to have large soft breast with semi evert nipples and slight bruising noted on right nipple tip.  Mom has easily expressed colostrum and mom can return demonstration after teaching.  LC offered to assist latching baby,  [place] undressed sleepy baby and place in football with mom STS.  Baby did not wake for feeding and had several drops of colostrum applied to mouth.   LC offered to assist with hand expression for feeding later.  LC obtained 1/2 colostrum container,  Mom aware to give to baby within 8 hours.  RN as bedside aware of EBM for later feeding as well. [facility] LC resources given and discussed.  Encouraged to feed with early cues on demand.  Early newborn behavior discussed.   Mom to call for assist as needed.     Maternal Data Has patient been taught Hand Expression?: Yes Does the patient have breastfeeding experience prior to this delivery?: Yes  Feeding Feeding Type: Breast Fed Length of feed: 10 min  LATCH Score/Interventions Latch: Too sleepy or reluctant, no latch achieved, no sucking elicited. Intervention(s): Skin to skin;Teach feeding cues;Waking techniques Intervention(s): Breast massage;Breast compression;Assist with latch;Adjust position  Audible Swallowing: None  Type of Nipple: Flat Intervention(s): No intervention needed  Comfort (Breast/Nipple): Soft / non-tender     Hold (Positioning): Assistance needed to correctly position infant at breast and maintain latch. Intervention(s): Breastfeeding basics reviewed;Support Pillows;Position options;Skin to skin  LATCH Score: 4  Lactation Tools Discussed/Used     Consult Status Consult Status: Follow-up Date: 05/18/17 Follow-up type:  In-patient    Harleigh Civello, Justine Null 05/17/2017, 11:38 PM

## 2017-05-18 ENCOUNTER — Encounter (HOSPITAL_COMMUNITY): Payer: Self-pay | Admitting: *Deleted

## 2017-05-18 MED ORDER — IBUPROFEN 600 MG PO TABS: 600.0000 mg | ORAL_TABLET | Freq: Four times a day (QID) | ORAL | 0 refills | Status: DC | PRN

## 2017-05-18 NOTE — Lactation Note (Addendum)
This note was copied from a baby's chart. Lactation Consultation Note  Baby 27 hours old and mother's nipple are sore. She has short shaft nipples.  L side appears to have blisters. Assisted w/ latching in cross cradle hold.  Had mother move fingers out of the way to gain more depth. Suggest she compress breast instead.  Baby has posterior lingual tightness. Had mother prepump before latching and wait until baby opens wide. Mother has been using her own nipple cream and coconut oil for soreness. Provided her with shells to wear to help evert nipples and prevent rubbing. Discussed basics and cluster feeding and APNO if needed. Mom encouraged to feed baby 8-12 times/24 hours and with feeding cues at least q  3 hours since he is close to 5lbs.  Wake baby for feedings if needed. Reviewed engorgement care and monitoring voids/stools. Provided mother with hand pump to prepump nipples before latching.  Patient Name: Cristina Davenport [id number] Date: 05/18/2017     Maternal Data    Feeding    LATCH Score/Interventions                      Lactation Tools Discussed/Used     Consult Status      Vivianne Master [facility] 05/18/2017, 4:47 PM

## 2017-05-18 NOTE — Discharge Summary (Signed)
OB Discharge Summary     Patient Name: Cristina Davenport DOB: [birth year] MRN: [id number]  Date of admission: 05/17/2017 Delivering MD: Lauretta Chester NILES   Date of discharge: 05/18/2017  Admitting diagnosis: 38wks water broke contractions 57mins Intrauterine pregnancy: [redacted]w[redacted]d     Secondary diagnosis:  Principal Problem:   Normal spontaneous vaginal delivery Active Problems:   Low grade squamous intraepithelial lesion on cytologic smear of cervix (LGSIL)   Anxiety   Migraine headache   Hx of preeclampsia, prior pregnancy, currently pregnant   Indication for care in labor or delivery  Additional problems: none     Discharge diagnosis: Term Pregnancy Delivered                                                                                                Post partum procedures:none  Augmentation: none  Complications: None  Hospital course:  Onset of Labor With Vaginal Delivery     31 y.o. yo [id number] at [redacted]w[redacted]d was admitted in Latent Labor on 05/17/2017. Patient had an uncomplicated labor course as follows:  Membrane Rupture Time/Date: 6:50 AM ,05/17/2017   Intrapartum Procedures: Episiotomy: None [1]                                         Lacerations:  None [1]  Patient had a delivery of a Viable infant. 05/17/2017  Information for the patient's newborn:  Dalayza, Zambrana [[id number]]  Delivery Method: Vaginal, Spontaneous Delivery (Filed from Delivery Summary)    Pateint had an uncomplicated postpartum course.  She is ambulating, tolerating a regular diet, passing flatus, and urinating well. Patient is discharged home in stable condition on 05/18/17.   Physical exam  Vitals:   05/17/17 1504 05/17/17 1607 05/17/17 2300 05/18/17 0432  BP: 122/67 107/76 116/64 117/63  Pulse: 96 80 75 70  Resp: 18 18 16 16   Temp: 98.4 F (36.9 C) 98.2 F (36.8 C) 97.9 F (36.6 C) 97.8 F (36.6 C)  TempSrc: Tympanic Axillary Oral Oral  SpO2:  98%    Weight:      Height:       General:  alert and cooperative Lochia: appropriate Uterine Fundus: firm Incision: N/A DVT Evaluation: No evidence of DVT seen on physical exam. Labs: Lab Results  Component Value Date   WBC 11.2 (H) 05/17/2017   HGB 13.6 05/17/2017   HCT 39.4 05/17/2017   MCV 86.6 05/17/2017   PLT 217 05/17/2017   CMP Latest Ref Rng & Units 05/03/2017  Glucose 65 - 99 mg/dL 81  BUN 6 - 20 mg/dL 7  Creatinine 0.57 - 1.00 mg/dL 0.64  Sodium 134 - 144 mmol/L 139  Potassium 3.5 - 5.2 mmol/L 4.4  Chloride 96 - 106 mmol/L 104  CO2 20 - 29 mmol/L 19(L)  Calcium 8.7 - 10.2 mg/dL 9.3  Total Protein 6.0 - 8.5 g/dL 6.1  Total Bilirubin 0.0 - 1.2 mg/dL <0.2  Alkaline Phos 39 - 117 IU/L 116  AST 0 - 40 IU/L  16  ALT 0 - 32 IU/L 15    Discharge instruction: per After Visit Summary and "Baby and Me Booklet".  After visit meds:  Allergies as of 05/18/2017      Reactions   Latex Other (See Comments)   General irritation   Doxycycline Rash   Solar rash      Medication List    STOP taking these medications   B-12 PO   calcium carbonate 500 MG chewable tablet Commonly known as:  TUMS - dosed in mg elemental calcium   clindamycin 1 % lotion Commonly known as:  CLEOCIN T   doxylamine (Sleep) 25 MG tablet Commonly known as:  UNISOM     TAKE these medications   escitalopram 20 MG tablet Commonly known as:  LEXAPRO Take 20 mg by mouth daily.   ibuprofen 600 MG tablet Commonly known as:  ADVIL,MOTRIN Take 1 tablet (600 mg total) by mouth every 6 (six) hours as needed.   prenatal multivitamin Tabs tablet Take 1 tablet by mouth daily at 12 noon.       Diet: routine diet  Activity: Advance as tolerated. Pelvic rest for 6 weeks.   Outpatient follow up:4 weeks Follow up Appt:Future Appointments Date Time Provider [place]  06/22/2017 10:30 AM Roma Schanz, CNM [facility]   Follow up Visit:No Follow-up on file.  Postpartum contraception: Undecided  Newborn Data: Live born  female  Birth Weight: 6 lb 6.5 oz (2905 g) APGAR: 8, 9  Baby Feeding: Breast Disposition:home with mother   05/18/2017 Serita Grammes, CNM  9:24 AM

## 2017-05-18 NOTE — Progress Notes (Signed)
MOB was referred for history of depression/anxiety. * Referral screened out by Clinical Social Worker because none of the following criteria appear to apply: ~ History of anxiety/depression during this pregnancy, or of post-partum depression. ~ Diagnosis of anxiety and/or depression within last 3 years OR * MOB's symptoms currently being treated with medication and/or therapy.  CSW provided education regarding Baby Blues vs PMADs and provided MOB with information about support groups held at [facility] encouraged MOB to evaluate her mental health throughout the postpartum period with the use of the New Mom Checklist developed by Postpartum [facility] and notify a medical professional if symptoms arise.   CSW identifies no further need for intervention at this time or barriers to discharge.  Laurey Arrow, MSW, LCSW Clinical Social Work [phone]

## 2017-05-18 NOTE — Discharge Instructions (Signed)

## 2017-05-19 ENCOUNTER — Telehealth: Payer: Self-pay | Admitting: *Deleted

## 2017-05-19 MED ORDER — CLINDAMYCIN PHOSPHATE 1 % EX LOTN: TOPICAL_LOTION | Freq: Two times a day (BID) | CUTANEOUS | 0 refills | Status: DC

## 2017-05-19 MED ORDER — ESCITALOPRAM OXALATE 20 MG PO TABS: 20.0000 mg | ORAL_TABLET | Freq: Every day | ORAL | 6 refills | Status: DC

## 2017-05-19 NOTE — Telephone Encounter (Signed)
Patient called requesting refill on Lexapro and clindamycin gel for face for acne.

## 2017-05-22 ENCOUNTER — Encounter: Payer: 59 | Admitting: Obstetrics and Gynecology

## 2017-05-22 ENCOUNTER — Other Ambulatory Visit: Payer: 59

## 2017-05-22 DIAGNOSIS — Z029 Encounter for administrative examinations, unspecified: Secondary | ICD-10-CM

## 2017-05-24 ENCOUNTER — Encounter (HOSPITAL_COMMUNITY): Payer: Self-pay | Admitting: *Deleted

## 2017-06-19 ENCOUNTER — Telehealth: Payer: Self-pay | Admitting: Women's Health

## 2017-06-19 NOTE — Telephone Encounter (Signed)
Pt called requesting refill on Lexapro. Informed pt that 6 refills had been ordered on this medication. Pt verbalized understanding.

## 2017-06-22 ENCOUNTER — Ambulatory Visit (INDEPENDENT_AMBULATORY_CARE_PROVIDER_SITE_OTHER): Payer: 59 | Admitting: Advanced Practice Midwife

## 2017-06-22 ENCOUNTER — Ambulatory Visit: Payer: 59 | Admitting: Women's Health

## 2017-06-22 ENCOUNTER — Encounter: Payer: Self-pay | Admitting: Advanced Practice Midwife

## 2017-06-22 NOTE — Progress Notes (Signed)
Cristina Davenport is a 31 y.o. who presents for a postpartum visit. She is 5 weeks postpartum following a spontaneous vaginal delivery. I have fully reviewed the prenatal and intrapartum course. The delivery was at 26 gestational weeks.  Anesthesia: epidural. Postpartum course has been uneventful. Baby's course has been uneventful. Baby is feeding by both. Bleeding: staining only. Bowel function is normal. Bladder function is normal. Patient is not sexually active. Contraception method is abstinence. Postpartum depression screening: negative. She is on lexapro (since before pregnancy)   Current Outpatient Prescriptions:  .  clindamycin (CLEOCIN T) 1 % lotion, Apply topically 2 (two) times daily., Disp: 60 mL, Rfl: 0 .  escitalopram (LEXAPRO) 20 MG tablet, Take 1 tablet (20 mg total) by mouth daily., Disp: 30 tablet, Rfl: 6 .  Prenatal Vit-Fe Fumarate-FA (PRENATAL MULTIVITAMIN) TABS tablet, Take 1 tablet by mouth daily at 12 noon., Disp: , Rfl:  .  ibuprofen (ADVIL,MOTRIN) 600 MG tablet, Take 1 tablet (600 mg total) by mouth every 6 (six) hours as needed. (Patient not taking: Reported on 06/22/2017), Disp: 30 tablet, Rfl: 0  Review of Systems   Constitutional: Negative for fever and chills Eyes: Negative for visual disturbances Respiratory: Negative for shortness of breath, dyspnea Cardiovascular: Negative for chest pain or palpitations  Gastrointestinal: Negative for vomiting, diarrhea and constipation Genitourinary: Negative for dysuria and urgency Musculoskeletal: Negative for back pain, joint pain, myalgias  Neurological: Negative for dizziness and headaches    Objective:    There were no vitals filed for this visit. General:  alert, cooperative and no distress   Breasts:  negative  Lungs: clear to auscultation bilaterally  Heart:  regular rate and rhythm  Abdomen: Soft, nontender   Vulva:  normal  Vagina: normal vagina  Cervix:  closed  Corpus: Well involuted     Rectal Exam: no  hemorrhoids        Assessment:    normal postpartum exam.  Plan:   1. Contraception: abstinence 2. Follow up in:   or as needed.

## 2017-08-03 ENCOUNTER — Ambulatory Visit (INDEPENDENT_AMBULATORY_CARE_PROVIDER_SITE_OTHER): Payer: 59 | Admitting: Women's Health

## 2017-08-03 ENCOUNTER — Encounter: Payer: Self-pay | Admitting: Women's Health

## 2017-08-03 VITALS — BP 124/82 | HR 82 | Ht 61.0 in | Wt 163.0 lb

## 2017-08-03 DIAGNOSIS — L739 Follicular disorder, unspecified: Secondary | ICD-10-CM

## 2017-08-03 MED ORDER — SULFAMETHOXAZOLE-TRIMETHOPRIM 800-160 MG PO TABS: 1.0000 | ORAL_TABLET | Freq: Two times a day (BID) | ORAL | 0 refills | Status: DC

## 2017-08-04 NOTE — Progress Notes (Signed)
   [facility] Visit  Patient name: Cristina Davenport MRN [id number]  Date of birth: 24-Jul-1986 CC & HPI:  Cristina Davenport is a 31 y.o. [phone] Caucasian female being seen today for report of bump on groin. Bump is on Rt mons near crease of leg, has been there for a few years, flares up at times but has never come to a head. For past few weeks, has been leaking some drainage and bleeding some when underwear or clothes irritate it. Has been tender. Has applied triple antibiotic ointment which helps the area to improve some, but doesn't completely go away. Denies fever/chills, etc.  Breastfeeding.   No LMP recorded. Patient is not currently having periods (Reason: Lactating). The current method of family planning is none. Last pap 11/04/16 neg @ [facility]  Review of Systems:   Patient denies any headaches, hearing loss, fatigue, blurred vision, shortness of breath, chest pain, abdominal pain, problems with bowel movements, urination, or intercourse. No joint pain or mood swings.  Pertinent History Reviewed:  Reviewed past medical,surgical and family history.  Reviewed problem list, medications and allergies.  Objective Findings:   Vitals:   08/03/17 1531  BP: 124/82  Pulse: 82  Weight: 163 lb (73.9 kg)  Height: 5\' 1"  (1.549 m)    Body mass index is 30.8 kg/m.  Physical Examination: General appearance - well appearing, and in no distress Mental status - alert, oriented to person, place, and time Physical Examination: ~1cm round area Rt lateral mons near crease, small open area/crater in center, oozing small amt clear to slightly purulent drainage, slightly tender to touch. Co-exam w/ JVF- feels is chronic folliculitis- recommends 2wks of bactrim, if not improved make appt for excision  No results found for this or any previous visit (from the past 24 hour(s)).   Assessment & Plan:   1) Chronic folliculitis Rt mons> rx Bactrim DS bid x 14d, if not improved make appt  w/ MD for excision 2) Breastfeeding  Return for prn.if not improving  Tawnya Crook CNM, [facility] 08/03/17

## 2017-08-08 ENCOUNTER — Telehealth: Payer: Self-pay | Admitting: Internal Medicine

## 2017-08-08 ENCOUNTER — Ambulatory Visit: Payer: 59 | Admitting: Advanced Practice Midwife

## 2017-08-08 ENCOUNTER — Encounter (HOSPITAL_COMMUNITY): Payer: Self-pay | Admitting: *Deleted

## 2017-08-08 ENCOUNTER — Emergency Department (HOSPITAL_COMMUNITY)
Admission: EM | Admit: 2017-08-08 | Discharge: 2017-08-08 | Disposition: A | Discharge status: Home / Self Care | Payer: 59 | Attending: Emergency Medicine | Admitting: Emergency Medicine

## 2017-08-08 DIAGNOSIS — R1031 Right lower quadrant pain: Secondary | ICD-10-CM | POA: Diagnosis not present

## 2017-08-08 DIAGNOSIS — R1013 Epigastric pain: Secondary | ICD-10-CM | POA: Diagnosis not present

## 2017-08-08 DIAGNOSIS — Z79899 Other long term (current) drug therapy: Secondary | ICD-10-CM | POA: Insufficient documentation

## 2017-08-08 DIAGNOSIS — Z87891 Personal history of nicotine dependence: Secondary | ICD-10-CM | POA: Diagnosis not present

## 2017-08-08 LAB — URINALYSIS, ROUTINE W REFLEX MICROSCOPIC
Bilirubin Urine: NEGATIVE
Glucose, UA: NEGATIVE mg/dL
Hgb urine dipstick: NEGATIVE
Ketones, ur: NEGATIVE mg/dL
Leukocytes, UA: NEGATIVE
Nitrite: NEGATIVE
Protein, ur: NEGATIVE mg/dL
Specific Gravity, Urine: 1.009 (ref 1.005–1.030)
pH: 6 (ref 5.0–8.0)

## 2017-08-08 LAB — LIPASE, BLOOD: Lipase: 33 U/L (ref 11–51)

## 2017-08-08 LAB — COMPREHENSIVE METABOLIC PANEL
ALT: 20 U/L (ref 14–54)
AST: 21 U/L (ref 15–41)
Albumin: 4.4 g/dL (ref 3.5–5.0)
Alkaline Phosphatase: 70 U/L (ref 38–126)
Anion gap: 8 (ref 5–15)
BUN: 12 mg/dL (ref 6–20)
CO2: 27 mmol/L (ref 22–32)
Calcium: 9.5 mg/dL (ref 8.9–10.3)
Chloride: 105 mmol/L (ref 101–111)
Creatinine, Ser: 0.73 mg/dL (ref 0.44–1.00)
GFR calc Af Amer: >60 mL/min (ref >60)
GFR calc non Af Amer: >60 mL/min (ref >60)
Glucose, Bld: 74 mg/dL (ref 65–99)
Potassium: 3.6 mmol/L (ref 3.5–5.1)
Sodium: 140 mmol/L (ref 135–145)
Total Bilirubin: 0.4 mg/dL (ref 0.3–1.2)
Total Protein: 7.6 g/dL (ref 6.5–8.1)

## 2017-08-08 LAB — CBC
HCT: 38.5 % (ref 36.0–46.0)
Hemoglobin: 13.2 g/dL (ref 12.0–15.0)
MCH: 29.8 pg (ref 26.0–34.0)
MCHC: 34.3 g/dL (ref 30.0–36.0)
MCV: 86.9 fL (ref 78.0–100.0)
Platelets: 280 10*3/uL (ref 150–400)
RBC: 4.43 MIL/uL (ref 3.87–5.11)
RDW: 12.6 % (ref 11.5–15.5)
WBC: 5.6 10*3/uL (ref 4.0–10.5)

## 2017-08-08 LAB — PREGNANCY, URINE: Preg Test, Ur: NEGATIVE

## 2017-08-08 NOTE — ED Provider Notes (Signed)
[facility] DEPT Provider Note   CSN: [id number] Arrival date & time: 08/08/17  1115     History   Chief Complaint Chief Complaint  Patient presents with  . Abdominal Pain    HPI Cristina Davenport is a 31 y.o. female.  The history is provided by the patient.  Abdominal Pain   This is a new problem. The current episode started more than 2 days ago. The problem occurs constantly. The problem has not changed since onset.The pain is associated with eating. The pain is located in the epigastric region (Aching and then feels like a stabbing when eating.). The quality of the pain is aching and sharp. The pain is moderate. Associated symptoms include diarrhea (yesterday). Pertinent negatives include fever, vomiting and constipation. The symptoms are aggravated by eating. Nothing relieves the symptoms. Past medical history comments: Cholecystectomy.    Past Medical History:  Diagnosis Date  . Anxiety   . Anxiety   . Bipolar 1 disorder ([place])   . GERD (gastroesophageal reflux disease)   . Hypertension    PIH  . IBS (irritable bowel syndrome)   . Migraine without aura     Patient Active Problem List   Diagnosis Date Noted  . Constipation 03/14/2016  . Abdominal bloating 03/14/2016  . Migraine headache 03/12/2016  . Anxiety 04/17/2015  . Low grade squamous intraepithelial lesion on cytologic smear of cervix (LGSIL) 09/15/2014  . Adjustment disorder with anxious mood 09/14/2014  . Abdominal pain, epigastric 04/26/2013    Past Surgical History:  Procedure Laterality Date  . ABDOMINAL SURGERY     cyst removed   . CHOLECYSTECTOMY N/A 07/10/2013   Procedure: LAPAROSCOPIC CHOLECYSTECTOMY;  Surgeon: Donato Heinz, MD;  Location: [facility];  Service: General;  Laterality: N/A;  . ESOPHAGOGASTRODUODENOSCOPY N/A 04/30/2013   KKX:[id number] distal esophagus of uncertain clinical significance-status post biopsy. Tiny hiatal hernia. No explanation  . EXAMINATION UNDER ANESTHESIA  04/21/2012   Gluteal wound repair  . WISDOM TOOTH EXTRACTION      OB History    Gravida Para Term Preterm AB Living   3 2 1 1 1 1    SAB TAB Ectopic Multiple Live Births   1 0 0 0 1       Home Medications    Prior to Admission medications   Medication Sig Start Date End Date Taking? Authorizing Provider  clindamycin (CLEOCIN T) 1 % lotion Apply topically 2 (two) times daily. 05/19/17  Yes Roma Schanz, CNM  escitalopram (LEXAPRO) 20 MG tablet Take 1 tablet (20 mg total) by mouth daily. 05/19/17  Yes Roma Schanz, CNM  Prenatal Vit-Fe Fumarate-FA (PRENATAL MULTIVITAMIN) TABS tablet Take 1 tablet by mouth daily at 12 noon.   Yes [provider]  sulfamethoxazole-trimethoprim (BACTRIM DS,SEPTRA DS) 800-160 MG tablet Take 1 tablet by mouth 2 (two) times daily. X 14 days 08/03/17  Yes Roma Schanz, CNM    Family History Family History  Problem Relation Age of Onset  . Mental illness Mother   . Cancer Father        skin cancer  . Hypertension Maternal Grandmother   . Stroke Maternal Grandmother   . Kidney failure Maternal Grandmother   . Hypertension Maternal Grandfather   . Stroke Maternal Grandfather   . Cancer Maternal Grandfather        skin  . Crohn's disease Maternal Aunt   . Crohn's disease Cousin   . Colon cancer Neg Hx     Social History Social History  Substance Use Topics  . Smoking status: Former Smoker    Packs/day: 0.00    Years: 8.00    Types: Cigarettes    Quit date: 09/19/2014  . Smokeless tobacco: Never Used  . Alcohol use No     Allergies   Latex and Doxycycline   Review of Systems Review of Systems  Constitutional: Negative for fever.  Gastrointestinal: Positive for abdominal pain and diarrhea (yesterday). Negative for constipation and vomiting.  All other systems reviewed and are negative.    Physical Exam Updated Vital Signs There were no vitals taken for this visit.  Physical Exam   ED Treatments / Results  Labs (all  labs ordered are listed, but only abnormal results are displayed) Labs Reviewed  URINALYSIS, ROUTINE W REFLEX MICROSCOPIC - Abnormal; Notable for the following:       Result Value   Color, Urine STRAW (*)    All other components within normal limits  LIPASE, BLOOD  COMPREHENSIVE METABOLIC PANEL  CBC  PREGNANCY, URINE    Procedures Procedures (including critical care time)  Medications Ordered in ED Medications - No data to display   Initial Impression / Assessment and Plan / ED Course  I have reviewed the triage vital signs and the nursing notes.  Pertinent labs & imaging results that were available during my care of the patient were reviewed by me and considered in my medical decision making (see chart for details).  Clinical Course as of Aug 09 1311  Tue Aug 08, 2017  1258 Pt does not want any medications for pain.   She is breast feeding.  [JK]    Clinical Course User Index [JK] Dorie Rank, MD    Patient presents with upper abdominal pain worse after eating.ED workup is reassuring.  No signs to suggest hepatitis, pancreatitis.  She has already had her gallbladder removed.her symptoms may be related to an ulcer. We discussed over-the-counter antacids should be safe for breast-feeding. Follow up with her GI doctor appointment tomorrow as planned.  Final Clinical Impressions(s) / ED Diagnoses   Final diagnoses:  Epigastric pain    New Prescriptions New Prescriptions   No medications on file     Dorie Rank, MD 08/08/17 1314

## 2017-08-08 NOTE — Telephone Encounter (Signed)
[phone] WORK OR [phone] CELL, PLEASE CALL PATIENT, MADE HER AN APPOINTMENT BUT SHE STATED SHE IS HAVING SEVERE ABD PAIN CONSTANTLY AND IT GETS WORSE WHEN SHE EATS. DO I NEED TO USE AN URGENT  PLEASE ADVISE

## 2017-08-08 NOTE — Discharge Instructions (Signed)
Follow up with your GI doctor tomorrow as planned, take tylenol as needed for pain.  You can safely take over the counter certain antacids while breastfeeding such as maalox, tums and pepcid

## 2017-08-08 NOTE — Telephone Encounter (Signed)
Please get in sooner.

## 2017-08-08 NOTE — ED Triage Notes (Addendum)
Pt c/o upper abdominal pain, nausea and vomiting that started Saturday. Denies vomiting.

## 2017-08-09 ENCOUNTER — Encounter: Payer: Self-pay | Admitting: Nurse Practitioner

## 2017-08-09 ENCOUNTER — Ambulatory Visit (INDEPENDENT_AMBULATORY_CARE_PROVIDER_SITE_OTHER): Payer: 59 | Admitting: Nurse Practitioner

## 2017-08-09 VITALS — BP 108/72 | HR 67 | Temp 98.1°F | Ht 61.0 in | Wt 157.8 lb

## 2017-08-09 DIAGNOSIS — R197 Diarrhea, unspecified: Secondary | ICD-10-CM

## 2017-08-09 DIAGNOSIS — R110 Nausea: Secondary | ICD-10-CM | POA: Diagnosis not present

## 2017-08-09 DIAGNOSIS — R1013 Epigastric pain: Secondary | ICD-10-CM

## 2017-08-09 NOTE — Patient Instructions (Signed)
1. Have your breath test completed when you're able to. We will provide you with instructions related to this. 2. The next time you have pain in her stomach try Tums to see if that helps. 3. Go to the pharmacy and asked about a probiotic that you can take while breast-feeding. Take this for 30 days. 4. Return for follow-up in 4 weeks. 5. Call if you have any worsening symptoms or severe symptoms. 6. Call if you have any questions or concerns.

## 2017-08-09 NOTE — Progress Notes (Signed)
Referring Provider: Kathyrn Drown, MD Primary Care Physician:  Mikey Kirschner, MD Primary GI:  Dr. Gala Romney  Chief Complaint  Patient presents with  . Abdominal Pain    started Saturday  . Diarrhea    HPI:   Cristina Davenport is a 31 y.o. female who presents  For abdominal pain and diarrhea. The patient was last seen in our office 0517/01 for constipation and change in bowel habits. She is having persistent bloating and discomfort after bowel cleansing, frustrated. Denies hematochezia or melena. All her symptoms began 2-3 months prior and previously not requiring laxatives. Tried Trulance for 2 weeks, added MiraLAX 3 times daily and Dulcolax and never went to the bathroom. Has baseline irritable bowel syndrome. Noted abdominal pain at that time. Abdominal films showed significant stool burden, CT was assuredly unremarkable. She did eventually have significant results with a bowel prep. He was started on Amitiza 24 g twice a day with samples. Recommended stop Trulance and Levsin. Abdominal x-ray found air with an small bowels more likely related to bowel/colon cleanse procedure and bloating likely due to this air. Recommended colonoscopy and the OR for change in bowel habits. Continue Amitiza 24 g twice a day.  It does not appear colonoscopy ever happened.  Went to ER yesterday and labs were drawn including CBC, CMP, Lipase, Urine preg test; all were normal/negative.  Today she states she never had colonoscopy because she found out she was pregnant. She had a spontaneous abortion. Became pregnant a couple months later and developed same symptoms. She delivered a healthy baby boy who is ow 86 weeks old. Abdominal pain started back 4 days ago, located upper abdomen and radiates down behind her naval; described as ache, constant "does not stop." If she "messes with it" it worsens. Worse with eating. No worsening symptoms at night. A lot of nausea, no vomiting. Denies hematochezia, melena, fever,  chills, unintentional weight loss. Has lost 12 lbs with concerted effort. She is chronically constipated for most of her life. Amitiza previously did not work. However, when her stomach pain started she also developed diarrhea which lasted 2 days and has since resolved. Eating less due to pain. Denies chest pain, dyspnea, dizziness, lightheadedness, syncope, near syncope. Denies any other upper or lower GI symptoms.  Denies GERD-type symptoms.  Has Crohn's disease. Takes Advil rarely. Denies ASA powders.  Past Medical History:  Diagnosis Date  . Anxiety   . Anxiety   . Bipolar 1 disorder ([place])   . GERD (gastroesophageal reflux disease)   . Hypertension    PIH  . IBS (irritable bowel syndrome)   . Migraine without aura     Past Surgical History:  Procedure Laterality Date  . ABDOMINAL SURGERY     cyst removed   . CHOLECYSTECTOMY N/A 07/10/2013   Procedure: LAPAROSCOPIC CHOLECYSTECTOMY;  Surgeon: Donato Heinz, MD;  Location: [facility];  Service: General;  Laterality: N/A;  . ESOPHAGOGASTRODUODENOSCOPY N/A 04/30/2013   GYI:RSWNIOEV distal esophagus of uncertain clinical significance-status post biopsy. Tiny hiatal hernia. No explanation  . EXAMINATION UNDER ANESTHESIA  04/21/2012   Gluteal wound repair  . WISDOM TOOTH EXTRACTION      Current Outpatient Prescriptions  Medication Sig Dispense Refill  . clindamycin (CLEOCIN T) 1 % lotion Apply topically 2 (two) times daily. 60 mL 0  . escitalopram (LEXAPRO) 20 MG tablet Take 1 tablet (20 mg total) by mouth daily. 30 tablet 6  . Prenatal Vit-Fe Fumarate-FA (PRENATAL MULTIVITAMIN) TABS tablet Take  1 tablet by mouth daily at 12 noon.    . sulfamethoxazole-trimethoprim (BACTRIM DS,SEPTRA DS) 800-160 MG tablet Take 1 tablet by mouth 2 (two) times daily. X 14 days (Patient not taking: Reported on 08/09/2017) 28 tablet 0   No current facility-administered medications for this visit.     Allergies as of 08/09/2017 - Review Complete 08/09/2017    Allergen Reaction Noted  . Latex Other (See Comments) 04/21/2012  . Doxycycline Rash 04/29/2013    Family History  Problem Relation Age of Onset  . Mental illness Mother   . Cancer Father        skin cancer  . Hypertension Maternal Grandmother   . Stroke Maternal Grandmother   . Kidney failure Maternal Grandmother   . Hypertension Maternal Grandfather   . Stroke Maternal Grandfather   . Cancer Maternal Grandfather        skin  . Crohn's disease Maternal Aunt   . Crohn's disease Cousin   . Colon cancer Neg Hx     Social History   Social History  . Marital status: Single    Spouse name: N/A  . Number of children: 1  . Years of education: 25   Occupational History  . [employer]    Social History Main Topics  . Smoking status: Former Smoker    Packs/day: 0.00    Years: 8.00    Types: Cigarettes    Quit date: 09/19/2014  . Smokeless tobacco: Never Used  . Alcohol use No  . Drug use: No  . Sexual activity: Yes    Partners: Male    Birth control/ protection: None   Other Topics Concern  . None   Social History Narrative   Lives with 66 yr old daughter and finance   Caffeine use: none    Review of Systems: General: Negative for anorexia, weight loss, fever, chills, fatigue, weakness. ENT: Negative for hoarseness, difficulty swallowing. CV: Negative for chest pain, angina, palpitations, peripheral edema.  Respiratory: Negative for dyspnea at rest, cough, sputum, wheezing.  GI: See history of present illness. Endo: Negative for unusual weight change.  Heme: Negative for bruising or bleeding.   Physical Exam: BP 108/72   Pulse 67   Temp 98.1 F (36.7 C) (Oral)   Ht 5\' 1"  (1.549 m)   Wt 157 lb 12.8 oz (71.6 kg)   BMI 29.82 kg/m  General:   Alert and oriented. Pleasant and cooperative. Well-nourished and well-developed.  Head:  Normocephalic and atraumatic. Eyes:  Without icterus, sclera clear and conjunctiva pink.  Ears:  Normal auditory  acuity. Cardiovascular:  S1, S2 present without murmurs appreciated.Extremities without clubbing or edema. Respiratory:  Clear to auscultation bilaterally. No wheezes, rales, or rhonchi. No distress.  Gastrointestinal:  +BS, soft, and non-distended. No worsening TTP noted. No HSM noted. No guarding or rebound. No masses appreciated.  Rectal:  Deferred  Musculoskalatal:  Symmetrical without gross deformities. Neurologic:  Alert and oriented x4;  grossly normal neurologically. Psych:  Alert and cooperative. Normal mood and affect. Heme/Lymph/Immune: No excessive bruising noted.    08/09/2017 11:56 AM   Disclaimer: This note was dictated with voice recognition software. Similar sounding words can inadvertently be transcribed and may not be corrected upon review.

## 2017-08-16 NOTE — Progress Notes (Signed)
cc'ed to pcp °

## 2017-08-16 NOTE — Assessment & Plan Note (Signed)
Patient describes a lot of nausea but no vomiting. His symptoms and addition to abdominal pain. She does have a history of H. pylori status post treatment. She also developed diarrhea for 2 days when her abdominal pain started. Possibly she has developed worsening gastric symptoms from incomplete eradication of H. pylori versus gastroenteritis versus other etiology. At this point we will treat diarrhea with the probiotic for 30 days and return for follow-up in 4 weeks. Further treatment as per below. Consider CT exam versus upper endoscopy depending symptom progression at her follow-up.

## 2017-08-16 NOTE — Assessment & Plan Note (Signed)
The patient has abdominal pain with new onset diarrhea which is since resolved in addition to nausea. She does have a history of H. pylori. I will attempt to do a hydrogen breath test to check eradication of H. pylori status post treatment. She will need to come off her PPI and she can try Tums over-the-counter as needed. Return for follow-up in 4 weeks. Depending on her symptom progression and results at her follow-up visit we can consider CT exam versus upper endoscopy. Return for follow-up in 4 weeks.

## 2017-09-07 ENCOUNTER — Other Ambulatory Visit: Payer: Self-pay | Admitting: Advanced Practice Midwife

## 2017-09-07 ENCOUNTER — Encounter: Payer: Self-pay | Admitting: Advanced Practice Midwife

## 2017-09-07 MED ORDER — SERTRALINE HCL 50 MG PO TABS: 50.0000 mg | ORAL_TABLET | Freq: Every day | ORAL | 6 refills | Status: DC

## 2017-09-07 NOTE — Progress Notes (Signed)
zoloft 50mg  from lexapro d/t it not working anymore  If this doesn't helf, will see therapist

## 2017-09-08 ENCOUNTER — Encounter: Payer: Self-pay | Admitting: Obstetrics and Gynecology

## 2017-09-12 ENCOUNTER — Ambulatory Visit: Payer: 59 | Admitting: Gastroenterology

## 2017-09-26 ENCOUNTER — Ambulatory Visit: Payer: 59 | Admitting: Nurse Practitioner

## 2017-09-26 ENCOUNTER — Encounter: Payer: Self-pay | Admitting: Advanced Practice Midwife

## 2017-10-10 ENCOUNTER — Encounter: Payer: Self-pay | Admitting: Family Medicine

## 2017-10-10 ENCOUNTER — Ambulatory Visit (INDEPENDENT_AMBULATORY_CARE_PROVIDER_SITE_OTHER): Payer: 59 | Admitting: Family Medicine

## 2017-10-10 VITALS — BP 120/80 | Temp 98.0°F | Wt 161.1 lb

## 2017-10-10 DIAGNOSIS — J310 Chronic rhinitis: Secondary | ICD-10-CM

## 2017-10-10 DIAGNOSIS — J329 Chronic sinusitis, unspecified: Secondary | ICD-10-CM | POA: Diagnosis not present

## 2017-10-10 MED ORDER — CEFDINIR 300 MG PO CAPS: 300.0000 mg | ORAL_CAPSULE | Freq: Two times a day (BID) | ORAL | 0 refills | Status: DC

## 2017-10-10 NOTE — Progress Notes (Signed)
   Subjective:    Patient ID: Cristina Davenport, female    DOB: [birth year], 31 y.o.   MRN: [id number]  Cough  This is a new problem. The current episode started in the past 7 days. Associated symptoms include headaches, rhinorrhea and a sore throat.    Scratchy throat for a week  thanskgibing  Bad cpough got worse  Felt tired and dimished energy and lost voice  Did a lot of talking at wotrk   Dim enrgy   Some   No noticeable fevr  Some cough and cong     Some nausea early pon in the rocess     Patient states no other concerns this visit.  Review of Systems  HENT: Positive for rhinorrhea and sore throat.   Respiratory: Positive for cough.   Neurological: Positive for headaches.       Objective:   Physical Exam  Alert, mild malaise. Hydration good Vitals stable. frontal/ maxillary tenderness evident positive nasal congestion. pharynx normal neck supple  lungs clear/no crackles or wheezes. heart regular in rhythm       Assessment & Plan:  Impression rhinosinusitis/bronchitis likely post viral, discussed with patient. plan antibiotics prescribed. Questions answered. Symptomatic care discussed. warning signs discussed. WSL

## 2017-11-20 ENCOUNTER — Telehealth: Payer: Self-pay | Admitting: *Deleted

## 2017-11-20 ENCOUNTER — Encounter: Payer: Self-pay | Admitting: Family Medicine

## 2017-11-20 NOTE — Telephone Encounter (Signed)
Patient scheduled office visit tomorrow for evaluation.

## 2017-11-20 NOTE — Telephone Encounter (Signed)
With it being after the b m, likely can wait til tomorrow

## 2017-11-20 NOTE — Telephone Encounter (Signed)
-----   Message from Ramah, Generic sent at 11/20/2017 11:16 AM EST -----    good morning,   I am contacting you because I have had 2 bowel movements since last Wednesday and both were followed by a lot of red blood. I though it was just possibly a tear since I had no pain or any other symptoms but there was quite a bit of blood both times. Not sure if I should schedule with Chrys Racer or dr.rourkes office   Called pt she states she had a lot of bright red blood after bowel movement. Happened 2 different times since last Wednesday. Having some dizziness. Should she go to ED or can she have appt tomorrow.

## 2017-11-20 NOTE — Telephone Encounter (Signed)
I called patient to get more infor and turned message into a patient call and routed to dr Richardson Landry. Please see patient call

## 2017-11-21 ENCOUNTER — Encounter: Payer: Self-pay | Admitting: Nurse Practitioner

## 2017-11-21 ENCOUNTER — Ambulatory Visit: Payer: 59 | Admitting: Nurse Practitioner

## 2017-11-21 VITALS — BP 124/82 | Temp 98.4°F | Ht 61.0 in | Wt 160.0 lb

## 2017-11-21 DIAGNOSIS — K921 Melena: Secondary | ICD-10-CM | POA: Diagnosis not present

## 2017-11-21 DIAGNOSIS — D649 Anemia, unspecified: Secondary | ICD-10-CM

## 2017-11-21 DIAGNOSIS — K625 Hemorrhage of anus and rectum: Secondary | ICD-10-CM | POA: Diagnosis not present

## 2017-11-21 LAB — POCT HEMOGLOBIN: Hemoglobin: 11.7 g/dL — AB (ref 12.2–16.2)

## 2017-11-21 NOTE — Patient Instructions (Addendum)
Activia yogurt one cup per day Roseanne Kaufman at [facility]

## 2017-11-21 NOTE — Progress Notes (Signed)
Subjective: Presents for complaints of bright red rectal bleeding that occurred twice last week.  States she felt a popping sensation and noticed a large amount of blood in the toilet.  No rectal itching or pain.  BMs were soft at that time, no straining or constipation.  He had a slight loose bowel movement today, no rectal bleeding noted.  No history of hemorrhoids.  No fevers.  Chronic nausea no vomiting.  No known contacts.  No known exposure to contaminated food or water.  Just completed a normal menstrual cycle a couple of days ago, not currently on birth control.  No intercourse since her last cycle.  Followed by GI specialist last visit 08/09/2017.  Objective:   BP 124/82   Temp 98.4 F (36.9 C) (Oral)   Ht 5\' 1"  (1.549 m)   Wt 160 lb (72.6 kg)   BMI 30.23 kg/m  NAD.  Alert, oriented.  Mildly anxious affect.  Lungs clear.  Heart regular rate and rhythm.  Abdomen mildly obese soft nondistended with active bowel sounds x4; tenderness noted in the left pelvic area towards the left lower quadrant.  No rebound or guarding.  No obvious masses. Results for orders placed or performed in visit on 11/21/17  POCT hemoglobin  Result Value Ref Range   Hemoglobin 11.7 (A) 12.2 - 16.2 g/dL     Assessment:  Rectal bleeding - Plan: POCT hemoglobin  Hematochezia  Anemia, unspecified type     Plan: Recommend that patient have follow-up with her GI specialist for evaluation.  Warning signs reviewed.  Patient to call back to our office or go to ED sooner if any fever vomiting or severe abdominal pain.  Patient plans to make her own appointment.  Call back if we can be of assistance.

## 2018-01-05 ENCOUNTER — Encounter: Payer: Self-pay | Admitting: Nurse Practitioner

## 2018-01-05 ENCOUNTER — Ambulatory Visit: Payer: 59 | Admitting: Nurse Practitioner

## 2018-01-05 VITALS — BP 122/82 | Ht 61.0 in | Wt 156.0 lb

## 2018-01-05 DIAGNOSIS — F419 Anxiety disorder, unspecified: Secondary | ICD-10-CM | POA: Diagnosis not present

## 2018-01-05 DIAGNOSIS — F53 Postpartum depression: Secondary | ICD-10-CM | POA: Insufficient documentation

## 2018-01-05 DIAGNOSIS — O99345 Other mental disorders complicating the puerperium: Secondary | ICD-10-CM

## 2018-01-05 MED ORDER — SERTRALINE HCL 100 MG PO TABS
100.0000 mg | ORAL_TABLET | Freq: Every day | ORAL | 0 refills | Status: DC
Start: 2018-01-05 — End: 2018-05-10

## 2018-01-05 MED ORDER — CLONAZEPAM 0.5 MG PO TABS: ORAL_TABLET | ORAL | 0 refills | Status: DC

## 2018-01-05 NOTE — Patient Instructions (Addendum)
Take 2 Zoloft (100 mg) once a day  Go back to previous dose if any problems.

## 2018-01-06 ENCOUNTER — Encounter: Payer: Self-pay | Admitting: Nurse Practitioner

## 2018-01-06 NOTE — Progress Notes (Signed)
Subjective:  Presents for recheck. Has struggled with anxiety and depression since giving birth about 8 months ago. Was seeing a counselor through [facility] but she has left. Did not seek another one. Feels she does not have time to go to counseling at this time due to work and family. Is currently breastfeeding. GAD 7 : Generalized Anxiety Score 01/06/2018  Nervous, Anxious, on Edge 3  Control/stop worrying 3  Worry too much - different things 3  Trouble relaxing 3  Restless 3  Easily annoyed or irritable 3  Afraid - awful might happen 3  Total GAD 7 Score 21  Anxiety Difficulty Extremely difficult    Denies suicidal or homicidal thoughts or ideation. Rare panic attack. Currently on Zoloft 50 mg which she thinks is not working.   Objective:   BP 122/82   Ht 5\' 1"  (1.549 m)   Wt 156 lb (70.8 kg)   BMI 29.48 kg/m  NAD. Alert, oriented. Thoughts logical, coherent and relevant. Dressed appropriately. Crying at times during office visit. Lungs clear. Heart RRR.   Assessment:   Problem List Items Addressed This Visit      Other   Anxiety - Primary   Relevant Medications   sertraline (ZOLOFT) 100 MG tablet   Post partum depression   Relevant Medications   sertraline (ZOLOFT) 100 MG tablet       Plan:   Meds ordered this encounter  Medications  . sertraline (ZOLOFT) 100 MG tablet    Sig: Take 1 tablet (100 mg total) by mouth daily.    Dispense:  90 tablet    Refill:  0    Order Specific Question:   Supervising Provider    Answer:   Mikey Kirschner [[id number]]  . clonazePAM (KLONOPIN) 0.5 MG tablet    Sig: Take 1/2 - 1 tab daily prn extreme anxiety; avoid taking around time of breastfeeding    Dispense:  30 tablet    Refill:  0    Order Specific Question:   Supervising Provider    Answer:   Mikey Kirschner [[id number]]   Avoid using Klonopin around the time of breastfeeding. Reviewed risks including drowsiness in infant. Defers counseling at this time.  Return in about 1 month  (around 02/02/2018) for recheck. Call back sooner if any problems.

## 2018-01-16 ENCOUNTER — Encounter: Payer: Self-pay | Admitting: Nurse Practitioner

## 2018-01-22 ENCOUNTER — Telehealth: Payer: Self-pay | Admitting: Family Medicine

## 2018-01-22 ENCOUNTER — Other Ambulatory Visit: Payer: Self-pay | Admitting: Nurse Practitioner

## 2018-01-22 DIAGNOSIS — Z01419 Encounter for gynecological examination (general) (routine) without abnormal findings: Secondary | ICD-10-CM

## 2018-01-22 DIAGNOSIS — D649 Anemia, unspecified: Secondary | ICD-10-CM

## 2018-01-22 DIAGNOSIS — E559 Vitamin D deficiency, unspecified: Secondary | ICD-10-CM

## 2018-01-22 NOTE — Telephone Encounter (Signed)
CBC with diff (anemia); met 7, lipid, liver, TSH and vitamin D 

## 2018-01-22 NOTE — Telephone Encounter (Signed)
Patient has wellness in April and needing labs done.

## 2018-01-22 NOTE — Telephone Encounter (Signed)
Labs placed in Epic; informed pt.

## 2018-02-02 ENCOUNTER — Ambulatory Visit (INDEPENDENT_AMBULATORY_CARE_PROVIDER_SITE_OTHER): Payer: 59 | Admitting: Nurse Practitioner

## 2018-02-02 ENCOUNTER — Encounter: Payer: Self-pay | Admitting: Nurse Practitioner

## 2018-02-02 VITALS — BP 116/74 | Ht 61.0 in | Wt 154.0 lb

## 2018-02-02 DIAGNOSIS — F419 Anxiety disorder, unspecified: Secondary | ICD-10-CM | POA: Diagnosis not present

## 2018-02-02 DIAGNOSIS — B379 Candidiasis, unspecified: Secondary | ICD-10-CM | POA: Diagnosis not present

## 2018-02-02 MED ORDER — FLUCONAZOLE 150 MG PO TABS: ORAL_TABLET | ORAL | 0 refills | Status: DC

## 2018-02-02 NOTE — Patient Instructions (Signed)
Acidophilus

## 2018-02-03 ENCOUNTER — Encounter: Payer: Self-pay | Admitting: Nurse Practitioner

## 2018-02-03 NOTE — Progress Notes (Signed)
Subjective: Presents for recheck on her anxiety.  Doing much better on Zoloft.  Less emotional lability.  Has been trying to work out on weekends when she has the time.  Denies any adverse effects.  Less moodiness.  Still experiencing some situational stress related to her husband working long hours out of town.  He is going back to school to try to change positions.  Takes occasional Klonopin for extreme anxiety, does not breast-feed within 4 hours of taking medication.  Objective:   BP 116/74   Ht 5\' 1"  (1.549 m)   Wt 154 lb (69.9 kg)   BMI 29.10 kg/m  NAD.  Alert, oriented.  Much calmer affect from previous visit.  Thoughts logical coherent and relevant.  Dressed appropriately.  Making good eye contact.  Lungs clear.  Heart regular rate rhythm.  Assessment:   Problem List Items Addressed This Visit      Other   Anxiety - Primary       Plan:   Meds ordered this encounter  Medications  . fluconazole (DIFLUCAN) 150 MG tablet    Sig: One po qd prn yeast infection; may repeat in 3-4 days if needed    Dispense:  2 tablet    Refill:  0    Order Specific Question:   Supervising Provider    Answer:   Mikey Kirschner [[id number]]   Continue current medications as directed.  Reminded patient that Klonopin should not be taken if she becomes pregnant.  Also cautioned with breast-feeding.  At the end of visit patient requested Diflucan for possible yeast infection.  Recheck here or with gynecology if symptoms persist.  Encouraged regular exercise. Return in about 3 months (around 05/05/2018) for recheck.

## 2018-02-05 ENCOUNTER — Ambulatory Visit (INDEPENDENT_AMBULATORY_CARE_PROVIDER_SITE_OTHER): Payer: 59 | Admitting: Family Medicine

## 2018-02-05 ENCOUNTER — Encounter: Payer: Self-pay | Admitting: Family Medicine

## 2018-02-05 VITALS — Temp 99.1°F

## 2018-02-05 DIAGNOSIS — R509 Fever, unspecified: Secondary | ICD-10-CM | POA: Diagnosis not present

## 2018-02-05 LAB — POCT RAPID STREP A (OFFICE): Rapid Strep A Screen: NEGATIVE

## 2018-02-05 LAB — POCT INFLUENZA A/B
Influenza A, POC: NEGATIVE
Influenza B, POC: NEGATIVE

## 2018-02-05 NOTE — Progress Notes (Signed)
Cristina Davenport was seen today for testing.  She developed a low-grade fever here at work, and some body aches and sore throat.  Rapid influenza test negative.  Rapid strep test negative.  Recommend acetaminophen, fluids, supportive care.

## 2018-02-23 DIAGNOSIS — Z01419 Encounter for gynecological examination (general) (routine) without abnormal findings: Secondary | ICD-10-CM | POA: Diagnosis not present

## 2018-02-23 DIAGNOSIS — E559 Vitamin D deficiency, unspecified: Secondary | ICD-10-CM | POA: Diagnosis not present

## 2018-02-23 DIAGNOSIS — D649 Anemia, unspecified: Secondary | ICD-10-CM | POA: Diagnosis not present

## 2018-02-24 LAB — BASIC METABOLIC PANEL
BUN/Creatinine Ratio: 21 (ref 9–23)
BUN: 13 mg/dL (ref 6–20)
CO2: 23 mmol/L (ref 20–29)
Calcium: 9 mg/dL (ref 8.7–10.2)
Chloride: 106 mmol/L (ref 96–106)
Creatinine, Ser: 0.63 mg/dL (ref 0.57–1.00)
GFR calc Af Amer: 140 mL/min/{1.73_m2} (ref >59)
GFR calc non Af Amer: 122 mL/min/{1.73_m2} (ref >59)
Glucose: 76 mg/dL (ref 65–99)
Potassium: 4.4 mmol/L (ref 3.5–5.2)
Sodium: 143 mmol/L (ref 134–144)

## 2018-02-24 LAB — CBC WITH DIFFERENTIAL/PLATELET
Basophils Absolute: 0 10*3/uL (ref 0.0–0.2)
Basos: 1 %
EOS (ABSOLUTE): 0.2 10*3/uL (ref 0.0–0.4)
Eos: 2 %
Hematocrit: 41.1 % (ref 34.0–46.6)
Hemoglobin: 14 g/dL (ref 11.1–15.9)
Immature Grans (Abs): 0 10*3/uL (ref 0.0–0.1)
Immature Granulocytes: 0 %
Lymphocytes Absolute: 2.2 10*3/uL (ref 0.7–3.1)
Lymphs: 27 %
MCH: 30.1 pg (ref 26.6–33.0)
MCHC: 34.1 g/dL (ref 31.5–35.7)
MCV: 88 fL (ref 79–97)
Monocytes Absolute: 0.7 10*3/uL (ref 0.1–0.9)
Monocytes: 9 %
Neutrophils Absolute: 4.8 10*3/uL (ref 1.4–7.0)
Neutrophils: 61 %
Platelets: 262 10*3/uL (ref 150–379)
RBC: 4.65 x10E6/uL (ref 3.77–5.28)
RDW: 13.1 % (ref 12.3–15.4)
WBC: 7.8 10*3/uL (ref 3.4–10.8)

## 2018-02-24 LAB — HEPATIC FUNCTION PANEL
ALT: 14 IU/L (ref 0–32)
AST: 14 IU/L (ref 0–40)
Albumin: 4.6 g/dL (ref 3.5–5.5)
Alkaline Phosphatase: 70 IU/L (ref 39–117)
Bilirubin Total: 0.3 mg/dL (ref 0.0–1.2)
Bilirubin, Direct: 0.09 mg/dL (ref 0.00–0.40)
Total Protein: 7 g/dL (ref 6.0–8.5)

## 2018-02-24 LAB — LIPID PANEL
Chol/HDL Ratio: 2.5 ratio (ref 0.0–4.4)
Cholesterol, Total: 189 mg/dL (ref 100–199)
HDL: 76 mg/dL (ref >39)
LDL Calculated: 104 mg/dL — ABNORMAL HIGH (ref 0–99)
Triglycerides: 46 mg/dL (ref 0–149)
VLDL Cholesterol Cal: 9 mg/dL (ref 5–40)

## 2018-02-24 LAB — TSH: TSH: 2.19 u[IU]/mL (ref 0.450–4.500)

## 2018-02-24 LAB — VITAMIN D 25 HYDROXY (VIT D DEFICIENCY, FRACTURES): Vit D, 25-Hydroxy: 68.5 ng/mL (ref 30.0–100.0)

## 2018-02-28 ENCOUNTER — Other Ambulatory Visit: Payer: Self-pay | Admitting: Nurse Practitioner

## 2018-02-28 ENCOUNTER — Ambulatory Visit (INDEPENDENT_AMBULATORY_CARE_PROVIDER_SITE_OTHER): Payer: 59 | Admitting: Nurse Practitioner

## 2018-02-28 ENCOUNTER — Encounter: Payer: Self-pay | Admitting: Nurse Practitioner

## 2018-02-28 VITALS — BP 122/80 | Ht 61.0 in | Wt 150.4 lb

## 2018-02-28 DIAGNOSIS — Z0000 Encounter for general adult medical examination without abnormal findings: Secondary | ICD-10-CM | POA: Diagnosis not present

## 2018-02-28 DIAGNOSIS — M62838 Other muscle spasm: Secondary | ICD-10-CM

## 2018-02-28 NOTE — Progress Notes (Signed)
Subjective:    Patient ID: Cristina Davenport, female    DOB: 1986/04/25, 32 y.o.   MRN: [id number]  CC: wellness check  HPI: patient presents for a wellness check.  She has been experiencing some stiffness in her neck since Monday.  She thinks she strained the muscle while exercising.  It hurts in her neck and shoulder area on her right side.  There is no numbness or tingling associated with the pain.  It hurts more when she looks up, down, or turns her head side to side.  She also cannot lay down flat.  At her job she holds the phone between her right ear and shoulder, and that hurts as well.  She has not tried anything to treat the pain.  She states the pain is gradually getting better.  Currently breast feeding but trying to wean.  Past Medical History:  Diagnosis Date  . Anxiety   . Anxiety   . Bipolar 1 disorder ([place])   . GERD (gastroesophageal reflux disease)   . Hypertension    PIH  . IBS (irritable bowel syndrome)   . Migraine without aura    Past Surgical History:  Procedure Laterality Date  . ABDOMINAL SURGERY     cyst removed   . CHOLECYSTECTOMY N/A 07/10/2013   Procedure: LAPAROSCOPIC CHOLECYSTECTOMY;  Surgeon: Donato Heinz, MD;  Location: [facility];  Service: General;  Laterality: N/A;  . ESOPHAGOGASTRODUODENOSCOPY N/A 04/30/2013   [id number] distal esophagus of uncertain clinical significance-status post biopsy. Tiny hiatal hernia. No explanation  . EXAMINATION UNDER ANESTHESIA  04/21/2012   Gluteal wound repair  . WISDOM TOOTH EXTRACTION     No hospitalizations, accidents, or injuries in the past three months.  Allergies  Allergen Reactions  . Latex Other (See Comments)    General irritation  . Doxycycline Rash    Solar rash   Current Outpatient Medications on File Prior to Visit  Medication Sig Dispense Refill  . clindamycin (CLEOCIN T) 1 % lotion Apply topically 2 (two) times daily. 60 mL 0  . fluconazole (DIFLUCAN) 150 MG tablet One po qd prn yeast infection;  may repeat in 3-4 days if needed (Patient not taking: Reported on 02/28/2018) 2 tablet 0  . Prenatal Vit-Fe Fumarate-FA (PRENATAL MULTIVITAMIN) TABS tablet Take 1 tablet by mouth daily at 12 noon.    . sertraline (ZOLOFT) 100 MG tablet Take 1 tablet (100 mg total) by mouth daily. 90 tablet 0   No current facility-administered medications on file prior to visit.    Family History  Problem Relation Age of Onset  . Mental illness Mother   . Cancer Father        skin cancer  . Hypertension Maternal Grandmother   . Stroke Maternal Grandmother   . Kidney failure Maternal Grandmother   . Hypertension Maternal Grandfather   . Stroke Maternal Grandfather   . Cancer Maternal Grandfather        skin  . Crohn's disease Maternal Aunt   . Crohn's disease Cousin   . Colon cancer Neg Hx    Immunization History  Administered Date(s) Administered  . DTaP 07/22/1988, 11/11/1988, 01/18/1989, 02/06/1990, 06/01/1992  . HPV 9-valent 10/03/2014, 12/03/2014, 04/03/2015  . Hepatitis B 01/19/1995, 03/31/1995, 06/30/1995  . HiB (PRP-OMP) 08/18/1989  . IPV 07/22/1988, 11/11/1988, 02/06/1990, 05/20/1993  . MMR 02/06/1990, 12/21/1992  . Meningococcal Conjugate 08/02/2006  . Td 04/14/2004  . 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  Tries  to eat a healthy diet.  Exercises 3-4 times a week; she does one mile on the elliptical and weight lifting.  She quit smoking four years ago and does not use any smokeless tobacco.  She does not drink any alcohol.  She has a hard time sleeping because she can get very anxious.  She drinks one cup of coffee every morning.  She currently has one female sexual partner.  LMP: last months; periods are regular; start heavy and end light.  She has bad cramping on the first few days.  Review of Systems  Constitutional: Negative for activity change, appetite change, fever and unexpected weight change.  HENT: Negative for congestion, postnasal drip,  rhinorrhea, sinus pressure and sneezing.   Eyes: Negative for visual disturbance.  Respiratory: Negative for cough, chest tightness, shortness of breath and wheezing.   Cardiovascular: Negative for chest pain and leg swelling.  Gastrointestinal: Negative for abdominal pain, constipation, diarrhea, nausea and vomiting.  Genitourinary: Negative for difficulty urinating, dysuria, frequency, genital sores, pelvic pain, urgency, vaginal discharge and vaginal pain.  Musculoskeletal: Positive for myalgias and neck pain.       See HPI  Neurological: Negative for syncope, weakness and numbness.  Psychiatric/Behavioral: Positive for decreased concentration and sleep disturbance. The patient is nervous/anxious.        States that she has had trouble concentrating her entire life.  States that she cannot multitask and is forgetful at times.   Depression screen [facility] 2/9 02/28/2018 02/01/2017  Decreased Interest 1 1  Down, Depressed, Hopeless 0 2  PHQ - 2 Score 1 3  Altered sleeping 3 3  Tired, decreased energy 1 3  Change in appetite 0 1  Feeling bad or failure about yourself  0 1  Trouble concentrating 3 2  Moving slowly or fidgety/restless 3 0  Suicidal thoughts 0 0  PHQ-9 Score 11 13      Objective:   Physical Exam  Constitutional: Vital signs are normal. No distress.  HENT:  Right Ear: Hearing and tympanic membrane normal.  Left Ear: Hearing and tympanic membrane normal.  Mouth/Throat: Oropharynx is clear and moist and mucous membranes are normal.  Neck: Normal range of motion. Neck supple. No thyroid mass and no thyromegaly present.  Mild neck pain on right side with flexion, extension, left rotation, and left lateral flexion.  No stiffness.  Cardiovascular: Normal rate, regular rhythm and normal heart sounds.  Pulmonary/Chest: Effort normal and breath sounds normal.  Musculoskeletal:  Right trapezius muscle stiffness from base of skull to edge of scapula.  No stiffness on left side.    Lymphadenopathy:    She has no cervical adenopathy.  Psychiatric: She has a normal mood and affect. Her speech is normal and behavior is normal. Thought content normal.   Blood pressure 122/80, height '5\' 1"'$  (1.549 m), weight 150 lb 6.4 oz (68.2 kg), currently breastfeeding. Body mass index is 28.42 kg/m.     Assessment & Plan:   Problem List Items Addressed This Visit    None    Visit Diagnoses    Muscle spasms of neck    -  Primary     Teaching: recommend treat muscle pain with an antiinflammatory medication such as ibuprofen.  Heat/ice and stretching exercises should help relieve the pain as well.  Reviewed age appropriate health maintenance topics.  Encouraged continued healthy eating and exercise.  Call the office if symptoms worsen or fail to improve.  Return in about 6 months (around 08/30/2018) for anxiety.

## 2018-03-01 ENCOUNTER — Encounter: Payer: Self-pay | Admitting: Nurse Practitioner

## 2018-04-12 ENCOUNTER — Encounter: Payer: Self-pay | Admitting: Nurse Practitioner

## 2018-04-12 ENCOUNTER — Other Ambulatory Visit: Payer: Self-pay | Admitting: Nurse Practitioner

## 2018-04-12 MED ORDER — CLONAZEPAM 0.5 MG PO TABS: ORAL_TABLET | ORAL | 0 refills | Status: DC

## 2018-05-02 ENCOUNTER — Encounter: Payer: Self-pay | Admitting: Nurse Practitioner

## 2018-05-04 ENCOUNTER — Ambulatory Visit: Payer: 59 | Admitting: Nurse Practitioner

## 2018-05-07 ENCOUNTER — Ambulatory Visit: Payer: 59 | Admitting: Nurse Practitioner

## 2018-05-10 ENCOUNTER — Other Ambulatory Visit: Payer: Self-pay | Admitting: Nurse Practitioner

## 2018-05-11 ENCOUNTER — Other Ambulatory Visit: Payer: Self-pay | Admitting: Family Medicine

## 2018-05-11 ENCOUNTER — Other Ambulatory Visit: Payer: Self-pay | Admitting: Nurse Practitioner

## 2018-05-11 MED ORDER — CLONAZEPAM 0.5 MG PO TABS: ORAL_TABLET | ORAL | 2 refills | Status: DC

## 2018-05-21 ENCOUNTER — Ambulatory Visit: Payer: 59 | Admitting: Nurse Practitioner

## 2018-05-21 ENCOUNTER — Encounter: Payer: Self-pay | Admitting: Nurse Practitioner

## 2018-05-21 VITALS — BP 128/78 | Ht 61.0 in | Wt 147.8 lb

## 2018-05-21 DIAGNOSIS — F419 Anxiety disorder, unspecified: Secondary | ICD-10-CM

## 2018-05-21 MED ORDER — CLONAZEPAM 1 MG PO TABS: ORAL_TABLET | ORAL | 2 refills | Status: DC

## 2018-05-21 NOTE — Patient Instructions (Signed)
Increase Zoloft to 1 1/2 tablets per day (150 mg)

## 2018-05-21 NOTE — Progress Notes (Signed)
Subjective: Presents for recheck on her anxiety.  Klonopin helps some, has taken up to twice a day if needed, tries to avoid taking it if possible.  This does help her panic attacks and anxiety.  Zoloft does not seem to be working as well as it did before.  Patient is the mother of 2 young children, the youngest one is about a-year-old.  Has been having some relationship issues with her female partner.  Her oldest child is now living with her father so that she can attend local school during the week, visits her mother on the weekends.  The biological father takes good care of her and they are doing a good job coparenting.  Although this scheduling works best for everyone, the patient is experiencing significant anxiety and guilt regarding the situation.  At one time was working out on a regular basis, since she is moved out of town has decreased her exercise regimen.  Also plans to go back to school soon.  Is no longer breastfeeding.  GAD 7 : Generalized Anxiety Score 05/21/2018 01/06/2018  Nervous, Anxious, on Edge 2 3  Control/stop worrying 3 3  Worry too much - different things 3 3  Trouble relaxing 3 3  Restless 3 3  Easily annoyed or irritable 3 3  Afraid - awful might happen 3 3  Total GAD 7 Score 20 21  Anxiety Difficulty Extremely difficult Extremely difficult      Objective:   BP 128/78   Ht 5\' 1"  (1.549 m)   Wt 147 lb 12.8 oz (67 kg)   BMI 27.93 kg/m  NAD.  Alert, oriented.  Crying at times during office visit.  Thoughts logical coherent and relevant.  Dressed appropriately.  Making good eye contact.  Assessment:   Problem List Items Addressed This Visit      Other   Anxiety - Primary        Plan: There is minimal improvement in her GAD scores from previous visit.  Explained that there are several factors contributing to her increased anxiety including postpartum issues, mother of 2 young children, working, going back to school, relationship issues with her partner, dealing  with guilt and anxiety regarding her daughter living with her biological father during the week.  To make matters worse, patient is no longer doing her exercise regimen which is only increasing her anxiety level.  Our plan is as follows: 1.  Increase Zoloft to 1-1/2 tabs per day (150 mg).  Go back to previous dose and contact office if any problems. 2.  Increase Klonopin dose; may take half tab in the morning, half tab in the afternoon and 1 whole tab at bedtime as needed.  Do not use more than 2 tablets/day. Meds ordered this encounter  Medications  . clonazePAM (KLONOPIN) 1 MG tablet    Sig: Take 1/2 tab in the am; 1/2 tab in the afternoon; one tab at bedtime as needed for anxiety    Dispense:  60 tablet    Refill:  2    May refill monthly    Order Specific Question:   Supervising Provider    Answer:   Mikey Kirschner [[id number]]    3.  Strongly encourage patient to contact a counselor on her preferred provider list for mental health counseling. Patient agrees with this plan.  If no improvement in symptoms or if she becomes worse, to contact the office. Return in about 3 months (around 08/21/2018) for recheck. 25 minutes was spent with the  patient.  This statement verifies that 25 minutes was indeed spent with the patient.  More than 50% of this visit-total duration of the visit-was spent in counseling and coordination of care. The issues that the patient came in for today as reflected in the diagnosis (s) please refer to documentation for further details.

## 2018-07-17 ENCOUNTER — Encounter: Payer: Self-pay | Admitting: Family Medicine

## 2018-07-18 ENCOUNTER — Other Ambulatory Visit: Payer: Self-pay | Admitting: *Deleted

## 2018-07-18 MED ORDER — VALACYCLOVIR HCL 1 G PO TABS: ORAL_TABLET | ORAL | 6 refills | Status: DC

## 2018-07-18 MED ORDER — SERTRALINE HCL 100 MG PO TABS: ORAL_TABLET | ORAL | 0 refills | Status: DC

## 2018-07-24 ENCOUNTER — Telehealth: Payer: 59 | Admitting: Family

## 2018-07-24 ENCOUNTER — Encounter: Payer: Self-pay | Admitting: Family Medicine

## 2018-07-24 DIAGNOSIS — B3731 Acute candidiasis of vulva and vagina: Secondary | ICD-10-CM

## 2018-07-24 DIAGNOSIS — B373 Candidiasis of vulva and vagina: Secondary | ICD-10-CM | POA: Diagnosis not present

## 2018-07-24 MED ORDER — FLUCONAZOLE 150 MG PO TABS: 150.0000 mg | ORAL_TABLET | Freq: Once | ORAL | 0 refills | Status: AC

## 2018-07-24 NOTE — Progress Notes (Signed)

## 2018-07-27 ENCOUNTER — Telehealth: Payer: 59 | Admitting: Family Medicine

## 2018-07-27 DIAGNOSIS — R05 Cough: Secondary | ICD-10-CM | POA: Diagnosis not present

## 2018-07-27 DIAGNOSIS — J019 Acute sinusitis, unspecified: Secondary | ICD-10-CM | POA: Diagnosis not present

## 2018-07-27 DIAGNOSIS — R059 Cough, unspecified: Secondary | ICD-10-CM

## 2018-07-27 MED ORDER — AMOXICILLIN-POT CLAVULANATE 875-125 MG PO TABS: 1.0000 | ORAL_TABLET | Freq: Two times a day (BID) | ORAL | 0 refills | Status: AC

## 2018-07-27 NOTE — Progress Notes (Signed)

## 2018-08-17 ENCOUNTER — Encounter: Payer: Self-pay | Admitting: Family Medicine

## 2018-08-17 ENCOUNTER — Ambulatory Visit (INDEPENDENT_AMBULATORY_CARE_PROVIDER_SITE_OTHER): Payer: 59 | Admitting: Family Medicine

## 2018-08-17 ENCOUNTER — Ambulatory Visit: Payer: 59 | Admitting: Family Medicine

## 2018-08-17 VITALS — BP 110/70 | Ht 61.0 in | Wt 138.1 lb

## 2018-08-17 DIAGNOSIS — O99345 Other mental disorders complicating the puerperium: Secondary | ICD-10-CM | POA: Diagnosis not present

## 2018-08-17 DIAGNOSIS — F419 Anxiety disorder, unspecified: Secondary | ICD-10-CM | POA: Diagnosis not present

## 2018-08-17 DIAGNOSIS — F53 Postpartum depression: Secondary | ICD-10-CM

## 2018-08-17 MED ORDER — CLONAZEPAM 1 MG PO TABS: ORAL_TABLET | ORAL | 0 refills | Status: DC

## 2018-08-17 MED ORDER — SERTRALINE HCL 100 MG PO TABS: ORAL_TABLET | ORAL | 5 refills | Status: DC

## 2018-08-17 NOTE — Progress Notes (Signed)
   Subjective:    Patient ID: Cristina Davenport, female    DOB: [birth year], 32 y.o.   MRN: [id number]  HPI  Patient is here today to follow up on her anxiety.She states the clonazepam and the zoloft are working well for her.   Overall stress getting better  Tolerating medications well.  No obvious side effects.  Less stress at this time.  Still some trouble sleeping at night though overall improved.  Compliant with medications.  Down with her clonazepam usage to approximately 45 tablets/month    Review of Systems No headache, no major weight loss or weight gain, no chest pain no back pain abdominal pain no change in bowel habits complete ROS otherwise negative     Objective:   Physical Exam  Alert vitals stable, NAD. Blood pressure good on repeat. HEENT normal. Lungs clear. Heart regular rate and rhythm.      Assessment & Plan:  Impression generalized anxiety disorder with element of insomnia and fatigue plan maintain meds.  Diet discussed.  Appropriate use of medicines discussed exercise discussed

## 2018-09-08 ENCOUNTER — Encounter: Payer: Self-pay | Admitting: Family Medicine

## 2018-09-11 MED ORDER — VALACYCLOVIR HCL 1 G PO TABS: ORAL_TABLET | ORAL | 6 refills | Status: DC

## 2018-11-14 NOTE — L&D Delivery Note (Addendum)
OB/[facility]N Faculty Practice Delivery Note  Cristina Davenport is a 33 y.o. [id number] s/p NSVD at [redacted]w[redacted]d. She was admitted for spontaneous labor.   ROM: rupture date, rupture time, delivery date, or delivery time have not been documented with clear fluid GBS Status:  --Henderson Cloud (12/07 0000) Maximum Maternal Temperature: 98.7 F   Labor Progress: . Patient arrived at 3 cm dilation and labored spontaneously. She reached full dilation but was at high station so was given terbutaline, and then after a period of recovery resumed pushing with NSVD as below.   Delivery Date/Time: 11/07/2019 at 1009 Delivery: Called to room and patient was complete and pushing. While crowning there was noted to be a gush of blood. Head delivered in OP position. No nuchal cord present. Shoulder and body delivered in usual fashion. Infant with spontaneous cry, placed on mother's abdomen, dried and stimulated. Significant edema of forehead likely secondary to OP position. Cord clamped x 2 after 1-minute delay, and cut by father. Cord blood drawn. Placenta delivered spontaneously with gentle cord traction. Inspection of the placenta revealed a 4-5 cm clot at the placental edge suspicious for abruption. Fundus firm with massage and Pitocin. Labia, perineum, vagina, and cervix inspected with minor L labial laceration that was repaired for hemostasis.   Placenta: 3v, intact, to pathology Complications: suspected abruption Lacerations: L labial, repaired with 4-0 monocryl EBL: 200 cc Analgesia: epidural   Infant: APGAR (1 MIN): 6   APGAR (5 MINS): 8    Weight: 2400 grams  Augustin Coupe, MD/[facility] OB/GYN Fellow, Faculty Practice

## 2018-12-05 ENCOUNTER — Other Ambulatory Visit: Payer: Self-pay | Admitting: Family Medicine

## 2019-02-07 ENCOUNTER — Encounter: Payer: Self-pay | Admitting: Family Medicine

## 2019-02-08 ENCOUNTER — Telehealth: Payer: Self-pay

## 2019-02-08 MED ORDER — PHENTERMINE HCL 37.5 MG PO TABS: 37.5000 mg | ORAL_TABLET | Freq: Every day | ORAL | 2 refills | Status: DC

## 2019-02-08 NOTE — Telephone Encounter (Signed)
Left a vm that the medication, and sent via my chart.

## 2019-02-08 NOTE — Telephone Encounter (Signed)
done

## 2019-02-08 NOTE — Telephone Encounter (Signed)
Patient is aware of all and aware we have pended it and will await Dr. Serena Croissant.

## 2019-02-08 NOTE — Telephone Encounter (Signed)
Please send in the pending phentermine.

## 2019-02-26 ENCOUNTER — Other Ambulatory Visit: Payer: Self-pay

## 2019-02-26 ENCOUNTER — Ambulatory Visit (INDEPENDENT_AMBULATORY_CARE_PROVIDER_SITE_OTHER): Payer: Self-pay | Admitting: Family Medicine

## 2019-02-26 DIAGNOSIS — O99345 Other mental disorders complicating the puerperium: Secondary | ICD-10-CM

## 2019-02-26 DIAGNOSIS — F53 Postpartum depression: Secondary | ICD-10-CM

## 2019-02-26 MED ORDER — SERTRALINE HCL 100 MG PO TABS: ORAL_TABLET | ORAL | 1 refills | Status: DC

## 2019-02-26 NOTE — Progress Notes (Signed)
   Subjective:    Patient ID: Cristina Davenport, female    DOB: Aug 25, 1986, 33 y.o.   MRN: [id number] Audio plus visual HPI  Patient calls for a follow up on anxiety and depression. Patient states she is doing well on current medications.  Virtual Visit via Video Note  I connected with Gary on 02/26/19 at  2:00 PM EDT by a video enabled telemedicine application and verified that I am speaking with the correct person using two identifiers.   I discussed the limitations of evaluation and management by telemedicine and the availability of in person appointments. The patient expressed understanding and agreed to proceed.  History of Present Illness:    Observations/Objective:   Assessment and Plan:   Follow Up Instructions:    I discussed the assessment and treatment plan with the patient. The patient was provided an opportunity to ask questions and all were answered. The patient agreed with the plan and demonstrated an understanding of the instructions.   The patient was advised to call back or seek an in-person evaluation if the symptoms worsen or if the condition fails to improve as anticipated.  I provided 15 minutes of non-face-to-face time during this encounter.   Patient having ongoing challenges with anxiety and depression.  Compliant with medications.  Has backed off on the clonazepam.  No obvious side effects with Zoloft.  Trying to exercise more.  Homebound now with children.  Overall anxious but doing well.    Review of Systems No headache, no major weight loss or weight gain, no chest pain no back pain abdominal pain no change in bowel habits complete ROS otherwise negative     Objective:   Physical Exam  Virtual visit      Assessment & Plan:  Impression chronic anxiety/depression clinically stable diet discussed.  Compliance discussed.  Exercise discussed.  Stress reduction discussed follow-up in 6 months

## 2019-03-12 ENCOUNTER — Other Ambulatory Visit: Payer: Self-pay | Admitting: Family Medicine

## 2019-03-13 MED ORDER — CLONAZEPAM 1 MG PO TABS: ORAL_TABLET | ORAL | 2 refills | Status: DC

## 2019-03-13 NOTE — Telephone Encounter (Signed)
Ok plus 2 monthly ref 

## 2019-03-18 ENCOUNTER — Encounter: Payer: Self-pay | Admitting: *Deleted

## 2019-03-19 ENCOUNTER — Encounter: Payer: Self-pay | Admitting: Adult Health

## 2019-03-19 ENCOUNTER — Ambulatory Visit (INDEPENDENT_AMBULATORY_CARE_PROVIDER_SITE_OTHER): Payer: Medicaid Other | Admitting: Adult Health

## 2019-03-19 ENCOUNTER — Other Ambulatory Visit: Payer: Self-pay

## 2019-03-19 DIAGNOSIS — O3680X0 Pregnancy with inconclusive fetal viability, not applicable or unspecified: Secondary | ICD-10-CM | POA: Diagnosis not present

## 2019-03-19 DIAGNOSIS — Z3201 Encounter for pregnancy test, result positive: Secondary | ICD-10-CM

## 2019-03-19 DIAGNOSIS — Z3A01 Less than 8 weeks gestation of pregnancy: Secondary | ICD-10-CM

## 2019-03-19 NOTE — Progress Notes (Signed)
Patient ID: HIBO BLASDELL, female   DOB: Apr 12, 1986, 33 y.o.   MRN: [id number]   TELEHEALTH VIRTUAL GYNECOLOGY VISIT ENCOUNTER NOTE  I connected with Takenya B Hopson on 03/19/19 at  8:45 AM EDT by telephone at home and verified that I am speaking with the correct person using two identifiers.   I discussed the limitations, risks, security and privacy concerns of performing an evaluation and management service by telephone and the availability of in person appointments. I also discussed with the patient that there may be a patient responsible charge related to this service. The patient expressed understanding and agreed to proceed.   History:  NARIYA NEUMEYER is a 33 y.o. [phone] female being evaluated today for having missed a period and had +HPTs.She has had nausea and vomiting esp at night. She denies any abnormal vaginal discharge, bleeding, pelvic pain or other concerns.    PCP is Dr Mickie Hillier.    Past Medical History:  Diagnosis Date  . Anxiety   . Anxiety   . Bipolar 1 disorder ([place])   . GERD (gastroesophageal reflux disease)   . Hypertension    PIH  . IBS (irritable bowel syndrome)   . Migraine without aura    Past Surgical History:  Procedure Laterality Date  . ABDOMINAL SURGERY     cyst removed   . CHOLECYSTECTOMY N/A 07/10/2013   Procedure: LAPAROSCOPIC CHOLECYSTECTOMY;  Surgeon: Donato Heinz, MD;  Location: [facility];  Service: General;  Laterality: N/A;  . ESOPHAGOGASTRODUODENOSCOPY N/A 04/30/2013   HQP:RFFMBWGY distal esophagus of uncertain clinical significance-status post biopsy. Tiny hiatal hernia. No explanation  . EXAMINATION UNDER ANESTHESIA  04/21/2012   Gluteal wound repair  . WISDOM TOOTH EXTRACTION     The following portions of the patient's history were reviewed and updated as appropriate: allergies, current medications, past family history, past medical history, past social history, past surgical history and problem list.   Health Maintenance:  Needs pap at  New OB visit  Review of Systems:  Pertinent items noted in HPI and remainder of comprehensive ROS otherwise negative.  Physical Exam:   General:  Alert, oriented and cooperative.   Mental Status: Normal mood and affect perceived. Normal judgment and thought content.  Physical exam deferred due to nature of the encounter  LMP 02/13/2019 (Exact Date) per pt. Fall risk is low. PHQ 2 score is 0, and she takes Zoloft.   Labs and Imaging No results found for this or any previous visit (from the past 336 hour(s)). No results found.    Assessment and Plan:     1. Positive pregnancy test +HPTs  2. Less than [redacted] weeks gestation of pregnancy - eat often -OK to take B6 and unisom -take OTC ONV with folic acid  3. Encounter to determine fetal viability of pregnancy, single or unspecified fetus -dating [place] in about 3 weeks        I discussed the assessment and treatment plan with the patient. The patient was provided an opportunity to ask questions and all were answered. The patient agreed with the plan and demonstrated an understanding of the instructions.   The patient was advised to call back or seek an in-person evaluation/go to the ED if the symptoms worsen or if the condition fails to improve as anticipated.  I provided 10 minutes of non-face-to-face time during this encounter.   Derrek Monaco, NP [facility] [employer], [place]

## 2019-04-04 ENCOUNTER — Telehealth: Payer: Self-pay | Admitting: Obstetrics & Gynecology

## 2019-04-04 NOTE — Telephone Encounter (Signed)
Last night patient had some pink spotting. It stopped and then this afternoon had some spotting that was brown. She has mild cramps but is easing off now. Advised patient to monitor her symptoms and to call us for bleeding that is like a period or worsening pain. Patient agreeable.

## 2019-04-04 NOTE — Telephone Encounter (Signed)
Pt is upset she is ob and having some bleeding wants to speak to someone

## 2019-04-10 ENCOUNTER — Other Ambulatory Visit: Payer: Medicaid Other

## 2019-04-12 ENCOUNTER — Ambulatory Visit (INDEPENDENT_AMBULATORY_CARE_PROVIDER_SITE_OTHER): Payer: Medicaid Other

## 2019-04-12 ENCOUNTER — Telehealth: Payer: Self-pay | Admitting: Adult Health

## 2019-04-12 ENCOUNTER — Other Ambulatory Visit: Payer: Self-pay

## 2019-04-12 DIAGNOSIS — O3680X0 Pregnancy with inconclusive fetal viability, not applicable or unspecified: Secondary | ICD-10-CM

## 2019-04-12 DIAGNOSIS — Z3A08 8 weeks gestation of pregnancy: Secondary | ICD-10-CM

## 2019-04-12 MED ORDER — PROMETHAZINE HCL 25 MG PO TABS: 25.0000 mg | ORAL_TABLET | Freq: Four times a day (QID) | ORAL | 1 refills | Status: DC | PRN

## 2019-04-12 NOTE — Telephone Encounter (Signed)
Pt states that she is very nauseated and vomiting daily. She has tried unisom and B6 but this is not helping. Pt also states she is constipated. Would like to see if something can be sent in to help her.

## 2019-04-12 NOTE — Telephone Encounter (Signed)
Rx for phenergan sent to [facility] in [place], try to eat often

## 2019-04-12 NOTE — Progress Notes (Signed)
[place] 8+2 wks,single IUP w/ys,positive fht 171 bpm,normal ovaries bilat,crl 19.45 mm

## 2019-04-16 ENCOUNTER — Encounter: Payer: Self-pay | Admitting: Family Medicine

## 2019-04-16 MED ORDER — VALACYCLOVIR HCL 1 G PO TABS: ORAL_TABLET | ORAL | 5 refills | Status: DC

## 2019-04-18 ENCOUNTER — Telehealth: Payer: Self-pay | Admitting: *Deleted

## 2019-04-18 NOTE — Telephone Encounter (Signed)
Pt states every time she takes the medication for nausea she gets very sick. She has been throwing up since 6am this morning and cannot keep anything down. Requesting to speak with the nurse or something else to be sent in.

## 2019-04-19 MED ORDER — PROMETHAZINE HCL 25 MG RE SUPP: 25.0000 mg | Freq: Four times a day (QID) | RECTAL | 1 refills | Status: DC | PRN

## 2019-04-19 NOTE — Telephone Encounter (Signed)
Has had vomiting and po phenergan not helping, may be worse will try phenergan supp

## 2019-04-19 NOTE — Addendum Note (Signed)
Addended by: Derrek Monaco A on: 04/19/2019 12:36 PM   Modules accepted: Orders

## 2019-05-09 ENCOUNTER — Other Ambulatory Visit: Payer: Self-pay | Admitting: Obstetrics and Gynecology

## 2019-05-09 DIAGNOSIS — Z3682 Encounter for antenatal screening for nuchal translucency: Secondary | ICD-10-CM

## 2019-05-10 ENCOUNTER — Other Ambulatory Visit: Payer: Self-pay

## 2019-05-10 ENCOUNTER — Ambulatory Visit (INDEPENDENT_AMBULATORY_CARE_PROVIDER_SITE_OTHER): Payer: Medicaid Other

## 2019-05-10 ENCOUNTER — Ambulatory Visit (INDEPENDENT_AMBULATORY_CARE_PROVIDER_SITE_OTHER): Payer: Medicaid Other | Admitting: Women's Health

## 2019-05-10 ENCOUNTER — Ambulatory Visit: Payer: Medicaid Other | Admitting: *Deleted

## 2019-05-10 ENCOUNTER — Encounter: Payer: Self-pay | Admitting: Women's Health

## 2019-05-10 VITALS — BP 117/73 | HR 72 | Wt 150.0 lb

## 2019-05-10 DIAGNOSIS — Z3682 Encounter for antenatal screening for nuchal translucency: Secondary | ICD-10-CM | POA: Diagnosis not present

## 2019-05-10 DIAGNOSIS — Z1389 Encounter for screening for other disorder: Secondary | ICD-10-CM

## 2019-05-10 DIAGNOSIS — Z1379 Encounter for other screening for genetic and chromosomal anomalies: Secondary | ICD-10-CM

## 2019-05-10 DIAGNOSIS — Z3481 Encounter for supervision of other normal pregnancy, first trimester: Secondary | ICD-10-CM | POA: Diagnosis not present

## 2019-05-10 DIAGNOSIS — Z3A12 12 weeks gestation of pregnancy: Secondary | ICD-10-CM | POA: Diagnosis not present

## 2019-05-10 DIAGNOSIS — Z3490 Encounter for supervision of normal pregnancy, unspecified, unspecified trimester: Secondary | ICD-10-CM | POA: Insufficient documentation

## 2019-05-10 DIAGNOSIS — O09299 Supervision of pregnancy with other poor reproductive or obstetric history, unspecified trimester: Secondary | ICD-10-CM

## 2019-05-10 DIAGNOSIS — Z1371 Encounter for nonprocreative screening for genetic disease carrier status: Secondary | ICD-10-CM

## 2019-05-10 DIAGNOSIS — Z331 Pregnant state, incidental: Secondary | ICD-10-CM

## 2019-05-10 DIAGNOSIS — O98511 Other viral diseases complicating pregnancy, first trimester: Secondary | ICD-10-CM | POA: Diagnosis not present

## 2019-05-10 DIAGNOSIS — O09291 Supervision of pregnancy with other poor reproductive or obstetric history, first trimester: Secondary | ICD-10-CM | POA: Diagnosis not present

## 2019-05-10 DIAGNOSIS — Z363 Encounter for antenatal screening for malformations: Secondary | ICD-10-CM

## 2019-05-10 DIAGNOSIS — Z8759 Personal history of other complications of pregnancy, childbirth and the puerperium: Secondary | ICD-10-CM

## 2019-05-10 DIAGNOSIS — F419 Anxiety disorder, unspecified: Secondary | ICD-10-CM

## 2019-05-10 DIAGNOSIS — B009 Herpesviral infection, unspecified: Secondary | ICD-10-CM

## 2019-05-10 LAB — POCT URINALYSIS DIPSTICK OB
Blood, UA: NEGATIVE
Glucose, UA: NEGATIVE
Leukocytes, UA: NEGATIVE
Nitrite, UA: NEGATIVE
POC,PROTEIN,UA: NEGATIVE

## 2019-05-10 MED ORDER — BLOOD PRESSURE MONITOR MISC: 0 refills | Status: DC

## 2019-05-10 MED ORDER — ONDANSETRON HCL 4 MG PO TABS: 4.0000 mg | ORAL_TABLET | Freq: Three times a day (TID) | ORAL | 0 refills | Status: DC | PRN

## 2019-05-10 MED ORDER — VALACYCLOVIR HCL 1 G PO TABS: ORAL_TABLET | ORAL | 5 refills | Status: DC

## 2019-05-10 NOTE — Progress Notes (Signed)
[place] 12+2 wks,measurements c/w dates,crl 61.38 mm,NB present,NT 1.4 mm,fhr 157 bpm,normal ovaries bilat,anterior placenta

## 2019-05-10 NOTE — Patient Instructions (Signed)
Cristina Davenport, I greatly value your feedback.  If you receive a survey following your visit with [place] today, we appreciate you taking the time to fill it out.  Thanks, [name], CNM, [facility]  [place]!!! It is now [name] ([place], [place] [address]) Entrance located off of [address]   Begin taking 162mg  (two 81mg  tablets) baby aspirin daily to decrease risk of preeclampsia during pregnancy     Nausea & Vomiting  Have saltine crackers or pretzels by your bed and eat a few bites before you raise your head out of bed in the morning  Eat small frequent meals throughout the day instead of large meals  Drink plenty of fluids throughout the day to stay hydrated, just don't drink a lot of fluids with your meals.  This can make your stomach fill up faster making you feel sick  Do not brush your teeth right after you eat  Products with real ginger are good for nausea, like ginger ale and ginger hard candy Make sure it says made with real ginger!  Sucking on sour candy like lemon heads is also good for nausea  If your prenatal vitamins make you nauseated, take them at night so you will sleep through the nausea  Sea Bands  If you feel like you need medicine for the nausea & vomiting please let us know  If you are unable to keep any fluids or food down please let us know   Constipation  Drink plenty of fluid, preferably water, throughout the day  Eat foods high in fiber such as fruits, vegetables, and grains  Exercise, such as walking, is a good way to keep your bowels regular  Drink warm fluids, especially warm prune juice, or decaf coffee  Eat a 1/2 cup of real oatmeal (not instant), 1/2 cup applesauce, and 1/2-1 cup warm prune juice every day  If needed, you may take Colace (docusate sodium) stool softener once or twice a day to help keep the stool soft.   If you still are having  problems with constipation, you may take Miralax once daily as needed to help keep your bowels regular.   Home Blood Pressure Monitoring for Patients   Your provider has recommended that you check your blood pressure (BP) at least once a week at home. If you do not have a blood pressure cuff at home, one will be provided for you. Contact your provider if you have not received your monitor within 1 week.   Helpful Tips for Accurate Home Blood Pressure Checks  . Don't smoke, exercise, or drink caffeine 30 minutes before checking your BP . Use the restroom before checking your BP (a full bladder can raise your pressure) . Relax in a comfortable upright chair . Feet on the ground . Left arm resting comfortably on a flat surface at the level of your heart . Legs uncrossed . Back supported . Sit quietly and don't talk . Place the cuff on your bare arm . Adjust snuggly, so that only two fingertips can fit between your skin and the top of the cuff . Check 2 readings separated by at least one minute . Keep a log of your BP readings . For a visual, please reference this diagram: http://ccnc.care/bpdiagram  Provider Name: [facility]     Phone: [phone]  Zone 1: ALL CLEAR  Continue to monitor your symptoms:  .  BP reading is less than 140 (top number) or less than 90 (bottom number)  . No right upper stomach pain . No headaches or seeing spots . No feeling nauseated or throwing up . No swelling in face and hands  Zone 2: CAUTION Call your doctor's office for any of the following:  . BP reading is greater than 140 (top number) or greater than 90 (bottom number)  . Stomach pain under your ribs in the middle or right side . Headaches or seeing spots . Feeling nauseated or throwing up . Swelling in face and hands  Zone 3: EMERGENCY  Seek immediate medical care if you have any of the following:  . BP reading is greater than160 (top number) or greater than 110 (bottom number) .  Severe headaches not improving with Tylenol . Serious difficulty catching your breath . Any worsening symptoms from Zone 2    First Trimester of Pregnancy The first trimester of pregnancy is from week 1 until the end of week 12 (months 1 through 3). A week after a sperm fertilizes an egg, the egg will implant on the wall of the uterus. This embryo will begin to develop into a baby. Genes from you and your partner are forming the baby. The female genes determine whether the baby is a boy or a girl. At 6-8 weeks, the eyes and face are formed, and the heartbeat can be seen on ultrasound. At the end of 12 weeks, all the baby's organs are formed.  Now that you are pregnant, you will want to do everything you can to have a healthy baby. Two of the most important things are to get good prenatal care and to follow your health care provider's instructions. Prenatal care is all the medical care you receive before the baby's birth. This care will help prevent, find, and treat any problems during the pregnancy and childbirth. BODY CHANGES Your body goes through many changes during pregnancy. The changes vary from woman to woman.   You may gain or lose a couple of pounds at first.  You may feel sick to your stomach (nauseous) and throw up (vomit). If the vomiting is uncontrollable, call your health care provider.  You may tire easily.  You may develop headaches that can be relieved by medicines approved by your health care provider.  You may urinate more often. Painful urination may mean you have a bladder infection.  You may develop heartburn as a result of your pregnancy.  You may develop constipation because certain hormones are causing the muscles that push waste through your intestines to slow down.  You may develop hemorrhoids or swollen, bulging veins (varicose veins).  Your breasts may begin to grow larger and become tender. Your nipples may stick out more, and the tissue that surrounds them  (areola) may become darker.  Your gums may bleed and may be sensitive to brushing and flossing.  Dark spots or blotches (chloasma, mask of pregnancy) may develop on your face. This will likely fade after the baby is born.  Your menstrual periods will stop.  You may have a loss of appetite.  You may develop cravings for certain kinds of food.  You may have changes in your emotions from day to day, such as being excited to be pregnant or being concerned that something may go wrong with the pregnancy and baby.  You may have more vivid and strange dreams.  You may have changes in your hair. These can include thickening of your  hair, rapid growth, and changes in texture. Some women also have hair loss during or after pregnancy, or hair that feels dry or thin. Your hair will most likely return to normal after your baby is born. WHAT TO EXPECT AT YOUR PRENATAL VISITS During a routine prenatal visit:  You will be weighed to make sure you and the baby are growing normally.  Your blood pressure will be taken.  Your abdomen will be measured to track your baby's growth.  The fetal heartbeat will be listened to starting around week 10 or 12 of your pregnancy.  Test results from any previous visits will be discussed. Your health care provider may ask you:  How you are feeling.  If you are feeling the baby move.  If you have had any abnormal symptoms, such as leaking fluid, bleeding, severe headaches, or abdominal cramping.  If you have any questions. Other tests that may be performed during your first trimester include:  Blood tests to find your blood type and to check for the presence of any previous infections. They will also be used to check for low iron levels (anemia) and Rh antibodies. Later in the pregnancy, blood tests for diabetes will be done along with other tests if problems develop.  Urine tests to check for infections, diabetes, or protein in the urine.  An ultrasound to  confirm the proper growth and development of the baby.  An amniocentesis to check for possible genetic problems.  Fetal screens for spina bifida and Down syndrome.  You may need other tests to make sure you and the baby are doing well. HOME CARE INSTRUCTIONS  Medicines  Follow your health care provider's instructions regarding medicine use. Specific medicines may be either safe or unsafe to take during pregnancy.  Take your prenatal vitamins as directed.  If you develop constipation, try taking a stool softener if your health care provider approves. Diet  Eat regular, well-balanced meals. Choose a variety of foods, such as meat or vegetable-based protein, fish, milk and low-fat dairy products, vegetables, fruits, and whole grain breads and cereals. Your health care provider will help you determine the amount of weight gain that is right for you.  Avoid raw meat and uncooked cheese. These carry germs that can cause birth defects in the baby.  Eating four or five small meals rather than three large meals a day may help relieve nausea and vomiting. If you start to feel nauseous, eating a few soda crackers can be helpful. Drinking liquids between meals instead of during meals also seems to help nausea and vomiting.  If you develop constipation, eat more high-fiber foods, such as fresh vegetables or fruit and whole grains. Drink enough fluids to keep your urine clear or pale yellow. Activity and Exercise  Exercise only as directed by your health care provider. Exercising will help you:  Control your weight.  Stay in shape.  Be prepared for labor and delivery.  Experiencing pain or cramping in the lower abdomen or low back is a good sign that you should stop exercising. Check with your health care provider before continuing normal exercises.  Try to avoid standing for long periods of time. Move your legs often if you must stand in one place for a long time.  Avoid heavy lifting.   Wear low-heeled shoes, and practice good posture.  You may continue to have sex unless your health care provider directs you otherwise. Relief of Pain or Discomfort  Wear a good support bra for breast  tenderness.    Take warm sitz baths to soothe any pain or discomfort caused by hemorrhoids. Use hemorrhoid cream if your health care provider approves.    Rest with your legs elevated if you have leg cramps or low back pain.  If you develop varicose veins in your legs, wear support hose. Elevate your feet for 15 minutes, 3-4 times a day. Limit salt in your diet. Prenatal Care  Schedule your prenatal visits by the twelfth week of pregnancy. They are usually scheduled monthly at first, then more often in the last 2 months before delivery.  Write down your questions. Take them to your prenatal visits.  Keep all your prenatal visits as directed by your health care provider. Safety  Wear your seat belt at all times when driving.  Make a list of emergency phone numbers, including numbers for family, friends, the hospital, and police and fire departments. General Tips  Ask your health care provider for a referral to a local prenatal education class. Begin classes no later than at the beginning of month 6 of your pregnancy.  Ask for help if you have counseling or nutritional needs during pregnancy. Your health care provider can offer advice or refer you to specialists for help with various needs.  Do not use hot tubs, steam rooms, or saunas.  Do not douche or use tampons or scented sanitary pads.  Do not cross your legs for long periods of time.  Avoid cat litter boxes and soil used by cats. These carry germs that can cause birth defects in the baby and possibly loss of the fetus by miscarriage or stillbirth.  Avoid all smoking, herbs, alcohol, and medicines not prescribed by your health care provider. Chemicals in these affect the formation and growth of the baby.  Schedule a dentist  appointment. At home, brush your teeth with a soft toothbrush and be gentle when you floss. SEEK MEDICAL CARE IF:   You have dizziness.  You have mild pelvic cramps, pelvic pressure, or nagging pain in the abdominal area.  You have persistent nausea, vomiting, or diarrhea.  You have a bad smelling vaginal discharge.  You have pain with urination.  You notice increased swelling in your face, hands, legs, or ankles. SEEK IMMEDIATE MEDICAL CARE IF:   You have a fever.  You are leaking fluid from your vagina.  You have spotting or bleeding from your vagina.  You have severe abdominal cramping or pain.  You have rapid weight gain or loss.  You vomit blood or material that looks like coffee grounds.  You are exposed to [place] measles and have never had them.  You are exposed to fifth disease or chickenpox.  You develop a severe headache.  You have shortness of breath.  You have any kind of trauma, such as from a fall or a car accident. Document Released: 10/25/2001 Document Revised: 03/17/2014 Document Reviewed: 09/10/2013 [facility] Patient Information 2015 [place], [place]. This information is not intended to replace advice given to you by your health care provider. Make sure you discuss any questions you have with your health care provider.  Coronavirus (COVID-19) Are you at risk?  Are you at risk for the Coronavirus (COVID-19)?  To be considered HIGH RISK for Coronavirus (COVID-19), you have to meet the following criteria:  . Traveled to [place], [place], [place], [place] or [place]; or in the [place] to [place], [place], [place], or [place]; and have fever, cough, and shortness of breath within the last  2 weeks of travel OR . Been in close contact with a person diagnosed with COVID-19 within the last 2 weeks and have fever, cough, and shortness of breath . IF YOU DO NOT MEET THESE CRITERIA, YOU ARE CONSIDERED LOW RISK FOR COVID-19.  What to do if you are HIGH  RISK for COVID-19?  Marland Kitchen If you are having a medical emergency, call 911. . Seek medical care right away. Before you go to a doctor's office, urgent care or emergency department, call ahead and tell them about your recent travel, contact with someone diagnosed with COVID-19, and your symptoms. You should receive instructions from your physician's office regarding next steps of care.  . When you arrive at healthcare provider, tell the healthcare staff immediately you have returned from visiting [place], [place], [place], [place] or [place]; or traveled in the [place] to [place], [place], [place], or [place]; in the last two weeks or you have been in close contact with a person diagnosed with COVID-19 in the last 2 weeks.   . Tell the health care staff about your symptoms: fever, cough and shortness of breath. . After you have been seen by a medical provider, you will be either: o Tested for (COVID-19) and discharged home on quarantine except to seek medical care if symptoms worsen, and asked to  - Stay home and avoid contact with others until you get your results (4-5 days)  - Avoid travel on public transportation if possible (such as bus, train, or airplane) or o Sent to the Emergency Department by EMS for evaluation, COVID-19 testing, and possible admission depending on your condition and test results.  What to do if you are LOW RISK for COVID-19?  Reduce your risk of any infection by using the same precautions used for avoiding the common cold or flu:  Marland Kitchen Wash your hands often with soap and warm water for at least 20 seconds.  If soap and water are not readily available, use an alcohol-based hand sanitizer with at least 60% alcohol.  . If coughing or sneezing, cover your mouth and nose by coughing or sneezing into the elbow areas of your shirt or coat, into a tissue or into your sleeve (not your hands). . Avoid shaking hands with others and consider head nods or verbal greetings only. .  Avoid touching your eyes, nose, or mouth with unwashed hands.  . Avoid close contact with people who are sick. . Avoid places or events with large numbers of people in one location, like concerts or sporting events. . Carefully consider travel plans you have or are making. . If you are planning any travel outside or inside the [place], visit the CDC's Travelers' Health webpage for the latest health notices. . If you have some symptoms but not all symptoms, continue to monitor at home and seek medical attention if your symptoms worsen. . If you are having a medical emergency, call 911.   [place] / e-Visit: [email or web address]         [facility]: [facility]: [phone]                   [facility]: [phone]

## 2019-05-10 NOTE — Progress Notes (Signed)
INITIAL OBSTETRICAL VISIT Patient name: Cristina Davenport MRN [id number]  Date of birth: 1986/03/22 Chief Complaint:   Initial Prenatal Visit (nt/it)  History of Present Illness:   Cristina Davenport is a 33 y.o. [id number] Caucasian female at [redacted]w[redacted]d by LMP c/w 8wk u/s, with an Estimated Date of Delivery: 11/20/19 being seen today for her initial obstetrical visit.   Her obstetrical history is significant for 36wk IOL for pre-e, then term uncomplicated SVB x 1, SAB x 1.   Today she reports refill on valtrex for fever blister, nausea- phenergan makes her too sleepy, zofran works well. Gets married next Friday! Patient's last menstrual period was 02/13/2019 (exact date). Last pap 11/04/16. Results were: normal Review of Systems:   Pertinent items are noted in HPI Denies cramping/contractions, leakage of fluid, vaginal bleeding, abnormal vaginal discharge w/ itching/odor/irritation, headaches, visual changes, shortness of breath, chest pain, abdominal pain, severe nausea/vomiting, or problems with urination or bowel movements unless otherwise stated above.  Pertinent History Reviewed:  Reviewed past medical,surgical, social, obstetrical and family history.  Reviewed problem list, medications and allergies. OB History  Gravida Para Term Preterm AB Living  4 2 1 1 1 2   SAB TAB Ectopic Multiple Live Births  1 0 0 0 2    # Outcome Date GA Lbr Len/2nd Weight Sex Delivery Anes PTL Lv  4 Current           3 Term 05/17/17 [redacted]w[redacted]d 2033:12 / 00:22 6 lb 6.5 oz (2.905 kg) M Vag-Spont EPI  LIV  2 Preterm 09/28/08 [redacted]w[redacted]d  5 lb 14 oz (2.665 kg) F Vag-Spont EPI Y LIV     Complications: Pre-eclampsia  1 SAB            Physical Assessment:   Vitals:   05/10/19 1012  BP: 117/73  Pulse: 72  Weight: 150 lb (68 kg)  Body mass index is 28.34 kg/m.       Physical Examination:  General appearance - well appearing, and in no distress  Mental status - alert, oriented to person, place, and time  Psych:  She has a  normal mood and affect  Skin - warm and dry, normal color, no suspicious lesions noted  Chest - effort normal, all lung fields clear to auscultation bilaterally  Heart - normal rate and regular rhythm  Abdomen - soft, nontender  Extremities:  No swelling or varicosities noted  Thin prep pap is not done  TODAY'S NT [place] 12+2 wks,measurements c/w dates,crl 61.38 mm,NB present,NT 1.4 mm,fhr 157 bpm,normal ovaries bilat,anterior placenta   Results for orders placed or performed in visit on 05/10/19 (from the past 24 hour(s))  POC Urinalysis Dipstick OB   Collection Time: 05/10/19 10:36 AM  Result Value Ref Range   Color, UA     Clarity, UA     Glucose, UA Negative Negative   Bilirubin, UA     Ketones, UA ne    Spec Grav, UA     Blood, UA neg    pH, UA     POC,PROTEIN,UA Negative Negative, Trace, Small (1+), Moderate (2+), Large (3+), 4+   Urobilinogen, UA     Nitrite, UA neg    Leukocytes, UA Negative Negative   Appearance     Odor      Assessment & Plan:  1) Low-Risk Pregnancy [id number] at [redacted]w[redacted]d with an Estimated Date of Delivery: 11/20/19   2) Initial OB visit  3) H/O pre-e> start ASA 162mg  daily, baseline labs today  4) Anxiety> on zoloft  5) 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 Blood Pressure Monitor MISC    Sig: For regular home bp monitoring during pregnancy    Dispense:  1 each    Refill:  0    Z34.90  . ondansetron (ZOFRAN) 4 MG tablet    Sig: Take 1 tablet (4 mg total) by mouth every 8 (eight) hours as needed for nausea or vomiting.    Dispense:  20 tablet    Refill:  0    Order Specific Question:   Supervising Provider    Answer:   Elonda Husky, LUTHER H [[id number]]  . valACYclovir (VALTREX) 1000 MG tablet    Sig: Take 2 tablets now,then two tablets 12 hours later    Dispense:  4 tablet    Refill:  5    Order Specific Question:   Supervising Provider    Answer:   Tania Ade H [[id number]]    Initial labs obtained  Continue prenatal vitamins Reviewed n/v relief measures and warning s/s to report Reviewed recommended weight gain based on pre-gravid BMI Encouraged well-balanced diet Genetic Screening discussed: requested nt/it, maternit21 Cystic fibrosis, SMA, Fragile X screening discussed requested Ultrasound discussed; fetal survey: requested CCNC completed>PCM not here, form faxed Does not home bp cuff. Rx faxed to [facility]. Check bp weekly, let us know if >140/90.   Follow-up: Return in about 6 weeks (around 06/19/2019) for [place], in person, 2nd IT, QD:IYMEBRA.   Orders Placed This Encounter  Procedures  . GC/Chlamydia Probe Amp  . Urine Culture  . US OB Comp + 14 Wk  . Integrated 1  . Inheritest Core(CF97,SMA,FraX)  . Obstetric Panel, Including HIV  . Sickle cell screen  . Urinalysis, Routine w reflex microscopic  . MaterniT 21 plus Core, Blood  . Pain Management Screening Profile (10S)  . Comprehensive metabolic panel  . Protein / creatinine ratio, urine  . POC Urinalysis Dipstick OB    Roma Schanz CNM, [facility] 05/10/2019 11:29 AM

## 2019-05-11 LAB — INTEGRATED 1

## 2019-05-12 LAB — URINE CULTURE

## 2019-05-12 LAB — PROTEIN / CREATININE RATIO, URINE
Creatinine, Urine: 119.3 mg/dL
Protein, Ur: 12.8 mg/dL
Protein/Creat Ratio: 107 mg/g creat (ref 0–200)

## 2019-05-12 LAB — COMPREHENSIVE METABOLIC PANEL
ALT: 20 IU/L (ref 0–32)
AST: 23 IU/L (ref 0–40)
Albumin/Globulin Ratio: 2 (ref 1.2–2.2)
Albumin: 4.6 g/dL (ref 3.9–5.0)
Alkaline Phosphatase: 42 IU/L (ref 39–117)
BUN/Creatinine Ratio: 15 (ref 9–23)
BUN: 8 mg/dL (ref 6–20)
Bilirubin Total: 0.3 mg/dL (ref 0.0–1.2)
CO2: 19 mmol/L — ABNORMAL LOW (ref 20–29)
Calcium: 9.4 mg/dL (ref 8.7–10.2)
Chloride: 99 mmol/L (ref 96–106)
Creatinine, Ser: 0.53 mg/dL — ABNORMAL LOW (ref 0.57–1.00)
GFR calc Af Amer: 147 mL/min/{1.73_m2} (ref >59)
GFR calc non Af Amer: 128 mL/min/{1.73_m2} (ref >59)
Globulin, Total: 2.3 g/dL (ref 1.5–4.5)
Glucose: 101 mg/dL — ABNORMAL HIGH (ref 65–99)
Potassium: 4 mmol/L (ref 3.5–5.2)
Sodium: 137 mmol/L (ref 134–144)
Total Protein: 6.9 g/dL (ref 6.0–8.5)

## 2019-05-13 LAB — PMP SCREEN PROFILE (10S), URINE
Amphetamine Scrn, Ur: NEGATIVE ng/mL
BARBITURATE SCREEN URINE: NEGATIVE ng/mL
BENZODIAZEPINE SCREEN, URINE: NEGATIVE ng/mL
CANNABINOIDS UR QL SCN: NEGATIVE ng/mL
Cocaine (Metab) Scrn, Ur: NEGATIVE ng/mL
Creatinine(Crt), U: 124 mg/dL (ref 20.0–300.0)
Methadone Screen, Urine: NEGATIVE ng/mL
OXYCODONE+OXYMORPHONE UR QL SCN: NEGATIVE ng/mL
Opiate Scrn, Ur: NEGATIVE ng/mL
Ph of Urine: 8.9 (ref 4.5–8.9)
Phencyclidine Qn, Ur: NEGATIVE ng/mL
Propoxyphene Scrn, Ur: NEGATIVE ng/mL

## 2019-05-13 LAB — MED LIST OPTION NOT SELECTED

## 2019-05-14 LAB — MATERNIT 21 PLUS CORE, BLOOD

## 2019-05-17 DIAGNOSIS — Z3481 Encounter for supervision of other normal pregnancy, first trimester: Secondary | ICD-10-CM | POA: Diagnosis not present

## 2019-05-18 LAB — INHERITEST CORE(CF97,SMA,FRAX)

## 2019-05-23 LAB — INHERITEST CORE(CF97,SMA,FRAX)

## 2019-05-23 LAB — INTEGRATED 1
Crown Rump Length: 61.4 mm
Gest. Age on Collection Date: 12.4 weeks
Maternal Age at EDD: 31.5 yr
Nuchal Translucency (NT): 1.4 mm
Number of Fetuses: 1
PAPP-A Value: 567.8 ng/mL
Weight: 150 [lb_av]

## 2019-05-23 LAB — URINALYSIS, ROUTINE W REFLEX MICROSCOPIC
Bilirubin, UA: NEGATIVE
Glucose, UA: NEGATIVE
Ketones, UA: NEGATIVE
Leukocytes,UA: NEGATIVE
Nitrite, UA: NEGATIVE
RBC, UA: NEGATIVE
Specific Gravity, UA: 1.026 (ref 1.005–1.030)
Urobilinogen, Ur: 1 mg/dL (ref 0.2–1.0)
pH, UA: 8.5 — ABNORMAL HIGH (ref 5.0–7.5)

## 2019-05-23 LAB — MATERNIT 21 PLUS CORE, BLOOD
Fetal Fraction: 6
Result (T21): NEGATIVE
Trisomy 13 (Patau syndrome): NEGATIVE
Trisomy 18 (Edwards syndrome): NEGATIVE
Trisomy 21 (Down syndrome): NEGATIVE

## 2019-05-23 LAB — OBSTETRIC PANEL, INCLUDING HIV
Antibody Screen: NEGATIVE
Basophils Absolute: 0 10*3/uL (ref 0.0–0.2)
Basos: 0 %
EOS (ABSOLUTE): 0.1 10*3/uL (ref 0.0–0.4)
Eos: 1 %
HIV Screen 4th Generation wRfx: NONREACTIVE
Hematocrit: 37.6 % (ref 34.0–46.6)
Hemoglobin: 13 g/dL (ref 11.1–15.9)
Hepatitis B Surface Ag: NEGATIVE
Immature Grans (Abs): 0 10*3/uL (ref 0.0–0.1)
Immature Granulocytes: 0 %
Lymphocytes Absolute: 2 10*3/uL (ref 0.7–3.1)
Lymphs: 21 %
MCH: 30 pg (ref 26.6–33.0)
MCHC: 34.6 g/dL (ref 31.5–35.7)
MCV: 87 fL (ref 79–97)
Monocytes Absolute: 0.5 10*3/uL (ref 0.1–0.9)
Monocytes: 6 %
Neutrophils Absolute: 6.7 10*3/uL (ref 1.4–7.0)
Neutrophils: 72 %
Platelets: 255 10*3/uL (ref 150–450)
RBC: 4.33 x10E6/uL (ref 3.77–5.28)
RDW: 12 % (ref 11.7–15.4)
RPR Ser Ql: NONREACTIVE
Rh Factor: POSITIVE
Rubella Antibodies, IGG: 3.76 index (ref >0.99)
WBC: 9.3 10*3/uL (ref 3.4–10.8)

## 2019-05-23 LAB — SICKLE CELL SCREEN: Sickle Cell Screen: NEGATIVE

## 2019-06-20 ENCOUNTER — Telehealth: Payer: Self-pay | Admitting: Women's Health

## 2019-06-20 NOTE — Telephone Encounter (Signed)
Patient states she has a couple of "bumps" in her vaginal area that she thinks is genital warts.  She has done some "research" and thinks it's could be something related to "HPV". She is scheduled to see Dr Elonda Husky tomorrow but does not feel comfortable talking with him and would rather discuss with Maudie Mercury. She is going to attach and send a Pharmacist, community message with a photo to be reviewed. Please call patient to discuss.

## 2019-06-20 NOTE — Telephone Encounter (Signed)
Pt is seeing Dr. Elonda Husky tomorrow but feels more comfortable talking to a female about a issue she is having. Requesting a call from Maudie Mercury to discuss.

## 2019-06-21 ENCOUNTER — Ambulatory Visit (INDEPENDENT_AMBULATORY_CARE_PROVIDER_SITE_OTHER): Payer: Medicaid Other

## 2019-06-21 ENCOUNTER — Ambulatory Visit (INDEPENDENT_AMBULATORY_CARE_PROVIDER_SITE_OTHER): Payer: Medicaid Other | Admitting: Women's Health

## 2019-06-21 ENCOUNTER — Encounter: Payer: Self-pay | Admitting: Women's Health

## 2019-06-21 ENCOUNTER — Other Ambulatory Visit: Payer: Self-pay

## 2019-06-21 VITALS — BP 113/76 | HR 70 | Wt 156.0 lb

## 2019-06-21 DIAGNOSIS — Z1379 Encounter for other screening for genetic and chromosomal anomalies: Secondary | ICD-10-CM | POA: Diagnosis not present

## 2019-06-21 DIAGNOSIS — Z3481 Encounter for supervision of other normal pregnancy, first trimester: Secondary | ICD-10-CM

## 2019-06-21 DIAGNOSIS — Z3A18 18 weeks gestation of pregnancy: Secondary | ICD-10-CM | POA: Diagnosis not present

## 2019-06-21 DIAGNOSIS — Z1389 Encounter for screening for other disorder: Secondary | ICD-10-CM

## 2019-06-21 DIAGNOSIS — A630 Anogenital (venereal) warts: Secondary | ICD-10-CM

## 2019-06-21 DIAGNOSIS — Z3482 Encounter for supervision of other normal pregnancy, second trimester: Secondary | ICD-10-CM

## 2019-06-21 DIAGNOSIS — Z363 Encounter for antenatal screening for malformations: Secondary | ICD-10-CM

## 2019-06-21 DIAGNOSIS — Z3A12 12 weeks gestation of pregnancy: Secondary | ICD-10-CM | POA: Diagnosis not present

## 2019-06-21 DIAGNOSIS — Z331 Pregnant state, incidental: Secondary | ICD-10-CM

## 2019-06-21 LAB — POCT URINALYSIS DIPSTICK OB
Blood, UA: NEGATIVE
Glucose, UA: NEGATIVE
Ketones, UA: NEGATIVE
Leukocytes, UA: NEGATIVE
Nitrite, UA: NEGATIVE
POC,PROTEIN,UA: NEGATIVE

## 2019-06-21 MED ORDER — TRICHLOROACETIC ACID 80 % EX LIQD: CUTANEOUS | 0 refills | Status: DC

## 2019-06-21 NOTE — Progress Notes (Signed)
LOW-RISK PREGNANCY VISIT Patient name: Cristina Davenport MRN [id number]  Date of birth: 1986-04-28 Chief Complaint:   Routine Prenatal Visit ([place] today; 2nd IT; check spots in vaginal area)  History of Present Illness:   Cristina Davenport is a 33 y.o. T7D2202 female at [redacted]w[redacted]d with an Estimated Date of Delivery: 11/20/19 being seen today for ongoing management of a low-risk pregnancy.  Today she reports vulvar bumps, some popped up in one of her other pregnancies, but now has some new ones. Contractions: Not present. Vag. Bleeding: None.   . denies leaking of fluid. Review of Systems:   Pertinent items are noted in HPI Denies abnormal vaginal discharge w/ itching/odor/irritation, headaches, visual changes, shortness of breath, chest pain, abdominal pain, severe nausea/vomiting, or problems with urination or bowel movements unless otherwise stated above. Pertinent History Reviewed:  Reviewed past medical,surgical, social, obstetrical and family history.  Reviewed problem list, medications and allergies. Physical Assessment:   Vitals:   06/21/19 0927  BP: 113/76  Pulse: 70  Weight: 156 lb (70.8 kg)  Body mass index is 29.48 kg/m.        Physical Examination:   General appearance: Well appearing, and in no distress  Mental status: Alert, oriented to person, place, and time  Skin: Warm & dry  Cardiovascular: Normal heart rate noted  Respiratory: Normal respiratory effort, no distress  Abdomen: Soft, gravid, nontender  Pelvic: vulva w/ 2 hpv appearing lesions, 2 pedunculated skin tags         Extremities: Edema: None  Fetal Status:          [place] 18+2 wks,cx 4.1 cm,anterior placenta gr 0,normal ovaries bilat,breech,fhr 142 bpm,svp of fluid 3.4 cm,efw 188 g 5.9%,AC 25%,FL 3.7%,anatomy complete  Results for orders placed or performed in visit on 06/21/19 (from the past 24 hour(s))  POC Urinalysis Dipstick OB   Collection Time: 06/21/19  9:31 AM  Result Value Ref Range   Color, UA     Clarity,  UA     Glucose, UA Negative Negative   Bilirubin, UA     Ketones, UA neg    Spec Grav, UA     Blood, UA neg    pH, UA     POC,PROTEIN,UA Negative Negative, Trace, Small (1+), Moderate (2+), Large (3+), 4+   Urobilinogen, UA     Nitrite, UA neg    Leukocytes, UA Negative Negative   Appearance     Odor      Assessment & Plan:  1) Low-risk pregnancy [id number] at [redacted]w[redacted]d with an Estimated Date of Delivery: 11/20/19   2) EFW 5.9%, FL 3.7%, HL 14%, AC 25%, BPD/HC normal. Discussed w/ [facility] who reviewed u/s, pt & FOB smaller stature, likely constitutionally small w/ normal AC, will repeat u/s @ 28wks  3) Genital warts> wants to do TCA tx, rx sent to [facility], f/u next week   Meds:  Meds ordered this encounter  Medications  . trichloroacetic acid 80 % LIQD    Sig: Dispense for in office use    Dispense:  15 mL    Refill:  0    Order Specific Question:   Supervising Provider    Answer:   Florian Buff [[id number]]   Labs/procedures today: anatomy u/s, 2nd IT  Plan:  Continue routine obstetrical care   Reviewed: Preterm labor symptoms and general obstetric precautions including but not limited to vaginal bleeding, contractions, leaking of fluid and fetal movement were reviewed in detail with the patient.  All questions were answered. Has home bp cuff.  Check bp weekly, let us know if >140/90.   Follow-up: Return for next week for TCA tx, then 4wks for LROB , MyChart Video.  Orders Placed This Encounter  Procedures  . INTEGRATED 2  . POC Urinalysis Dipstick OB   Roma Schanz CNM, [facility] 06/21/2019 10:20 AM

## 2019-06-21 NOTE — Patient Instructions (Signed)
Cristina Davenport, I greatly value your feedback.  If you receive a survey following your visit with [place] today, we appreciate you taking the time to fill it out.  Thanks, [name], CNM, [facility]  [place]!!! It is now [place] at [facility] ([place], [place] [address]) Entrance located off of [address] [place] parking   Go to [email or web address] to register for FREE online childbirth classes  [place] Pediatricians/Family Doctors:  [facility] [phone]                 [place] [phone] (usually not accepting new patients unless you have family there already, you are always welcome to call and ask)       [facility] Department [phone]       [facility] Pediatricians/Family Doctors:   [facility]: [phone]  [facility]: [phone]  [facility]: [place] Doctors:   [facility]: [facility]: [place]:  [place]: [phone]    Home Blood Pressure Monitoring for Patients   Your provider has recommended that you check your blood pressure (BP) at least once a week at home. If you do not have a blood pressure cuff at home, one will be provided for you. Contact your provider if you have not received your monitor within 1 week.   Helpful Tips for Accurate Home Blood Pressure Checks  . Don't smoke, exercise, or drink caffeine 30 minutes before checking your BP . Use the restroom before checking your BP (a full bladder can raise your pressure) . Relax in a comfortable upright chair . Feet on the ground . Left arm resting comfortably on a flat surface at the level of your heart . Legs uncrossed . Back supported . Sit quietly and don't talk . Place the cuff  on your bare arm . Adjust snuggly, so that only two fingertips can fit between your skin and the top of the cuff . Check 2 readings separated by at least one minute . Keep a log of your BP readings . For a visual, please reference this diagram: http://ccnc.care/bpdiagram  Provider Name: [facility]     Phone: [phone]  Zone 1: ALL CLEAR  Continue to monitor your symptoms:  . BP reading is less than 140 (top number) or less than 90 (bottom number)  . No right upper stomach pain . No headaches or seeing spots . No feeling nauseated or throwing up . No swelling in face and hands  Zone 2: CAUTION Call your doctor's office for any of the following:  . BP reading is greater than 140 (top number) or greater than 90 (bottom number)  . Stomach pain under your ribs in the middle or right side . Headaches or seeing spots . Feeling nauseated or throwing up . Swelling in face and hands  Zone 3: EMERGENCY  Seek immediate medical care if you have any of the following:  . BP reading is greater than160 (top number) or greater than 110 (bottom number) . Severe headaches not improving with Tylenol . Serious difficulty catching your breath . Any worsening symptoms from Zone 2     Second Trimester of Pregnancy The second trimester is from week 14 through week 27 (months 4 through 6). The second trimester is  often a time when you feel your best. Your body has adjusted to being pregnant, and you begin to feel better physically. Usually, morning sickness has lessened or quit completely, you may have more energy, and you may have an increase in appetite. The second trimester is also a time when the fetus is growing rapidly. At the end of the sixth month, the fetus is about 9 inches long and weighs about 1 pounds. You will likely begin to feel the baby move (quickening) between 16 and 20 weeks of pregnancy. Body changes during your second trimester Your body continues to go through many changes  during your second trimester. The changes vary from woman to woman.  Your weight will continue to increase. You will notice your lower abdomen bulging out.  You may begin to get stretch marks on your hips, abdomen, and breasts.  You may develop headaches that can be relieved by medicines. The medicines should be approved by your health care provider.  You may urinate more often because the fetus is pressing on your bladder.  You may develop or continue to have heartburn as a result of your pregnancy.  You may develop constipation because certain hormones are causing the muscles that push waste through your intestines to slow down.  You may develop hemorrhoids or swollen, bulging veins (varicose veins).  You may have back pain. This is caused by: ? Weight gain. ? Pregnancy hormones that are relaxing the joints in your pelvis. ? A shift in weight and the muscles that support your balance.  Your breasts will continue to grow and they will continue to become tender.  Your gums may bleed and may be sensitive to brushing and flossing.  Dark spots or blotches (chloasma, mask of pregnancy) may develop on your face. This will likely fade after the baby is born.  A dark line from your belly button to the pubic area (linea nigra) may appear. This will likely fade after the baby is born.  You may have changes in your hair. These can include thickening of your hair, rapid growth, and changes in texture. Some women also have hair loss during or after pregnancy, or hair that feels dry or thin. Your hair will most likely return to normal after your baby is born.  What to expect at prenatal visits During a routine prenatal visit:  You will be weighed to make sure you and the fetus are growing normally.  Your blood pressure will be taken.  Your abdomen will be measured to track your baby's growth.  The fetal heartbeat will be listened to.  Any test results from the previous visit will be  discussed.  Your health care provider may ask you:  How you are feeling.  If you are feeling the baby move.  If you have had any abnormal symptoms, such as leaking fluid, bleeding, severe headaches, or abdominal cramping.  If you are using any tobacco products, including cigarettes, chewing tobacco, and electronic cigarettes.  If you have any questions.  Other tests that may be performed during your second trimester include:  Blood tests that check for: ? Low iron levels (anemia). ? High blood sugar that affects pregnant women (gestational diabetes) between 2 and 28 weeks. ? Rh antibodies. This is to check for a protein on red blood cells (Rh factor).  Urine tests to check for infections, diabetes, or protein in the urine.  An ultrasound to confirm the proper growth and development of the baby.  An amniocentesis to  check for possible genetic problems.  Fetal screens for spina bifida and Down syndrome.  HIV (human immunodeficiency virus) testing. Routine prenatal testing includes screening for HIV, unless you choose not to have this test.  Follow these instructions at home: Medicines  Follow your health care provider's instructions regarding medicine use. Specific medicines may be either safe or unsafe to take during pregnancy.  Take a prenatal vitamin that contains at least 600 micrograms (mcg) of folic acid.  If you develop constipation, try taking a stool softener if your health care provider approves. Eating and drinking  Eat a balanced diet that includes fresh fruits and vegetables, whole grains, good sources of protein such as meat, eggs, or tofu, and low-fat dairy. Your health care provider will help you determine the amount of weight gain that is right for you.  Avoid raw meat and uncooked cheese. These carry germs that can cause birth defects in the baby.  If you have low calcium intake from food, talk to your health care provider about whether you should take a  daily calcium supplement.  Limit foods that are high in fat and processed sugars, such as fried and sweet foods.  To prevent constipation: ? Drink enough fluid to keep your urine clear or pale yellow. ? Eat foods that are high in fiber, such as fresh fruits and vegetables, whole grains, and beans. Activity  Exercise only as directed by your health care provider. Most women can continue their usual exercise routine during pregnancy. Try to exercise for 30 minutes at least 5 days a week. Stop exercising if you experience uterine contractions.  Avoid heavy lifting, wear low heel shoes, and practice good posture.  A sexual relationship may be continued unless your health care provider directs you otherwise. Relieving pain and discomfort  Wear a good support bra to prevent discomfort from breast tenderness.  Take warm sitz baths to soothe any pain or discomfort caused by hemorrhoids. Use hemorrhoid cream if your health care provider approves.  Rest with your legs elevated if you have leg cramps or low back pain.  If you develop varicose veins, wear support hose. Elevate your feet for 15 minutes, 3-4 times a day. Limit salt in your diet. Prenatal Care  Write down your questions. Take them to your prenatal visits.  Keep all your prenatal visits as told by your health care provider. This is important. Safety  Wear your seat belt at all times when driving.  Make a list of emergency phone numbers, including numbers for family, friends, the hospital, and police and fire departments. General instructions  Ask your health care provider for a referral to a local prenatal education class. Begin classes no later than the beginning of month 6 of your pregnancy.  Ask for help if you have counseling or nutritional needs during pregnancy. Your health care provider can offer advice or refer you to specialists for help with various needs.  Do not use hot tubs, steam rooms, or saunas.  Do not  douche or use tampons or scented sanitary pads.  Do not cross your legs for long periods of time.  Avoid cat litter boxes and soil used by cats. These carry germs that can cause birth defects in the baby and possibly loss of the fetus by miscarriage or stillbirth.  Avoid all smoking, herbs, alcohol, and unprescribed drugs. Chemicals in these products can affect the formation and growth of the baby.  Do not use any products that contain nicotine or tobacco, such as  cigarettes and e-cigarettes. If you need help quitting, ask your health care provider.  Visit your dentist if you have not gone yet during your pregnancy. Use a soft toothbrush to brush your teeth and be gentle when you floss. Contact a health care provider if:  You have dizziness.  You have mild pelvic cramps, pelvic pressure, or nagging pain in the abdominal area.  You have persistent nausea, vomiting, or diarrhea.  You have a bad smelling vaginal discharge.  You have pain when you urinate. Get help right away if:  You have a fever.  You are leaking fluid from your vagina.  You have spotting or bleeding from your vagina.  You have severe abdominal cramping or pain.  You have rapid weight gain or weight loss.  You have shortness of breath with chest pain.  You notice sudden or extreme swelling of your face, hands, ankles, feet, or legs.  You have not felt your baby move in over an hour.  You have severe headaches that do not go away when you take medicine.  You have vision changes. Summary  The second trimester is from week 14 through week 27 (months 4 through 6). It is also a time when the fetus is growing rapidly.  Your body goes through many changes during pregnancy. The changes vary from woman to woman.  Avoid all smoking, herbs, alcohol, and unprescribed drugs. These chemicals affect the formation and growth your baby.  Do not use any tobacco products, such as cigarettes, chewing tobacco, and  e-cigarettes. If you need help quitting, ask your health care provider.  Contact your health care provider if you have any questions. Keep all prenatal visits as told by your health care provider. This is important. This information is not intended to replace advice given to you by your health care provider. Make sure you discuss any questions you have with your health care provider. Document Released: 10/25/2001 Document Revised: 04/07/2016 Document Reviewed: 01/01/2013 Elsevier Interactive Patient Education  2017 [employer].

## 2019-06-21 NOTE — Progress Notes (Signed)
[place] 18+2 wks,cx 4.1 cm,anterior placenta gr 0,normal ovaries bilat,breech,fhr 142 bpm,svp of fluid 3.4 cm,efw 188 g 5.9%,AC 25%,FL 3.7%,anatomy complete

## 2019-06-25 ENCOUNTER — Ambulatory Visit (INDEPENDENT_AMBULATORY_CARE_PROVIDER_SITE_OTHER): Payer: Medicaid Other | Admitting: Women's Health

## 2019-06-25 ENCOUNTER — Encounter: Payer: Self-pay | Admitting: Women's Health

## 2019-06-25 ENCOUNTER — Other Ambulatory Visit: Payer: Self-pay

## 2019-06-25 VITALS — BP 109/71 | HR 80 | Wt 157.0 lb

## 2019-06-25 DIAGNOSIS — A630 Anogenital (venereal) warts: Secondary | ICD-10-CM

## 2019-06-25 DIAGNOSIS — Z3482 Encounter for supervision of other normal pregnancy, second trimester: Secondary | ICD-10-CM

## 2019-06-25 DIAGNOSIS — Z331 Pregnant state, incidental: Secondary | ICD-10-CM

## 2019-06-25 DIAGNOSIS — Z3A18 18 weeks gestation of pregnancy: Secondary | ICD-10-CM

## 2019-06-25 DIAGNOSIS — Z1389 Encounter for screening for other disorder: Secondary | ICD-10-CM

## 2019-06-25 LAB — INTEGRATED 2
AFP MoM: 1.65
Alpha-Fetoprotein: 68.4 ng/mL
Crown Rump Length: 61.4 mm
DIA MoM: 3.87
DIA Value: 640.7 pg/mL
Estriol, Unconjugated: 1 ng/mL
Gest. Age on Collection Date: 12.4 weeks
Gestational Age: 18.4 weeks
Maternal Age at EDD: 31.5 yr
Nuchal Translucency (NT): 1.4 mm
Nuchal Translucency MoM: 1.02
Number of Fetuses: 1
PAPP-A MoM: 0.55
PAPP-A Value: 567.8 ng/mL
Test Results:: NEGATIVE
Weight: 150 [lb_av]
Weight: 156 [lb_av]
hCG MoM: 2.84
hCG Value: 68.6 IU/mL
uE3 MoM: 0.68

## 2019-06-25 LAB — POCT URINALYSIS DIPSTICK OB
Blood, UA: NEGATIVE
Glucose, UA: NEGATIVE
Ketones, UA: NEGATIVE
Leukocytes, UA: NEGATIVE
Nitrite, UA: NEGATIVE
POC,PROTEIN,UA: NEGATIVE

## 2019-06-25 NOTE — Progress Notes (Signed)
   LOW-RISK PREGNANCY VISIT Patient name: Cristina Davenport MRN [id number]  Date of birth: 17-Jul-1986 Chief Complaint:   Routine Prenatal Visit (TCA treatment; discuss labs)  History of Present Illness:   Cristina Davenport is a 33 y.o. [id number] female at [redacted]w[redacted]d with an Estimated Date of Delivery: 11/20/19 being seen today for TCA tx of genital warts and ongoing management of a low-risk pregnancy.  Today she reports no complaints. Contractions: Not present. Vag. Bleeding: None.  Movement: Absent. denies leaking of fluid. Review of Systems:   Pertinent items are noted in HPI Denies abnormal vaginal discharge w/ itching/odor/irritation, headaches, visual changes, shortness of breath, chest pain, abdominal pain, severe nausea/vomiting, or problems with urination or bowel movements unless otherwise stated above. Pertinent History Reviewed:  Reviewed past medical,surgical, social, obstetrical and family history.  Reviewed problem list, medications and allergies. Physical Assessment:   Vitals:   06/25/19 0917  BP: 109/71  Pulse: 80  Weight: 157 lb (71.2 kg)  Body mass index is 29.66 kg/m.        Physical Examination:   General appearance: Well appearing, and in no distress  Mental status: Alert, oriented to person, place, and time  Skin: Warm & dry  Cardiovascular: Normal heart rate noted  Respiratory: Normal respiratory effort, no distress  Abdomen: Soft, gravid, nontender  Pelvic: TCA applied to 4 vulvar condyloma          Extremities: Edema: None  Fetal Status: Fetal Heart Rate (bpm): 144   Movement: Absent    Results for orders placed or performed in visit on 06/25/19 (from the past 24 hour(s))  POC Urinalysis Dipstick OB   Collection Time: 06/25/19  9:18 AM  Result Value Ref Range   Color, UA     Clarity, UA     Glucose, UA Negative Negative   Bilirubin, UA     Ketones, UA neg    Spec Grav, UA     Blood, UA neg    pH, UA     POC,PROTEIN,UA Negative Negative, Trace, Small (1+),  Moderate (2+), Large (3+), 4+   Urobilinogen, UA     Nitrite, UA neg    Leukocytes, UA Negative Negative   Appearance     Odor      Assessment & Plan:  1) Low-risk pregnancy [id number] at [redacted]w[redacted]d with an Estimated Date of Delivery: 11/20/19   2) Vulvar condyloma, 1st TCA tx today   Meds: No orders of the defined types were placed in this encounter.  Labs/procedures today: TCA  Plan:  Continue routine obstetrical care   Reviewed: Preterm labor symptoms and general obstetric precautions including but not limited to vaginal bleeding, contractions, leaking of fluid and fetal movement were reviewed in detail with the patient.  All questions were answered. Has home bp cuff. Check bp weekly, let us know if >140/90.   Follow-up: Return for change 9/4 appt to in person w/ me for LROB and TCA.  Orders Placed This Encounter  Procedures  . POC Urinalysis Dipstick OB   Roma Schanz CNM, [facility] 06/25/2019 9:32 AM

## 2019-06-25 NOTE — Patient Instructions (Signed)
Cristina Davenport, I greatly value your feedback.  If you receive a survey following your visit with [place] today, we appreciate you taking the time to fill it out.  Thanks, [name], CNM, [facility]!!! It is now [place] at [facility] ([place], [place] [address]) Entrance located off of [address]   Go to [email or web address] to register for FREE online childbirth classes  [place] Pediatricians/Family Doctors:  [facility] [phone]                 [place] [phone] (usually not accepting new patients unless you have family there already, you are always welcome to call and ask)       [facility] Department [phone]       [facility] Pediatricians/Family Doctors:   [facility]: [phone]  [facility]: [phone]  [facility]: [place] Doctors:   [facility]: [facility]: [place]:  [place]: [phone]    Home Blood Pressure Monitoring for Patients   Your provider has recommended that you check your blood pressure (BP) at least once a week at home. If you do not have a blood pressure cuff at home, one will be provided for you. Contact your provider if you have not received your monitor within 1 week.   Helpful Tips for Accurate Home Blood Pressure Checks  . Don't smoke, exercise, or drink caffeine 30 minutes before checking your BP . Use the restroom before checking your BP (a full bladder can raise your pressure) . Relax in a comfortable upright chair . Feet on the ground . Left arm resting comfortably on a flat surface at the level of your heart . Legs uncrossed . Back supported . Sit quietly and don't talk . Place the cuff  on your bare arm . Adjust snuggly, so that only two fingertips can fit between your skin and the top of the cuff . Check 2 readings separated by at least one minute . Keep a log of your BP readings . For a visual, please reference this diagram: http://ccnc.care/bpdiagram  Provider Name: [facility]     Phone: [phone]  Zone 1: ALL CLEAR  Continue to monitor your symptoms:  . BP reading is less than 140 (top number) or less than 90 (bottom number)  . No right upper stomach pain . No headaches or seeing spots . No feeling nauseated or throwing up . No swelling in face and hands  Zone 2: CAUTION Call your doctor's office for any of the following:  . BP reading is greater than 140 (top number) or greater than 90 (bottom number)  . Stomach pain under your ribs in the middle or right side . Headaches or seeing spots . Feeling nauseated or throwing up . Swelling in face and hands  Zone 3: EMERGENCY  Seek immediate medical care if you have any of the following:  . BP reading is greater than160 (top number) or greater than 110 (bottom number) . Severe headaches not improving with Tylenol . Serious difficulty catching your breath . Any worsening symptoms from Zone 2     Second Trimester of Pregnancy The second trimester is from week 14 through week 27 (months 4 through 6). The second trimester is  often a time when you feel your best. Your body has adjusted to being pregnant, and you begin to feel better physically. Usually, morning sickness has lessened or quit completely, you may have more energy, and you may have an increase in appetite. The second trimester is also a time when the fetus is growing rapidly. At the end of the sixth month, the fetus is about 9 inches long and weighs about 1 pounds. You will likely begin to feel the baby move (quickening) between 16 and 20 weeks of pregnancy. Body changes during your second trimester Your body continues to go through many changes  during your second trimester. The changes vary from woman to woman.  Your weight will continue to increase. You will notice your lower abdomen bulging out.  You may begin to get stretch marks on your hips, abdomen, and breasts.  You may develop headaches that can be relieved by medicines. The medicines should be approved by your health care provider.  You may urinate more often because the fetus is pressing on your bladder.  You may develop or continue to have heartburn as a result of your pregnancy.  You may develop constipation because certain hormones are causing the muscles that push waste through your intestines to slow down.  You may develop hemorrhoids or swollen, bulging veins (varicose veins).  You may have back pain. This is caused by: ? Weight gain. ? Pregnancy hormones that are relaxing the joints in your pelvis. ? A shift in weight and the muscles that support your balance.  Your breasts will continue to grow and they will continue to become tender.  Your gums may bleed and may be sensitive to brushing and flossing.  Dark spots or blotches (chloasma, mask of pregnancy) may develop on your face. This will likely fade after the baby is born.  A dark line from your belly button to the pubic area (linea nigra) may appear. This will likely fade after the baby is born.  You may have changes in your hair. These can include thickening of your hair, rapid growth, and changes in texture. Some women also have hair loss during or after pregnancy, or hair that feels dry or thin. Your hair will most likely return to normal after your baby is born.  What to expect at prenatal visits During a routine prenatal visit:  You will be weighed to make sure you and the fetus are growing normally.  Your blood pressure will be taken.  Your abdomen will be measured to track your baby's growth.  The fetal heartbeat will be listened to.  Any test results from the previous visit will be  discussed.  Your health care provider may ask you:  How you are feeling.  If you are feeling the baby move.  If you have had any abnormal symptoms, such as leaking fluid, bleeding, severe headaches, or abdominal cramping.  If you are using any tobacco products, including cigarettes, chewing tobacco, and electronic cigarettes.  If you have any questions.  Other tests that may be performed during your second trimester include:  Blood tests that check for: ? Low iron levels (anemia). ? High blood sugar that affects pregnant women (gestational diabetes) between 41 and 28 weeks. ? Rh antibodies. This is to check for a protein on red blood cells (Rh factor).  Urine tests to check for infections, diabetes, or protein in the urine.  An ultrasound to confirm the proper growth and development of the baby.  An amniocentesis to  check for possible genetic problems.  Fetal screens for spina bifida and Down syndrome.  HIV (human immunodeficiency virus) testing. Routine prenatal testing includes screening for HIV, unless you choose not to have this test.  Follow these instructions at home: Medicines  Follow your health care provider's instructions regarding medicine use. Specific medicines may be either safe or unsafe to take during pregnancy.  Take a prenatal vitamin that contains at least 600 micrograms (mcg) of folic acid.  If you develop constipation, try taking a stool softener if your health care provider approves. Eating and drinking  Eat a balanced diet that includes fresh fruits and vegetables, whole grains, good sources of protein such as meat, eggs, or tofu, and low-fat dairy. Your health care provider will help you determine the amount of weight gain that is right for you.  Avoid raw meat and uncooked cheese. These carry germs that can cause birth defects in the baby.  If you have low calcium intake from food, talk to your health care provider about whether you should take a  daily calcium supplement.  Limit foods that are high in fat and processed sugars, such as fried and sweet foods.  To prevent constipation: ? Drink enough fluid to keep your urine clear or pale yellow. ? Eat foods that are high in fiber, such as fresh fruits and vegetables, whole grains, and beans. Activity  Exercise only as directed by your health care provider. Most women can continue their usual exercise routine during pregnancy. Try to exercise for 30 minutes at least 5 days a week. Stop exercising if you experience uterine contractions.  Avoid heavy lifting, wear low heel shoes, and practice good posture.  A sexual relationship may be continued unless your health care provider directs you otherwise. Relieving pain and discomfort  Wear a good support bra to prevent discomfort from breast tenderness.  Take warm sitz baths to soothe any pain or discomfort caused by hemorrhoids. Use hemorrhoid cream if your health care provider approves.  Rest with your legs elevated if you have leg cramps or low back pain.  If you develop varicose veins, wear support hose. Elevate your feet for 15 minutes, 3-4 times a day. Limit salt in your diet. Prenatal Care  Write down your questions. Take them to your prenatal visits.  Keep all your prenatal visits as told by your health care provider. This is important. Safety  Wear your seat belt at all times when driving.  Make a list of emergency phone numbers, including numbers for family, friends, the hospital, and police and fire departments. General instructions  Ask your health care provider for a referral to a local prenatal education class. Begin classes no later than the beginning of month 6 of your pregnancy.  Ask for help if you have counseling or nutritional needs during pregnancy. Your health care provider can offer advice or refer you to specialists for help with various needs.  Do not use hot tubs, steam rooms, or saunas.  Do not  douche or use tampons or scented sanitary pads.  Do not cross your legs for long periods of time.  Avoid cat litter boxes and soil used by cats. These carry germs that can cause birth defects in the baby and possibly loss of the fetus by miscarriage or stillbirth.  Avoid all smoking, herbs, alcohol, and unprescribed drugs. Chemicals in these products can affect the formation and growth of the baby.  Do not use any products that contain nicotine or tobacco, such as  cigarettes and e-cigarettes. If you need help quitting, ask your health care provider.  Visit your dentist if you have not gone yet during your pregnancy. Use a soft toothbrush to brush your teeth and be gentle when you floss. Contact a health care provider if:  You have dizziness.  You have mild pelvic cramps, pelvic pressure, or nagging pain in the abdominal area.  You have persistent nausea, vomiting, or diarrhea.  You have a bad smelling vaginal discharge.  You have pain when you urinate. Get help right away if:  You have a fever.  You are leaking fluid from your vagina.  You have spotting or bleeding from your vagina.  You have severe abdominal cramping or pain.  You have rapid weight gain or weight loss.  You have shortness of breath with chest pain.  You notice sudden or extreme swelling of your face, hands, ankles, feet, or legs.  You have not felt your baby move in over an hour.  You have severe headaches that do not go away when you take medicine.  You have vision changes. Summary  The second trimester is from week 14 through week 27 (months 4 through 6). It is also a time when the fetus is growing rapidly.  Your body goes through many changes during pregnancy. The changes vary from woman to woman.  Avoid all smoking, herbs, alcohol, and unprescribed drugs. These chemicals affect the formation and growth your baby.  Do not use any tobacco products, such as cigarettes, chewing tobacco, and  e-cigarettes. If you need help quitting, ask your health care provider.  Contact your health care provider if you have any questions. Keep all prenatal visits as told by your health care provider. This is important. This information is not intended to replace advice given to you by your health care provider. Make sure you discuss any questions you have with your health care provider. Document Released: 10/25/2001 Document Revised: 04/07/2016 Document Reviewed: 01/01/2013 Elsevier Interactive Patient Education  2017 [employer].

## 2019-06-27 LAB — GC/CHLAMYDIA PROBE AMP
Chlamydia trachomatis, NAA: NEGATIVE
Neisseria Gonorrhoeae by PCR: NEGATIVE

## 2019-07-15 ENCOUNTER — Telehealth: Payer: Self-pay | Admitting: Women's Health

## 2019-07-15 NOTE — Telephone Encounter (Signed)
Pt states that she has been having contractions on and off this weekend that wake her up during the night. She states she is drinking plenty of water. States with her last pregnancy she was prescribed procardia XL and is wanting to see if that can be sent in for her.

## 2019-07-19 ENCOUNTER — Other Ambulatory Visit: Payer: Self-pay

## 2019-07-19 ENCOUNTER — Encounter: Payer: Self-pay | Admitting: Women's Health

## 2019-07-19 ENCOUNTER — Telehealth: Payer: Medicaid Other | Admitting: Obstetrics & Gynecology

## 2019-07-19 ENCOUNTER — Ambulatory Visit (INDEPENDENT_AMBULATORY_CARE_PROVIDER_SITE_OTHER): Payer: Medicaid Other | Admitting: Women's Health

## 2019-07-19 VITALS — BP 113/72 | HR 80 | Wt 162.5 lb

## 2019-07-19 DIAGNOSIS — Z3A22 22 weeks gestation of pregnancy: Secondary | ICD-10-CM

## 2019-07-19 DIAGNOSIS — Z1389 Encounter for screening for other disorder: Secondary | ICD-10-CM

## 2019-07-19 DIAGNOSIS — Z331 Pregnant state, incidental: Secondary | ICD-10-CM

## 2019-07-19 DIAGNOSIS — Z3482 Encounter for supervision of other normal pregnancy, second trimester: Secondary | ICD-10-CM

## 2019-07-19 DIAGNOSIS — A630 Anogenital (venereal) warts: Secondary | ICD-10-CM | POA: Diagnosis not present

## 2019-07-19 DIAGNOSIS — O98312 Other infections with a predominantly sexual mode of transmission complicating pregnancy, second trimester: Secondary | ICD-10-CM

## 2019-07-19 LAB — POCT URINALYSIS DIPSTICK OB
Blood, UA: NEGATIVE
Glucose, UA: NEGATIVE
Ketones, UA: NEGATIVE
Leukocytes, UA: NEGATIVE
Nitrite, UA: NEGATIVE
POC,PROTEIN,UA: NEGATIVE

## 2019-07-19 NOTE — Progress Notes (Signed)
   LOW-RISK PREGNANCY VISIT Patient name: Cristina Davenport MRN [id number]  Date of birth: 1986-09-23 Chief Complaint:   Routine Prenatal Visit (TCA treatment; contractions over the weekend; decreased baby movement )  History of Present Illness:   Cristina Davenport is a 33 y.o. [id number] female at [redacted]w[redacted]d with an Estimated Date of Delivery: 11/20/19 being seen today for ongoing management of a low-risk pregnancy.  Today she reports contractions over weekend, worse at night, would wake her up, vomited a couple of times, started taking phenergan, no uc's since. . Contractions: Irregular. Vag. Bleeding: None.  Movement: Present. denies leaking of fluid. Review of Systems:   Pertinent items are noted in HPI Denies abnormal vaginal discharge w/ itching/odor/irritation, headaches, visual changes, shortness of breath, chest pain, abdominal pain, severe nausea/vomiting, or problems with urination or bowel movements unless otherwise stated above. Pertinent History Reviewed:  Reviewed past medical,surgical, social, obstetrical and family history.  Reviewed problem list, medications and allergies. Physical Assessment:   Vitals:   07/19/19 0852  BP: 113/72  Pulse: 80  Weight: 162 lb 8 oz (73.7 kg)  Body mass index is 30.7 kg/m.        Physical Examination:   General appearance: Well appearing, and in no distress  Mental status: Alert, oriented to person, place, and time  Skin: Warm & dry  Cardiovascular: Normal heart rate noted  Respiratory: Normal respiratory effort, no distress  Abdomen: Soft, gravid, nontender  Pelvic: TCA applied to 3 condyloma, one that was previously there is now gone. Folliculitis Rt outer labia majora, cleansed w/ betadine, able to get small hair out        Extremities: Edema: None  Fetal Status: Fetal Heart Rate (bpm): 144 Fundal Height: 22 cm Movement: Present    Results for orders placed or performed in visit on 07/19/19 (from the past 24 hour(s))  POC Urinalysis Dipstick OB    Collection Time: 07/19/19  8:53 AM  Result Value Ref Range   Color, UA     Clarity, UA     Glucose, UA Negative Negative   Bilirubin, UA     Ketones, UA neg    Spec Grav, UA     Blood, UA neg    pH, UA     POC,PROTEIN,UA Negative Negative, Trace, Small (1+), Moderate (2+), Large (3+), 4+   Urobilinogen, UA     Nitrite, UA neg    Leukocytes, UA Negative Negative   Appearance     Odor      Assessment & Plan:  1) Low-risk pregnancy [id number] at [redacted]w[redacted]d with an Estimated Date of Delivery: 11/20/19   2) Vulvar condyloma, 2nd TCA tx today, 1 has already gone away  3) Folliculitis> able to remove small hair, watch over weekend, if doesn't improve let me know and will rx antibiotics   Meds: No orders of the defined types were placed in this encounter.  Labs/procedures today: TCA  Plan:  Continue routine obstetrical care   Reviewed: Preterm labor symptoms and general obstetric precautions including but not limited to vaginal bleeding, contractions, leaking of fluid and fetal movement were reviewed in detail with the patient.  All questions were answered.   Follow-up: Return in about 1 week (around 07/26/2019) for OB, F/U TCA tx, in person.  Orders Placed This Encounter  Procedures  . POC Urinalysis Dipstick OB   Roma Schanz CNM, [facility] 07/19/2019 9:29 AM

## 2019-07-19 NOTE — Patient Instructions (Signed)
Cristina Davenport, I greatly value your feedback.  If you receive a survey following your visit with [place] today, we appreciate you taking the time to fill it out.  Thanks, [name], CNM, [facility]  [place]!!! It is now [place] at [facility] ([place], [place] [address]) Entrance located off of [place] parking   Go to [email or web address] to register for FREE online childbirth classes    Call the office [phone]) or go to [facility] if:  You begin to have strong, frequent contractions  Your water breaks.  Sometimes it is a big gush of fluid, sometimes it is just a trickle that keeps getting your panties wet or running down your legs  You have vaginal bleeding.  It is normal to have a small amount of spotting if your cervix was checked.   You don't feel your baby moving like normal.  If you don't, get you something to eat and drink and lay down and focus on feeling your baby move.  You should feel at least 10 movements in 2 hours.  If you don't, you should call the office or go to [place] Blood Pressure Monitoring for Patients   Your provider has recommended that you check your blood pressure (BP) at least once a week at home. If you do not have a blood pressure cuff at home, one will be provided for you. Contact your provider if you have not received your monitor within 1 week.   Helpful Tips for Accurate Home Blood Pressure Checks  . Don't smoke, exercise, or drink caffeine 30 minutes before checking your BP . Use the restroom before checking your BP (a full bladder can raise your pressure) . Relax in a comfortable upright chair . Feet on the ground . Left arm resting comfortably on a flat surface at the level of your heart . Legs uncrossed . Back supported . Sit quietly and don't talk . Place the cuff on your bare arm . Adjust snuggly, so that only two fingertips can fit between your skin and  the top of the cuff . Check 2 readings separated by at least one minute . Keep a log of your BP readings . For a visual, please reference this diagram: http://ccnc.care/bpdiagram  Provider Name: [facility]     Phone: [phone]  Zone 1: ALL CLEAR  Continue to monitor your symptoms:  . BP reading is less than 140 (top number) or less than 90 (bottom number)  . No right upper stomach pain . No headaches or seeing spots . No feeling nauseated or throwing up . No swelling in face and hands  Zone 2: CAUTION Call your doctor's office for any of the following:  . BP reading is greater than 140 (top number) or greater than 90 (bottom number)  . Stomach pain under your ribs in the middle or right side . Headaches or seeing spots . Feeling nauseated or throwing up . Swelling in face and hands  Zone 3: EMERGENCY  Seek immediate medical care if you have any of the following:  . BP reading is greater than160 (top number) or greater than 110 (bottom number) . Severe headaches not improving with Tylenol . Serious difficulty catching your breath . Any worsening symptoms from Zone 2    Preterm Labor and Birth Information  The normal length of a pregnancy is 39-41 weeks. Preterm labor is when labor starts  before 37 completed weeks of pregnancy. What are the risk factors for preterm labor? Preterm labor is more likely to occur in women who:  Have certain infections during pregnancy such as a bladder infection, sexually transmitted infection, or infection inside the uterus (chorioamnionitis).  Have a shorter-than-normal cervix.  Have gone into preterm labor before.  Have had surgery on their cervix.  Are younger than age 79 or older than age 80.  Are African American.  Are pregnant with twins or multiple babies (multiple gestation).  Take street drugs or smoke while pregnant.  Do not gain enough weight while pregnant.  Became pregnant shortly after having been pregnant.  What are the symptoms of preterm labor? Symptoms of preterm labor include:  Cramps similar to those that can happen during a menstrual period. The cramps may happen with diarrhea.  Pain in the abdomen or lower back.  Regular uterine contractions that may feel like tightening of the abdomen.  A feeling of increased pressure in the pelvis.  Increased watery or bloody mucus discharge from the vagina.  Water breaking (ruptured amniotic sac). Why is it important to recognize signs of preterm labor? It is important to recognize signs of preterm labor because babies who are born prematurely may not be fully developed. This can put them at an increased risk for:  Long-term (chronic) heart and lung problems.  Difficulty immediately after birth with regulating body systems, including blood sugar, body temperature, heart rate, and breathing rate.  Bleeding in the brain.  Cerebral palsy.  Learning difficulties.  Death. These risks are highest for babies who are born before 50 weeks of pregnancy. How is preterm labor treated? Treatment depends on the length of your pregnancy, your condition, and the health of your baby. It may involve:  Having a stitch (suture) placed in your cervix to prevent your cervix from opening too early (cerclage).  Taking or being given medicines, such as: ? Hormone medicines. These may be given early in pregnancy to help support the pregnancy. ? Medicine to stop contractions. ? Medicines to help mature the baby's lungs. These may be prescribed if the risk of delivery is high. ? Medicines to prevent your baby from developing cerebral palsy. If the labor happens before 34 weeks of pregnancy, you may need to stay in the hospital. What should I do if I think I am in preterm labor? If you think that you are going into preterm labor, call your health care provider right away. How can I prevent preterm labor in future pregnancies? To increase your chance of having a  full-term pregnancy:  Do not use any tobacco products, such as cigarettes, chewing tobacco, and e-cigarettes. If you need help quitting, ask your health care provider.  Do not use street drugs or medicines that have not been prescribed to you during your pregnancy.  Talk with your health care provider before taking any herbal supplements, even if you have been taking them regularly.  Make sure you gain a healthy amount of weight during your pregnancy.  Watch for infection. If you think that you might have an infection, get it checked right away.  Make sure to tell your health care provider if you have gone into preterm labor before. This information is not intended to replace advice given to you by your health care provider. Make sure you discuss any questions you have with your health care provider. Document Released: 01/21/2004 Document Revised: 02/22/2019 Document Reviewed: 03/23/2016 Elsevier Patient Education  2020 [employer].

## 2019-07-26 ENCOUNTER — Encounter: Payer: Medicaid Other | Admitting: Obstetrics and Gynecology

## 2019-07-29 ENCOUNTER — Encounter: Payer: Self-pay | Admitting: Women's Health

## 2019-07-29 ENCOUNTER — Other Ambulatory Visit: Payer: Self-pay

## 2019-07-29 ENCOUNTER — Ambulatory Visit (INDEPENDENT_AMBULATORY_CARE_PROVIDER_SITE_OTHER): Payer: Medicaid Other | Admitting: Women's Health

## 2019-07-29 VITALS — BP 111/68 | HR 75 | Wt 165.0 lb

## 2019-07-29 DIAGNOSIS — A630 Anogenital (venereal) warts: Secondary | ICD-10-CM | POA: Diagnosis not present

## 2019-07-29 DIAGNOSIS — Z1389 Encounter for screening for other disorder: Secondary | ICD-10-CM

## 2019-07-29 DIAGNOSIS — Z331 Pregnant state, incidental: Secondary | ICD-10-CM

## 2019-07-29 DIAGNOSIS — Z3A23 23 weeks gestation of pregnancy: Secondary | ICD-10-CM

## 2019-07-29 DIAGNOSIS — O26892 Other specified pregnancy related conditions, second trimester: Secondary | ICD-10-CM

## 2019-07-29 LAB — POCT URINALYSIS DIPSTICK OB
Blood, UA: NEGATIVE
Glucose, UA: NEGATIVE
Ketones, UA: NEGATIVE
Leukocytes, UA: NEGATIVE
Nitrite, UA: NEGATIVE
POC,PROTEIN,UA: NEGATIVE

## 2019-07-29 NOTE — Progress Notes (Signed)
   GYN VISIT Patient name: Cristina Davenport MRN [id number]  Date of birth: 04-21-1986 Chief Complaint:   Routine Prenatal Visit (tca)  History of Present Illness:   Cristina Davenport is a 33 y.o. [phone] Caucasian female at [redacted]w[redacted]d being seen today for 3rd TCA tx of vulvar condyloma. Good fm. No complaints.      Patient's last menstrual period was 02/13/2019 (exact date). Review of Systems:   Pertinent items are noted in HPI Denies fever/chills, dizziness, headaches, visual disturbances, fatigue, shortness of breath, chest pain, abdominal pain, vomiting, abnormal vaginal discharge/itching/odor/irritation, problems with periods, bowel movements, urination, or intercourse unless otherwise stated above.  Pertinent History Reviewed:  Reviewed past medical,surgical, social, obstetrical and family history.  Reviewed problem list, medications and allergies. Physical Assessment:   Vitals:   07/29/19 1135  BP: 111/68  Pulse: 75  Weight: 165 lb (74.8 kg)  Body mass index is 31.18 kg/m.       Physical Examination:   General appearance: alert, well appearing, and in no distress  Mental status: alert, oriented to person, place, and time  Skin: warm & dry   Cardiovascular: normal heart rate noted  Respiratory: normal respiratory effort, no distress  Abdomen: soft, non-tender   Pelvic: TCA applied to remaining 2 condyloma (initially had 4)  Extremities: no edema   FH: 23cm FHT: 158  Results for orders placed or performed in visit on 07/29/19 (from the past 24 hour(s))  POC Urinalysis Dipstick OB   Collection Time: 07/29/19 11:40 AM  Result Value Ref Range   Color, UA     Clarity, UA     Glucose, UA Negative Negative   Bilirubin, UA     Ketones, UA neg    Spec Grav, UA     Blood, UA neg    pH, UA     POC,PROTEIN,UA Negative Negative, Trace, Small (1+), Moderate (2+), Large (3+), 4+   Urobilinogen, UA     Nitrite, UA neg    Leukocytes, UA Negative Negative   Appearance     Odor       Assessment & Plan:  1) [redacted]w[redacted]d pregnant  2) Vulvar condyloma> 3rd TCA tx today, 2 of 4 condyloma have gone away  Meds: No orders of the defined types were placed in this encounter.   Orders Placed This Encounter  Procedures  . POC Urinalysis Dipstick OB    Return in about 1 week (around 08/05/2019) for OB F/U TCA tx, in person.  [place], [facility] 07/29/2019 11:58 AM

## 2019-08-05 ENCOUNTER — Encounter: Payer: Self-pay | Admitting: Women's Health

## 2019-08-05 ENCOUNTER — Ambulatory Visit (INDEPENDENT_AMBULATORY_CARE_PROVIDER_SITE_OTHER): Payer: Medicaid Other | Admitting: Women's Health

## 2019-08-05 ENCOUNTER — Other Ambulatory Visit: Payer: Self-pay

## 2019-08-05 VITALS — BP 110/64 | HR 86 | Wt 170.2 lb

## 2019-08-05 DIAGNOSIS — Z331 Pregnant state, incidental: Secondary | ICD-10-CM

## 2019-08-05 DIAGNOSIS — Z3A24 24 weeks gestation of pregnancy: Secondary | ICD-10-CM

## 2019-08-05 DIAGNOSIS — Z1389 Encounter for screening for other disorder: Secondary | ICD-10-CM

## 2019-08-05 DIAGNOSIS — A630 Anogenital (venereal) warts: Secondary | ICD-10-CM | POA: Diagnosis not present

## 2019-08-05 DIAGNOSIS — O9989 Other specified diseases and conditions complicating pregnancy, childbirth and the puerperium: Secondary | ICD-10-CM

## 2019-08-05 LAB — POCT URINALYSIS DIPSTICK OB
Blood, UA: NEGATIVE
Glucose, UA: NEGATIVE
Ketones, UA: NEGATIVE
Leukocytes, UA: NEGATIVE
Nitrite, UA: NEGATIVE
POC,PROTEIN,UA: NEGATIVE

## 2019-08-05 NOTE — Progress Notes (Signed)
   GYN VISIT Patient name: Cristina Davenport MRN [id number]  Date of birth: 20-Jan-1986 Chief Complaint:   Routine Prenatal Visit (tca treatment)  History of Present Illness:   Cristina Davenport is a 33 y.o. [phone] Caucasian female at [redacted]w[redacted]d being seen today for 4th TCA tx of vulvar condyloma. Thinks another one fell off. Good fm, no complaints.      Patient's last menstrual period was 02/13/2019 (exact date). Review of Systems:   Pertinent items are noted in HPI Denies fever/chills, dizziness, headaches, visual disturbances, fatigue, shortness of breath, chest pain, abdominal pain, vomiting, abnormal vaginal discharge/itching/odor/irritation, problems with periods, bowel movements, urination, or intercourse unless otherwise stated above.  Pertinent History Reviewed:  Reviewed past medical,surgical, social, obstetrical and family history.  Reviewed problem list, medications and allergies. Physical Assessment:   Vitals:   08/05/19 1009  BP: 110/64  Pulse: 86  Weight: 170 lb 3.2 oz (77.2 kg)  Body mass index is 32.16 kg/m.       Physical Examination:   General appearance: alert, well appearing, and in no distress  Mental status: alert, oriented to person, place, and time  Skin: warm & dry   Cardiovascular: normal heart rate noted  Respiratory: normal respiratory effort, no distress  Abdomen: soft, non-tender   Pelvic: TCA applied to 1 remaining pedunculated condyloma and one are of flat condyloma where one fell off (initially had 4)  Extremities: no edema   FH: 24cm FHT: 145  Results for orders placed or performed in visit on 08/05/19 (from the past 24 hour(s))  POC Urinalysis Dipstick OB   Collection Time: 08/05/19 10:12 AM  Result Value Ref Range   Color, UA     Clarity, UA     Glucose, UA Negative Negative   Bilirubin, UA     Ketones, UA neg    Spec Grav, UA     Blood, UA neg    pH, UA     POC,PROTEIN,UA Negative Negative, Trace, Small (1+), Moderate (2+), Large (3+), 4+   Urobilinogen, UA     Nitrite, UA neg    Leukocytes, UA Negative Negative   Appearance     Odor      Assessment & Plan:  1) [redacted]w[redacted]d pregnant  2) Vulvar condyloma> 4th TCA tx today, 1 of 4 condyloma remaining, 1 new are of flat condyloma where one fell off  Meds: No orders of the defined types were placed in this encounter.   Orders Placed This Encounter  Procedures  . POC Urinalysis Dipstick OB    Return in about 1 week (around 08/12/2019) for F/U TCA tx.  [place], [facility] 08/05/2019 10:27 AM

## 2019-08-05 NOTE — Patient Instructions (Addendum)
Cristina Davenport, I greatly value your feedback.  If you receive a survey following your visit with [place] today, we appreciate you taking the time to fill it out.  Thanks, [name], CNM, [facility]    [place]!!! It is now [facility] ([place], [place] [address]) Entrance located off of [place] parking  Go to [email or web address] to register for FREE online childbirth classes   Call the office [phone]) or go to [facility] if:  You begin to have strong, frequent contractions  Your water breaks.  Sometimes it is a big gush of fluid, sometimes it is just a trickle that keeps getting your panties wet or running down your legs  You have vaginal bleeding.  It is normal to have a small amount of spotting if your cervix was checked.   You don't feel your baby moving like normal.  If you don't, get you something to eat and drink and lay down and focus on feeling your baby move.   If your baby is still not moving like normal, you should call the office or go to [place] Pediatricians/Family Doctors:  [place] [phone]                 [place] [phone] (usually not accepting new patients unless you have family there already, you are always welcome to call and ask)       [facility] Department [phone]       [facility] Pediatricians/Family Doctors:   [facility]: [phone]  [facility]: [phone]  [facility]: [place] Doctors:   [facility]: [facility]: [place]:  [place]: [phone]   Home Blood Pressure Monitoring for Patients   Your provider has recommended that you check your blood pressure (BP) at least  once a week at home. If you do not have a blood pressure cuff at home, one will be provided for you. Contact your provider if you have not received your monitor within 1 week.   Helpful Tips for Accurate Home Blood Pressure Checks  . Don't smoke, exercise, or drink caffeine 30 minutes before checking your BP . Use the restroom before checking your BP (a full bladder can raise your pressure) . Relax in a comfortable upright chair . Feet on the ground . Left arm resting comfortably on a flat surface at the level of your heart . Legs uncrossed . Back supported . Sit quietly and don't talk . Place the cuff on your bare arm . Adjust snuggly, so that only two fingertips can fit between your skin and the top of the cuff . Check 2 readings separated by at least one minute . Keep a log of your BP readings . For a visual, please reference this diagram: http://ccnc.care/bpdiagram  Provider Name: [facility]     Phone: [phone]  Zone 1: ALL CLEAR  Continue to monitor your symptoms:  . BP reading is less than 140 (top number) or less than 90 (bottom number)  . No right upper stomach pain . No headaches or seeing spots . No feeling nauseated or throwing up . No swelling in face and hands  Zone 2: CAUTION Call your doctor's office for any of the following:  .  BP reading is greater than 140 (top number) or greater than 90 (bottom number)  . Stomach pain under your ribs in the middle or right side . Headaches or seeing spots . Feeling nauseated or throwing up . Swelling in face and hands  Zone 3: EMERGENCY  Seek immediate medical care if you have any of the following:  . BP reading is greater than160 (top number) or greater than 110 (bottom number) . Severe headaches not improving with Tylenol . Serious difficulty catching your breath . Any worsening symptoms from Zone 2   Second Trimester of Pregnancy The second trimester is from week 13 through week 28, months 4 through 6. The  second trimester is often a time when you feel your best. Your body has also adjusted to being pregnant, and you begin to feel better physically. Usually, morning sickness has lessened or quit completely, you may have more energy, and you may have an increase in appetite. The second trimester is also a time when the fetus is growing rapidly. At the end of the sixth month, the fetus is about 9 inches long and weighs about 1 pounds. You will likely begin to feel the baby move (quickening) between 18 and 20 weeks of the pregnancy. BODY CHANGES Your body goes through many changes during pregnancy. The changes vary from woman to woman.   Your weight will continue to increase. You will notice your lower abdomen bulging out.  You may begin to get stretch marks on your hips, abdomen, and breasts.  You may develop headaches that can be relieved by medicines approved by your health care provider.  You may urinate more often because the fetus is pressing on your bladder.  You may develop or continue to have heartburn as a result of your pregnancy.  You may develop constipation because certain hormones are causing the muscles that push waste through your intestines to slow down.  You may develop hemorrhoids or swollen, bulging veins (varicose veins).  You may have back pain because of the weight gain and pregnancy hormones relaxing your joints between the bones in your pelvis and as a result of a shift in weight and the muscles that support your balance.  Your breasts will continue to grow and be tender.  Your gums may bleed and may be sensitive to brushing and flossing.  Dark spots or blotches (chloasma, mask of pregnancy) may develop on your face. This will likely fade after the baby is born.  A dark line from your belly button to the pubic area (linea nigra) may appear. This will likely fade after the baby is born.  You may have changes in your hair. These can include thickening of your hair,  rapid growth, and changes in texture. Some women also have hair loss during or after pregnancy, or hair that feels dry or thin. Your hair will most likely return to normal after your baby is born. WHAT TO EXPECT AT YOUR PRENATAL VISITS During a routine prenatal visit:  You will be weighed to make sure you and the fetus are growing normally.  Your blood pressure will be taken.  Your abdomen will be measured to track your baby's growth.  The fetal heartbeat will be listened to.  Any test results from the previous visit will be discussed. Your health care provider may ask you:  How you are feeling.  If you are feeling the baby move.  If you have had any abnormal symptoms, such as leaking fluid, bleeding, severe headaches,  or abdominal cramping.  If you have any questions. Other tests that may be performed during your second trimester include:  Blood tests that check for:  Low iron levels (anemia).  Gestational diabetes (between 24 and 28 weeks).  Rh antibodies.  Urine tests to check for infections, diabetes, or protein in the urine.  An ultrasound to confirm the proper growth and development of the baby.  An amniocentesis to check for possible genetic problems.  Fetal screens for spina bifida and Down syndrome. HOME CARE INSTRUCTIONS   Avoid all smoking, herbs, alcohol, and unprescribed drugs. These chemicals affect the formation and growth of the baby.  Follow your health care provider's instructions regarding medicine use. There are medicines that are either safe or unsafe to take during pregnancy.  Exercise only as directed by your health care provider. Experiencing uterine cramps is a good sign to stop exercising.  Continue to eat regular, healthy meals.  Wear a good support bra for breast tenderness.  Do not use hot tubs, steam rooms, or saunas.  Wear your seat belt at all times when driving.  Avoid raw meat, uncooked cheese, cat litter boxes, and soil used by  cats. These carry germs that can cause birth defects in the baby.  Take your prenatal vitamins.  Try taking a stool softener (if your health care provider approves) if you develop constipation. Eat more high-fiber foods, such as fresh vegetables or fruit and whole grains. Drink plenty of fluids to keep your urine clear or pale yellow.  Take warm sitz baths to soothe any pain or discomfort caused by hemorrhoids. Use hemorrhoid cream if your health care provider approves.  If you develop varicose veins, wear support hose. Elevate your feet for 15 minutes, 3-4 times a day. Limit salt in your diet.  Avoid heavy lifting, wear low heel shoes, and practice good posture.  Rest with your legs elevated if you have leg cramps or low back pain.  Visit your dentist if you have not gone yet during your pregnancy. Use a soft toothbrush to brush your teeth and be gentle when you floss.  A sexual relationship may be continued unless your health care provider directs you otherwise.  Continue to go to all your prenatal visits as directed by your health care provider. SEEK MEDICAL CARE IF:   You have dizziness.  You have mild pelvic cramps, pelvic pressure, or nagging pain in the abdominal area.  You have persistent nausea, vomiting, or diarrhea.  You have a bad smelling vaginal discharge.  You have pain with urination. SEEK IMMEDIATE MEDICAL CARE IF:   You have a fever.  You are leaking fluid from your vagina.  You have spotting or bleeding from your vagina.  You have severe abdominal cramping or pain.  You have rapid weight gain or loss.  You have shortness of breath with chest pain.  You notice sudden or extreme swelling of your face, hands, ankles, feet, or legs.  You have not felt your baby move in over an hour.  You have severe headaches that do not go away with medicine.  You have vision changes. Document Released: 10/25/2001 Document Revised: 11/05/2013 Document Reviewed:  01/01/2013 [facility] Patient Information 2015 [place], [place]. This information is not intended to replace advice given to you by your health care provider. Make sure you discuss any questions you have with your health care provider.

## 2019-08-12 ENCOUNTER — Other Ambulatory Visit: Payer: Self-pay | Admitting: Women's Health

## 2019-08-12 DIAGNOSIS — Z3483 Encounter for supervision of other normal pregnancy, third trimester: Secondary | ICD-10-CM

## 2019-08-12 DIAGNOSIS — O26843 Uterine size-date discrepancy, third trimester: Secondary | ICD-10-CM

## 2019-08-13 ENCOUNTER — Encounter: Payer: Medicaid Other | Admitting: Women's Health

## 2019-08-28 ENCOUNTER — Encounter: Payer: Self-pay | Admitting: Women's Health

## 2019-08-28 ENCOUNTER — Other Ambulatory Visit: Payer: 59

## 2019-08-28 ENCOUNTER — Ambulatory Visit (INDEPENDENT_AMBULATORY_CARE_PROVIDER_SITE_OTHER): Payer: 59

## 2019-08-28 ENCOUNTER — Other Ambulatory Visit: Payer: Self-pay

## 2019-08-28 ENCOUNTER — Ambulatory Visit (INDEPENDENT_AMBULATORY_CARE_PROVIDER_SITE_OTHER): Payer: 59 | Admitting: Women's Health

## 2019-08-28 VITALS — BP 114/69 | HR 77 | Wt 174.0 lb

## 2019-08-28 DIAGNOSIS — Z23 Encounter for immunization: Secondary | ICD-10-CM

## 2019-08-28 DIAGNOSIS — Z3A28 28 weeks gestation of pregnancy: Secondary | ICD-10-CM

## 2019-08-28 DIAGNOSIS — Z3483 Encounter for supervision of other normal pregnancy, third trimester: Secondary | ICD-10-CM

## 2019-08-28 DIAGNOSIS — Z3482 Encounter for supervision of other normal pregnancy, second trimester: Secondary | ICD-10-CM

## 2019-08-28 DIAGNOSIS — O26843 Uterine size-date discrepancy, third trimester: Secondary | ICD-10-CM | POA: Diagnosis not present

## 2019-08-28 DIAGNOSIS — Z331 Pregnant state, incidental: Secondary | ICD-10-CM

## 2019-08-28 DIAGNOSIS — Z1389 Encounter for screening for other disorder: Secondary | ICD-10-CM

## 2019-08-28 DIAGNOSIS — Z3A27 27 weeks gestation of pregnancy: Secondary | ICD-10-CM

## 2019-08-28 DIAGNOSIS — Z131 Encounter for screening for diabetes mellitus: Secondary | ICD-10-CM

## 2019-08-28 LAB — POCT URINALYSIS DIPSTICK OB
Blood, UA: NEGATIVE
Glucose, UA: NEGATIVE
Ketones, UA: NEGATIVE
Leukocytes, UA: NEGATIVE
Nitrite, UA: NEGATIVE
POC,PROTEIN,UA: NEGATIVE

## 2019-08-28 NOTE — Progress Notes (Signed)
[place] 28 wks,cephalic,anterior placenta gr 3,normal ovaries bilat,fhr 138 bpm,afi 16.6 cm,cx 3.7 cm,EFW 1036 g 14%,FL .2%

## 2019-08-28 NOTE — Patient Instructions (Signed)
Cristina Davenport, I greatly value your feedback.  If you receive a survey following your visit with [place] today, we appreciate you taking the time to fill it out.  Thanks, [name], CNM, [facility]  [place] [facility]!!! It is now [place] at [facility] ([place], [place] [address]) Entrance located off of [address] parking    Go to [email or web address] to register for FREE online childbirth classes   Call the office [phone]) or go to [facility] if:  You begin to have strong, frequent contractions  Your water breaks.  Sometimes it is a big gush of fluid, sometimes it is just a trickle that keeps getting your panties wet or running down your legs  You have vaginal bleeding.  It is normal to have a small amount of spotting if your cervix was checked.   You don't feel your baby moving like normal.  If you don't, get you something to eat and drink and lay down and focus on feeling your baby move.  You should feel at least 10 movements in 2 hours.  If you don't, you should call the office or go to [facility].    Tdap Vaccine  It is recommended that you get the Tdap vaccine during the third trimester of EACH pregnancy to help protect your baby from getting pertussis (whooping cough)  27-36 weeks is the BEST time to do this so that you can pass the protection on to your baby. During pregnancy is better than after pregnancy, but if you are unable to get it during pregnancy it will be offered at the hospital.   You can get this vaccine with [place], at the health department, your family doctor, or some local pharmacies  Everyone who will be around your baby should also be up-to-date on their vaccines before the baby comes. Adults (who are not pregnant) only need 1 dose of Tdap during adulthood.   [place] Pediatricians/Family Doctors:  [facility] [phone]                  [facility] [phone] (usually not accepting new patients unless you have family there already, you are always welcome to call and ask)       [facility] Department [phone]       [facility] Pediatricians/Family Doctors:   [facility]: [phone]  [facility]: [phone]  [facility]: [place] Doctors:   [facility]: [facility]: [place]:  [place]: [phone]   Home Blood Pressure Monitoring for Patients   Your provider has recommended that you check your blood pressure (BP) at least once a week at home. If you do not have a blood pressure cuff at home, one will be provided for you. Contact your provider if you have not received your monitor within 1 week.   Helpful Tips for Accurate Home Blood Pressure Checks  . Don't smoke, exercise, or drink caffeine 30 minutes before checking your BP . Use the restroom before checking your BP (a full bladder can raise your pressure) . Relax in a comfortable upright chair . Feet on the ground . Left arm resting comfortably on a flat surface at the level of your heart . Legs uncrossed . Back supported . Sit quietly and don't  talk . Place the cuff on your bare arm . Adjust snuggly, so that only two fingertips can fit between your skin and the top of the cuff . Check 2 readings separated by at least one minute . Keep a log of your BP readings . For a visual, please reference this diagram: http://ccnc.care/bpdiagram  Provider Name: [facility]     Phone: [phone]  Zone 1: ALL CLEAR  Continue to monitor your symptoms:  . BP reading is less than 140 (top number) or less than 90 (bottom number)  . No right upper stomach pain . No headaches or seeing spots . No feeling nauseated or throwing up . No swelling in face and  hands  Zone 2: CAUTION Call your doctor's office for any of the following:  . BP reading is greater than 140 (top number) or greater than 90 (bottom number)  . Stomach pain under your ribs in the middle or right side . Headaches or seeing spots . Feeling nauseated or throwing up . Swelling in face and hands  Zone 3: EMERGENCY  Seek immediate medical care if you have any of the following:  . BP reading is greater than160 (top number) or greater than 110 (bottom number) . Severe headaches not improving with Tylenol . Serious difficulty catching your breath . Any worsening symptoms from Zone 2   Third Trimester of Pregnancy The third trimester is from week 29 through week 42, months 7 through 9. The third trimester is a time when the fetus is growing rapidly. At the end of the ninth month, the fetus is about 20 inches in length and weighs 6-10 pounds.  BODY CHANGES Your body goes through many changes during pregnancy. The changes vary from woman to woman.   Your weight will continue to increase. You can expect to gain 25-35 pounds (11-16 kg) by the end of the pregnancy.  You may begin to get stretch marks on your hips, abdomen, and breasts.  You may urinate more often because the fetus is moving lower into your pelvis and pressing on your bladder.  You may develop or continue to have heartburn as a result of your pregnancy.  You may develop constipation because certain hormones are causing the muscles that push waste through your intestines to slow down.  You may develop hemorrhoids or swollen, bulging veins (varicose veins).  You may have pelvic pain because of the weight gain and pregnancy hormones relaxing your joints between the bones in your pelvis. Backaches may result from overexertion of the muscles supporting your posture.  You may have changes in your hair. These can include thickening of your hair, rapid growth, and changes in texture. Some women also have hair loss during  or after pregnancy, or hair that feels dry or thin. Your hair will most likely return to normal after your baby is born.  Your breasts will continue to grow and be tender. A yellow discharge may leak from your breasts called colostrum.  Your belly button may stick out.  You may feel short of breath because of your expanding uterus.  You may notice the fetus "dropping," or moving lower in your abdomen.  You may have a bloody mucus discharge. This usually occurs a few days to a week before labor begins.  Your cervix becomes thin and soft (effaced) near your due date. WHAT TO EXPECT AT YOUR PRENATAL EXAMS  You will have prenatal exams every 2 weeks until week 36. Then, you will have weekly prenatal  exams. During a routine prenatal visit:  You will be weighed to make sure you and the fetus are growing normally.  Your blood pressure is taken.  Your abdomen will be measured to track your baby's growth.  The fetal heartbeat will be listened to.  Any test results from the previous visit will be discussed.  You may have a cervical check near your due date to see if you have effaced. At around 36 weeks, your caregiver will check your cervix. At the same time, your caregiver will also perform a test on the secretions of the vaginal tissue. This test is to determine if a type of bacteria, Group B streptococcus, is present. Your caregiver will explain this further. Your caregiver may ask you:  What your birth plan is.  How you are feeling.  If you are feeling the baby move.  If you have had any abnormal symptoms, such as leaking fluid, bleeding, severe headaches, or abdominal cramping.  If you have any questions. Other tests or screenings that may be performed during your third trimester include:  Blood tests that check for low iron levels (anemia).  Fetal testing to check the health, activity level, and growth of the fetus. Testing is done if you have certain medical conditions or if  there are problems during the pregnancy. FALSE LABOR You may feel small, irregular contractions that eventually go away. These are called Braxton Hicks contractions, or false labor. Contractions may last for hours, days, or even weeks before true labor sets in. If contractions come at regular intervals, intensify, or become painful, it is best to be seen by your caregiver.  SIGNS OF LABOR   Menstrual-like cramps.  Contractions that are 5 minutes apart or less.  Contractions that start on the top of the uterus and spread down to the lower abdomen and back.  A sense of increased pelvic pressure or back pain.  A watery or bloody mucus discharge that comes from the vagina. If you have any of these signs before the 37th week of pregnancy, call your caregiver right away. You need to go to the hospital to get checked immediately. HOME CARE INSTRUCTIONS   Avoid all smoking, herbs, alcohol, and unprescribed drugs. These chemicals affect the formation and growth of the baby.  Follow your caregiver's instructions regarding medicine use. There are medicines that are either safe or unsafe to take during pregnancy.  Exercise only as directed by your caregiver. Experiencing uterine cramps is a good sign to stop exercising.  Continue to eat regular, healthy meals.  Wear a good support bra for breast tenderness.  Do not use hot tubs, steam rooms, or saunas.  Wear your seat belt at all times when driving.  Avoid raw meat, uncooked cheese, cat litter boxes, and soil used by cats. These carry germs that can cause birth defects in the baby.  Take your prenatal vitamins.  Try taking a stool softener (if your caregiver approves) if you develop constipation. Eat more high-fiber foods, such as fresh vegetables or fruit and whole grains. Drink plenty of fluids to keep your urine clear or pale yellow.  Take warm sitz baths to soothe any pain or discomfort caused by hemorrhoids. Use hemorrhoid cream if your  caregiver approves.  If you develop varicose veins, wear support hose. Elevate your feet for 15 minutes, 3-4 times a day. Limit salt in your diet.  Avoid heavy lifting, wear low heal shoes, and practice good posture.  Rest a lot with your legs elevated  if you have leg cramps or low back pain.  Visit your dentist if you have not gone during your pregnancy. Use a soft toothbrush to brush your teeth and be gentle when you floss.  A sexual relationship may be continued unless your caregiver directs you otherwise.  Do not travel far distances unless it is absolutely necessary and only with the approval of your caregiver.  Take prenatal classes to understand, practice, and ask questions about the labor and delivery.  Make a trial run to the hospital.  Pack your hospital bag.  Prepare the baby's nursery.  Continue to go to all your prenatal visits as directed by your caregiver. SEEK MEDICAL CARE IF:  You are unsure if you are in labor or if your water has broken.  You have dizziness.  You have mild pelvic cramps, pelvic pressure, or nagging pain in your abdominal area.  You have persistent nausea, vomiting, or diarrhea.  You have a bad smelling vaginal discharge.  You have pain with urination. SEEK IMMEDIATE MEDICAL CARE IF:   You have a fever.  You are leaking fluid from your vagina.  You have spotting or bleeding from your vagina.  You have severe abdominal cramping or pain.  You have rapid weight loss or gain.  You have shortness of breath with chest pain.  You notice sudden or extreme swelling of your face, hands, ankles, feet, or legs.  You have not felt your baby move in over an hour.  You have severe headaches that do not go away with medicine.  You have vision changes. Document Released: 10/25/2001 Document Revised: 11/05/2013 Document Reviewed: 01/01/2013 [facility] Patient Information 2015 [place], [place]. This information is not intended to replace advice  given to you by your health care provider. Make sure you discuss any questions you have with your health care provider.

## 2019-08-28 NOTE — Progress Notes (Signed)
   LOW-RISK PREGNANCY VISIT Patient name: Cristina Davenport MRN [id number]  Date of birth: 10/15/1986 Chief Complaint:   Routine Prenatal Visit  History of Present Illness:   Cristina Davenport is a 33 y.o. [id number] female at [redacted]w[redacted]d with an Estimated Date of Delivery: 11/20/19 being seen today for ongoing management of a low-risk pregnancy.  Today she reports no complaints. Contractions: Irregular. Vag. Bleeding: None.  Movement: Present. denies leaking of fluid. Review of Systems:   Pertinent items are noted in HPI Denies abnormal vaginal discharge w/ itching/odor/irritation, headaches, visual changes, shortness of breath, chest pain, abdominal pain, severe nausea/vomiting, or problems with urination or bowel movements unless otherwise stated above. Pertinent History Reviewed:  Reviewed past medical,surgical, social, obstetrical and family history.  Reviewed problem list, medications and allergies. Physical Assessment:   Vitals:   08/28/19 0930  BP: 114/69  Pulse: 77  Weight: 174 lb (78.9 kg)  Body mass index is 32.88 kg/m.        Physical Examination:   General appearance: Well appearing, and in no distress  Mental status: Alert, oriented to person, place, and time  Skin: Warm & dry  Cardiovascular: Normal heart rate noted  Respiratory: Normal respiratory effort, no distress  Abdomen: Soft, gravid, nontender  Pelvic: Cervical exam deferred         Extremities: Edema: Trace  Fetal Status: Fetal Heart Rate (bpm): 138 u/s   Movement: Present    [place] 28 wks,cephalic,anterior placenta gr 3,normal ovaries bilat,fhr 138 bpm,afi 16.6 cm,cx 3.7 cm,EFW 1036 g 14%,FL .2%  Results for orders placed or performed in visit on 08/28/19 (from the past 24 hour(s))  POC Urinalysis Dipstick OB   Collection Time: 08/28/19  9:34 AM  Result Value Ref Range   Color, UA     Clarity, UA     Glucose, UA Negative Negative   Bilirubin, UA     Ketones, UA n    Spec Grav, UA     Blood, UA n    pH, UA     POC,PROTEIN,UA Negative Negative, Trace, Small (1+), Moderate (2+), Large (3+), 4+   Urobilinogen, UA     Nitrite, UA n    Leukocytes, UA Negative Negative   Appearance     Odor      Assessment & Plan:  1) Low-risk pregnancy [id number] at [redacted]w[redacted]d with an Estimated Date of Delivery: 11/20/19   2) H/O pre-e, continue ASA   Meds: No orders of the defined types were placed in this encounter.  Labs/procedures today: pn2, tdap, flu shot, efw u/s (normal w/ short long bones)  Plan:  Continue routine obstetrical care   Reviewed: Preterm labor symptoms and general obstetric precautions including but not limited to vaginal bleeding, contractions, leaking of fluid and fetal movement were reviewed in detail with the patient.  All questions were answered. Has home bp cuff. Check bp weekly, let us know if >140/90.   Follow-up: Return in about 4 weeks (around 09/25/2019) for [place], [place], CNM.  Orders Placed This Encounter  Procedures  . Flu Vaccine QUAD 36+ mos IM  . Tdap vaccine greater than or equal to 7yo IM  . POC Urinalysis Dipstick OB   Roma Schanz CNM, [facility] 08/28/2019 11:30 AM

## 2019-08-29 LAB — CBC
Hematocrit: 37.8 % (ref 34.0–46.6)
Hemoglobin: 12.4 g/dL (ref 11.1–15.9)
MCH: 29.2 pg (ref 26.6–33.0)
MCHC: 32.8 g/dL (ref 31.5–35.7)
MCV: 89 fL (ref 79–97)
Platelets: 226 10*3/uL (ref 150–450)
RBC: 4.25 x10E6/uL (ref 3.77–5.28)
RDW: 12.4 % (ref 11.7–15.4)
WBC: 8.7 10*3/uL (ref 3.4–10.8)

## 2019-08-29 LAB — HIV ANTIBODY (ROUTINE TESTING W REFLEX): HIV Screen 4th Generation wRfx: NONREACTIVE

## 2019-08-29 LAB — GLUCOSE TOLERANCE, 2 HOURS W/ 1HR
Glucose, 1 hour: 136 mg/dL (ref 65–179)
Glucose, 2 hour: 84 mg/dL (ref 65–152)
Glucose, Fasting: 64 mg/dL — ABNORMAL LOW (ref 65–91)

## 2019-08-29 LAB — RPR: RPR Ser Ql: NONREACTIVE

## 2019-08-29 LAB — ANTIBODY SCREEN: Antibody Screen: NEGATIVE

## 2019-08-30 ENCOUNTER — Encounter: Payer: Self-pay | Admitting: Obstetrics and Gynecology

## 2019-08-30 DIAGNOSIS — O35HXX0 Maternal care for other (suspected) fetal abnormality and damage, fetal lower extremities anomalies, not applicable or unspecified: Secondary | ICD-10-CM | POA: Insufficient documentation

## 2019-08-30 DIAGNOSIS — O358XX0 Maternal care for other (suspected) fetal abnormality and damage, not applicable or unspecified: Secondary | ICD-10-CM | POA: Insufficient documentation

## 2019-09-04 ENCOUNTER — Ambulatory Visit (INDEPENDENT_AMBULATORY_CARE_PROVIDER_SITE_OTHER): Payer: 59 | Admitting: Advanced Practice Midwife

## 2019-09-04 ENCOUNTER — Other Ambulatory Visit: Payer: Self-pay

## 2019-09-04 VITALS — BP 118/72 | HR 89 | Wt 176.0 lb

## 2019-09-04 DIAGNOSIS — Z3A29 29 weeks gestation of pregnancy: Secondary | ICD-10-CM

## 2019-09-04 DIAGNOSIS — Z1389 Encounter for screening for other disorder: Secondary | ICD-10-CM

## 2019-09-04 DIAGNOSIS — Z331 Pregnant state, incidental: Secondary | ICD-10-CM

## 2019-09-04 DIAGNOSIS — Z3483 Encounter for supervision of other normal pregnancy, third trimester: Secondary | ICD-10-CM

## 2019-09-04 LAB — POCT URINALYSIS DIPSTICK OB
Blood, UA: NEGATIVE
Glucose, UA: NEGATIVE
Ketones, UA: NEGATIVE
Leukocytes, UA: NEGATIVE
Nitrite, UA: NEGATIVE
POC,PROTEIN,UA: NEGATIVE

## 2019-09-04 NOTE — Progress Notes (Signed)
   LOW-RISK PREGNANCY VISIT Patient name: Cristina Davenport MRN [id number]  Date of birth: [birth year] Chief Complaint:   Routine Prenatal Visit  History of Present Illness:   Cristina Davenport is a 33 y.o. [id number] female at [redacted]w[redacted]d with an Estimated Date of Delivery: 11/20/19 being seen today for ongoing management of a low-risk pregnancy.  Today she reports some low abd pain yesterday afternoon, H/A, increased edema of legs, and home BP of 112/96; feels much better today but wanted to have a visit to be sure. Contractions: Irritability. Vag. Bleeding: None.  Movement: Present. denies leaking of fluid. Review of Systems:   Pertinent items are noted in HPI Denies abnormal vaginal discharge w/ itching/odor/irritation, headaches, visual changes, shortness of breath, chest pain, abdominal pain, severe nausea/vomiting, or problems with urination or bowel movements unless otherwise stated above. Pertinent History Reviewed:  Reviewed past medical,surgical, social, obstetrical and family history.  Reviewed problem list, medications and allergies. Physical Assessment:   Vitals:   09/04/19 0847  BP: 118/72  Pulse: 89  Weight: 176 lb (79.8 kg)  Body mass index is 33.25 kg/m.        Physical Examination:   General appearance: Well appearing, and in no distress  Mental status: Alert, oriented to person, place, and time  Skin: Warm & dry  Cardiovascular: Normal heart rate noted  Respiratory: Normal respiratory effort, no distress  Abdomen: Soft, gravid, nontender  Pelvic: Cervical exam deferred         Extremities: Edema: Trace  Fetal Status: Fetal Heart Rate (bpm): 132 Fundal Height: 28 cm Movement: Present    Results for orders placed or performed in visit on 09/04/19 (from the past 24 hour(s))  POC Urinalysis Dipstick OB   Collection Time: 09/04/19  8:48 AM  Result Value Ref Range   Color, UA     Clarity, UA     Glucose, UA Negative Negative   Bilirubin, UA     Ketones, UA neg    Spec Grav, UA      Blood, UA neg    pH, UA     POC,PROTEIN,UA Negative Negative, Trace, Small (1+), Moderate (2+), Large (3+), 4+   Urobilinogen, UA     Nitrite, UA neg    Leukocytes, UA Negative Negative   Appearance     Odor      Assessment & Plan:  1) Low-risk pregnancy [id number] at [redacted]w[redacted]d with an Estimated Date of Delivery: 11/20/19   2) Hx of pre-e, continue ASA   Meds: No orders of the defined types were placed in this encounter.  Labs/procedures today: none  Plan:  Continue routine obstetrical care with growing U/S at next visit to f/u on short long bones and growth  Reviewed: Preterm labor symptoms and general obstetric precautions including but not limited to vaginal bleeding, contractions, leaking of fluid and fetal movement were reviewed in detail with the patient.  All questions were answered. Has home bp cuff. Check bp weekly, let us know if >140/90.   Follow-up: Return for keep next visit/us as scheduled.  Orders Placed This Encounter  Procedures  . POC Urinalysis Dipstick OB   Myrtis Ser [facility] 09/04/2019 9:09 AM

## 2019-09-23 ENCOUNTER — Other Ambulatory Visit: Payer: Self-pay | Admitting: Women's Health

## 2019-09-23 ENCOUNTER — Other Ambulatory Visit: Payer: Self-pay | Admitting: *Deleted

## 2019-09-23 MED ORDER — PROMETHAZINE HCL 25 MG PO TABS: 25.0000 mg | ORAL_TABLET | Freq: Every day | ORAL | 2 refills | Status: DC

## 2019-09-23 MED ORDER — PRENATAL PLUS 27-1 MG PO TABS: 1.0000 | ORAL_TABLET | Freq: Every day | ORAL | 11 refills | Status: DC

## 2019-09-24 ENCOUNTER — Other Ambulatory Visit: Payer: Self-pay | Admitting: Advanced Practice Midwife

## 2019-09-24 DIAGNOSIS — O365930 Maternal care for other known or suspected poor fetal growth, third trimester, not applicable or unspecified: Secondary | ICD-10-CM

## 2019-09-25 ENCOUNTER — Ambulatory Visit (INDEPENDENT_AMBULATORY_CARE_PROVIDER_SITE_OTHER): Payer: 59

## 2019-09-25 ENCOUNTER — Telehealth: Payer: 59 | Admitting: Advanced Practice Midwife

## 2019-09-25 ENCOUNTER — Other Ambulatory Visit: Payer: Self-pay

## 2019-09-25 ENCOUNTER — Encounter: Payer: Self-pay | Admitting: Obstetrics and Gynecology

## 2019-09-25 ENCOUNTER — Ambulatory Visit (INDEPENDENT_AMBULATORY_CARE_PROVIDER_SITE_OTHER): Payer: 59 | Admitting: Obstetrics and Gynecology

## 2019-09-25 VITALS — BP 113/69 | HR 81 | Wt 182.0 lb

## 2019-09-25 DIAGNOSIS — Z1389 Encounter for screening for other disorder: Secondary | ICD-10-CM

## 2019-09-25 DIAGNOSIS — Z331 Pregnant state, incidental: Secondary | ICD-10-CM

## 2019-09-25 DIAGNOSIS — Z3A32 32 weeks gestation of pregnancy: Secondary | ICD-10-CM

## 2019-09-25 DIAGNOSIS — Z3403 Encounter for supervision of normal first pregnancy, third trimester: Secondary | ICD-10-CM

## 2019-09-25 DIAGNOSIS — O35HXX0 Maternal care for other (suspected) fetal abnormality and damage, fetal lower extremities anomalies, not applicable or unspecified: Secondary | ICD-10-CM

## 2019-09-25 DIAGNOSIS — O358XX0 Maternal care for other (suspected) fetal abnormality and damage, not applicable or unspecified: Secondary | ICD-10-CM

## 2019-09-25 DIAGNOSIS — Z362 Encounter for other antenatal screening follow-up: Secondary | ICD-10-CM

## 2019-09-25 DIAGNOSIS — O365930 Maternal care for other known or suspected poor fetal growth, third trimester, not applicable or unspecified: Secondary | ICD-10-CM

## 2019-09-25 LAB — POCT URINALYSIS DIPSTICK OB
Blood, UA: NEGATIVE
Glucose, UA: NEGATIVE
Ketones, UA: NEGATIVE
Leukocytes, UA: NEGATIVE
Nitrite, UA: NEGATIVE
POC,PROTEIN,UA: NEGATIVE

## 2019-09-25 NOTE — Progress Notes (Signed)
[place] 32 wks,transverse head right,cx 3.3 cm,anterior placenta gr 3,afi 22 cm,fhr 140 bpm,efw 1611g 9.3% AC 38%,FL .01%,HL 3.3%

## 2019-09-25 NOTE — Progress Notes (Signed)
Patient ID: NOLITA FACEY, female   DOB: [birth year], 33 y.o.   MRN: [id number]    LOW-RISK PREGNANCY VISIT Patient name: Cristina Davenport MRN [id number]  Date of birth: 1986/03/29 Chief Complaint:   Routine Prenatal Visit (U/S)  History of Present Illness:   Cristina Davenport is a 33 y.o. [id number] female at [redacted]w[redacted]d with an Estimated Date of Delivery: 11/20/19 being seen today for ongoing management of a low-risk pregnancy. Hx of pre-e in first pregnancy.  Today she reports no complaints. Contractions: Irritability. Vag. Bleeding: None.  Movement: Present. denies leaking of fluid. Review of Systems:   Pertinent items are noted in HPI Denies abnormal vaginal discharge w/ itching/odor/irritation, headaches, visual changes, shortness of breath, chest pain, abdominal pain, severe nausea/vomiting, or problems with urination or bowel movements unless otherwise stated above. Pertinent History Reviewed:  Reviewed past medical,surgical, social, obstetrical and family history.  Reviewed problem list, medications and allergies. Physical Assessment:   Vitals:   09/25/19 0959  BP: 113/69  Pulse: 81  Weight: 182 lb (82.6 kg)  Body mass index is 34.39 kg/m.        Physical Examination:   General appearance: Well appearing, and in no distress  Mental status: Alert, oriented to person, place, and time  Skin: Warm & dry  Cardiovascular: Normal heart rate noted  Respiratory: Normal respiratory effort, no distress  Abdomen: Soft, gravid, nontender  Pelvic: Cervical exam deferred         Extremities: Edema: Trace  Fetal Status: Fetal Heart Rate (bpm): 140 u/s Fundal Height: 36 cm Movement: Present    Chaperone: Samul Dada    Results for orders placed or performed in visit on 09/25/19 (from the past 24 hour(s))  POC Urinalysis Dipstick OB   Collection Time: 09/25/19 10:00 AM  Result Value Ref Range   Color, UA     Clarity, UA     Glucose, UA Negative Negative   Bilirubin, UA     Ketones, UA neg    Spec Grav, UA     Blood, UA neg    pH, UA     POC,PROTEIN,UA Negative Negative, Trace, Small (1+), Moderate (2+), Large (3+), 4+   Urobilinogen, UA     Nitrite, UA neg    Leukocytes, UA Negative Negative   Appearance     Odor      Assessment & Plan:  1) Low-risk pregnancy [id number] at [redacted]w[redacted]d with an Estimated Date of Delivery: 11/20/19   2) Symmetrical short limbed, stable   Meds: No orders of the defined types were placed in this encounter.  Labs/procedures today: [place] 32 wks,transverse head right,cx 3.3 cm,anterior placenta gr 3,afi 22 cm,fhr 140 bpm,efw 1611g 9.3% AC 38%,FL .01%,HL 3.3%  Plan:  Continue routine obstetrical care Discuss with MFM: Dr Donalee Citrin  Since [facility] is greater that 4 wks descrepancy, will have MFM do the 36 wk u/s for growth. There are NO identified frontal bossing or bowing of limbs.   Next visit: will be [facility] for [facility]     Will repeat u/s at 36 wk for growth thru MFM. Reviewed: Preterm labor symptoms and general obstetric precautions including but not limited to vaginal bleeding, contractions, leaking of fluid and fetal movement were reviewed in detail with the patient.    Follow-up: Return in about 2 weeks (around 10/09/2019).  Orders Placed This Encounter  Procedures  . POC Urinalysis Dipstick OB   By signing my name below, I, Samul Dada, attest that this documentation has been  prepared under the direction and in the presence of Jonnie Kind, MD. Electronically Signed: [place]. 09/25/19. 10:44 AM.  I personally performed the services described in this documentation, which was SCRIBED in my presence. The recorded information has been reviewed and considered accurate. It has been edited as necessary during review. Jonnie Kind, MD

## 2019-09-26 ENCOUNTER — Telehealth: Payer: Self-pay | Admitting: Obstetrics and Gynecology

## 2019-09-26 ENCOUNTER — Encounter: Payer: 59 | Admitting: Women's Health

## 2019-09-26 NOTE — Telephone Encounter (Signed)
Made aware of plans for 36 wk u/s thru [place].

## 2019-09-30 ENCOUNTER — Ambulatory Visit (INDEPENDENT_AMBULATORY_CARE_PROVIDER_SITE_OTHER): Payer: 59 | Admitting: Women's Health

## 2019-09-30 ENCOUNTER — Encounter: Payer: Self-pay | Admitting: Women's Health

## 2019-09-30 ENCOUNTER — Other Ambulatory Visit: Payer: Self-pay

## 2019-09-30 VITALS — BP 117/70 | HR 78 | Wt 182.0 lb

## 2019-09-30 DIAGNOSIS — O99891 Other specified diseases and conditions complicating pregnancy: Secondary | ICD-10-CM | POA: Diagnosis not present

## 2019-09-30 DIAGNOSIS — A630 Anogenital (venereal) warts: Secondary | ICD-10-CM | POA: Diagnosis not present

## 2019-09-30 DIAGNOSIS — Z1389 Encounter for screening for other disorder: Secondary | ICD-10-CM

## 2019-09-30 DIAGNOSIS — Z331 Pregnant state, incidental: Secondary | ICD-10-CM

## 2019-09-30 DIAGNOSIS — Z3483 Encounter for supervision of other normal pregnancy, third trimester: Secondary | ICD-10-CM

## 2019-09-30 DIAGNOSIS — Z3A32 32 weeks gestation of pregnancy: Secondary | ICD-10-CM

## 2019-09-30 LAB — POCT URINALYSIS DIPSTICK OB
Glucose, UA: NEGATIVE
Ketones, UA: NEGATIVE
Leukocytes, UA: NEGATIVE
Nitrite, UA: NEGATIVE
POC,PROTEIN,UA: NEGATIVE

## 2019-09-30 NOTE — Progress Notes (Signed)
   GYN VISIT Patient name: Cristina Davenport MRN [id number]  Date of birth: Jul 06, 1986 Chief Complaint:   Routine Prenatal Visit (TCA treatment)  History of Present Illness:   Cristina Davenport is a 33 y.o. [phone] Caucasian female at [redacted]w[redacted]d being seen today for 5th TCA tx, all spots had cleared previously w/ TCA, noticed this spot this week. Good fm, some irregular uc's.     Patient's last menstrual period was 02/13/2019 (exact date). Review of Systems:   Pertinent items are noted in HPI Denies fever/chills, dizziness, headaches, visual disturbances, fatigue, shortness of breath, chest pain, abdominal pain, vomiting, abnormal vaginal discharge/itching/odor/irritation, problems with periods, bowel movements, urination, or intercourse unless otherwise stated above.  Pertinent History Reviewed:  Reviewed past medical,surgical, social, obstetrical and family history.  Reviewed problem list, medications and allergies. Physical Assessment:   Vitals:   09/30/19 1610  BP: 117/70  Pulse: 78  Weight: 182 lb (82.6 kg)  Body mass index is 34.39 kg/m.       Physical Examination:   General appearance: alert, well appearing, and in no distress  Mental status: alert, oriented to person, place, and time  Skin: warm & dry   Cardiovascular: normal heart rate noted  Respiratory: normal respiratory effort, no distress  Abdomen: soft, non-tender   Pelvic: TCA applied to ~1cm flush condyloma w/ multiple finger-like projections Lt inner labia minora  Extremities: no edema   Chaperone: Rolena Infante    FH: 32cm FHT: 135  Results for orders placed or performed in visit on 09/30/19 (from the past 24 hour(s))  POC Urinalysis Dipstick OB   Collection Time: 09/30/19  4:11 PM  Result Value Ref Range   Color, UA     Clarity, UA     Glucose, UA Negative Negative   Bilirubin, UA     Ketones, UA neg    Spec Grav, UA     Blood, UA trace    pH, UA     POC,PROTEIN,UA Negative Negative, Trace, Small (1+),  Moderate (2+), Large (3+), 4+   Urobilinogen, UA     Nitrite, UA neg    Leukocytes, UA Negative Negative   Appearance     Odor      Assessment & Plan:  1) [redacted]w[redacted]d pregnant  2) Vulvar condyloma> 5th TCA tx today  Meds: No orders of the defined types were placed in this encounter.   Orders Placed This Encounter  Procedures  . POC Urinalysis Dipstick OB    Return for next Mon or Tues for TCA tx.  [place], [facility] 09/30/2019 4:41 PM

## 2019-09-30 NOTE — Patient Instructions (Signed)
Cristina Davenport, I greatly value your feedback.  If you receive a survey following your visit with [place] today, we appreciate you taking the time to fill it out.  Thanks, [name], CNM, [facility]  [place]!!! It is now [place] at [facility] ([place], [place] [address]) Entrance located off of [address] parking   Go to [email or web address] to register for FREE online childbirth classes    Call the office [phone]) or go to [facility] if:  You begin to have strong, frequent contractions  Your water breaks.  Sometimes it is a big gush of fluid, sometimes it is just a trickle that keeps getting your panties wet or running down your legs  You have vaginal bleeding.  It is normal to have a small amount of spotting if your cervix was checked.   You don't feel your baby moving like normal.  If you don't, get you something to eat and drink and lay down and focus on feeling your baby move.  You should feel at least 10 movements in 2 hours.  If you don't, you should call the office or go to [place] Blood Pressure Monitoring for Patients   Your provider has recommended that you check your blood pressure (BP) at least once a week at home. If you do not have a blood pressure cuff at home, one will be provided for you. Contact your provider if you have not received your monitor within 1 week.   Helpful Tips for Accurate Home Blood Pressure Checks  . Don't smoke, exercise, or drink caffeine 30 minutes before checking your BP . Use the restroom before checking your BP (a full bladder can raise your pressure) . Relax in a comfortable upright chair . Feet on the ground . Left arm resting comfortably on a flat surface at the level of your heart . Legs uncrossed . Back supported . Sit quietly and don't talk . Place the cuff on your bare arm . Adjust snuggly, so that only two fingertips can fit between your skin and  the top of the cuff . Check 2 readings separated by at least one minute . Keep a log of your BP readings . For a visual, please reference this diagram: http://ccnc.care/bpdiagram  Provider Name: [facility]     Phone: [phone]  Zone 1: ALL CLEAR  Continue to monitor your symptoms:  . BP reading is less than 140 (top number) or less than 90 (bottom number)  . No right upper stomach pain . No headaches or seeing spots . No feeling nauseated or throwing up . No swelling in face and hands  Zone 2: CAUTION Call your doctor's office for any of the following:  . BP reading is greater than 140 (top number) or greater than 90 (bottom number)  . Stomach pain under your ribs in the middle or right side . Headaches or seeing spots . Feeling nauseated or throwing up . Swelling in face and hands  Zone 3: EMERGENCY  Seek immediate medical care if you have any of the following:  . BP reading is greater than160 (top number) or greater than 110 (bottom number) . Severe headaches not improving with Tylenol . Serious difficulty catching your breath . Any worsening symptoms from Zone 2    Preterm Labor and Birth Information  The normal length of a pregnancy is 39-41 weeks. Preterm labor is when labor starts  before 37 completed weeks of pregnancy. What are the risk factors for preterm labor? Preterm labor is more likely to occur in women who:  Have certain infections during pregnancy such as a bladder infection, sexually transmitted infection, or infection inside the uterus (chorioamnionitis).  Have a shorter-than-normal cervix.  Have gone into preterm labor before.  Have had surgery on their cervix.  Are younger than age 79 or older than age 80.  Are African American.  Are pregnant with twins or multiple babies (multiple gestation).  Take street drugs or smoke while pregnant.  Do not gain enough weight while pregnant.  Became pregnant shortly after having been pregnant.  What are the symptoms of preterm labor? Symptoms of preterm labor include:  Cramps similar to those that can happen during a menstrual period. The cramps may happen with diarrhea.  Pain in the abdomen or lower back.  Regular uterine contractions that may feel like tightening of the abdomen.  A feeling of increased pressure in the pelvis.  Increased watery or bloody mucus discharge from the vagina.  Water breaking (ruptured amniotic sac). Why is it important to recognize signs of preterm labor? It is important to recognize signs of preterm labor because babies who are born prematurely may not be fully developed. This can put them at an increased risk for:  Long-term (chronic) heart and lung problems.  Difficulty immediately after birth with regulating body systems, including blood sugar, body temperature, heart rate, and breathing rate.  Bleeding in the brain.  Cerebral palsy.  Learning difficulties.  Death. These risks are highest for babies who are born before 50 weeks of pregnancy. How is preterm labor treated? Treatment depends on the length of your pregnancy, your condition, and the health of your baby. It may involve:  Having a stitch (suture) placed in your cervix to prevent your cervix from opening too early (cerclage).  Taking or being given medicines, such as: ? Hormone medicines. These may be given early in pregnancy to help support the pregnancy. ? Medicine to stop contractions. ? Medicines to help mature the baby's lungs. These may be prescribed if the risk of delivery is high. ? Medicines to prevent your baby from developing cerebral palsy. If the labor happens before 34 weeks of pregnancy, you may need to stay in the hospital. What should I do if I think I am in preterm labor? If you think that you are going into preterm labor, call your health care provider right away. How can I prevent preterm labor in future pregnancies? To increase your chance of having a  full-term pregnancy:  Do not use any tobacco products, such as cigarettes, chewing tobacco, and e-cigarettes. If you need help quitting, ask your health care provider.  Do not use street drugs or medicines that have not been prescribed to you during your pregnancy.  Talk with your health care provider before taking any herbal supplements, even if you have been taking them regularly.  Make sure you gain a healthy amount of weight during your pregnancy.  Watch for infection. If you think that you might have an infection, get it checked right away.  Make sure to tell your health care provider if you have gone into preterm labor before. This information is not intended to replace advice given to you by your health care provider. Make sure you discuss any questions you have with your health care provider. Document Released: 01/21/2004 Document Revised: 02/22/2019 Document Reviewed: 03/23/2016 Elsevier Patient Education  2020 [employer].

## 2019-10-02 ENCOUNTER — Encounter: Payer: Self-pay | Admitting: *Deleted

## 2019-10-06 ENCOUNTER — Other Ambulatory Visit: Payer: Self-pay | Admitting: Family Medicine

## 2019-10-07 ENCOUNTER — Other Ambulatory Visit: Payer: Self-pay | Admitting: *Deleted

## 2019-10-07 ENCOUNTER — Ambulatory Visit (INDEPENDENT_AMBULATORY_CARE_PROVIDER_SITE_OTHER): Payer: 59 | Admitting: Women's Health

## 2019-10-07 ENCOUNTER — Other Ambulatory Visit: Payer: Self-pay

## 2019-10-07 ENCOUNTER — Encounter: Payer: Self-pay | Admitting: *Deleted

## 2019-10-07 ENCOUNTER — Encounter: Payer: Self-pay | Admitting: Women's Health

## 2019-10-07 VITALS — BP 117/74 | HR 87 | Wt 185.0 lb

## 2019-10-07 DIAGNOSIS — O358XX0 Maternal care for other (suspected) fetal abnormality and damage, not applicable or unspecified: Secondary | ICD-10-CM

## 2019-10-07 DIAGNOSIS — O35HXX0 Maternal care for other (suspected) fetal abnormality and damage, fetal lower extremities anomalies, not applicable or unspecified: Secondary | ICD-10-CM

## 2019-10-07 DIAGNOSIS — Z3A33 33 weeks gestation of pregnancy: Secondary | ICD-10-CM | POA: Diagnosis not present

## 2019-10-07 DIAGNOSIS — Z331 Pregnant state, incidental: Secondary | ICD-10-CM

## 2019-10-07 DIAGNOSIS — Z3483 Encounter for supervision of other normal pregnancy, third trimester: Secondary | ICD-10-CM

## 2019-10-07 DIAGNOSIS — O99891 Other specified diseases and conditions complicating pregnancy: Secondary | ICD-10-CM | POA: Diagnosis not present

## 2019-10-07 DIAGNOSIS — A630 Anogenital (venereal) warts: Secondary | ICD-10-CM

## 2019-10-07 DIAGNOSIS — Z1389 Encounter for screening for other disorder: Secondary | ICD-10-CM

## 2019-10-07 LAB — POCT URINALYSIS DIPSTICK OB
Blood, UA: NEGATIVE
Glucose, UA: NEGATIVE
Ketones, UA: NEGATIVE
Leukocytes, UA: NEGATIVE
Nitrite, UA: NEGATIVE
POC,PROTEIN,UA: NEGATIVE

## 2019-10-07 NOTE — Patient Instructions (Signed)
Cristina Davenport, I greatly value your feedback.  If you receive a survey following your visit with [place] today, we appreciate you taking the time to fill it out.  Thanks, [name], CNM, [facility]  [place]!!! It is now [place] at [facility] ([place], [place] [address]) Entrance located off of [address] parking   Go to [email or web address] to register for FREE online childbirth classes    Call the office [phone]) or go to [facility] if:  You begin to have strong, frequent contractions  Your water breaks.  Sometimes it is a big gush of fluid, sometimes it is just a trickle that keeps getting your panties wet or running down your legs  You have vaginal bleeding.  It is normal to have a small amount of spotting if your cervix was checked.   You don't feel your baby moving like normal.  If you don't, get you something to eat and drink and lay down and focus on feeling your baby move.  You should feel at least 10 movements in 2 hours.  If you don't, you should call the office or go to [place] Blood Pressure Monitoring for Patients   Your provider has recommended that you check your blood pressure (BP) at least once a week at home. If you do not have a blood pressure cuff at home, one will be provided for you. Contact your provider if you have not received your monitor within 1 week.   Helpful Tips for Accurate Home Blood Pressure Checks  . Don't smoke, exercise, or drink caffeine 30 minutes before checking your BP . Use the restroom before checking your BP (a full bladder can raise your pressure) . Relax in a comfortable upright chair . Feet on the ground . Left arm resting comfortably on a flat surface at the level of your heart . Legs uncrossed . Back supported . Sit quietly and don't talk . Place the cuff on your bare arm . Adjust snuggly, so that only two fingertips can fit between your skin and  the top of the cuff . Check 2 readings separated by at least one minute . Keep a log of your BP readings . For a visual, please reference this diagram: http://ccnc.care/bpdiagram  Provider Name: [facility]     Phone: [phone]  Zone 1: ALL CLEAR  Continue to monitor your symptoms:  . BP reading is less than 140 (top number) or less than 90 (bottom number)  . No right upper stomach pain . No headaches or seeing spots . No feeling nauseated or throwing up . No swelling in face and hands  Zone 2: CAUTION Call your doctor's office for any of the following:  . BP reading is greater than 140 (top number) or greater than 90 (bottom number)  . Stomach pain under your ribs in the middle or right side . Headaches or seeing spots . Feeling nauseated or throwing up . Swelling in face and hands  Zone 3: EMERGENCY  Seek immediate medical care if you have any of the following:  . BP reading is greater than160 (top number) or greater than 110 (bottom number) . Severe headaches not improving with Tylenol . Serious difficulty catching your breath . Any worsening symptoms from Zone 2    Preterm Labor and Birth Information  The normal length of a pregnancy is 39-41 weeks. Preterm labor is when labor starts  before 37 completed weeks of pregnancy. What are the risk factors for preterm labor? Preterm labor is more likely to occur in women who:  Have certain infections during pregnancy such as a bladder infection, sexually transmitted infection, or infection inside the uterus (chorioamnionitis).  Have a shorter-than-normal cervix.  Have gone into preterm labor before.  Have had surgery on their cervix.  Are younger than age 68 or older than age 98.  Are African American.  Are pregnant with twins or multiple babies (multiple gestation).  Take street drugs or smoke while pregnant.  Do not gain enough weight while pregnant.  Became pregnant shortly after having been pregnant.  What are the symptoms of preterm labor? Symptoms of preterm labor include:  Cramps similar to those that can happen during a menstrual period. The cramps may happen with diarrhea.  Pain in the abdomen or lower back.  Regular uterine contractions that may feel like tightening of the abdomen.  A feeling of increased pressure in the pelvis.  Increased watery or bloody mucus discharge from the vagina.  Water breaking (ruptured amniotic sac). Why is it important to recognize signs of preterm labor? It is important to recognize signs of preterm labor because babies who are born prematurely may not be fully developed. This can put them at an increased risk for:  Long-term (chronic) heart and lung problems.  Difficulty immediately after birth with regulating body systems, including blood sugar, body temperature, heart rate, and breathing rate.  Bleeding in the brain.  Cerebral palsy.  Learning difficulties.  Death. These risks are highest for babies who are born before 77 weeks of pregnancy. How is preterm labor treated? Treatment depends on the length of your pregnancy, your condition, and the health of your baby. It may involve:  Having a stitch (suture) placed in your cervix to prevent your cervix from opening too early (cerclage).  Taking or being given medicines, such as: ? Hormone medicines. These may be given early in pregnancy to help support the pregnancy. ? Medicine to stop contractions. ? Medicines to help mature the baby's lungs. These may be prescribed if the risk of delivery is high. ? Medicines to prevent your baby from developing cerebral palsy. If the labor happens before 34 weeks of pregnancy, you may need to stay in the hospital. What should I do if I think I am in preterm labor? If you think that you are going into preterm labor, call your health care provider right away. How can I prevent preterm labor in future pregnancies? To increase your chance of having a  full-term pregnancy:  Do not use any tobacco products, such as cigarettes, chewing tobacco, and e-cigarettes. If you need help quitting, ask your health care provider.  Do not use street drugs or medicines that have not been prescribed to you during your pregnancy.  Talk with your health care provider before taking any herbal supplements, even if you have been taking them regularly.  Make sure you gain a healthy amount of weight during your pregnancy.  Watch for infection. If you think that you might have an infection, get it checked right away.  Make sure to tell your health care provider if you have gone into preterm labor before. This information is not intended to replace advice given to you by your health care provider. Make sure you discuss any questions you have with your health care provider. Document Released: 01/21/2004 Document Revised: 02/22/2019 Document Reviewed: 03/23/2016 Elsevier Patient Education  2020 [employer].

## 2019-10-07 NOTE — Telephone Encounter (Signed)
Last med check up 02/26/19

## 2019-10-07 NOTE — Progress Notes (Signed)
   LOW-RISK PREGNANCY VISIT Patient name: Cristina Davenport MRN [id number]  Date of birth: 07-31-1986 Chief Complaint:   Routine Prenatal Visit  History of Present Illness:   Cristina Davenport is a 33 y.o. [phone] female at [redacted]w[redacted]d with an Estimated Date of Delivery: 11/20/19 being seen today for ongoing management of a low-risk pregnancy and 6th TCA tx.  Today she reports some contractions, pelvic pain when baby moves. Contractions: Irregular. Vag. Bleeding: None.  Movement: Present. denies leaking of fluid. Review of Systems:   Pertinent items are noted in HPI Denies abnormal vaginal discharge w/ itching/odor/irritation, headaches, visual changes, shortness of breath, chest pain, abdominal pain, severe nausea/vomiting, or problems with urination or bowel movements unless otherwise stated above. Pertinent History Reviewed:  Reviewed past medical,surgical, social, obstetrical and family history.  Reviewed problem list, medications and allergies. Physical Assessment:   Vitals:   10/07/19 1025  BP: 117/74  Pulse: 87  Weight: 185 lb (83.9 kg)  Body mass index is 34.96 kg/m.        Physical Examination:   General appearance: Well appearing, and in no distress  Mental status: Alert, oriented to person, place, and time  Skin: Warm & dry  Cardiovascular: Normal heart rate noted  Respiratory: Normal respiratory effort, no distress  Abdomen: Soft, gravid, nontender  Pelvic: Cervical exam deferred, 6th TCA tx to condyloma from last week, much improved today, almost gone        Extremities: Edema: Trace  Fetal Status: Fetal Heart Rate (bpm): 147 Fundal Height: 34 cm Movement: Present    Chaperone: [place]    Results for orders placed or performed in visit on 10/07/19 (from the past 24 hour(s))  POC Urinalysis Dipstick OB   Collection Time: 10/07/19 10:27 AM  Result Value Ref Range   Color, UA     Clarity, UA     Glucose, UA Negative Negative   Bilirubin, UA     Ketones, UA n    Spec  Grav, UA     Blood, UA n    pH, UA     POC,PROTEIN,UA Negative Negative, Trace, Small (1+), Moderate (2+), Large (3+), 4+   Urobilinogen, UA     Nitrite, UA n    Leukocytes, UA Negative Negative   Appearance     Odor      Assessment & Plan:  1) Low-risk pregnancy [id number] at [redacted]w[redacted]d with an Estimated Date of Delivery: 11/20/19   2) 6th TCA tx to vulvar condyloma  3) EFW 14% w/ short FL> per JVF u/s w/ MFM @ 36wks, ordered but I don't see appt, note routed to Tish to check on    Meds: No orders of the defined types were placed in this encounter.  Labs/procedures today: TCA  Plan:  Continue routine obstetrical care  Next visit: prefers in person    Reviewed: Preterm labor symptoms and general obstetric precautions including but not limited to vaginal bleeding, contractions, leaking of fluid and fetal movement were reviewed in detail with the patient.  All questions were answered.   Follow-up: Return in about 2 weeks (around 10/21/2019) for [place], in person, CNM.  Orders Placed This Encounter  Procedures  . POC Urinalysis Dipstick OB   Roma Schanz CNM, [facility] 10/07/2019 10:55 AM

## 2019-10-08 ENCOUNTER — Other Ambulatory Visit: Payer: Self-pay | Admitting: Women's Health

## 2019-10-08 ENCOUNTER — Other Ambulatory Visit: Payer: Self-pay | Admitting: *Deleted

## 2019-10-08 MED ORDER — SERTRALINE HCL 100 MG PO TABS: ORAL_TABLET | ORAL | 1 refills | Status: DC

## 2019-10-09 ENCOUNTER — Encounter: Payer: 59 | Admitting: Obstetrics and Gynecology

## 2019-10-15 ENCOUNTER — Inpatient Hospital Stay (HOSPITAL_COMMUNITY)
Admission: AD | Admit: 2019-10-15 | Discharge: 2019-10-15 | Disposition: A | Discharge status: Home / Self Care | Payer: 59 | Attending: Obstetrics & Gynecology | Admitting: Obstetrics & Gynecology

## 2019-10-15 ENCOUNTER — Encounter (HOSPITAL_COMMUNITY): Payer: Self-pay | Admitting: *Deleted

## 2019-10-15 ENCOUNTER — Other Ambulatory Visit: Payer: Self-pay

## 2019-10-15 DIAGNOSIS — O26893 Other specified pregnancy related conditions, third trimester: Secondary | ICD-10-CM | POA: Insufficient documentation

## 2019-10-15 DIAGNOSIS — R519 Headache, unspecified: Secondary | ICD-10-CM | POA: Diagnosis not present

## 2019-10-15 DIAGNOSIS — Z3689 Encounter for other specified antenatal screening: Secondary | ICD-10-CM

## 2019-10-15 DIAGNOSIS — Z3A34 34 weeks gestation of pregnancy: Secondary | ICD-10-CM | POA: Diagnosis not present

## 2019-10-15 DIAGNOSIS — R030 Elevated blood-pressure reading, without diagnosis of hypertension: Secondary | ICD-10-CM | POA: Diagnosis not present

## 2019-10-15 LAB — PROTEIN / CREATININE RATIO, URINE
Creatinine, Urine: 27.96 mg/dL
Total Protein, Urine: <6 mg/dL

## 2019-10-15 LAB — COMPREHENSIVE METABOLIC PANEL
ALT: 19 U/L (ref 0–44)
AST: 19 U/L (ref 15–41)
Albumin: 2.9 g/dL — ABNORMAL LOW (ref 3.5–5.0)
Alkaline Phosphatase: 97 U/L (ref 38–126)
Anion gap: 16 — ABNORMAL HIGH (ref 5–15)
BUN: <5 mg/dL — ABNORMAL LOW (ref 6–20)
CO2: 15 mmol/L — ABNORMAL LOW (ref 22–32)
Calcium: 9 mg/dL (ref 8.9–10.3)
Chloride: 105 mmol/L (ref 98–111)
Creatinine, Ser: 0.53 mg/dL (ref 0.44–1.00)
GFR calc Af Amer: >60 mL/min (ref >60)
GFR calc non Af Amer: >60 mL/min (ref >60)
Glucose, Bld: 74 mg/dL (ref 70–99)
Potassium: 3.8 mmol/L (ref 3.5–5.1)
Sodium: 136 mmol/L (ref 135–145)
Total Bilirubin: 0.6 mg/dL (ref 0.3–1.2)
Total Protein: 6 g/dL — ABNORMAL LOW (ref 6.5–8.1)

## 2019-10-15 LAB — URINALYSIS, ROUTINE W REFLEX MICROSCOPIC
Bilirubin Urine: NEGATIVE
Glucose, UA: NEGATIVE mg/dL
Hgb urine dipstick: NEGATIVE
Ketones, ur: NEGATIVE mg/dL
Leukocytes,Ua: NEGATIVE
Nitrite: NEGATIVE
Protein, ur: NEGATIVE mg/dL
Specific Gravity, Urine: 1.005 (ref 1.005–1.030)
pH: 7 (ref 5.0–8.0)

## 2019-10-15 LAB — CBC
HCT: 36.1 % (ref 36.0–46.0)
Hemoglobin: 12.2 g/dL (ref 12.0–15.0)
MCH: 30 pg (ref 26.0–34.0)
MCHC: 33.8 g/dL (ref 30.0–36.0)
MCV: 88.7 fL (ref 80.0–100.0)
Platelets: 188 10*3/uL (ref 150–400)
RBC: 4.07 MIL/uL (ref 3.87–5.11)
RDW: 13.1 % (ref 11.5–15.5)
WBC: 10.1 10*3/uL (ref 4.0–10.5)
nRBC: 0 % (ref 0.0–0.2)

## 2019-10-15 MED ORDER — ACETAMINOPHEN 500 MG PO TABS
1000.0000 mg | ORAL_TABLET | Freq: Once | ORAL | Status: AC
  Administered 2019-10-15: 1000 mg via ORAL
  Filled 2019-10-15: qty 2

## 2019-10-15 NOTE — MAU Note (Addendum)
.   Cristina Davenport is a 33 y.o. at [redacted]w[redacted]d here in MAU reporting: increase in blood pressure since thanksgiving. States that she has had a headache since yesterday. Hx Of PRE E in 2009 LMP:  Onset of complaint: yesterday Pain score: 8 Vitals:   10/15/19 0759  BP: 124/77  Pulse: 92  Resp: 16  Temp: 98.6 F (37 C)  SpO2: 100%     FHT:146 Lab orders placed from triage: UA

## 2019-10-15 NOTE — MAU Provider Note (Signed)
Chief Complaint  Patient presents with  . Hypertension  . Headache     First Provider Initiated Contact with Patient 10/15/19 0820      S: Cristina Davenport  is a 33 y.o. y.o. year old [phone] female at [redacted]w[redacted]d weeks gestation who presents to MAU with elevated blood pressures. She reports hx of PEC in previous pregnancy (2009) - denies HTN during this pregnancy and not currently on BP medication.   She reports blood pressure has been elevated since Thanksgiving (11/26)- took BP that day because she "was not feeling well"- BP was 141/118 she reports. This is the same BP cuff she has used throughout pregnancy and denies elevated BP prior to this time. Since Thanksgiving she reports BP has been labile around 133/85. Last night started having a HA, reports around 0300 this morning she woke up due to the HA worsening took BP which was 150/99, then repeated it in an hour and it was 140/99.   She reports continue HA since being in MAU, rates pain 6/10 for HA and has not taken any medication for HA. She denies vision changes, RUQ pain, epigastric pain or any other s/s of PEC.  She denies contractions or abdominal pain, denies vaginal bleeding or discharge. +FM.   O:  Patient Vitals for the past 24 hrs:  BP Temp Pulse Resp SpO2 Height Weight  10/15/19 1015 117/71 - 81 - 99 % - -  10/15/19 1000 119/77 - 79 - 98 % - -  10/15/19 0930 119/75 - 77 - 98 % - -  10/15/19 0915 122/83 - 79 - 98 % - -  10/15/19 0900 120/77 - 79 - 97 % - -  10/15/19 0850 - - - - 98 % - -  10/15/19 0846 122/78 - 79 - - - -  10/15/19 0831 126/85 - 96 - - - -  10/15/19 0816 128/84 - 90 - - - -  10/15/19 0759 124/77 98.6 F (37 C) 92 16 100 % 5\' 1"  (1.549 m) 83.9 kg   General: NAD Heart: Regular rate Lungs: Normal rate and effort Abd: Soft, NT, Gravid, S=D Extremities: no pedal edema Neuro: 2+ deep tendon reflexes, No clonus Pelvic: deferred    FHR: 130/moderate/+accels/ no decelerations  Toco: irregular mild  contractions   Treatments in MAU included 1,000mg  Tylenol given at 0850  Results for orders placed or performed during the hospital encounter of 10/15/19 (from the past 24 hour(s))  Urinalysis, Routine w reflex microscopic     Status: Abnormal   Collection Time: 10/15/19  8:37 AM  Result Value Ref Range   Color, Urine STRAW (A) YELLOW   APPearance CLEAR CLEAR   Specific Gravity, Urine 1.005 1.005 - 1.030   pH 7.0 5.0 - 8.0   Glucose, UA NEGATIVE NEGATIVE mg/dL   Hgb urine dipstick NEGATIVE NEGATIVE   Bilirubin Urine NEGATIVE NEGATIVE   Ketones, ur NEGATIVE NEGATIVE mg/dL   Protein, ur NEGATIVE NEGATIVE mg/dL   Nitrite NEGATIVE NEGATIVE   Leukocytes,Ua NEGATIVE NEGATIVE  Protein / creatinine ratio, urine     Status: None   Collection Time: 10/15/19  8:37 AM  Result Value Ref Range   Creatinine, Urine 27.96 mg/dL   Total Protein, Urine <6.0 mg/dL   Protein Creatinine Ratio        0.00 - 0.15 mg/mg[Cre]  CBC     Status: None   Collection Time: 10/15/19  9:09 AM  Result Value Ref Range   WBC 10.1 4.0 - 10.5  K/uL   RBC 4.07 3.87 - 5.11 MIL/uL   Hemoglobin 12.2 12.0 - 15.0 g/dL   HCT 36.1 36.0 - 46.0 %   MCV 88.7 80.0 - 100.0 fL   MCH 30.0 26.0 - 34.0 pg   MCHC 33.8 30.0 - 36.0 g/dL   RDW 13.1 11.5 - 15.5 %   Platelets 188 150 - 400 K/uL   nRBC 0.0 0.0 - 0.2 %  Comprehensive metabolic panel     Status: Abnormal   Collection Time: 10/15/19  9:09 AM  Result Value Ref Range   Sodium 136 135 - 145 mmol/L   Potassium 3.8 3.5 - 5.1 mmol/L   Chloride 105 98 - 111 mmol/L   CO2 15 (L) 22 - 32 mmol/L   Glucose, Bld 74 70 - 99 mg/dL   BUN <5 (L) 6 - 20 mg/dL   Creatinine, Ser 0.53 0.44 - 1.00 mg/dL   Calcium 9.0 8.9 - 10.3 mg/dL   Total Protein 6.0 (L) 6.5 - 8.1 g/dL   Albumin 2.9 (L) 3.5 - 5.0 g/dL   AST 19 15 - 41 U/L   ALT 19 0 - 44 U/L   Alkaline Phosphatase 97 38 - 126 U/L   Total Bilirubin 0.6 0.3 - 1.2 mg/dL   GFR calc non Af Amer >60 >60 mL/min   GFR calc Af Amer >60  >60 mL/min   Anion gap 16 (H) 5 - 15    Reassessment- patient reports HA is now 1/10 after treatment with Tylenol  Normotensive in MAU  Consult with Dr Hulan Fray on assessment and results, okay with discharge home and follow up in the office as scheduled    A: [redacted]w[redacted]d week IUP Normotensive FHR reactive  P: Discharge home in stable condition per consult with [name], MD Preeclampsia precautions. Follow up as scheduled in the office for prenatal care  Return to maternity admissions as needed in emergencies  [name], CNM 10/15/2019 11:40 AM

## 2019-10-21 ENCOUNTER — Other Ambulatory Visit: Payer: Self-pay

## 2019-10-21 ENCOUNTER — Other Ambulatory Visit (HOSPITAL_COMMUNITY)
Admission: RE | Admit: 2019-10-21 | Discharge: 2019-10-21 | Disposition: A | Discharge status: Home / Self Care | Payer: 59 | Source: Ambulatory Visit | Attending: Obstetrics & Gynecology | Admitting: Obstetrics & Gynecology

## 2019-10-21 ENCOUNTER — Encounter: Payer: Self-pay | Admitting: Women's Health

## 2019-10-21 ENCOUNTER — Ambulatory Visit (INDEPENDENT_AMBULATORY_CARE_PROVIDER_SITE_OTHER): Payer: 59 | Admitting: Women's Health

## 2019-10-21 VITALS — BP 123/82 | HR 93 | Wt 187.0 lb

## 2019-10-21 DIAGNOSIS — Z3A36 36 weeks gestation of pregnancy: Secondary | ICD-10-CM | POA: Insufficient documentation

## 2019-10-21 DIAGNOSIS — Z331 Pregnant state, incidental: Secondary | ICD-10-CM

## 2019-10-21 DIAGNOSIS — Z3A35 35 weeks gestation of pregnancy: Secondary | ICD-10-CM

## 2019-10-21 DIAGNOSIS — Z3483 Encounter for supervision of other normal pregnancy, third trimester: Secondary | ICD-10-CM

## 2019-10-21 DIAGNOSIS — Z1389 Encounter for screening for other disorder: Secondary | ICD-10-CM

## 2019-10-21 LAB — POCT URINALYSIS DIPSTICK OB
Blood, UA: NEGATIVE
Glucose, UA: NEGATIVE
Ketones, UA: NEGATIVE
Leukocytes, UA: NEGATIVE
Nitrite, UA: NEGATIVE
POC,PROTEIN,UA: NEGATIVE

## 2019-10-21 NOTE — Patient Instructions (Signed)
Dorcas Carrow, I greatly value your feedback.  If you receive a survey following your visit with [place] today, we appreciate you taking the time to fill it out.  Thanks, [name], CNM, [facility]  [place]!!! It is now [place] at [facility] ([place], [place] [address]) Entrance located off of [place] parking   Go to [email or web address] to register for FREE online childbirth classes    Call the office [phone]) or go to [facility] if:  You begin to have strong, frequent contractions  Your water breaks.  Sometimes it is a big gush of fluid, sometimes it is just a trickle that keeps getting your panties wet or running down your legs  You have vaginal bleeding.  It is normal to have a small amount of spotting if your cervix was checked.   You don't feel your baby moving like normal.  If you don't, get you something to eat and drink and lay down and focus on feeling your baby move.  You should feel at least 10 movements in 2 hours.  If you don't, you should call the office or go to [facility].   Call the office [phone]) or go to [facility] hospital for these signs of pre-eclampsia:  Severe headache that does not go away with Tylenol  Visual changes- seeing spots, double, blurred vision  Pain under your right breast or upper abdomen that does not go away with Tums or heartburn medicine  Nausea and/or vomiting  Severe swelling in your hands, feet, and face      Home Blood Pressure Monitoring for Patients   Your provider has recommended that you check your blood pressure (BP) at least once a week at home. If you do not have a blood pressure cuff at home, one will be provided for you. Contact your provider if you have not received your monitor within 1 week.   Helpful Tips for Accurate Home Blood Pressure Checks  . Don't smoke, exercise, or drink caffeine 30 minutes before checking your BP . Use the  restroom before checking your BP (a full bladder can raise your pressure) . Relax in a comfortable upright chair . Feet on the ground . Left arm resting comfortably on a flat surface at the level of your heart . Legs uncrossed . Back supported . Sit quietly and don't talk . Place the cuff on your bare arm . Adjust snuggly, so that only two fingertips can fit between your skin and the top of the cuff . Check 2 readings separated by at least one minute . Keep a log of your BP readings . For a visual, please reference this diagram: http://ccnc.care/bpdiagram  Provider Name: [facility]     Phone: [phone]  Zone 1: ALL CLEAR  Continue to monitor your symptoms:  . BP reading is less than 140 (top number) or less than 90 (bottom number)  . No right upper stomach pain . No headaches or seeing spots . No feeling nauseated or throwing up . No swelling in face and hands  Zone 2: CAUTION Call your doctor's office for any of the following:  . BP reading is greater than 140 (top number) or greater than 90 (bottom number)  . Stomach pain under your ribs in the middle or right side . Headaches or seeing spots . Feeling nauseated or throwing up . Swelling in face and hands  Zone 3: EMERGENCY  Seek  immediate medical care if you have any of the following:  . BP reading is greater than160 (top number) or greater than 110 (bottom number) . Severe headaches not improving with Tylenol . Serious difficulty catching your breath . Any worsening symptoms from Zone 2    Preterm Labor and Birth Information  The normal length of a pregnancy is 39-41 weeks. Preterm labor is when labor starts before 37 completed weeks of pregnancy. What are the risk factors for preterm labor? Preterm labor is more likely to occur in women who:  Have certain infections during pregnancy such as a bladder infection, sexually transmitted infection, or infection inside the uterus (chorioamnionitis).  Have a  shorter-than-normal cervix.  Have gone into preterm labor before.  Have had surgery on their cervix.  Are younger than age 62 or older than age 30.  Are African American.  Are pregnant with twins or multiple babies (multiple gestation).  Take street drugs or smoke while pregnant.  Do not gain enough weight while pregnant.  Became pregnant shortly after having been pregnant. What are the symptoms of preterm labor? Symptoms of preterm labor include:  Cramps similar to those that can happen during a menstrual period. The cramps may happen with diarrhea.  Pain in the abdomen or lower back.  Regular uterine contractions that may feel like tightening of the abdomen.  A feeling of increased pressure in the pelvis.  Increased watery or bloody mucus discharge from the vagina.  Water breaking (ruptured amniotic sac). Why is it important to recognize signs of preterm labor? It is important to recognize signs of preterm labor because babies who are born prematurely may not be fully developed. This can put them at an increased risk for:  Long-term (chronic) heart and lung problems.  Difficulty immediately after birth with regulating body systems, including blood sugar, body temperature, heart rate, and breathing rate.  Bleeding in the brain.  Cerebral palsy.  Learning difficulties.  Death. These risks are highest for babies who are born before 64 weeks of pregnancy. How is preterm labor treated? Treatment depends on the length of your pregnancy, your condition, and the health of your baby. It may involve:  Having a stitch (suture) placed in your cervix to prevent your cervix from opening too early (cerclage).  Taking or being given medicines, such as: ? Hormone medicines. These may be given early in pregnancy to help support the pregnancy. ? Medicine to stop contractions. ? Medicines to help mature the baby's lungs. These may be prescribed if the risk of delivery is high. ?  Medicines to prevent your baby from developing cerebral palsy. If the labor happens before 34 weeks of pregnancy, you may need to stay in the hospital. What should I do if I think I am in preterm labor? If you think that you are going into preterm labor, call your health care provider right away. How can I prevent preterm labor in future pregnancies? To increase your chance of having a full-term pregnancy:  Do not use any tobacco products, such as cigarettes, chewing tobacco, and e-cigarettes. If you need help quitting, ask your health care provider.  Do not use street drugs or medicines that have not been prescribed to you during your pregnancy.  Talk with your health care provider before taking any herbal supplements, even if you have been taking them regularly.  Make sure you gain a healthy amount of weight during your pregnancy.  Watch for infection. If you think that you might have an  infection, get it checked right away.  Make sure to tell your health care provider if you have gone into preterm labor before. This information is not intended to replace advice given to you by your health care provider. Make sure you discuss any questions you have with your health care provider. Document Released: 01/21/2004 Document Revised: 02/22/2019 Document Reviewed: 03/23/2016 Elsevier Patient Education  2020 [employer].

## 2019-10-21 NOTE — Progress Notes (Signed)
   LOW-RISK PREGNANCY VISIT Patient name: Cristina Davenport MRN [id number]  Date of birth: 03/18/1986 Chief Complaint:   Routine Prenatal Visit  History of Present Illness:   Cristina Davenport is a 33 y.o. [id number] female at [redacted]w[redacted]d with an Estimated Date of Delivery: 11/20/19 being seen today for ongoing management of a low-risk pregnancy.  Today she reports feeling miserable, lots of pressure, painful uc's this am, better now, occ Lt side lower abd pain/back pain . Contractions: Irregular. Vag. Bleeding: None.  Movement: Present. denies leaking of fluid. Review of Systems:   Pertinent items are noted in HPI Denies abnormal vaginal discharge w/ itching/odor/irritation, headaches, visual changes, shortness of breath, chest pain, abdominal pain, severe nausea/vomiting, or problems with urination or bowel movements unless otherwise stated above. Pertinent History Reviewed:  Reviewed past medical,surgical, social, obstetrical and family history.  Reviewed problem list, medications and allergies. Physical Assessment:   Vitals:   10/21/19 0958  BP: 123/82  Pulse: 93  Weight: 187 lb (84.8 kg)  Body mass index is 35.33 kg/m.        Physical Examination:   General appearance: Well appearing, and in no distress  Mental status: Alert, oriented to person, place, and time  Skin: Warm & dry  Cardiovascular: Normal heart rate noted  Respiratory: Normal respiratory effort, no distress  Abdomen: Soft, gravid, nontender  Pelvic: Cervical exam performed  Dilation: 3 Effacement (%): 80 Station: -3  Extremities: Edema: Trace  Fetal Status: Fetal Heart Rate (bpm): 157 Fundal Height: 35 cm Movement: Present Presentation: Vertex  Chaperone: [place]    Results for orders placed or performed in visit on 10/21/19 (from the past 24 hour(s))  POC Urinalysis Dipstick OB   Collection Time: 10/21/19  9:59 AM  Result Value Ref Range   Color, UA     Clarity, UA     Glucose, UA Negative Negative   Bilirubin,  UA     Ketones, UA n    Spec Grav, UA     Blood, UA n    pH, UA     POC,PROTEIN,UA Negative Negative, Trace, Small (1+), Moderate (2+), Large (3+), 4+   Urobilinogen, UA     Nitrite, UA n    Leukocytes, UA Negative Negative   Appearance     Odor      Assessment & Plan:  1) Low-risk pregnancy [id number] at [redacted]w[redacted]d with an Estimated Date of Delivery: 11/20/19   2) Preterm cervical change, uc's better right now, increase water intake, reviewed ptl s/s, reasons to seek care   Meds: No orders of the defined types were placed in this encounter.  Labs/procedures today: gbs, gc/ct, sve  Plan:  Continue routine obstetrical care  Next visit: prefers in person    Reviewed: Preterm labor symptoms and general obstetric precautions including but not limited to vaginal bleeding, contractions, leaking of fluid and fetal movement were reviewed in detail with the patient.  All questions were answered.   Follow-up: Return in about 1 week (around 10/28/2019) for [place], in person, CNM.  Orders Placed This Encounter  Procedures  . Strep Gp B NAA  . POC Urinalysis Dipstick OB   Roma Schanz CNM, [facility] 10/21/2019 10:44 AM

## 2019-10-22 LAB — CERVICOVAGINAL ANCILLARY ONLY
Chlamydia: NEGATIVE
Comment: NEGATIVE
Comment: NORMAL
Neisseria Gonorrhea: NEGATIVE

## 2019-10-23 LAB — STREP GP B NAA: Strep Gp B NAA: NEGATIVE

## 2019-10-23 LAB — SPECIMEN STATUS REPORT

## 2019-10-24 ENCOUNTER — Ambulatory Visit (HOSPITAL_COMMUNITY)
Admission: RE | Admit: 2019-10-24 | Discharge: 2019-10-24 | Disposition: A | Discharge status: Home / Self Care | Payer: 59 | Source: Ambulatory Visit | Attending: Obstetrics and Gynecology | Admitting: Obstetrics and Gynecology

## 2019-10-24 ENCOUNTER — Other Ambulatory Visit: Payer: Self-pay

## 2019-10-24 ENCOUNTER — Encounter: Payer: Self-pay | Admitting: Women's Health

## 2019-10-24 ENCOUNTER — Ambulatory Visit (HOSPITAL_COMMUNITY): Payer: 59 | Admitting: *Deleted

## 2019-10-24 ENCOUNTER — Ambulatory Visit (INDEPENDENT_AMBULATORY_CARE_PROVIDER_SITE_OTHER): Payer: 59 | Admitting: Women's Health

## 2019-10-24 ENCOUNTER — Encounter: Payer: 59 | Admitting: Women's Health

## 2019-10-24 VITALS — BP 130/83 | HR 89 | Wt 186.8 lb

## 2019-10-24 DIAGNOSIS — O26893 Other specified pregnancy related conditions, third trimester: Secondary | ICD-10-CM

## 2019-10-24 DIAGNOSIS — O26899 Other specified pregnancy related conditions, unspecified trimester: Secondary | ICD-10-CM

## 2019-10-24 DIAGNOSIS — Z3A36 36 weeks gestation of pregnancy: Secondary | ICD-10-CM

## 2019-10-24 DIAGNOSIS — O358XX0 Maternal care for other (suspected) fetal abnormality and damage, not applicable or unspecified: Secondary | ICD-10-CM | POA: Insufficient documentation

## 2019-10-24 DIAGNOSIS — Z3483 Encounter for supervision of other normal pregnancy, third trimester: Secondary | ICD-10-CM | POA: Diagnosis not present

## 2019-10-24 DIAGNOSIS — O365930 Maternal care for other known or suspected poor fetal growth, third trimester, not applicable or unspecified: Secondary | ICD-10-CM | POA: Diagnosis not present

## 2019-10-24 DIAGNOSIS — R102 Pelvic and perineal pain unspecified side: Secondary | ICD-10-CM

## 2019-10-24 DIAGNOSIS — O35HXX0 Maternal care for other (suspected) fetal abnormality and damage, fetal lower extremities anomalies, not applicable or unspecified: Secondary | ICD-10-CM

## 2019-10-24 DIAGNOSIS — Z3A33 33 weeks gestation of pregnancy: Secondary | ICD-10-CM | POA: Diagnosis not present

## 2019-10-24 DIAGNOSIS — Z1389 Encounter for screening for other disorder: Secondary | ICD-10-CM

## 2019-10-24 DIAGNOSIS — Z331 Pregnant state, incidental: Secondary | ICD-10-CM

## 2019-10-24 LAB — POCT URINALYSIS DIPSTICK OB
Blood, UA: NEGATIVE
Ketones, UA: NEGATIVE
Leukocytes, UA: NEGATIVE
Nitrite, UA: NEGATIVE
POC,PROTEIN,UA: NEGATIVE

## 2019-10-24 IMAGING — US US MFM OB DETAIL+14 WK
1 series · 12 of 28 positions shown · non-contrast
Comparison: none

[Series 1: us mfm ob detail+14 wk · 64 acquisitions, 12 frames shown]
[im 3/64]
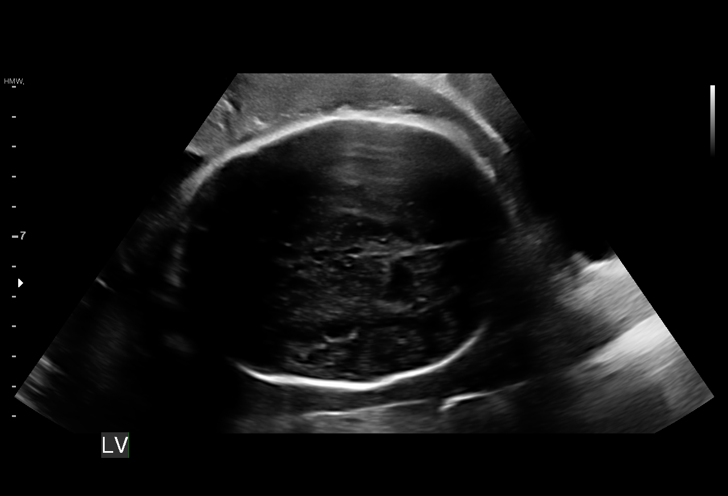
[im 8/64]
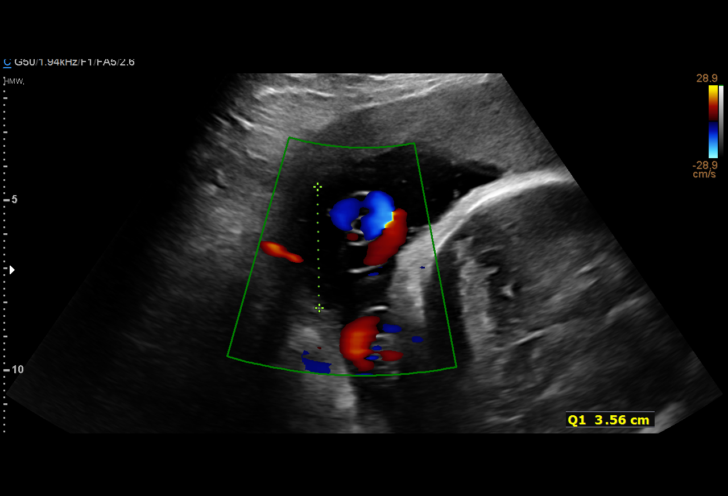
[im 12/64]
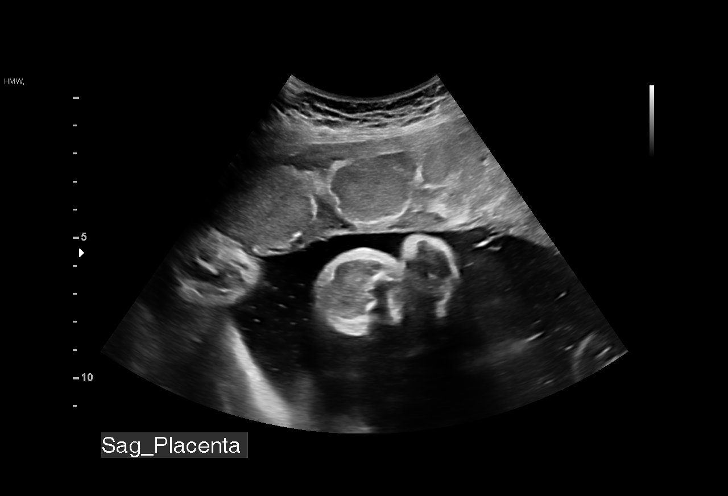
[im 19/64]
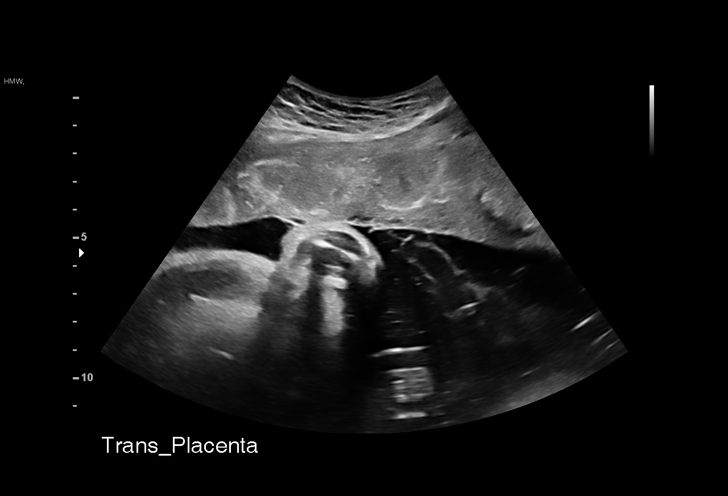
[im 24/64]
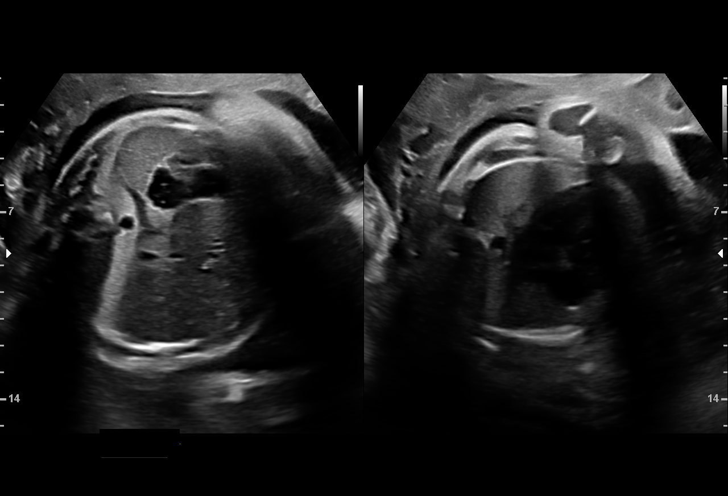
[im 29/64]
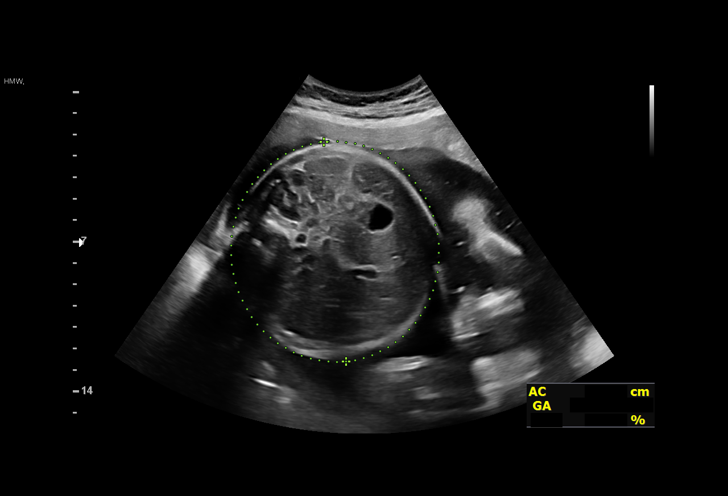
[im 36/64]
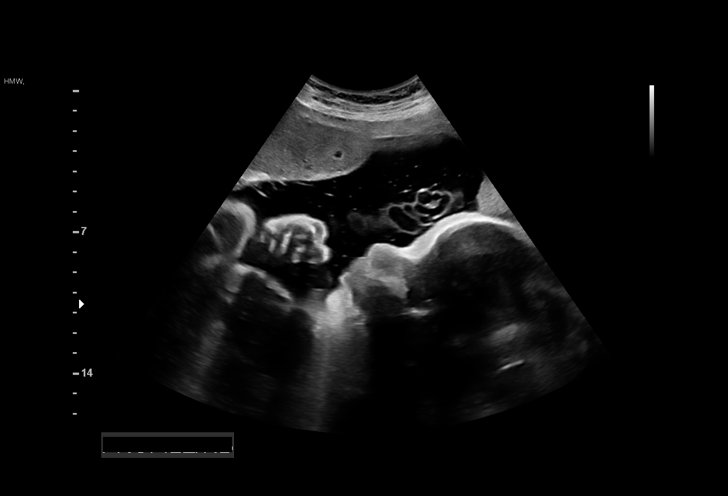
[im 40/64]
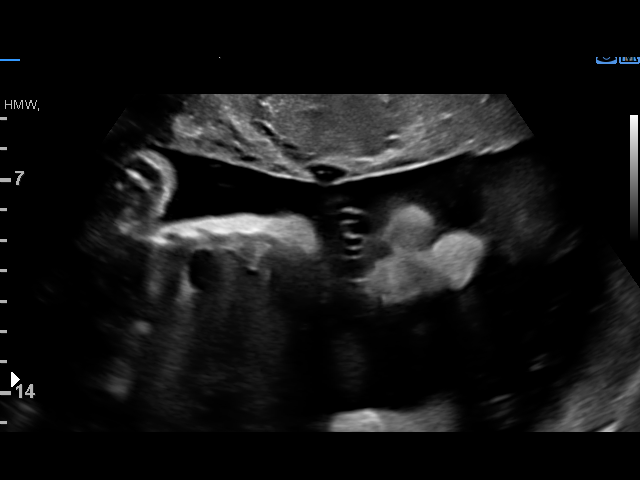
[im 45/64]
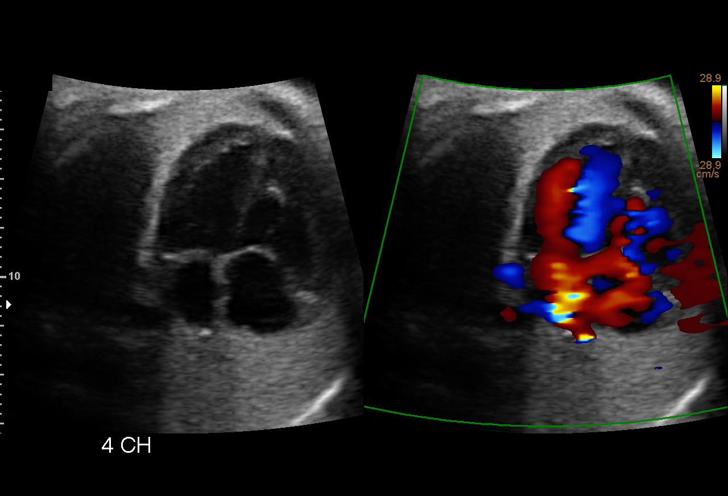
[im 52/64]
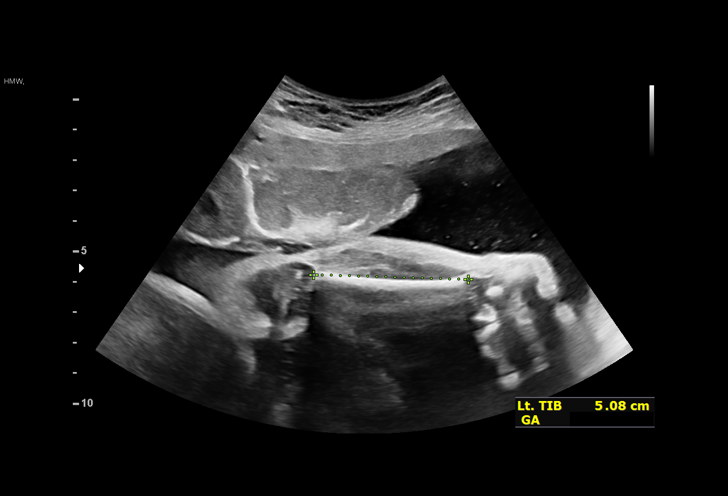
[im 57/64]
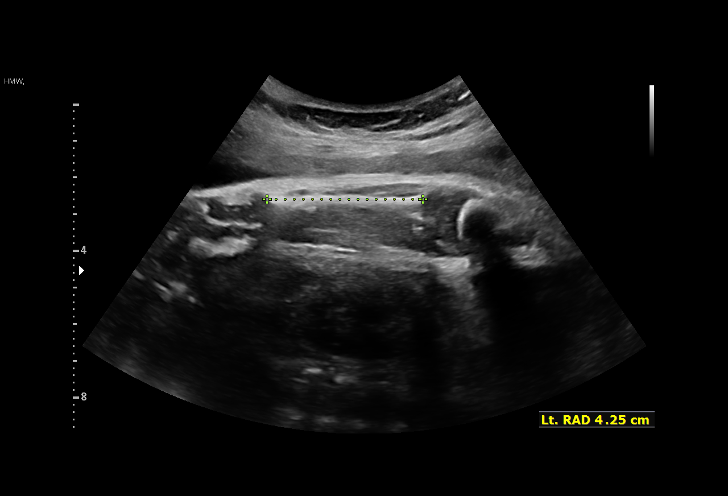
[im 61/64]
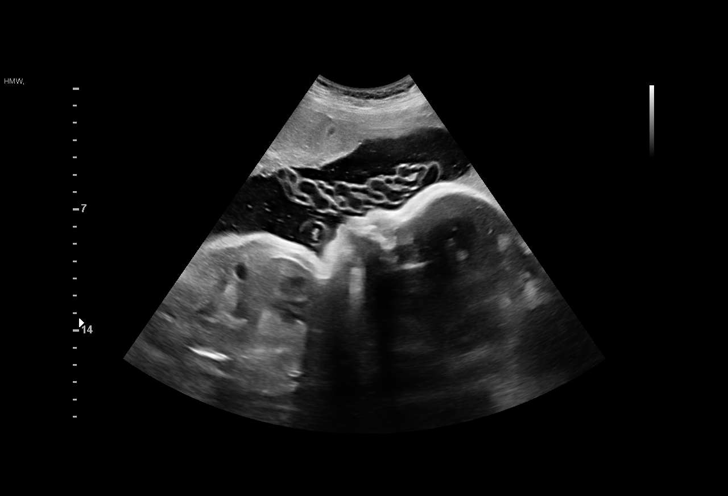

[12 of 28 positions shown; findings below may reference images not displayed]

Hospital

 Ref. Address:     [HOSPITAL]. #B
                   [HOSPITAL],[HOSPITAL]
                   [XB]

 ----------------------------------------------------------------------

 ----------------------------------------------------------------------
Indications

  Maternal care for known or suspected poor      [XB]
  fetal growth, third trimester, not applicable or
  unspecified IUGR
  Encounter for antenatal screening for          [XB]
  malformations (neg [XB])
  36 weeks gestation of pregnancy
 ----------------------------------------------------------------------
Fetal Evaluation

 Num Of Fetuses:         1
 Fetal Heart Rate(bpm):  125
 Cardiac Activity:       Observed
 Presentation:           Cephalic
 Placenta:               Anterior
 P. Cord Insertion:      Visualized, central

 Amniotic Fluid
 AFI FV:      Within normal limits

 AFI Sum(cm)     %Tile       Largest Pocket(cm)
 18.23           68

 RUQ(cm)       RLQ(cm)       LUQ(cm)        LLQ(cm)

Biometry
 BPD:      90.6  mm     G. Age:  36w 5d         75  %    CI:        71.06   %    70 - 86
                                                         FL/HC:      17.2   %    20.1 -
 HC:      342.4  mm     G. Age:  39w 3d         91  %    HC/AC:      1.09        0.93 -
 AC:      314.1  mm     G. Age:  35w 2d         37  %    FL/BPD:     65.1   %    71 - 87
 FL:         59  mm     G. Age:  30w 5d        < 1  %    FL/AC:      18.8   %    20 - 24
 HUM:      51.3  mm     G. Age:  30w 0d        < 5  %

  RIGHT
 ULN:      47.7  mm     G. Age:  30w 3d        < 5  %
 TIB:      51.3  mm     G. Age:  30w 4d        < 5  %
 RAD:      43.6  mm     G. Age:  30w 4d         19  %
 FIB:      50.6  mm     G. Age:  30w 6d        < 5  %
  LEFT
 HUM:      51.2  mm     G. Age:  30w 0d        < 5  %
 ULN:      48.2  mm     G. Age:  30w 5d        < 5  %
 TIB:      50.8  mm     G. Age:  30w 2d        < 5  %
 RAD:      42.5  mm     G. Age:  29w 4d         11  %
 FIB:      47.7  mm     G. Age:  29w 2d        < 5  %

 Est. FW:    [XB]  gm      5 lb 8 oz     16  %
OB History

 Gravidity:    4         Term:   1        Prem:   1        SAB:   1
 TOP:          0       Ectopic:  0        Living: 2
Gestational Age

 LMP:           36w 1d        Date:  [DATE]                 EDD:   [DATE]
 U/S Today:     35w 4d                                        EDD:   [DATE]
 Best:          36w 1d     Det. By:  LMP  ([DATE])          EDD:   [DATE]
Anatomy

 Cranium:               Appears normal         Aortic Arch:            Not well visualized
 Cavum:                 Appears normal         Ductal Arch:            Not well visualized
 Ventricles:            Appears normal         Diaphragm:              Appears normal
 Choroid Plexus:        Not well visualized    Stomach:                Appears normal, left
                                                                       sided
 Cerebellum:            Not well visualized    Abdomen:                Appears normal
 Posterior Fossa:       Not well visualized    Abdominal Wall:         Not well visualized
 Nuchal Fold:           Not applicable (>20    Cord Vessels:           Appears normal (3
                        wks GA)                                        vessel cord)
 Face:                  Appears normal         Kidneys:                Appear normal
                        (orbits and profile)
 Lips:                  Appears normal         Bladder:                Appears normal
 Thoracic:              Appears normal         Spine:                  Not well visualized
 Heart:                 Appears normal         Upper Extremities:      Visualized
                        (4CH, axis, and
                        situs)
 RVOT:                  Not well visualized    Lower Extremities:      Visualized
 LVOT:                  Appears normal

 Other:  Technically difficult due to advanced gestational age.
Cervix Uterus Adnexa
 Cervix
 Not visualized (advanced GA >[XB])

 Uterus
 No abnormality visualized.

 Left Ovary
 No adnexal mass visualized.

 Right Ovary
 Not visualized.

 Cul De Sac
 No free fluid seen.

 Adnexa
 No adnexal mass visualized.
Comments

 This patient was seen for an ultrasound today as the fetal
 long bones have been measuring short during ultrasounds
 performed at [HOSPITAL].  The patient denies any problems
 in her current pregnancy and denies any significant past
 medical history.  The patient reports that both she and her
 husband are short.  Their son is also short.  On exam
 performed this morning, the patient reports that her cervix is
 already 3 cm dilated.

 She had a cell free DNA test earlier in her pregnancy which
 indicated a low risk for trisomy 21, 18, and 13.

 On today's exam, the overall EFW obtained today is
 appropriate for her gestational age.  However, the fetal long
 bones (the femur and humerus lengths) measured short.
 There was normal amniotic fluid noted.

 Vigorous fetal movements were noted on today's exam.
 Doppler studies of the umbilical arteries performed today
 showed a normal S/D ratio of 2.30.

 The patient was advised that hopefully the long bones are
 short due to genetics.  I do not suspect that the fetus is
 growth restricted based on today's ultrasound findings.
 The small association between short and long bones and
 fetal aneuploidy was also discussed.  The patient was
 reassured that her risk of having a baby with Down syndrome
 is low based on her cell free DNA test which indicated a low
 risk.

 Follow-up as indicated.

## 2019-10-24 NOTE — Patient Instructions (Signed)
Cristina Davenport, I greatly value your feedback.  If you receive a survey following your visit with [place] today, we appreciate you taking the time to fill it out.  Thanks, [name], CNM, [facility]  [place]!!! It is now [place] at [facility] ([place], [place] [address]) Entrance located off of [place] parking   Go to [email or web address] to register for FREE online childbirth classes    Call the office [phone]) or go to [facility] if:  You begin to have strong, frequent contractions  Your water breaks.  Sometimes it is a big gush of fluid, sometimes it is just a trickle that keeps getting your panties wet or running down your legs  You have vaginal bleeding.  It is normal to have a small amount of spotting if your cervix was checked.   You don't feel your baby moving like normal.  If you don't, get you something to eat and drink and lay down and focus on feeling your baby move.  You should feel at least 10 movements in 2 hours.  If you don't, you should call the office or go to [facility].   Call the office [phone]) or go to [facility] hospital for these signs of pre-eclampsia:  Severe headache that does not go away with Tylenol  Visual changes- seeing spots, double, blurred vision  Pain under your right breast or upper abdomen that does not go away with Tums or heartburn medicine  Nausea and/or vomiting  Severe swelling in your hands, feet, and face      Home Blood Pressure Monitoring for Patients   Your provider has recommended that you check your blood pressure (BP) at least once a week at home. If you do not have a blood pressure cuff at home, one will be provided for you. Contact your provider if you have not received your monitor within 1 week.   Helpful Tips for Accurate Home Blood Pressure Checks  . Don't smoke, exercise, or drink caffeine 30 minutes before checking your BP . Use the  restroom before checking your BP (a full bladder can raise your pressure) . Relax in a comfortable upright chair . Feet on the ground . Left arm resting comfortably on a flat surface at the level of your heart . Legs uncrossed . Back supported . Sit quietly and don't talk . Place the cuff on your bare arm . Adjust snuggly, so that only two fingertips can fit between your skin and the top of the cuff . Check 2 readings separated by at least one minute . Keep a log of your BP readings . For a visual, please reference this diagram: http://ccnc.care/bpdiagram  Provider Name: [facility]     Phone: [phone]  Zone 1: ALL CLEAR  Continue to monitor your symptoms:  . BP reading is less than 140 (top number) or less than 90 (bottom number)  . No right upper stomach pain . No headaches or seeing spots . No feeling nauseated or throwing up . No swelling in face and hands  Zone 2: CAUTION Call your doctor's office for any of the following:  . BP reading is greater than 140 (top number) or greater than 90 (bottom number)  . Stomach pain under your ribs in the middle or right side . Headaches or seeing spots . Feeling nauseated or throwing up . Swelling in face and hands  Zone 3: EMERGENCY  Seek  immediate medical care if you have any of the following:  . BP reading is greater than160 (top number) or greater than 110 (bottom number) . Severe headaches not improving with Tylenol . Serious difficulty catching your breath . Any worsening symptoms from Zone 2    Preterm Labor and Birth Information  The normal length of a pregnancy is 39-41 weeks. Preterm labor is when labor starts before 37 completed weeks of pregnancy. What are the risk factors for preterm labor? Preterm labor is more likely to occur in women who:  Have certain infections during pregnancy such as a bladder infection, sexually transmitted infection, or infection inside the uterus (chorioamnionitis).  Have a  shorter-than-normal cervix.  Have gone into preterm labor before.  Have had surgery on their cervix.  Are younger than age 32 or older than age 68.  Are African American.  Are pregnant with twins or multiple babies (multiple gestation).  Take street drugs or smoke while pregnant.  Do not gain enough weight while pregnant.  Became pregnant shortly after having been pregnant. What are the symptoms of preterm labor? Symptoms of preterm labor include:  Cramps similar to those that can happen during a menstrual period. The cramps may happen with diarrhea.  Pain in the abdomen or lower back.  Regular uterine contractions that may feel like tightening of the abdomen.  A feeling of increased pressure in the pelvis.  Increased watery or bloody mucus discharge from the vagina.  Water breaking (ruptured amniotic sac). Why is it important to recognize signs of preterm labor? It is important to recognize signs of preterm labor because babies who are born prematurely may not be fully developed. This can put them at an increased risk for:  Long-term (chronic) heart and lung problems.  Difficulty immediately after birth with regulating body systems, including blood sugar, body temperature, heart rate, and breathing rate.  Bleeding in the brain.  Cerebral palsy.  Learning difficulties.  Death. These risks are highest for babies who are born before 25 weeks of pregnancy. How is preterm labor treated? Treatment depends on the length of your pregnancy, your condition, and the health of your baby. It may involve:  Having a stitch (suture) placed in your cervix to prevent your cervix from opening too early (cerclage).  Taking or being given medicines, such as: ? Hormone medicines. These may be given early in pregnancy to help support the pregnancy. ? Medicine to stop contractions. ? Medicines to help mature the baby's lungs. These may be prescribed if the risk of delivery is high. ?  Medicines to prevent your baby from developing cerebral palsy. If the labor happens before 34 weeks of pregnancy, you may need to stay in the hospital. What should I do if I think I am in preterm labor? If you think that you are going into preterm labor, call your health care provider right away. How can I prevent preterm labor in future pregnancies? To increase your chance of having a full-term pregnancy:  Do not use any tobacco products, such as cigarettes, chewing tobacco, and e-cigarettes. If you need help quitting, ask your health care provider.  Do not use street drugs or medicines that have not been prescribed to you during your pregnancy.  Talk with your health care provider before taking any herbal supplements, even if you have been taking them regularly.  Make sure you gain a healthy amount of weight during your pregnancy.  Watch for infection. If you think that you might have an  infection, get it checked right away.  Make sure to tell your health care provider if you have gone into preterm labor before. This information is not intended to replace advice given to you by your health care provider. Make sure you discuss any questions you have with your health care provider. Document Released: 01/21/2004 Document Revised: 02/22/2019 Document Reviewed: 03/23/2016 Elsevier Patient Education  2020 [employer].

## 2019-10-24 NOTE — Progress Notes (Signed)
Work-in LOW-RISK PREGNANCY VISIT Patient name: Cristina Davenport MRN [id number]  Date of birth: 08/19/1986 Chief Complaint:   work-in-ob (labor check)  History of Present Illness:   Cristina Davenport is a 33 y.o. [id number] female at [redacted]w[redacted]d with an Estimated Date of Delivery: 11/20/19 being seen today for ongoing management of a low-risk pregnancy.  Today she is being seen as a work-in for report of intense pressure, feels like she is 'smuggling a bowling ball'. Contractions last night, irregular this morning. Change in bowel movements since Monday, usually constipated, has had 7-8 since Monday, last night was 'diarrhea-y'. Just doesn't feel well this morning. Headache, hasn't taken anything for it. No fever/chills/cough/sob, etc. Has appt w/ MFM at 2 for u/s.  Contractions: Irregular.  .  Movement: Present. denies leaking of fluid. Review of Systems:   Pertinent items are noted in HPI Denies abnormal vaginal discharge w/ itching/odor/irritation, headaches, visual changes, shortness of breath, chest pain, abdominal pain, severe nausea/vomiting, or problems with urination or bowel movements unless otherwise stated above. Pertinent History Reviewed:  Reviewed past medical,surgical, social, obstetrical and family history.  Reviewed problem list, medications and allergies. Physical Assessment:   Vitals:   10/24/19 1042  BP: 130/83  Pulse: 89  Weight: 186 lb 12.8 oz (84.7 kg)  Body mass index is 35.3 kg/m.        Physical Examination:   General appearance: Well appearing, and in no distress  Mental status: Alert, oriented to person, place, and time  Skin: Warm & dry  Cardiovascular: Normal heart rate noted  Respiratory: Normal respiratory effort, no distress  Abdomen: Soft, gravid, nontender  Pelvic: Cervical exam performed         Extremities: Edema: Trace  Fetal Status:     Movement: Present    Chaperone: Peggy Dones    Results for orders placed or performed in visit on 10/24/19 (from the  past 24 hour(s))  POC Urinalysis Dipstick OB   Collection Time: 10/24/19 10:47 AM  Result Value Ref Range   Color, UA     Clarity, UA     Glucose, UA Small (1+) (A) Negative   Bilirubin, UA     Ketones, UA neg    Spec Grav, UA     Blood, UA neg    pH, UA     POC,PROTEIN,UA Negative Negative, Trace, Small (1+), Moderate (2+), Large (3+), 4+   Urobilinogen, UA     Nitrite, UA neg    Leukocytes, UA Negative Negative   Appearance     Odor      Assessment & Plan:  1) Low-risk pregnancy [id number] at [redacted]w[redacted]d with an Estimated Date of Delivery: 11/20/19   2) Pressure, no change in cervix, vtx ballotable  3) High-normal bp> no proteinuria, does have headache this am, hasn't taken anything for it yet. To take apap, if not improved, let MFM know when she goes there for u/s, let her know they will also check her bp there. Check bp daily, let us know if >140/90   Meds: No orders of the defined types were placed in this encounter.  Labs/procedures today: sve  Plan:  Continue routine obstetrical care  Next visit: prefers in person    Reviewed: Preterm labor symptoms and general obstetric precautions including but not limited to vaginal bleeding, contractions, leaking of fluid and fetal movement were reviewed in detail with the patient.  All questions were answered. Has home bp cuff. Check bp daily, let us know if >140/90.  Follow-up: Return for As scheduled.  Orders Placed This Encounter  Procedures  . POC Urinalysis Dipstick OB   Roma Schanz CNM, [facility] 10/24/2019 11:19 AM

## 2019-10-28 ENCOUNTER — Encounter: Payer: Self-pay | Admitting: Advanced Practice Midwife

## 2019-10-28 ENCOUNTER — Ambulatory Visit (INDEPENDENT_AMBULATORY_CARE_PROVIDER_SITE_OTHER): Payer: 59 | Admitting: Advanced Practice Midwife

## 2019-10-28 ENCOUNTER — Other Ambulatory Visit: Payer: Self-pay

## 2019-10-28 VITALS — BP 126/81 | HR 83 | Wt 186.5 lb

## 2019-10-28 DIAGNOSIS — Z331 Pregnant state, incidental: Secondary | ICD-10-CM

## 2019-10-28 DIAGNOSIS — Z1389 Encounter for screening for other disorder: Secondary | ICD-10-CM

## 2019-10-28 DIAGNOSIS — Z3483 Encounter for supervision of other normal pregnancy, third trimester: Secondary | ICD-10-CM

## 2019-10-28 DIAGNOSIS — Z3A36 36 weeks gestation of pregnancy: Secondary | ICD-10-CM

## 2019-10-28 LAB — POCT URINALYSIS DIPSTICK OB
Blood, UA: NEGATIVE
Glucose, UA: NEGATIVE
Ketones, UA: NEGATIVE
Leukocytes, UA: NEGATIVE
Nitrite, UA: NEGATIVE
POC,PROTEIN,UA: NEGATIVE

## 2019-10-28 NOTE — Progress Notes (Signed)
LOW-RISK PREGNANCY VISIT Patient name: Cristina Davenport MRN [id number]  Date of birth: February 27, 1986 Chief Complaint:   Routine Prenatal Visit (+ cramping and contractions)  History of Present Illness:   Cristina Davenport is a 33 y.o. [id number] female at [redacted]w[redacted]d with an Estimated Date of Delivery: 11/20/19 being seen today for ongoing management of a low-risk pregnancy.  Today she reports pelvic pressure. Contractions: Irregular. Vag. Bleeding: None.  Movement: Present. denies leaking of fluid. Review of Systems:   Pertinent items are noted in HPI Denies abnormal vaginal discharge w/ itching/odor/irritation, headaches, visual changes, shortness of breath, chest pain, abdominal pain, severe nausea/vomiting, or problems with urination or bowel movements unless otherwise stated above.  Pertinent History Reviewed:  Medical & Surgical Hx:   Past Medical History:  Diagnosis Date  . Anxiety   . Anxiety   . Bipolar 1 disorder ([place])   . GERD (gastroesophageal reflux disease)   . Hypertension    PIH  . IBS (irritable bowel syndrome)   . Migraine without aura    Past Surgical History:  Procedure Laterality Date  . ABDOMINAL SURGERY     cyst removed   . CHOLECYSTECTOMY N/A 07/10/2013   Procedure: LAPAROSCOPIC CHOLECYSTECTOMY;  Surgeon: Donato Heinz, MD;  Location: [facility];  Service: General;  Laterality: N/A;  . ESOPHAGOGASTRODUODENOSCOPY N/A 04/30/2013   [id number] distal esophagus of uncertain clinical significance-status post biopsy. Tiny hiatal hernia. No explanation  . EXAMINATION UNDER ANESTHESIA  04/21/2012   Gluteal wound repair  . WISDOM TOOTH EXTRACTION     Family History  Adopted: Yes  Problem Relation Age of Onset  . Mental illness Mother   . Cancer Father        skin cancer  . Hypertension Maternal Grandmother   . Stroke Maternal Grandmother   . Kidney failure Maternal Grandmother   . Hypertension Maternal Grandfather   . Stroke Maternal Grandfather   . Cancer Maternal  Grandfather        skin  . Crohn's disease Cousin   . Colon cancer Neg Hx     Current Outpatient Medications:  .  Blood Pressure Monitor MISC, For regular home bp monitoring during pregnancy, Disp: 1 each, Rfl: 0 .  ondansetron (ZOFRAN) 4 MG tablet, Take 1 tablet (4 mg total) by mouth every 8 (eight) hours as needed for nausea or vomiting., Disp: 20 tablet, Rfl: 0 .  Prenatal Vit-Fe Fumarate-FA (PRENATAL MULTIVITAMIN) TABS tablet, Take 1 tablet by mouth daily at 12 noon., Disp: , Rfl:  .  promethazine (PHENERGAN) 25 MG tablet, Take 1 tablet (25 mg total) by mouth at bedtime., Disp: 30 tablet, Rfl: 2 .  sertraline (ZOLOFT) 100 MG tablet, TAKE 1 & 1/2 (ONE & ONE-HALF) TABLETS BY MOUTH ONCE DAILY, Disp: 135 tablet, Rfl: 1 .  valACYclovir (VALTREX) 1000 MG tablet, Take 2 tablets now,then two tablets 12 hours later, Disp: 4 tablet, Rfl: 5 Social History: Reviewed -  reports that she quit smoking about 5 years ago. Her smoking use included cigarettes. She smoked 0.00 packs per day for 8.00 years. She has never used smokeless tobacco.  Physical Assessment:   Vitals:   10/28/19 0852  BP: 126/81  Pulse: 83  Weight: 186 lb 8 oz (84.6 kg)  Body mass index is 35.24 kg/m.        Physical Examination:   General appearance: Well appearing, and in no distress  Mental status: Alert, oriented to person, place, and time  Skin: Warm & dry  Cardiovascular:  Normal heart rate noted  Respiratory: Normal respiratory effort, no distress  Abdomen: Soft, gravid, nontender  Pelvic: Cervical exam performed  Dilation: 3 Effacement (%): 70 Station: Ballotable  Extremities: Edema: Trace  Fetal Status: Fetal Heart Rate (bpm): 141 Fundal Height: 35 cm Movement: Present Presentation: Vertex  Results for orders placed or performed in visit on 10/28/19 (from the past 24 hour(s))  POC Urinalysis Dipstick OB   Collection Time: 10/28/19  8:54 AM  Result Value Ref Range   Color, UA     Clarity, UA     Glucose, UA  Negative Negative   Bilirubin, UA     Ketones, UA neg    Spec Grav, UA     Blood, UA neg    pH, UA     POC,PROTEIN,UA Negative Negative, Trace, Small (1+), Moderate (2+), Large (3+), 4+   Urobilinogen, UA     Nitrite, UA neg    Leukocytes, UA Negative Negative   Appearance     Odor      Assessment & Plan:  1) Low-risk pregnancy [id number] at [redacted]w[redacted]d with an Estimated Date of Delivery: 11/20/19     Labs/procedures/US today: none  Plan:  Continue routine obstetrical care    Follow-up: Return in about 1 week (around 11/04/2019) for OB Mychart visit.  Orders Placed This Encounter  Procedures  . POC Urinalysis Dipstick OB   Christin Fudge CNM 10/28/2019 9:18 AM

## 2019-10-28 NOTE — Patient Instructions (Signed)

## 2019-11-02 ENCOUNTER — Inpatient Hospital Stay (HOSPITAL_COMMUNITY)
Admission: AD | Admit: 2019-11-02 | Discharge: 2019-11-02 | Disposition: A | Discharge status: Home / Self Care | Payer: 59 | Attending: Obstetrics & Gynecology | Admitting: Obstetrics & Gynecology

## 2019-11-02 ENCOUNTER — Encounter (HOSPITAL_COMMUNITY): Payer: Self-pay | Admitting: Obstetrics & Gynecology

## 2019-11-02 ENCOUNTER — Other Ambulatory Visit: Payer: Self-pay

## 2019-11-02 DIAGNOSIS — Z3A37 37 weeks gestation of pregnancy: Secondary | ICD-10-CM | POA: Insufficient documentation

## 2019-11-02 DIAGNOSIS — M7918 Myalgia, other site: Secondary | ICD-10-CM | POA: Diagnosis not present

## 2019-11-02 DIAGNOSIS — Z3689 Encounter for other specified antenatal screening: Secondary | ICD-10-CM

## 2019-11-02 DIAGNOSIS — F4322 Adjustment disorder with anxiety: Secondary | ICD-10-CM

## 2019-11-02 DIAGNOSIS — Z87891 Personal history of nicotine dependence: Secondary | ICD-10-CM | POA: Diagnosis not present

## 2019-11-02 DIAGNOSIS — Z881 Allergy status to other antibiotic agents status: Secondary | ICD-10-CM | POA: Insufficient documentation

## 2019-11-02 DIAGNOSIS — Z91040 Latex allergy status: Secondary | ICD-10-CM | POA: Insufficient documentation

## 2019-11-02 DIAGNOSIS — O99893 Other specified diseases and conditions complicating puerperium: Secondary | ICD-10-CM | POA: Insufficient documentation

## 2019-11-02 DIAGNOSIS — R109 Unspecified abdominal pain: Secondary | ICD-10-CM | POA: Diagnosis present

## 2019-11-02 LAB — CBC
HCT: 36.8 % (ref 36.0–46.0)
Hemoglobin: 12.3 g/dL (ref 12.0–15.0)
MCH: 29.9 pg (ref 26.0–34.0)
MCHC: 33.4 g/dL (ref 30.0–36.0)
MCV: 89.3 fL (ref 80.0–100.0)
Platelets: 182 10*3/uL (ref 150–400)
RBC: 4.12 MIL/uL (ref 3.87–5.11)
RDW: 13.2 % (ref 11.5–15.5)
WBC: 10.5 10*3/uL (ref 4.0–10.5)
nRBC: 0 % (ref 0.0–0.2)

## 2019-11-02 LAB — URINALYSIS, ROUTINE W REFLEX MICROSCOPIC
Bilirubin Urine: NEGATIVE
Glucose, UA: >=500 mg/dL — AB
Hgb urine dipstick: NEGATIVE
Ketones, ur: NEGATIVE mg/dL
Leukocytes,Ua: NEGATIVE
Nitrite: NEGATIVE
Protein, ur: NEGATIVE mg/dL
Specific Gravity, Urine: 1.016 (ref 1.005–1.030)
pH: 6 (ref 5.0–8.0)

## 2019-11-02 NOTE — MAU Provider Note (Signed)
History     CSN: [id number]  Arrival date and time: 11/02/19 2121   First Provider Initiated Contact with Patient 11/02/19 2315     Chief Complaint  Patient presents with  . Abdominal Pain   HPI Cristina Davenport is a 33 y.o. [id number] at [redacted]w[redacted]d who presents to MAU with chief complaint of abdominal pain. This is a new problem, onset yesterday and gradually improving. Patient's pain is "stabbing" and "stretching", located at her right mid-abdomen. Her pain radiates bilaterally to her lower back. She rates her pain 10/10 yesterday, 5/10 on arrival in MAU. She has not taken medication or tried other treatments for this complaint. She declines pain medication in MAU.  Patient states she was on the floor wrapping presents when she first noticed the discomfort. Small amount of relief with sitting, reclining slightly. She denies dysuria, flank pain, vaginal bleeding, leaking of fluid, decreased fetal movement, fever, falls, or recent illness.    OB History    Gravida  4   Para  2   Term  1   Preterm  1   AB  1   Living  2     SAB  1   TAB  0   Ectopic  0   Multiple  0   Live Births  2          Patient Active Problem List   Diagnosis Date Noted  . Short femur of fetus on prenatal ultrasound 08/30/2019  . Genital warts 06/21/2019  . Supervision of normal pregnancy 05/10/2019  . HSV-1 infection 05/10/2019  . Post partum depression 01/05/2018  . Nausea without vomiting 08/09/2017  . Hx of preeclampsia, prior pregnancy, currently pregnant 02/16/2017  . Constipation 03/14/2016  . Abdominal bloating 03/14/2016  . Migraine headache 03/12/2016  . Anxiety 04/17/2015  . Low grade squamous intraepithelial lesion on cytologic smear of cervix (LGSIL) 09/15/2014  . Adjustment disorder with anxious mood 09/14/2014    Past Medical History:  Diagnosis Date  . Anxiety   . Anxiety   . Bipolar 1 disorder ([place])   . GERD (gastroesophageal reflux disease)   . Hypertension    PIH   . IBS (irritable bowel syndrome)   . Migraine without aura     Past Surgical History:  Procedure Laterality Date  . ABDOMINAL SURGERY     cyst removed   . CHOLECYSTECTOMY N/A 07/10/2013   Procedure: LAPAROSCOPIC CHOLECYSTECTOMY;  Surgeon: Donato Heinz, MD;  Location: [facility];  Service: General;  Laterality: N/A;  . ESOPHAGOGASTRODUODENOSCOPY N/A 04/30/2013   [id number] distal esophagus of uncertain clinical significance-status post biopsy. Tiny hiatal hernia. No explanation  . EXAMINATION UNDER ANESTHESIA  04/21/2012   Gluteal wound repair  . WISDOM TOOTH EXTRACTION      Family History  Adopted: Yes  Problem Relation Age of Onset  . Mental illness Mother   . Cancer Father        skin cancer  . Hypertension Maternal Grandmother   . Stroke Maternal Grandmother   . Kidney failure Maternal Grandmother   . Hypertension Maternal Grandfather   . Stroke Maternal Grandfather   . Cancer Maternal Grandfather        skin  . Crohn's disease Cousin   . Colon cancer Neg Hx     Social History   Tobacco Use  . Smoking status: Former Smoker    Packs/day: 0.00    Years: 8.00    Pack years: 0.00    Types: Cigarettes  Quit date: 09/19/2014    Years since quitting: 5.1  . Smokeless tobacco: Never Used  Substance Use Topics  . Alcohol use: No    Alcohol/week: 0.0 standard drinks  . Drug use: No    Allergies:  Allergies  Allergen Reactions  . Latex Other (See Comments)    General irritation  . Doxycycline Rash    Solar rash    No medications prior to admission.    Review of Systems  Constitutional: Negative for chills, fatigue and fever.  Respiratory: Negative for shortness of breath.   Gastrointestinal: Positive for abdominal pain.  Genitourinary: Negative for difficulty urinating, dysuria, flank pain, vaginal bleeding, vaginal discharge and vaginal pain.  Musculoskeletal: Positive for back pain.  All other systems reviewed and are negative.  Physical Exam   Blood  pressure 126/80, pulse 80, temperature 98 F (36.7 C), temperature source Oral, resp. rate 20, height 5\' 1"  (1.549 m), weight 86.3 kg, last menstrual period 02/13/2019, SpO2 99 %, currently breastfeeding.  Physical Exam  Nursing note and vitals reviewed. Constitutional: She is oriented to person, place, and time. She appears well-developed and well-nourished.  Cardiovascular: Normal rate.  Respiratory: Effort normal and breath sounds normal.  GI: Soft. She exhibits no distension. There is no abdominal tenderness. There is no rebound, no guarding and no CVA tenderness.  Gravid  Genitourinary:    Vagina and uterus normal.     No vaginal discharge.   Neurological: She is alert and oriented to person, place, and time.  Skin: Skin is warm and dry.  Psychiatric: She has a normal mood and affect. Her behavior is normal. Judgment and thought content normal.    MAU Course/MDM  Procedures   --Reactive tracing: baseline 130, mod variability, pos accels, no decels --Toco: irregular contractions --cervix 3/80/-3 unchanged from clinic 10/28/19 --Pertinent negatives: vaginal bleeding, flank pain, CVAT, dysuria, abdominal tenderness --Discussed musculoskeletal pain as contributing factor. Advised reconsideration of pain medication, maternity belt  Patient Vitals for the past 24 hrs:  BP Temp Temp src Pulse Resp SpO2 Height Weight  11/02/19 2333 126/80 98 F (36.7 C) Oral 80 20 99 % -- --  11/02/19 2213 129/84 -- -- 93 20 -- -- --  11/02/19 2152 133/78 98.4 F (36.9 C) Oral 86 20 99 % 5\' 1"  (1.549 m) 86.3 kg   Results for orders placed or performed during the hospital encounter of 11/02/19 (from the past 24 hour(s))  CBC     Status: None   Collection Time: 11/02/19 10:38 PM  Result Value Ref Range   WBC 10.5 4.0 - 10.5 K/uL   RBC 4.12 3.87 - 5.11 MIL/uL   Hemoglobin 12.3 12.0 - 15.0 g/dL   HCT 36.8 36.0 - 46.0 %   MCV 89.3 80.0 - 100.0 fL   MCH 29.9 26.0 - 34.0 pg   MCHC 33.4 30.0 - 36.0  g/dL   RDW 13.2 11.5 - 15.5 %   Platelets 182 150 - 400 K/uL   nRBC 0.0 0.0 - 0.2 %  Urinalysis, Routine w reflex microscopic     Status: Abnormal   Collection Time: 11/02/19 10:44 PM  Result Value Ref Range   Color, Urine YELLOW YELLOW   APPearance HAZY (A) CLEAR   Specific Gravity, Urine 1.016 1.005 - 1.030   pH 6.0 5.0 - 8.0   Glucose, UA >=500 (A) NEGATIVE mg/dL   Hgb urine dipstick NEGATIVE NEGATIVE   Bilirubin Urine NEGATIVE NEGATIVE   Ketones, ur NEGATIVE NEGATIVE mg/dL  Protein, ur NEGATIVE NEGATIVE mg/dL   Nitrite NEGATIVE NEGATIVE   Leukocytes,Ua NEGATIVE NEGATIVE   RBC / HPF 0-5 0 - 5 RBC/hpf   WBC, UA 0-5 0 - 5 WBC/hpf   Bacteria, UA RARE (A) NONE SEEN   Squamous Epithelial / LPF 6-10 0 - 5   Mucus PRESENT    Assessment and Plan  --33 y.o. [id number] at [redacted]w[redacted]d  --Reactive tracing --No concerning findings on physical exam or labs collected in MAU --Discharge home in stable condition  F/U: --[facility] 11/04/2019  Darlina Rumpf, CNM 11/03/2019, 12:06 AM

## 2019-11-02 NOTE — Discharge Instructions (Signed)

## 2019-11-02 NOTE — MAU Note (Addendum)
Patient presents to MAU c/o constant stabbing pain in R side since yesterday. +FM, denies vaginal bleeding or LOF. Patient reports trying to have a BM today and reports increased pressure and it made the pain worse on right side.

## 2019-11-04 ENCOUNTER — Ambulatory Visit (INDEPENDENT_AMBULATORY_CARE_PROVIDER_SITE_OTHER): Payer: 59 | Admitting: *Deleted

## 2019-11-04 ENCOUNTER — Other Ambulatory Visit: Payer: Self-pay

## 2019-11-04 ENCOUNTER — Encounter: Payer: Self-pay | Admitting: Women's Health

## 2019-11-04 ENCOUNTER — Telehealth (INDEPENDENT_AMBULATORY_CARE_PROVIDER_SITE_OTHER): Payer: 59 | Admitting: Women's Health

## 2019-11-04 VITALS — BP 128/82 | HR 78

## 2019-11-04 VITALS — BP 130/92 | HR 81

## 2019-11-04 DIAGNOSIS — Z3483 Encounter for supervision of other normal pregnancy, third trimester: Secondary | ICD-10-CM

## 2019-11-04 DIAGNOSIS — Z0130 Encounter for examination of blood pressure without abnormal findings: Secondary | ICD-10-CM

## 2019-11-04 DIAGNOSIS — R030 Elevated blood-pressure reading, without diagnosis of hypertension: Secondary | ICD-10-CM

## 2019-11-04 DIAGNOSIS — Z3A37 37 weeks gestation of pregnancy: Secondary | ICD-10-CM

## 2019-11-04 DIAGNOSIS — Z331 Pregnant state, incidental: Secondary | ICD-10-CM

## 2019-11-04 DIAGNOSIS — Z1389 Encounter for screening for other disorder: Secondary | ICD-10-CM

## 2019-11-04 LAB — POCT URINALYSIS DIPSTICK OB
Blood, UA: NEGATIVE
Glucose, UA: NEGATIVE
Ketones, UA: NEGATIVE
Leukocytes, UA: NEGATIVE
Nitrite, UA: NEGATIVE
POC,PROTEIN,UA: NEGATIVE

## 2019-11-04 NOTE — Progress Notes (Signed)
    TELEHEALTH VIRTUAL OBSTETRICS VISIT ENCOUNTER NOTE Patient name: Cristina Davenport MRN [id number]  Date of birth: 08/14/1986  I connected with patient on 11/04/19 at  8:50 AM EST by MyChart video  and verified that I am speaking with the correct person using two identifiers. Due to COVID-19 recommendations, pt is not currently in our office.    I discussed the limitations, risks, security and privacy concerns of performing an evaluation and management service by telephone and the availability of in person appointments. I also discussed with the patient that there may be a patient responsible charge related to this service. The patient expressed understanding and agreed to proceed.  Chief Complaint:   Routine Prenatal Visit  History of Present Illness:   Cristina Davenport is a 33 y.o. [phone] female at [redacted]w[redacted]d with an Estimated Date of Delivery: 11/20/19 being evaluated today for ongoing management of a low-risk pregnancy.  Today she reports mild headaches, hasn't had to take anything, no visual changes, no ruq/epigastric pain or n/v. Did have RLQ pain wrapping around to back this weekend, went to MAU and all was normal, it has since resolved. BP there was normal. Does have h/o pre-e. Went to MFM 12/10 as scheduled for EFW u/s, overall EFW 16%, long bones short, likely genetic. Contractions: Irregular. Vag. Bleeding: None.  Movement: Present. denies leaking of fluid. Review of Systems:   Pertinent items are noted in HPI Denies abnormal vaginal discharge w/ itching/odor/irritation, headaches, visual changes, shortness of breath, chest pain, abdominal pain, severe nausea/vomiting, or problems with urination or bowel movements unless otherwise stated above. Pertinent History Reviewed:  Reviewed past medical,surgical, social, obstetrical and family history.  Reviewed problem list, medications and allergies. Physical Assessment:   Vitals:   11/04/19 0852 11/04/19 0854  BP: (!) 138/94 (!) 130/92  Pulse: 79  81  There is no height or weight on file to calculate BMI.        Physical Examination:   General:  Alert, oriented and cooperative.   Mental Status: Normal mood and affect perceived. Normal judgment and thought content.  Rest of physical exam deferred due to type of encounter  No results found for this or any previous visit (from the past 24 hour(s)).  Assessment & Plan:  1) Pregnancy [id number] at [redacted]w[redacted]d with an Estimated Date of Delivery: 11/20/19   2) Elevated bp, h/o pre-e, gave option of coming here for bp check vs. MAU, wants to come here, to bring home cuff w/ her. If elevated here, will go to [facility]   Meds: No orders of the defined types were placed in this encounter.   Labs/procedures today: none  Plan:  Come to office for bp check w/ nurse.   I provided 15 minutes of non-face-to-face time during this encounter.  Follow-up: Return for dependent on bp check w/ nurse today.  No orders of the defined types were placed in this encounter.  [place], [facility] 11/04/2019 9:13 AM

## 2019-11-04 NOTE — Progress Notes (Addendum)
   NURSE VISIT- BLOOD PRESSURE CHECK  SUBJECTIVE:  Cristina Davenport is a 33 y.o. [phone] female here for BP check. She is [redacted]w[redacted]d pregnant    HYPERTENSION ROS:  Pregnant/postpartum:  . Severe headaches that don't go away with tylenol/other medicines: No  . Visual changes (seeing spots/double/blurred vision) No  . Severe pain under right breast breast or in center of upper chest No  . Severe nausea/vomiting No  . Taking medicines as instructed not applicable  .   OBJECTIVE:  BP 128/82   Pulse 78   LMP 02/13/2019 (Exact Date)   BP at same time w/ her home bp cuff 135/90 Appearance alert, well appearing, and in no distress.  ASSESSMENT: Pregnancy [redacted]w[redacted]d  blood pressure check  PLAN: Discussed with [name], CNM, [facility]   Recommendations: no changes needed   Follow-up: 1 week   Rolena Infante  11/04/2019 1:48 PM   Chart reviewed for nurse visit. Agree with plan of care. Pt to have in person visits from now on d/t inaccuracy of her home cuff.  Roma Schanz, [place] 11/04/2019 4:11 PM

## 2019-11-07 ENCOUNTER — Inpatient Hospital Stay (HOSPITAL_COMMUNITY): Payer: 59 | Admitting: Anesthesiology

## 2019-11-07 ENCOUNTER — Other Ambulatory Visit: Payer: Self-pay

## 2019-11-07 ENCOUNTER — Encounter (HOSPITAL_COMMUNITY): Payer: Self-pay | Admitting: Obstetrics and Gynecology

## 2019-11-07 ENCOUNTER — Inpatient Hospital Stay (HOSPITAL_COMMUNITY): Payer: 59

## 2019-11-07 ENCOUNTER — Inpatient Hospital Stay (HOSPITAL_COMMUNITY)
Admission: AD | Admit: 2019-11-07 | Discharge: 2019-11-08 | DRG: 805 | Disposition: A | Discharge status: Home / Self Care | Payer: 59 | Attending: Obstetrics and Gynecology | Admitting: Obstetrics and Gynecology

## 2019-11-07 DIAGNOSIS — Z87891 Personal history of nicotine dependence: Secondary | ICD-10-CM | POA: Diagnosis not present

## 2019-11-07 DIAGNOSIS — O358XX0 Maternal care for other (suspected) fetal abnormality and damage, not applicable or unspecified: Secondary | ICD-10-CM | POA: Diagnosis present

## 2019-11-07 DIAGNOSIS — O9832 Other infections with a predominantly sexual mode of transmission complicating childbirth: Secondary | ICD-10-CM | POA: Diagnosis present

## 2019-11-07 DIAGNOSIS — O4202 Full-term premature rupture of membranes, onset of labor within 24 hours of rupture: Secondary | ICD-10-CM

## 2019-11-07 DIAGNOSIS — O4593 Premature separation of placenta, unspecified, third trimester: Secondary | ICD-10-CM | POA: Diagnosis present

## 2019-11-07 DIAGNOSIS — Z20828 Contact with and (suspected) exposure to other viral communicable diseases: Secondary | ICD-10-CM | POA: Diagnosis present

## 2019-11-07 DIAGNOSIS — Z3A38 38 weeks gestation of pregnancy: Secondary | ICD-10-CM

## 2019-11-07 DIAGNOSIS — O164 Unspecified maternal hypertension, complicating childbirth: Secondary | ICD-10-CM | POA: Diagnosis not present

## 2019-11-07 DIAGNOSIS — O99344 Other mental disorders complicating childbirth: Secondary | ICD-10-CM | POA: Diagnosis present

## 2019-11-07 DIAGNOSIS — O4292 Full-term premature rupture of membranes, unspecified as to length of time between rupture and onset of labor: Principal | ICD-10-CM | POA: Diagnosis present

## 2019-11-07 DIAGNOSIS — A6000 Herpesviral infection of urogenital system, unspecified: Secondary | ICD-10-CM | POA: Diagnosis present

## 2019-11-07 DIAGNOSIS — O35HXX0 Maternal care for other (suspected) fetal abnormality and damage, fetal lower extremities anomalies, not applicable or unspecified: Secondary | ICD-10-CM | POA: Diagnosis present

## 2019-11-07 DIAGNOSIS — O4693 Antepartum hemorrhage, unspecified, third trimester: Secondary | ICD-10-CM

## 2019-11-07 DIAGNOSIS — F419 Anxiety disorder, unspecified: Secondary | ICD-10-CM | POA: Diagnosis present

## 2019-11-07 DIAGNOSIS — O09299 Supervision of pregnancy with other poor reproductive or obstetric history, unspecified trimester: Secondary | ICD-10-CM

## 2019-11-07 DIAGNOSIS — B009 Herpesviral infection, unspecified: Secondary | ICD-10-CM | POA: Diagnosis present

## 2019-11-07 DIAGNOSIS — Z3490 Encounter for supervision of normal pregnancy, unspecified, unspecified trimester: Secondary | ICD-10-CM

## 2019-11-07 LAB — CBC
HCT: 36.1 % (ref 36.0–46.0)
Hemoglobin: 11.9 g/dL — ABNORMAL LOW (ref 12.0–15.0)
MCH: 29.7 pg (ref 26.0–34.0)
MCHC: 33 g/dL (ref 30.0–36.0)
MCV: 90 fL (ref 80.0–100.0)
Platelets: 184 10*3/uL (ref 150–400)
RBC: 4.01 MIL/uL (ref 3.87–5.11)
RDW: 13.1 % (ref 11.5–15.5)
WBC: 10.6 10*3/uL — ABNORMAL HIGH (ref 4.0–10.5)
nRBC: 0 % (ref 0.0–0.2)

## 2019-11-07 LAB — POCT FERN TEST: POCT Fern Test: POSITIVE

## 2019-11-07 LAB — RPR: RPR Ser Ql: NONREACTIVE

## 2019-11-07 LAB — TYPE AND SCREEN
ABO/RH(D): A POS
Antibody Screen: NEGATIVE

## 2019-11-07 LAB — RESPIRATORY PANEL BY RT PCR (FLU A&B, COVID)
Influenza A by PCR: NEGATIVE
Influenza B by PCR: NEGATIVE
SARS Coronavirus 2 by RT PCR: NEGATIVE

## 2019-11-07 LAB — ABO/RH: ABO/RH(D): A POS

## 2019-11-07 IMAGING — US US MFM OB LIMITED
1 series · 8 of 8 positions shown · non-contrast
Comparison: none

[Series 1: us mfm ob limited · 8 of 8 slices shown]
[im 1/8]
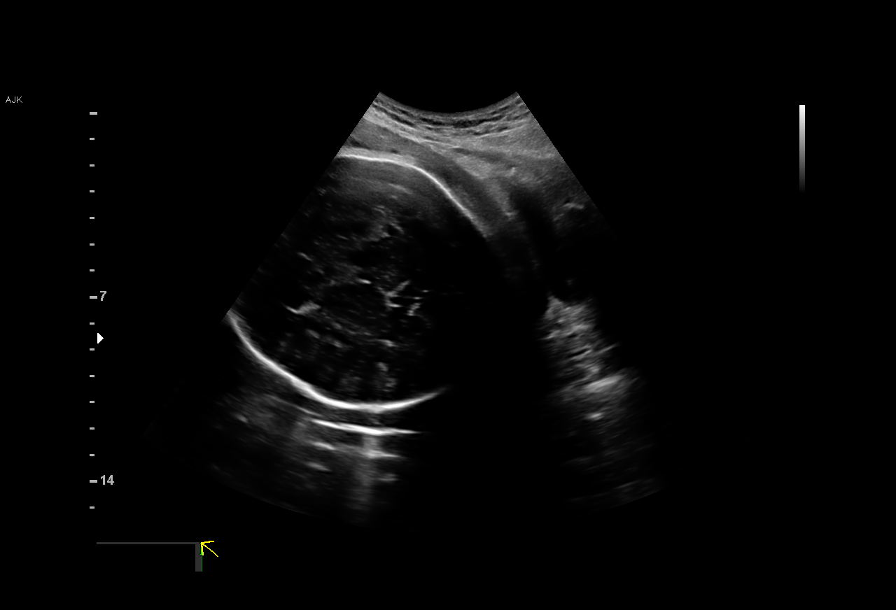
[im 2/8]
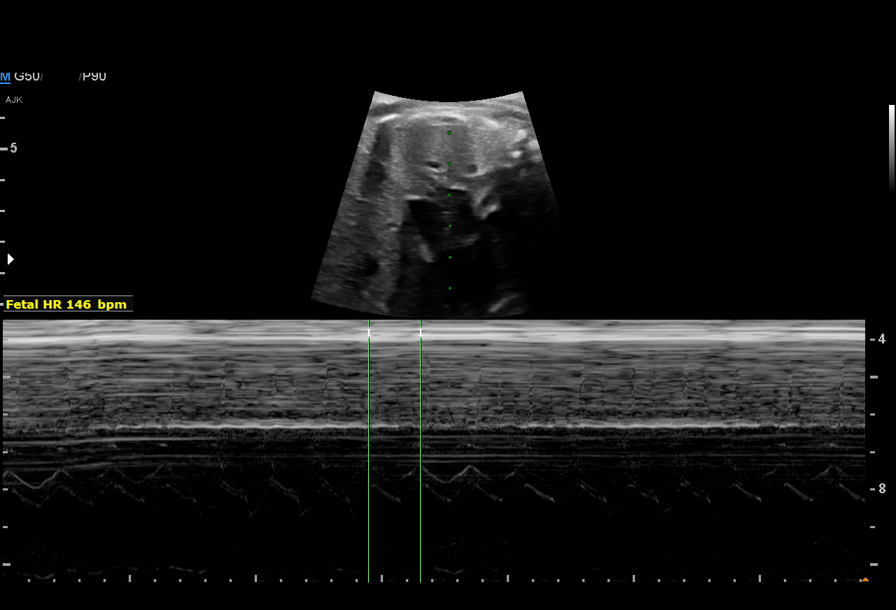
[im 3/8]
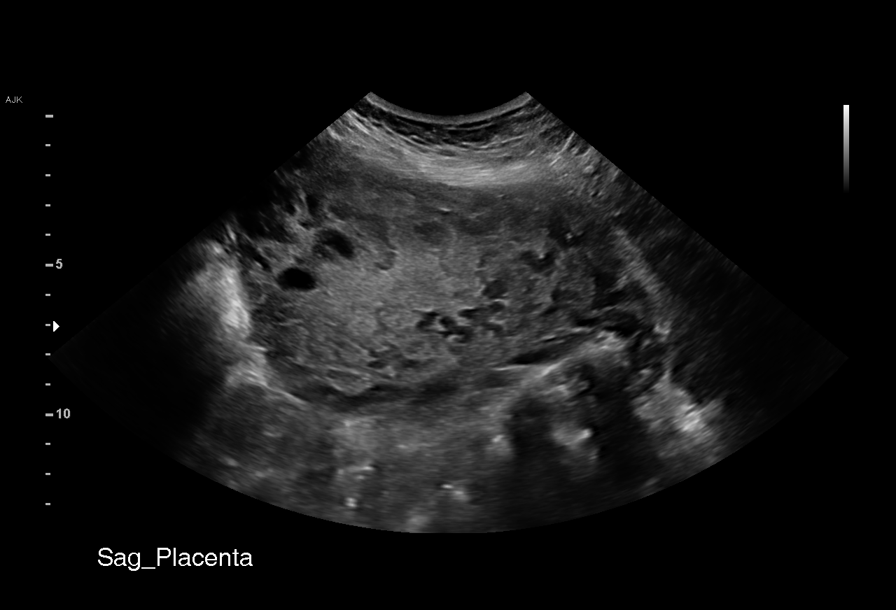
[im 4/8]
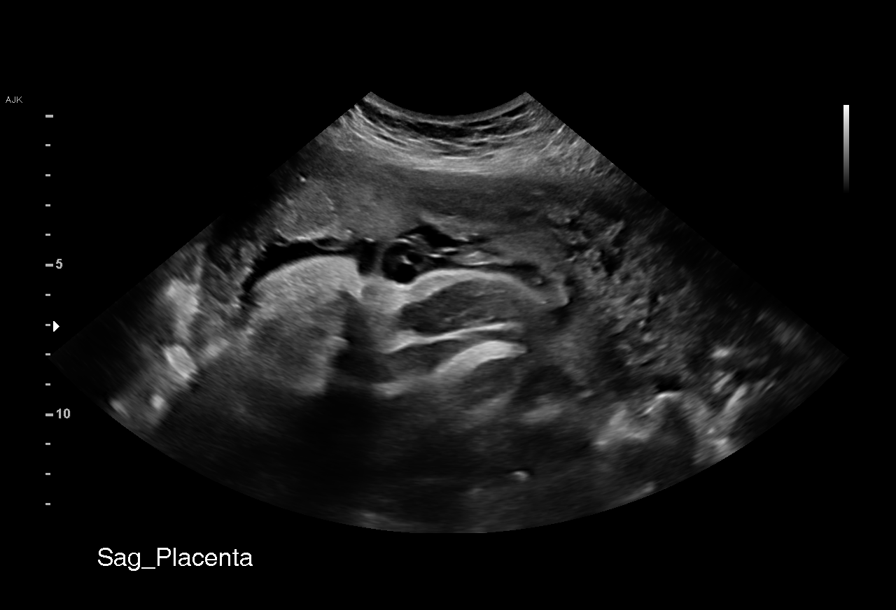
[im 5/8]
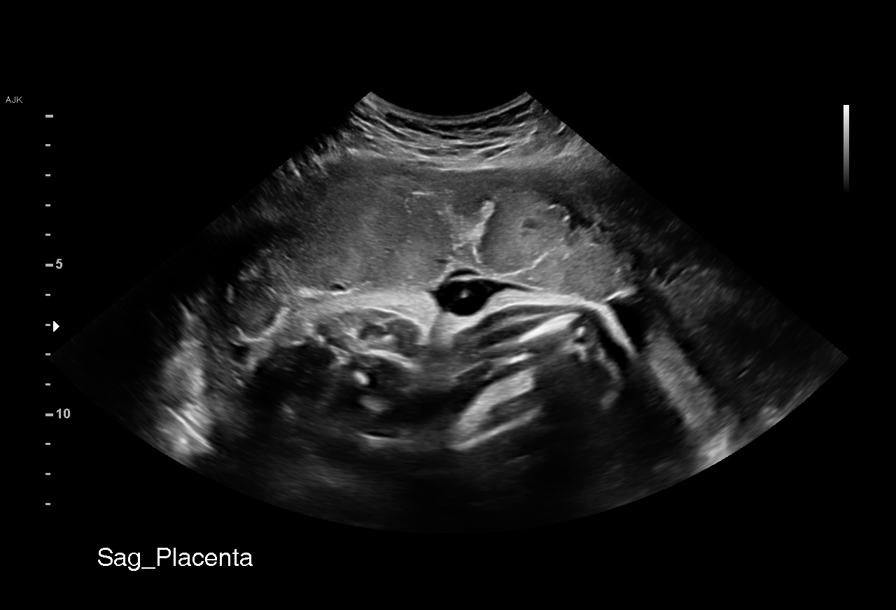
[im 6/8]
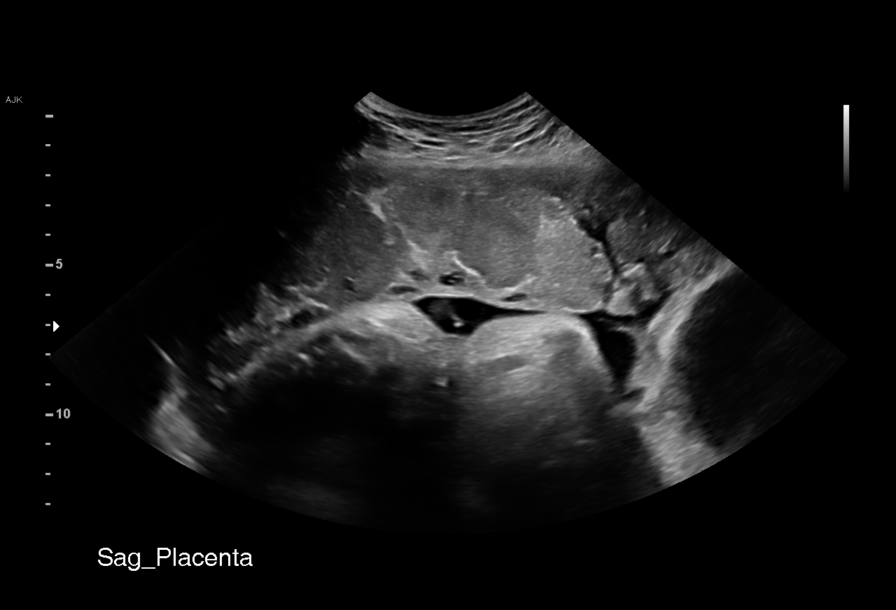
[im 7/8]
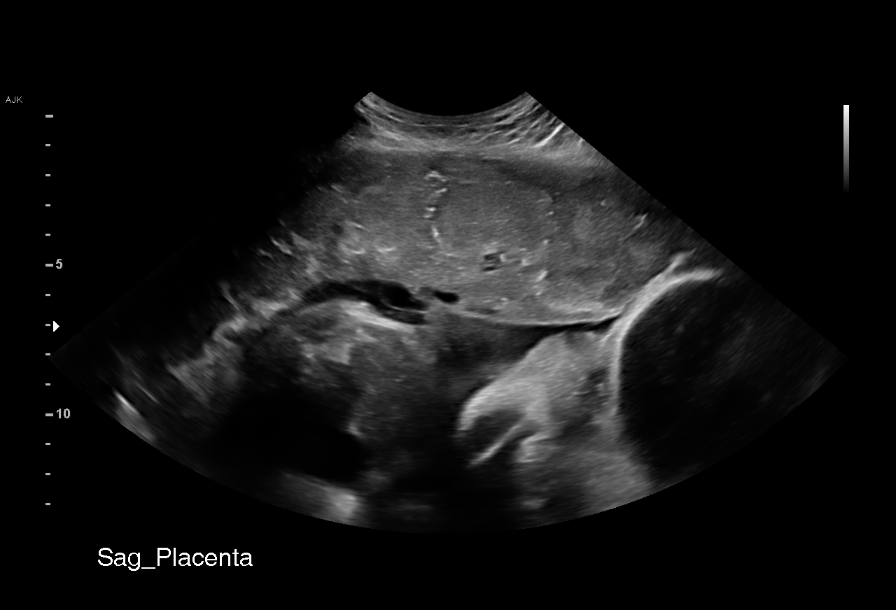
[im 8/8]
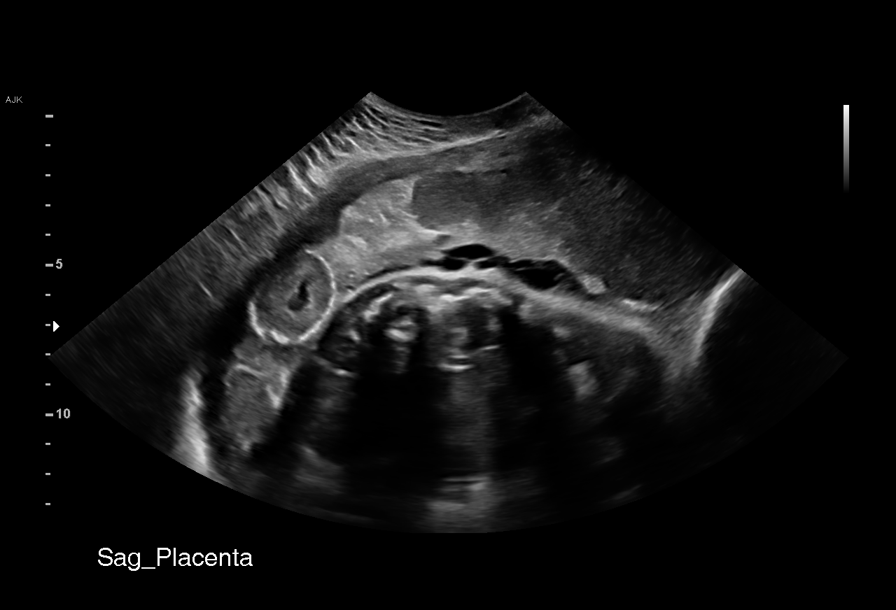

[8 of 8 positions shown; findings below may reference images not displayed]

Hospital
 Attending:        [facility]        Address:          [address]

                   [facility]                                [HOSPITAL]
 Ref. Address:     [HOSPITAL]. #B
                   [HOSPITAL],[HOSPITAL]
                   [0U]

  1  US MFM OB LIMITED                    768[address].01     [facility]
                                                       [facility]
 ----------------------------------------------------------------------

 ----------------------------------------------------------------------
Indications

  38 weeks gestation of pregnancy
  Vaginal bleeding in pregnancy, third trimester [0U]
 ----------------------------------------------------------------------
Fetal Evaluation

 Num Of Fetuses:         1
 Fetal Heart Rate(bpm):  146
 Cardiac Activity:       Observed
 Presentation:           Cephalic
 Placenta:               Anterior

 Amniotic Fluid
 AFI FV:      Within normal limits

 AFI Sum(cm)     %Tile       Largest Pocket(cm)
 6.7             4

 RUQ(cm)                     LUQ(cm)        LLQ(cm)


 Comment:    No placental abruption or previa identified.
OB History

 Gravidity:    4         Term:   1        Prem:   1        SAB:   1
 TOP:          0       Ectopic:  0        Living: 2
Gestational Age

 LMP:           38w 1d        Date:  [DATE]                 EDD:   [DATE]
 Best:          38w 1d     Det. By:  LMP  ([DATE])          EDD:   [DATE]
Impression

 Patient was evaluated for c/o vaginal bleeding.
 A limited ultrasound study was performed. Amniotic fluid is
 normal and good fetal activity is seen. Placenta is anterior
 and there is no evidence of previa or accreta.
                 [facility]

## 2019-11-07 MED ORDER — DIPHENHYDRAMINE HCL 50 MG/ML IJ SOLN: 12.5000 mg | INTRAMUSCULAR | Status: DC | PRN

## 2019-11-07 MED ORDER — SOD CITRATE-CITRIC ACID 500-334 MG/5ML PO SOLN: 30.0000 mL | ORAL | Status: DC | PRN

## 2019-11-07 MED ORDER — ONDANSETRON HCL 4 MG PO TABS: 4.0000 mg | ORAL_TABLET | ORAL | Status: DC | PRN

## 2019-11-07 MED ORDER — IBUPROFEN 600 MG PO TABS
600.0000 mg | ORAL_TABLET | Freq: Four times a day (QID) | ORAL | Status: DC
  Administered 2019-11-07 – 2019-11-08 (×6): 600 mg via ORAL
  Filled 2019-11-07 (×6): qty 1

## 2019-11-07 MED ORDER — LACTATED RINGERS IV SOLN: INTRAVENOUS | Status: DC

## 2019-11-07 MED ORDER — EPHEDRINE 5 MG/ML INJ: 10.0000 mg | INTRAVENOUS | Status: DC | PRN

## 2019-11-07 MED ORDER — OXYCODONE HCL 5 MG PO TABS: 5.0000 mg | ORAL_TABLET | ORAL | Status: DC | PRN

## 2019-11-07 MED ORDER — TERBUTALINE SULFATE 1 MG/ML IJ SOLN
INTRAMUSCULAR | Status: AC
  Administered 2019-11-07: 0.25 mg via SUBCUTANEOUS
  Filled 2019-11-07: qty 1

## 2019-11-07 MED ORDER — SIMETHICONE 80 MG PO CHEW: 80.0000 mg | CHEWABLE_TABLET | ORAL | Status: DC | PRN

## 2019-11-07 MED ORDER — PHENYLEPHRINE 40 MCG/ML (10ML) SYRINGE FOR IV PUSH (FOR BLOOD PRESSURE SUPPORT): 80.0000 ug | PREFILLED_SYRINGE | INTRAVENOUS | Status: DC | PRN

## 2019-11-07 MED ORDER — SENNOSIDES-DOCUSATE SODIUM 8.6-50 MG PO TABS
2.0000 | ORAL_TABLET | ORAL | Status: DC
  Administered 2019-11-07: 2 via ORAL
  Filled 2019-11-07: qty 2

## 2019-11-07 MED ORDER — OXYCODONE-ACETAMINOPHEN 5-325 MG PO TABS: 1.0000 | ORAL_TABLET | ORAL | Status: DC | PRN

## 2019-11-07 MED ORDER — BENZOCAINE-MENTHOL 20-0.5 % EX AERO
1.0000 "application " | INHALATION_SPRAY | CUTANEOUS | Status: DC | PRN
  Administered 2019-11-07: 1 via TOPICAL
  Filled 2019-11-07: qty 56

## 2019-11-07 MED ORDER — OXYCODONE HCL 5 MG PO TABS: 10.0000 mg | ORAL_TABLET | ORAL | Status: DC | PRN

## 2019-11-07 MED ORDER — MAGNESIUM HYDROXIDE 400 MG/5ML PO SUSP: 30.0000 mL | ORAL | Status: DC | PRN

## 2019-11-07 MED ORDER — LIDOCAINE HCL (PF) 1 % IJ SOLN
30.0000 mL | INTRAMUSCULAR | Status: AC | PRN
  Administered 2019-11-07 (×2): 6 mL via SUBCUTANEOUS

## 2019-11-07 MED ORDER — PHENYLEPHRINE 40 MCG/ML (10ML) SYRINGE FOR IV PUSH (FOR BLOOD PRESSURE SUPPORT)
PREFILLED_SYRINGE | INTRAVENOUS | Status: AC
  Filled 2019-11-07: qty 10

## 2019-11-07 MED ORDER — OXYTOCIN 40 UNITS IN NORMAL SALINE INFUSION - SIMPLE MED
2.5000 [IU]/h | INTRAVENOUS | Status: DC
  Filled 2019-11-07: qty 1000

## 2019-11-07 MED ORDER — ACETAMINOPHEN 325 MG PO TABS: 650.0000 mg | ORAL_TABLET | ORAL | Status: DC | PRN

## 2019-11-07 MED ORDER — ACETAMINOPHEN 325 MG PO TABS
650.0000 mg | ORAL_TABLET | ORAL | Status: DC | PRN
  Administered 2019-11-07: 650 mg via ORAL
  Filled 2019-11-07: qty 2

## 2019-11-07 MED ORDER — DIPHENHYDRAMINE HCL 25 MG PO CAPS: 25.0000 mg | ORAL_CAPSULE | Freq: Four times a day (QID) | ORAL | Status: DC | PRN

## 2019-11-07 MED ORDER — LACTATED RINGERS IV SOLN
500.0000 mL | INTRAVENOUS | Status: DC | PRN
  Administered 2019-11-07: 02:00:00 700 mL via INTRAVENOUS

## 2019-11-07 MED ORDER — SODIUM CHLORIDE (PF) 0.9 % IJ SOLN
INTRAMUSCULAR | Status: DC | PRN
  Administered 2019-11-07: 12 mL/h via EPIDURAL

## 2019-11-07 MED ORDER — TERBUTALINE SULFATE 1 MG/ML IJ SOLN: 0.2500 mg | Freq: Once | INTRAMUSCULAR | Status: DC

## 2019-11-07 MED ORDER — PRENATAL MULTIVITAMIN CH
1.0000 | ORAL_TABLET | Freq: Every day | ORAL | Status: DC
  Administered 2019-11-08: 1 via ORAL
  Filled 2019-11-07: qty 1

## 2019-11-07 MED ORDER — OXYTOCIN BOLUS FROM INFUSION
500.0000 mL | Freq: Once | INTRAVENOUS | Status: AC
  Administered 2019-11-07: 500 mL via INTRAVENOUS

## 2019-11-07 MED ORDER — LACTATED RINGERS IV SOLN: 500.0000 mL | Freq: Once | INTRAVENOUS | Status: DC

## 2019-11-07 MED ORDER — ONDANSETRON HCL 4 MG/2ML IJ SOLN: 4.0000 mg | INTRAMUSCULAR | Status: DC | PRN

## 2019-11-07 MED ORDER — WITCH HAZEL-GLYCERIN EX PADS: 1.0000 "application " | MEDICATED_PAD | CUTANEOUS | Status: DC | PRN

## 2019-11-07 MED ORDER — COCONUT OIL OIL: 1.0000 "application " | TOPICAL_OIL | Status: DC | PRN

## 2019-11-07 MED ORDER — FENTANYL-BUPIVACAINE-NACL 0.5-0.125-0.9 MG/250ML-% EP SOLN: 12.0000 mL/h | EPIDURAL | Status: DC | PRN

## 2019-11-07 MED ORDER — ONDANSETRON HCL 4 MG/2ML IJ SOLN: 4.0000 mg | Freq: Four times a day (QID) | INTRAMUSCULAR | Status: DC | PRN

## 2019-11-07 MED ORDER — DIBUCAINE (PERIANAL) 1 % EX OINT: 1.0000 "application " | TOPICAL_OINTMENT | CUTANEOUS | Status: DC | PRN

## 2019-11-07 MED ORDER — FENTANYL-BUPIVACAINE-NACL 0.5-0.125-0.9 MG/250ML-% EP SOLN
EPIDURAL | Status: AC
  Filled 2019-11-07: qty 250

## 2019-11-07 NOTE — H&P (Signed)
Cristina Davenport is a 33 y.o. female [phone] at [redacted]w[redacted]d presenting for PROM and signs of labor.  While in MAU, she started having vaginal bleeding, enough to soak through a pad and onto her legs.    FAMILY TREE  LAB RESULTS  Language English Pap 11/04/16 neg  Initiated care at 8wk GC/CT Initial:  -/-          36wks:  -/-  Dating by LMP c/w 8wk U/S    Support person [employer] NT/IT: neg    AFP:      MaterniT21: normal female    [place]/HgbE neg  Flu vaccine 08/28/19  CF neg  TDaP vaccine 08/28/19  SMA neg  Rhogam n/a Fragile X neg       Anatomy US Female 'Rory' Blood Type A/Positive/-- (06/26 1445)  Feeding Plan breast Antibody Negative (06/26 1445)  Contraception IUD- prefers outpt HBsAg Negative (06/26 1445)  Circumcision n/a RPR Non Reactive (06/26 1445)  Pediatrician [facility] Rubella  3.76 (06/26 1445)  Prenatal Classes declined HIV Non Reactive (06/26 1445)    GTT/A1C Early:      26-28wks: 64/136/84  BTL Consent  GBS   neg     [ ]  PCN allergy  VBAC Consent n/a    Waterbirth [ ] Class [ ] Consent [ ] CNM visit PP Needs       OB History    Gravida  4   Para  2   Term  1   Preterm  1   AB  1   Living  2     SAB  1   TAB  0   Ectopic  0   Multiple  0   Live Births  2          Past Medical History:  Diagnosis Date  . Anxiety   . Anxiety   . Bipolar 1 disorder ([place])   . GERD (gastroesophageal reflux disease)   . Hypertension    PIH  . IBS (irritable bowel syndrome)   . Migraine without aura    Past Surgical History:  Procedure Laterality Date  . ABDOMINAL SURGERY     cyst removed   . CHOLECYSTECTOMY N/A 07/10/2013   Procedure: LAPAROSCOPIC CHOLECYSTECTOMY;  Surgeon: Donato Heinz, MD;  Location: [facility];  Service: General;  Laterality: N/A;  . ESOPHAGOGASTRODUODENOSCOPY N/A 04/30/2013   [id number] distal esophagus of uncertain clinical significance-status post biopsy. Tiny hiatal hernia. No explanation  . EXAMINATION UNDER ANESTHESIA  04/21/2012    Gluteal wound repair  . WISDOM TOOTH EXTRACTION     Family History: family history includes Cancer in her father and maternal grandfather; Crohn's disease in her cousin; Hypertension in her maternal grandfather and maternal grandmother; Kidney failure in her maternal grandmother; Mental illness in her mother; Stroke in her maternal grandfather and maternal grandmother. She was adopted. Social History:  reports that she quit smoking about 5 years ago. Her smoking use included cigarettes. She smoked 0.00 packs per day for 8.00 years. She has never used smokeless tobacco. She reports that she does not drink alcohol or use drugs.     Maternal Diabetes: No Genetic Screening: Normal Maternal Ultrasounds/Referrals: Normal Fetal Ultrasounds or other Referrals:  None Maternal Substance Abuse:  No Significant Maternal Medications:  None Significant Maternal Lab Results:  Group B Strep negative Other Comments:  None  Review of Systems  Constitutional: Negative for chills, fatigue and fever.  Eyes: Negative for visual disturbance.  Respiratory: Negative for shortness of breath.  Cardiovascular: Negative for chest pain.  Gastrointestinal: Positive for abdominal pain. Negative for vomiting.  Genitourinary: Positive for vaginal bleeding and vaginal discharge. Negative for difficulty urinating, dysuria, flank pain, pelvic pain and vaginal pain.  Neurological: Negative for dizziness and headaches.  Psychiatric/Behavioral: Negative.    Maternal Medical History:  Reason for admission: Rupture of membranes and contractions.   Contractions: Onset was 3-5 hours ago.   Frequency: regular.   Perceived severity is moderate.    Fetal activity: Perceived fetal activity is normal.   Last perceived fetal movement was within the past hour.    Prenatal complications: no prenatal complications Prenatal Complications - Diabetes: none.    Dilation: 4.5 Effacement (%): 50 Station: -3 Exam by:: amber  pope, rn Blood pressure 132/80, pulse 92, temperature 97.8 F (36.6 C), temperature source Oral, resp. rate 12, height 5\' 1"  (1.549 m), weight 85.3 kg, last menstrual period 02/13/2019, SpO2 97 %, currently breastfeeding. Maternal Exam:  Uterine Assessment: Contraction strength is mild.  Contraction frequency is irregular.   Abdomen: Fetal presentation: vertex  Introitus: Amniotic fluid character: clear.     Fetal Exam Fetal Monitor Review: Mode: ultrasound.   Baseline rate: 130.  Variability: moderate (6-25 bpm).   Pattern: accelerations present, late decelerations and prolonged decelerations.    Fetal State Assessment: Category II - tracings are indeterminate.     Physical Exam  Nursing note and vitals reviewed. Constitutional: She is oriented to person, place, and time. She appears well-developed and well-nourished.  Cardiovascular: Normal rate, regular rhythm and normal heart sounds.  Respiratory: Effort normal and breath sounds normal.  GI: Soft.  Musculoskeletal:        General: Normal range of motion.     Cervical back: Normal range of motion.  Neurological: She is alert and oriented to person, place, and time.  Skin: Skin is warm and dry.  Psychiatric: She has a normal mood and affect. Her behavior is normal. Judgment and thought content normal.    Prenatal labs: ABO, Rh: --/--/A POS, A POS Performed at [facility], [address], [place], [place] [address]  [phone]/24 0138) Antibody: NEG (12/24 0138) Rubella: 3.76 (06/26 1445) RPR: Non Reactive (10/14 0919)  HBsAg: Negative (06/26 1445)  HIV: Non Reactive (10/14 0919)  GBS: --[place] Cloud (12/07 0000)   Assessment/Plan: [id number] at [redacted]w[redacted]d presents for PROM and onset of active labor Vaginal bleeding at term GBS negative  Admit to L&D Pad counts for bleeding Continuous EFM CBC on admission Expectant management on admission   Fatima Blank 11/07/2019, 3:32 AM

## 2019-11-07 NOTE — Lactation Note (Signed)
This note was copied from a baby's chart. Lactation Consultation Note  Patient Name: Girl Alyaa Rigdon [id number] Date: 11/07/2019  Spoke with RN regarding second blood sugar low.  Rn reports she gave infant gel  and started mom pumping with DEBP. Went back to room.  Spoke with mom. MD examining baby.  Quickly reviewed Green sheet with mom and urged her to use those guidelines with infant right now.  Mom in agreement and reports she will pump with DEBP past each breastfeeding and feed infant back whatever breastmilk she gets. Urged her to call lactation as needed.   Maternal Data    Feeding Feeding Type: Breast Fed  LATCH Score Latch: Grasps breast easily, tongue down, lips flanged, rhythmical sucking.  Audible Swallowing: A few with stimulation  Type of Nipple: Everted at rest and after stimulation  Comfort (Breast/Nipple): Soft / non-tender  Hold (Positioning): Assistance needed to correctly position infant at breast and maintain latch.  LATCH Score: 8  Interventions    Lactation Tools Discussed/Used Pump Review: Setup, frequency, and cleaning Initiated by:: ER Date initiated:: 11/07/19   Consult Status      [place] 11/07/2019, 5:09 PM

## 2019-11-07 NOTE — MAU Provider Note (Signed)
  S: Ms. Cristina Davenport is a 33 y.o. [id number] at [redacted]w[redacted]d  who presents to MAU today complaining of leaking of fluid since 2330 this evening. She denies vaginal bleeding. She endorses contractions and states she felt her first contraction of the evening while in MAU. She reports normal fetal movement.    She most recently ate at 9pm 11/06/2019.  O: BP 132/85 (BP Location: Right Arm)   Pulse 92   Temp 98.7 F (37.1 C) (Oral)   Resp 12   Wt 85 kg   LMP 02/13/2019 (Exact Date)   SpO2 97%   BMI 35.43 kg/m  GENERAL: Well-developed, well-nourished female in no acute distress.  HEAD: Normocephalic, atraumatic.  CHEST: Normal effort of breathing, regular heart rate ABDOMEN: Soft, nontender, gravid PELVIC: Normal external female genitalia. Vagina is pink and rugated. .   Cervical exam:  Dilation: 3 Effacement (%): 50 Station: Ballotable Exam by:: Maryelizabeth Kaufmann, CNM   Fetal Monitoring: Baseline: 135 Variability: Moderate Accelerations: POS 15 x 15 Decelerations: prolonged at 0142 Contractions: Irregular  Results for orders placed or performed during the hospital encounter of 11/07/19 (from the past 24 hour(s))  Fern Test     Status: None   Collection Time: 11/07/19  1:23 AM  Result Value Ref Range   POCT Fern Test Positive = ruptured amniotic membanes     A: SIUP at [redacted]w[redacted]d  GBS neg Grossly ruptured per RN exam Called to room by patient's husband for new onset vaginal bleeding. Large clot noted, approximately 62mL Bedside ultrasound requested, no concerning findings on formal bedside ultrasound Returned to bedside per RN request for prolonged deceleration. Resolved with repositioning and fluid bolus  P: Grossly ruptured at 2330 Continue to monitor bleeding Admit to labor and delivery  Mallie Snooks, CNM 11/07/2019 2:22 AM

## 2019-11-07 NOTE — Anesthesia Procedure Notes (Signed)
Epidural Patient location during procedure: OB Start time: 11/07/2019 5:33 AM End time: 11/07/2019 5:36 AM  Staffing Anesthesiologist: Lyn Hollingshead, MD Performed: anesthesiologist   Preanesthetic Checklist Completed: patient identified, IV checked, site marked, risks and benefits discussed, surgical consent, monitors and equipment checked, pre-op evaluation and timeout performed  Epidural Patient position: sitting Prep: DuraPrep and site prepped and draped Patient monitoring: continuous pulse ox and blood pressure Approach: midline Location: L3-L4 Injection technique: LOR air  Needle:  Needle type: Tuohy  Needle gauge: 17 G Needle length: 9 cm and 9 Needle insertion depth: 6 cm Catheter type: closed end flexible Catheter size: 19 Gauge Catheter at skin depth: 11 cm Test dose: negative and Other  Assessment Events: blood not aspirated, injection not painful, no injection resistance, no paresthesia and negative IV test  Additional Notes Reason for block:procedure for pain

## 2019-11-07 NOTE — Progress Notes (Signed)
Called to room by RN with FHR bradycardia. Pt is 10/100/+1 station and feeling urge to push.  Pt in stirrups and pushing with RN when I arrived. Pt pushed well x 2 contractions and moved baby to +2 station but FHR not recovering between contractions.  Terbutaline 0.25 mg given SQ x 1.  Dr Rip Harbour called and arrived at bedside.  Difficult to determine FHR vs MHR so fetal scalp electrode applied.  Pt contractions reduced with terbutaline and FHR returned to baseline of 140s with moderate variability.  Pt to rest until contractions/urge to push resume. RN to notify provider of FHR decelerations or urge to push.

## 2019-11-07 NOTE — Anesthesia Preprocedure Evaluation (Signed)
Anesthesia Evaluation  Patient identified by MRN, date of birth, ID band Patient awake    Reviewed: Allergy & Precautions, H&P , NPO status , Patient's Chart, lab work & pertinent test results  Airway Mallampati: II  TM Distance: >3 FB Neck ROM: full    Dental no notable dental hx.    Pulmonary former smoker,    Pulmonary exam normal breath sounds clear to auscultation       Cardiovascular hypertension, negative cardio ROS Normal cardiovascular exam Rhythm:regular Rate:Normal     Neuro/Psych PSYCHIATRIC DISORDERS Anxiety Depression Bipolar Disorder    GI/Hepatic Neg liver ROS, GERD  ,  Endo/Other  negative endocrine ROS  Renal/GU negative Renal ROS  negative genitourinary   Musculoskeletal negative musculoskeletal ROS (+)   Abdominal (+) + obese,   Peds  Hematology negative hematology ROS (+)   Anesthesia Other Findings   Reproductive/Obstetrics (+) Pregnancy                             Anesthesia Physical Anesthesia Plan  ASA: II  Anesthesia Plan: Epidural   Post-op Pain Management:    Induction:   PONV Risk Score and Plan:   Airway Management Planned:   Additional Equipment:   Intra-op Plan:   Post-operative Plan:   Informed Consent: I have reviewed the patients History and Physical, chart, labs and discussed the procedure including the risks, benefits and alternatives for the proposed anesthesia with the patient or authorized representative who has indicated his/her understanding and acceptance.       Plan Discussed with:   Anesthesia Plan Comments:         Anesthesia Quick Evaluation

## 2019-11-07 NOTE — Discharge Summary (Signed)
Postpartum Discharge Summary     Patient Name: Cristina Davenport DOB: [birth year] MRN: [id number]  Date of admission: 11/07/2019 Delivering Provider: Clarnce Flock   Date of discharge: 11/08/2019  Admitting diagnosis: Labor and delivery, indication for care [O75.9] Intrauterine pregnancy: [redacted]w[redacted]d    Secondary diagnosis:  Active Problems:   Anxiety   Hx of preeclampsia, prior pregnancy, currently pregnant   Supervision of normal pregnancy   HSV-1 infection   Short femur of fetus on prenatal ultrasound   Labor and delivery, indication for care  Additional problems: None     Discharge diagnosis: Term Pregnancy Delivered                                                                                                Post partum procedures: None  Augmentation: None  Complications: None  Hospital course:  Onset of Labor With Vaginal Delivery     33y.o. yo [id number] 31w1das admitted in Latent Labor on 11/07/2019. Patient had an uncomplicated labor course as follows: Patient arrived at 3 cm dilation and labored spontaneously. She reached full dilation but was at high station so was given terbutaline, and then after a period of recovery resumed pushing with NSVD.  Membrane Rupture Time/Date: 11:30 PM ,11/06/2019   Intrapartum Procedures: Episiotomy: None [1]                                         Lacerations:  Labial [10]  Patient had a delivery of a Viable infant. 11/07/2019  Information for the patient's newborn:  VaDanyia, Borunda0[[id number]]    Delivery notable for large clot seen at time of placental delivery and hx on admission of some vaginal bleeding suspicious for small abruption, placenta sent to pathology.   Pateint had an uncomplicated postpartum course.  She is ambulating, tolerating a regular diet, passing flatus, and urinating well. Patient is discharged home in stable condition on 11/08/19.  Delivery time: 10:09 AM    Magnesium Sulfate received: No BMZ  received: No Rhophylac:N/A MMR:N/A Transfusion:No  Physical exam  Vitals:   11/07/19 1701 11/07/19 2110 11/08/19 0047 11/08/19 0602  BP: 118/68 130/77 117/71 123/78  Pulse: 82 81 80 79  Resp: _0 Temp: 97.9 F (36.6 C) 98.6 F (37 C) 98.2 F (36.8 C) 98.2 F (36.8 C)  TempSrc: Oral Oral Oral Oral  SpO2:  97% 98% 100%  Weight:      Height:       General: alert, cooperative and no distress Lochia: appropriate Uterine Fundus: firm Incision: N/A DVT Evaluation: No evidence of DVT seen on physical exam. Labs: Lab Results  Component Value Date   WBC 10.6 (H) 11/07/2019   HGB 11.9 (L) 11/07/2019   HCT 36.1 11/07/2019   MCV 90.0 11/07/2019   PLT 184 11/07/2019   CMP Latest Ref Rng & Units 10/15/2019  Glucose 70 - 99 mg/dL 74  BUN 6 - 20 mg/dL <5(L)  Creatinine 0.44 -  1.00 mg/dL 0.53  Sodium 135 - 145 mmol/L 136  Potassium 3.5 - 5.1 mmol/L 3.8  Chloride 98 - 111 mmol/L 105  CO2 22 - 32 mmol/L 15(L)  Calcium 8.9 - 10.3 mg/dL 9.0  Total Protein 6.5 - 8.1 g/dL 6.0(L)  Total Bilirubin 0.3 - 1.2 mg/dL 0.6  Alkaline Phos 38 - 126 U/L 97  AST 15 - 41 U/L 19  ALT 0 - 44 U/L 19    Discharge instruction: per After Visit Summary and "Baby and Me Booklet".  After visit meds:  Allergies as of 11/08/2019      Reactions   Latex Other (See Comments)   General irritation   Doxycycline Rash   Solar rash      Medication List    TAKE these medications   acetaminophen 325 MG tablet Commonly known as: Tylenol Take 2 tablets (650 mg total) by mouth every 4 (four) hours as needed (for pain scale < 4).   Blood Pressure Monitor Misc For regular home bp monitoring during pregnancy   ibuprofen 600 MG tablet Commonly known as: ADVIL Take 1 tablet (600 mg total) by mouth every 6 (six) hours.   ondansetron 4 MG tablet Commonly known as: ZOFRAN Take 1 tablet (4 mg total) by mouth every 8 (eight) hours as needed for nausea or vomiting.   prenatal multivitamin Tabs  tablet Take 1 tablet by mouth daily at 12 noon.   promethazine 25 MG tablet Commonly known as: PHENERGAN Take 1 tablet (25 mg total) by mouth at bedtime.   sertraline 100 MG tablet Commonly known as: ZOLOFT TAKE 1 & 1/2 (ONE & ONE-HALF) TABLETS BY MOUTH ONCE DAILY   valACYclovir 1000 MG tablet Commonly known as: VALTREX Take 2 tablets now,then two tablets 12 hours later       Diet: routine diet  Activity: Advance as tolerated. Pelvic rest for 6 weeks.   Outpatient follow up:6 weeks Follow up Appt: Future Appointments  Date Time Provider [place]  11/11/2019 10:10 AM Roma Schanz, CNM [facility]   Follow up Visit:   Please schedule this patient for Postpartum visit in: 6 weeks with the following provider: Any provider For C/S patients schedule nurse incision check in weeks 2 weeks: no Low risk pregnancy complicated by: n/a Delivery mode:  SVD Anticipated Birth Control:  outpatient IUD PP Procedures needed: IUD insertion  Schedule Integrated [place] visit: no   Newborn Data: Live born female  Birth Weight: 5 lb 4.7 oz (2400 g) APGAR: 6, 8  Newborn Delivery   Birth date/time: 11/07/2019 10:09:00 Delivery type: Vaginal, Spontaneous      Baby Feeding: Breast Disposition:home with mother   11/08/2019 Merilyn Baba, DO

## 2019-11-07 NOTE — MAU Note (Signed)
Pt states that at 2330 tonight she was sleeping and felt like she had wet herself.   Pt states that she got up and went to bathroom and felt like the fluid was still coming out.   Pt leaking now. Clear fluid   Reports +FM   Denies vaginal bleeding.

## 2019-11-07 NOTE — Anesthesia Postprocedure Evaluation (Signed)
Anesthesia Post Note  Patient: Cristina Davenport  Procedure(s) Performed: AN AD HOC LABOR EPIDURAL     Patient location during evaluation: Mother Baby Anesthesia Type: Epidural Level of consciousness: awake and alert Pain management: pain level controlled Vital Signs Assessment: post-procedure vital signs reviewed and stable Respiratory status: spontaneous breathing, nonlabored ventilation and respiratory function stable Cardiovascular status: stable Postop Assessment: no headache, no backache and epidural receding Anesthetic complications: no    Last Vitals:  Vitals:   11/07/19 1233 11/07/19 1327  BP: 122/80 128/81  Pulse: 82 85  Resp: 18 18  Temp: 37.3 C 37.3 C  SpO2: 99% 98%    Last Pain:  Vitals:   11/07/19 1327  TempSrc: Oral  PainSc: 3    Pain Goal:                   Drucie Opitz

## 2019-11-07 NOTE — Lactation Note (Addendum)
This note was copied from a baby's chart. Lactation Consultation Note  Patient Name: Girl Chancey Cullinane UHKIS'N Date: 11/07/2019  P3 mom who breastfed her second baby for 1 year. Infant 5 hours old and initial blood sugar 34. Infant is also less than 6 pounds and potentially SGA, and early term. Mom reports first baby was born at 30 weeks and jaundiced so she only breastfed her for 1 week.  Discussed with mom how small early term babies may need a little extra so it was important to pump and hand express.  Mom reports she did not feel it was necessary since she just breastfed well and took both breasts. Inquired which she felt was easiest for her that I felt she needed to pump or hand express past breastfeeding.  Mom reports she will pump.  Took pump and kit and all accessories needed to pump.  Mom reports she will start later.  Attempted to call RN to let her know.  RN in a room at this time.   Maternal Data    Feeding Feeding Type: Breast Fed  [facility] Score                   Interventions    Lactation Tools Discussed/Used     Consult Status      Trino Higinbotham Thompson Caul 11/07/2019, 3:31 PM

## 2019-11-07 NOTE — Progress Notes (Signed)
Pt to room 407 via wheel chair with infant in arms

## 2019-11-08 MED ORDER — ACETAMINOPHEN 325 MG PO TABS: 650.0000 mg | ORAL_TABLET | ORAL | 0 refills | Status: DC | PRN

## 2019-11-08 MED ORDER — IBUPROFEN 600 MG PO TABS: 600.0000 mg | ORAL_TABLET | Freq: Four times a day (QID) | ORAL | 0 refills | Status: DC

## 2019-11-08 NOTE — Clinical Social Work Maternal (Signed)
CLINICAL SOCIAL WORK MATERNAL/CHILD NOTE  Patient Details  Name: Cristina Davenport MRN: [id number] Date of Birth: [birth year]  Date:  11/08/2019  Clinical Social Worker Initiating Note:  Laurey Arrow Date/Time: Initiated:  11/08/19/1210     Child's Name:  Cristina Davenport   Biological Parents:  Mother, Father   Need for Interpreter:  None   Reason for Referral:  Behavioral Health Concerns   Address:  [place] [address]    Phone number:  [phone] (home)     Additional phone number:   Household Members/Support Persons (HM/SP):   Household Member/Support Person 1, Household Member/Support Person 2, Household Member/Support Person 3   HM/SP Name Relationship DOB or Age  HM/SP -[place] Husband/FOB 11/14/1989  HM/SP -2 Girl daughter 09/28/2008  HM/SP -3 boy son 05/17/2017  HM/SP -4        HM/SP -5        HM/SP -6        HM/SP -7        HM/SP -8          Natural Supports (not living in the home):  Immediate Family, Extended Family, Parent   Professional Supports: None   Employment: Animator   Type of Work: Ship broker:  [employer] school graduate   Homebound arranged:    Museum/gallery curator Resources:  Multimedia programmer   Other Resources:      Cultural/Religious Considerations Which May Impact Care:  None Reported  Strengths:  Ability to meet basic needs , Pediatrician chosen, Compliance with medical plan , Home prepared for child , Understanding of illness, Psychotropic Medications   Psychotropic Medications:  Zoloft      Pediatrician:    [place]  Pediatrician List:   [facility]  [place]      Pediatrician Fax Number:    Risk Factors/Current Problems:  Mental Health Concerns    Cognitive State:  Linear Thinking , Insightful , Goal Oriented , Alert , Able to Concentrate    Mood/Affect:  Interested ,  Comfortable , Happy , Bright , Relaxed    CSW Assessment: CSW meet with MOB in room 407 to complete an assessment for mental health history.  When CSW arrived, MOB was in the bed attaching and bonding with infant as evidence by MOB engaging in skin to skin. MOB was inviting and polite.   CSW inquired about MOB's MH hx and MOB acknowledged a dx of bipolar disorder and depression.  Per MOB, MOB was dx in 2010 and since has tried various medication regiments.  MOB communicated that she has been stable on Zoloft for the past 2 years and symptoms have been managed well. MOB also acknowledged PPD symptoms with MOB's older 2 children and reported feeling comfortable seeking help if needed. CSW educated MOB about PPD. CSW informed MOB of possible supports and interventions to decrease PPD.  CSW also encouraged MOB to seek medical attention if needed for increased signs and symptoms of PPD.   CSW recommends self-evaluation during the postpartum time period using the New Mom Checklist from Postpartum Progress and encouraged MOB to contact a medical professional if symptoms are noted at any time.   CSW also offered MOB resources for outpatient behavioral health services and  MOB declined. MOB presented with insight and awareness and reported having a great support team. CSW assessed for safety  and MOB denied SI, HI, and DV.  CSW reviewed safe sleep and SIDS. MOB was knowledgeable and asked appropriate questions.   CSW thanked MOB for meeting with CSW.  CSW Plan/Description:  No Further Intervention Required/No Barriers to Discharge, Sudden Infant Death Syndrome (SIDS) Education, Perinatal Mood and Anxiety Disorder (PMADs) Education, Other Information/Referral to [employer], MSW, [employer] Clinical Social Work [phone]   Dimple Nanas, LCSW 11/08/2019, 12:13 PM

## 2019-11-08 NOTE — Progress Notes (Signed)
AVS printed and discharge instructions given to patient. Patient instructed to call for follow up appointment and to pick up prescriptions. Pt verbalized understanding and has no further questions.

## 2019-11-08 NOTE — Lactation Note (Signed)
This note was copied from a baby's chart. Lactation Consultation Note Baby 15 hrs old. Baby having low glucose. Baby is BF well per mom but doesn't like the bottle. LC attempting to give formula in bottle. Baby wouldn't suckle not once. Baby having emesis. Noted formula. Dr. Rosanna Randy baby to have 24 cal. Formula. Attempted to get baby to suckle on gloved finger but she wouldn't. Baby gagging and heaving. At intervals. Gave baby to mom to hold upright since having emesis. Before LC left, baby trying to have stool. Noted bruising to Lt. Side of forehead.  Discussed w/mom how to conserve energy, not feeding BF and supplement longer than 30 min. Strict I&O. Call for assistance if needed. Reported to RN.  Patient Name: Cristina Davenport [id number] Date: 11/08/2019 Reason for consult: Follow-up assessment;Infant < 6lbs;Early term 37-38.6wks   Maternal Data    Feeding Feeding Type: Formula Nipple Type: Slow - flow  LATCH Score Latch: Grasps breast easily, tongue down, lips flanged, rhythmical sucking.  Audible Swallowing: A few with stimulation  Type of Nipple: Everted at rest and after stimulation  Comfort (Breast/Nipple): Filling, red/small blisters or bruises, mild/mod discomfort  Hold (Positioning): No assistance needed to correctly position infant at breast.  LATCH Score: 8  Interventions Interventions: DEBP  Lactation Tools Discussed/Used     Consult Status Consult Status: Follow-up Date: 11/08/19 Follow-up type: In-patient    Cristina Davenport, Elta Guadeloupe 11/08/2019, 12:20 AM

## 2019-11-08 NOTE — Discharge Instructions (Signed)
Postpartum Care After Vaginal Delivery This sheet gives you information about how to care for yourself from the time you deliver your baby to up to 6-12 weeks after delivery (postpartum period). Your health care provider may also give you more specific instructions. If you have problems or questions, contact your health care provider. Follow these instructions at home: Vaginal bleeding  It is normal to have vaginal bleeding (lochia) after delivery. Wear a sanitary pad for vaginal bleeding and discharge. ? During the first week after delivery, the amount and appearance of lochia is often similar to a menstrual period. ? Over the next few weeks, it will gradually decrease to a dry, yellow-brown discharge. ? For most women, lochia stops completely by 4-6 weeks after delivery. Vaginal bleeding can vary from woman to woman.  Change your sanitary pads frequently. Watch for any changes in your flow, such as: ? A sudden increase in volume. ? A change in color. ? Large blood clots.  If you pass a blood clot from your vagina, save it and call your health care provider to discuss. Do not flush blood clots down the toilet before talking with your health care provider.  Do not use tampons or douches until your health care provider says this is safe.  If you are not breastfeeding, your period should return 6-8 weeks after delivery. If you are feeding your child breast milk only (exclusive breastfeeding), your period may not return until you stop breastfeeding. Perineal care  Keep the area between the vagina and the anus (perineum) clean and dry as told by your health care provider. Use medicated pads and pain-relieving sprays and creams as directed.  If you had a cut in the perineum (episiotomy) or a tear in the vagina, check the area for signs of infection until you are healed. Check for: ? More redness, swelling, or pain. ? Fluid or blood coming from the cut or tear. ? Warmth. ? Pus or a bad  smell.  You may be given a squirt bottle to use instead of wiping to clean the perineum area after you go to the bathroom. As you start healing, you may use the squirt bottle before wiping yourself. Make sure to wipe gently.  To relieve pain caused by an episiotomy, a tear in the vagina, or swollen veins in the anus (hemorrhoids), try taking a warm sitz bath 2-3 times a day. A sitz bath is a warm water bath that is taken while you are sitting down. The water should only come up to your hips and should cover your buttocks. Breast care  Within the first few days after delivery, your breasts may feel heavy, full, and uncomfortable (breast engorgement). Milk may also leak from your breasts. Your health care provider can suggest ways to help relieve the discomfort. Breast engorgement should go away within a few days.  If you are breastfeeding: ? Wear a bra that supports your breasts and fits you well. ? Keep your nipples clean and dry. Apply creams and ointments as told by your health care provider. ? You may need to use breast pads to absorb milk that leaks from your breasts. ? You may have uterine contractions every time you breastfeed for up to several weeks after delivery. Uterine contractions help your uterus return to its normal size. ? If you have any problems with breastfeeding, work with your health care provider or Science writer.  If you are not breastfeeding: ? Avoid touching your breasts a lot. Doing this can make  your breasts produce more milk. ? Wear a good-fitting bra and use cold packs to help with swelling. ? Do not squeeze out (express) milk. This causes you to make more milk. Intimacy and sexuality  Ask your health care provider when you can engage in sexual activity. This may depend on: ? Your risk of infection. ? How fast you are healing. ? Your comfort and desire to engage in sexual activity.  You are able to get pregnant after delivery, even if you have not had  your period. If desired, talk with your health care provider about methods of birth control (contraception). Medicines  Take over-the-counter and prescription medicines only as told by your health care provider.  If you were prescribed an antibiotic medicine, take it as told by your health care provider. Do not stop taking the antibiotic even if you start to feel better. Activity  Gradually return to your normal activities as told by your health care provider. Ask your health care provider what activities are safe for you.  Rest as much as possible. Try to rest or take a nap while your baby is sleeping. Eating and drinking   Drink enough fluid to keep your urine pale yellow.  Eat high-fiber foods every day. These may help prevent or relieve constipation. High-fiber foods include: ? Whole grain cereals and breads. ? Brown rice. ? Beans. ? Fresh fruits and vegetables.  Do not try to lose weight quickly by cutting back on calories.  Take your prenatal vitamins until your postpartum checkup or until your health care provider tells you it is okay to stop. Lifestyle  Do not use any products that contain nicotine or tobacco, such as cigarettes and e-cigarettes. If you need help quitting, ask your health care provider.  Do not drink alcohol, especially if you are breastfeeding. General instructions  Keep all follow-up visits for you and your baby as told by your health care provider. Most women visit their health care provider for a postpartum checkup within the first 3-6 weeks after delivery. Contact a health care provider if:  You feel unable to cope with the changes that your child brings to your life, and these feelings do not go away.  You feel unusually sad or worried.  Your breasts become red, painful, or hard.  You have a fever.  You have trouble holding urine or keeping urine from leaking.  You have little or no interest in activities you used to enjoy.  You have not  breastfed at all and you have not had a menstrual period for 12 weeks after delivery.  You have stopped breastfeeding and you have not had a menstrual period for 12 weeks after you stopped breastfeeding.  You have questions about caring for yourself or your baby.  You pass a blood clot from your vagina. Get help right away if:  You have chest pain.  You have difficulty breathing.  You have sudden, severe leg pain.  You have severe pain or cramping in your lower abdomen.  You bleed from your vagina so much that you fill more than one sanitary pad in one hour. Bleeding should not be heavier than your heaviest period.  You develop a severe headache.  You faint.  You have blurred vision or spots in your vision.  You have bad-smelling vaginal discharge.  You have thoughts about hurting yourself or your baby. If you ever feel like you may hurt yourself or others, or have thoughts about taking your own life, get help  right away. You can go to the nearest emergency department or call:  Your local emergency services (911 in the [place]  A suicide crisis helpline, such as the [place] at 530-733-3047. This is open 24 hours a day. Summary  The period of time right after you deliver your newborn up to 6-12 weeks after delivery is called the postpartum period.  Gradually return to your normal activities as told by your health care provider.  Keep all follow-up visits for you and your baby as told by your health care provider. This information is not intended to replace advice given to you by your health care provider. Make sure you discuss any questions you have with your health care provider. Document Released: 08/28/2007 Document Revised: 11/03/2017 Document Reviewed: 08/14/2017 Elsevier Patient Education  2020 [employer].

## 2019-11-08 NOTE — Lactation Note (Addendum)
This note was copied from a baby's chart. Lactation Consultation Note  Patient Name: Cristina Davenport [id number] Date: 11/08/2019  Previous LC had written about this infant's difficulty with bottle feeding. Mom is now using a Nuk bottle from home. Mom says that the infant is spitting up after feedings & had one instance of milk coming out of her nose with the last feeding. Previous to using the Nuk nipple, the parents had used the yellow Similac slow flow nipple. I offered the parents the Enfamil Extra-Slow Flow nipple & suggested that Mom try that. Mom will let the nurse know if the Extra Slow-Flow nipple works better.  I mentioned to Mom that there is the possibility of a feeding evaluation from our NICU feeding team (SLP or PT) to determine the best bottle for an infant when there are concerns. I did mention, though, that since tomorrow is a Saturday and the day after Christmas, there is a chance they won't be available. Mom expressed interest if the Extra Slow-Flow nipple does not seem to help infant with her feeding.   Mom feels like infant does better with feeding at the breast & she has been pumping. She most recently pumped about 10 mL.   Matthias Hughs [facility] 11/08/2019, 2:01 PM

## 2019-11-09 ENCOUNTER — Ambulatory Visit: Payer: Self-pay

## 2019-11-09 NOTE — Lactation Note (Signed)
This note was copied from a baby's chart. Lactation Consultation Note  Patient Name: Cristina Davenport [id number] Date: 11/09/2019 Reason for consult: Follow-up assessment;Infant weight loss  Baby is 67 hours old / for D/C today.  LC reviewed the doc flow sheets with mom.  Per mom milk is coming in and pumped off 30 ml this am.  LC encouraged mom to keep her pumping up.  Mom mentioned the frequent burping is helping.  LC reviewed the recommended LC plan for a Less than 6 pound  Infant / ET.  Feeding with feeding cues / 8-12 times a day attempt at the breast 1st  If she latches feed for 15 -20 mins / 30 mins max and supplement PACE feeding at least 30 ml EBM or formula 22 cal Neosure.  Post pump both breast for 15 -20 mins / save milk for the next feeding.  If there is a feeding baby is wide awake , may offer both breast.  Once the baby is back to birth weight, gaining steadily, and over 6 pounds can  Feed more both breast and then the supplementing can be decreased.  LC recommended giving the baby 1 week to gain weight and then call for [facility] O/P appt. . Mom requested to call [facility] and see if their [facility] department does [place] O/P. If not will call back for [facility] O/P appt. Across from [place] long and mom has the phone number.  LC reviewed sore nipple and engorgement prevention and tx.  Mom denies soreness and has a HAKKA ( hand pump ) DEBP Medela and  A Spectra DEBP.  Discussed nutritive vs non- nutritive feeding patterns and the importance of  Watching the baby for hanging out latched.  LC mentioned to mom is she has a feeding where it is time the baby to feed and  Baby is sluggish, may have to give and appetizer 1st and then offer the breast.  May also try to burp the baby prior to latch to move the gas down.  Mom has the [facility] pamphlet with phone numbers.     Maternal Data Has patient been taught Hand Expression?: Yes  Feeding Feeding Type: Formula Nipple Type: Slow - flow  LATCH Score                    Interventions Interventions: Breast feeding basics reviewed  Lactation Tools Discussed/Used Tools: Pump Breast pump type: Double-Electric Breast Pump Pump Review: Milk Storage   Consult Status Consult Status: Follow-up Date: 11/09/19 Follow-up type: [place] 11/09/2019, 9:56 AM

## 2019-11-11 ENCOUNTER — Encounter: Payer: 59 | Admitting: Women's Health

## 2019-11-11 LAB — SURGICAL PATHOLOGY

## 2019-12-12 ENCOUNTER — Other Ambulatory Visit: Payer: Self-pay

## 2019-12-12 ENCOUNTER — Ambulatory Visit: Payer: 59 | Admitting: Advanced Practice Midwife

## 2019-12-12 ENCOUNTER — Encounter: Payer: Self-pay | Admitting: Advanced Practice Midwife

## 2019-12-12 ENCOUNTER — Ambulatory Visit (INDEPENDENT_AMBULATORY_CARE_PROVIDER_SITE_OTHER): Payer: 59 | Admitting: Advanced Practice Midwife

## 2019-12-12 MED ORDER — NORETHINDRONE 0.35 MG PO TABS: ORAL_TABLET | ORAL | 11 refills | Status: DC

## 2019-12-12 NOTE — Progress Notes (Signed)
Cristina Davenport is a 34 y.o. who presents for a postpartum visit. She is 4 weeks postpartum following a spontaneous vaginal delivery. I have fully reviewed the prenatal and intrapartum course. The delivery was at 38.1 gestational weeks.  Anesthesia: epidural. Postpartum course has been uneventful. Baby's course has been uneventful. Baby is feeding by breast. Bleeding: no bleeding. Bowel function is normal. Bladder function is normal. Patient is not sexually active. Contraception method is abstinence. Postpartum depression screening: negative. On zoloft 150mg .  Doing well.    Current Outpatient Medications:  .  Prenatal Vit-Fe Fumarate-FA (PRENATAL MULTIVITAMIN) TABS tablet, Take 1 tablet by mouth daily at 12 noon., Disp: , Rfl:  .  sertraline (ZOLOFT) 100 MG tablet, TAKE 1 & 1/2 (ONE & ONE-HALF) TABLETS BY MOUTH ONCE DAILY, Disp: 135 tablet, Rfl: 1 .  ibuprofen (ADVIL) 600 MG tablet, Take 1 tablet (600 mg total) by mouth every 6 (six) hours., Disp: 30 tablet, Rfl: 0 .  norethindrone (MICRONOR) 0.35 MG tablet, Take at the same time daily, Disp: 1 Package, Rfl: 11 .  valACYclovir (VALTREX) 1000 MG tablet, Take 2 tablets now,then two tablets 12 hours later (Patient not taking: Reported on 12/12/2019), Disp: 4 tablet, Rfl: 5  Review of Systems   Constitutional: Negative for fever and chills Eyes: Negative for visual disturbances Respiratory: Negative for shortness of breath, dyspnea Cardiovascular: Negative for chest pain or palpitations  Gastrointestinal: Negative for vomiting, diarrhea and constipation Genitourinary: Negative for dysuria and urgency Musculoskeletal: Negative for back pain, joint pain, myalgias  Neurological: Negative for dizziness and headaches    Objective:     Vitals:   12/12/19 1131  BP: 112/75  Pulse: 74   General:  alert, cooperative and no distress   Breasts:  negative  Lungs: Normal respiratory effort  Heart:  regular rate and rhythm  Abdomen: Soft, nontender   Vulva:  has a furuncle (on the downside of healing) that she deals w/off and on for over a year.  Goes away for a while then comes back in exact same spot  Vagina: normal vagina  Cervix:  closed  Corpus: Well involuted     Rectal Exam: no hemorrhoids        Assessment:    normal postpartum exam.  Plan:   1. Contraception: oral progesterone-only contraceptive (has progressively worsening chin hair, so wants pills) 2. Follow up in:  For ? Excision of furuncle/follicle.

## 2019-12-16 ENCOUNTER — Encounter: Payer: Self-pay | Admitting: Family Medicine

## 2019-12-18 ENCOUNTER — Telehealth: Payer: Self-pay | Admitting: Advanced Practice Midwife

## 2019-12-18 NOTE — Telephone Encounter (Signed)
Attempted to call patient back left voicemail to call back or mychart message [place] to let [place] know what birth control she is wanting.

## 2019-12-18 NOTE — Telephone Encounter (Signed)
Pt states that she is no longer breast feeding as her baby has a milk protein allergy and would like to move forward with getting birth control. Please advise.

## 2019-12-18 NOTE — Telephone Encounter (Signed)
Pt states that Manus Gunning mentioned another pill she could take that would not cause the hair growth. Pt is wanting to see if that can be sent in and what the name of that pill would be. Pt no longer breast feeding.

## 2019-12-19 ENCOUNTER — Other Ambulatory Visit: Payer: Self-pay | Admitting: Advanced Practice Midwife

## 2019-12-19 MED ORDER — NORETHIN ACE-ETH ESTRAD-FE 1-20 MG-MCG(24) PO TABS: 1.0000 | ORAL_TABLET | Freq: Every day | ORAL | 11 refills | Status: DC

## 2019-12-19 NOTE — Progress Notes (Signed)
Loestrin d/t stopping breastfeeding

## 2020-02-03 ENCOUNTER — Ambulatory Visit: Payer: 59 | Admitting: Women's Health

## 2020-02-10 ENCOUNTER — Other Ambulatory Visit: Payer: Self-pay | Admitting: Women's Health

## 2020-02-10 MED ORDER — SERTRALINE HCL 100 MG PO TABS: ORAL_TABLET | ORAL | 3 refills | Status: DC

## 2020-02-12 ENCOUNTER — Ambulatory Visit: Payer: 59 | Admitting: Women's Health

## 2020-02-24 ENCOUNTER — Encounter: Payer: Self-pay | Admitting: Women's Health

## 2020-02-24 ENCOUNTER — Other Ambulatory Visit: Payer: Self-pay

## 2020-02-24 ENCOUNTER — Ambulatory Visit (INDEPENDENT_AMBULATORY_CARE_PROVIDER_SITE_OTHER): Payer: 59 | Admitting: Women's Health

## 2020-02-24 VITALS — BP 124/76 | HR 86 | Ht 61.0 in | Wt 162.4 lb

## 2020-02-24 DIAGNOSIS — Z3202 Encounter for pregnancy test, result negative: Secondary | ICD-10-CM

## 2020-02-24 DIAGNOSIS — Z30430 Encounter for insertion of intrauterine contraceptive device: Secondary | ICD-10-CM | POA: Diagnosis not present

## 2020-02-24 LAB — POCT URINE PREGNANCY: Preg Test, Ur: NEGATIVE

## 2020-02-24 MED ORDER — LEVONORGESTREL 19.5 MCG/DAY IU IUD: INTRAUTERINE_SYSTEM | Freq: Once | INTRAUTERINE | Status: AC

## 2020-02-24 NOTE — Progress Notes (Signed)
   IUD INSERTION Patient name: Cristina Davenport MRN [id number]  Date of birth: 1986-08-14 Subjective Findings:   Cristina Davenport is a 34 y.o. [phone] Caucasian female being seen today for insertion of a Liletta IUD.  Depression screen [facility] 2/9 05/10/2019 03/19/2019 02/28/2018 02/01/2017  Decreased Interest 0 0 1 1  Down, Depressed, Hopeless 0 0 0 2  PHQ - 2 Score 0 0 1 3  Altered sleeping 3 - 3 3  Tired, decreased energy 1 - 1 3  Change in appetite 0 - 0 1  Feeling bad or failure about yourself  0 - 0 1  Trouble concentrating 0 - 3 2  Moving slowly or fidgety/restless 0 - 3 0  Suicidal thoughts 0 - 0 0  PHQ-9 Score 4 - 11 13  Some recent data might be hidden    Patient's last menstrual period was 02/21/2020 (approximate). Last sexual intercourse was >2wks ago Last pap 2017. Results were:  normal  The risks and benefits of the method and placement have been thouroughly reviewed with the patient and all questions were answered.  Specifically the patient is aware of failure rate of 11/998, expulsion of the IUD and of possible perforation.  The patient is aware of irregular bleeding due to the method and understands the incidence of irregular bleeding diminishes with time.  Signed copy of informed consent in chart.  Pertinent History Reviewed:   Reviewed past medical,surgical, social, obstetrical and family history.  Reviewed problem list, medications and allergies. Objective Findings & Procedure:   Vitals:   02/24/20 0931  BP: 124/76  Pulse: 86  Weight: 162 lb 6.4 oz (73.7 kg)  Height: 5\' 1"  (1.549 m)  Body mass index is 30.69 kg/m.  Results for orders placed or performed in visit on 02/24/20 (from the past 24 hour(s))  POCT urine pregnancy   Collection Time: 02/24/20  9:36 AM  Result Value Ref Range   Preg Test, Ur Negative Negative     Time out was performed.  A graves speculum was placed in the vagina.  The cervix was visualized, prepped using Betadine, and grasped with a single  tooth tenaculum. The uterus was found to be retroflexed and it sounded to 9 cm.  Liletta  IUD placed per manufacturer's recommendations. The strings were trimmed to approximately 3 cm. The patient tolerated the procedure well.   Informal transvaginal sonogram was performed and the proper placement of the IUD was verified.  Chaperone: Levy Pupa   Assessment & Plan:   1) Liletta IUD insertion The patient was given post procedure instructions, including signs and symptoms of infection and to check for the strings after each menses or each month, and refraining from intercourse or anything in the vagina for 3 days. She was given a care card with date IUD placed, and date IUD to be removed. She is scheduled for a f/u appointment in 4 weeks.  Orders Placed This Encounter  Procedures  . POCT urine pregnancy    Return in about 4 weeks (around 03/23/2020) for Pap & physical & IUD check.  [place], [facility] 02/24/2020 10:09 AM

## 2020-02-24 NOTE — Patient Instructions (Signed)
 Nothing in vagina for 3 days (no sex, douching, tampons, etc...)  Check your strings once a month to make sure you can feel them, if you are not able to please let us know  If you develop a fever of 100.4 or more in the next few weeks, or if you develop severe abdominal pain, please let us know  Use a backup method of birth control, such as condoms, for 2 weeks     Intrauterine Device Insertion, Care After  This sheet gives you information about how to care for yourself after your procedure. Your health care provider may also give you more specific instructions. If you have problems or questions, contact your health care provider. What can I expect after the procedure? After the procedure, it is common to have:  Cramps and pain in the abdomen.  Light bleeding (spotting) or heavier bleeding that is like your menstrual period. This may last for up to a few days.  Lower back pain.  Dizziness.  Headaches.  Nausea. Follow these instructions at home:  Before resuming sexual activity, check to make sure that you can feel the IUD string(s). You should be able to feel the end of the string(s) below the opening of your cervix. If your IUD string is in place, you may resume sexual activity. ? If you had a hormonal IUD inserted more than 7 days after your most recent period started, you will need to use a backup method of birth control for 7 days after IUD insertion. Ask your health care provider whether this applies to you.  Continue to check that the IUD is still in place by feeling for the string(s) after every menstrual period, or once a month.  Take over-the-counter and prescription medicines only as told by your health care provider.  Do not drive or use heavy machinery while taking prescription pain medicine.  Keep all follow-up visits as told by your health care provider. This is important. Contact a health care provider if:  You have bleeding that is heavier or lasts longer  than a normal menstrual cycle.  You have a fever.  You have cramps or abdominal pain that get worse or do not get better with medicine.  You develop abdominal pain that is new or is not in the same area of earlier cramping and pain.  You feel lightheaded or weak.  You have abnormal or bad-smelling discharge from your vagina.  You have pain during sexual activity.  You have any of the following problems with your IUD string(s): ? The string bothers or hurts you or your sexual partner. ? You cannot feel the string. ? The string has gotten longer.  You can feel the IUD in your vagina.  You think you may be pregnant, or you miss your menstrual period.  You think you may have an STI (sexually transmitted infection). Get help right away if:  You have flu-like symptoms.  You have a fever and chills.  You can feel that your IUD has slipped out of place. Summary  After the procedure, it is common to have cramps and pain in the abdomen. It is also common to have light bleeding (spotting) or heavier bleeding that is like your menstrual period.  Continue to check that the IUD is still in place by feeling for the string(s) after every menstrual period, or once a month.  Keep all follow-up visits as told by your health care provider. This is important.  Contact your health care provider   if you have problems with your IUD string(s), such as the string getting longer or bothering you or your sexual partner. This information is not intended to replace advice given to you by your health care provider. Make sure you discuss any questions you have with your health care provider. Document Revised: 10/13/2017 Document Reviewed: 09/21/2016 Elsevier Patient Education  2020 [employer]  

## 2020-02-24 NOTE — Addendum Note (Signed)
Addended by: Linton Rump on: 02/24/2020 12:10 PM   Modules accepted: Orders

## 2020-03-06 ENCOUNTER — Telehealth: Payer: Self-pay | Admitting: *Deleted

## 2020-03-06 ENCOUNTER — Other Ambulatory Visit: Payer: Self-pay

## 2020-03-06 ENCOUNTER — Telehealth (INDEPENDENT_AMBULATORY_CARE_PROVIDER_SITE_OTHER): Payer: 59 | Admitting: Nurse Practitioner

## 2020-03-06 DIAGNOSIS — F419 Anxiety disorder, unspecified: Secondary | ICD-10-CM

## 2020-03-06 MED ORDER — SERTRALINE HCL 100 MG PO TABS: ORAL_TABLET | ORAL | 1 refills | Status: DC

## 2020-03-06 NOTE — Progress Notes (Signed)
  PHONE VISIT Subjective:    Patient ID: Cristina Davenport, female    DOB: 1986/10/16, 34 y.o.   MRN: [id number]  HPI  Patient calls for a follow up on anxiety. Patient states she is doing well on medication. See PHQ 9 and GAD 7.  Review of Systems  Depression screen [facility] 2/9 03/06/2020 05/10/2019 03/19/2019 02/28/2018 02/01/2017  Decreased Interest 0 0 0 1 1  Down, Depressed, Hopeless 0 0 0 0 2  PHQ - 2 Score 0 0 0 1 3  Altered sleeping 3 3 - 3 3  Tired, decreased energy 3 1 - 1 3  Change in appetite 3 0 - 0 1  Feeling bad or failure about yourself  0 0 - 0 1  Trouble concentrating 3 0 - 3 2  Moving slowly or fidgety/restless 3 0 - 3 0  Suicidal thoughts 0 0 - 0 0  PHQ-9 Score 15 4 - 11 13  Difficult doing work/chores Somewhat difficult - - - -  Some recent data might be hidden   GAD 7 : Generalized Anxiety Score 03/06/2020 08/17/2018 05/21/2018 01/06/2018  Nervous, Anxious, on Edge 3 2 2 3   Control/stop worrying 3 2 3 3   Worry too much - different things 3 2 3 3   Trouble relaxing 3 2 3 3   Restless 3 0 3 3  Easily annoyed or irritable 3 3 3 3   Afraid - awful might happen 3 0 3 3  Total GAD 7 Score 21 11 20 21   Anxiety Difficulty Very difficult Somewhat difficult Extremely difficult Extremely difficult   Mood overall better. 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 Has caused significant problems. Has a chronic history of difficulty focusing as far back as high school or earlier. 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       Objective:   Physical Exam Today's visit was via telephone Physical exam was not possible for this visit Alert, oriented. Mildly anxious affect. Thoughts logical, coherent and relevant.      Assessment & Plan:   Problem List Items Addressed This Visit      Other   Anxiety -  Primary   Relevant Medications   sertraline (ZOLOFT) 100 MG tablet     Meds ordered this encounter  Medications  . sertraline (ZOLOFT) 100 MG tablet    Sig: TAKE 1 & 1/2 (ONE & ONE-HALF) TABLETS BY MOUTH ONCE DAILY    Dispense:  135 tablet    Refill:  1    Order Specific Question:   Supervising Provider    Answer:   Sallee Lange A [[id number]]   Continue Zoloft at current dose.  A copy of the adult ADHD screen printed and left up front for patient to complete per her request.  Recommend in office visit to review screen and discuss further.

## 2020-03-06 NOTE — Telephone Encounter (Signed)
Ms. Cristina Davenport are scheduled for a virtual visit with your provider today.    Just as we do with appointments in the office, we must obtain your consent to participate.  Your consent will be active for this visit and any virtual visit you may have with one of our providers in the next 365 days.    If you have a MyChart account, I can also send a copy of this consent to you electronically.  All virtual visits are billed to your insurance company just like a traditional visit in the office.  As this is a virtual visit, video technology does not allow for your provider to perform a traditional examination.  This may limit your provider's ability to fully assess your condition.  If your provider identifies any concerns that need to be evaluated in person or the need to arrange testing such as labs, EKG, etc, we will make arrangements to do so.    Although advances in technology are sophisticated, we cannot ensure that it will always work on either your end or our end.  If the connection with a video visit is poor, we may have to switch to a telephone visit.  With either a video or telephone visit, we are not always able to ensure that we have a secure connection.   I need to obtain your verbal consent now.   Are you willing to proceed with your visit today?   Cristina Davenport has provided verbal consent on 03/06/2020 for a virtual visit (video or telephone).   Mitzie Na, RN 03/06/2020  9:10 AM

## 2020-03-07 ENCOUNTER — Encounter: Payer: Self-pay | Admitting: Nurse Practitioner

## 2020-03-13 ENCOUNTER — Telehealth: Payer: Self-pay | Admitting: Family Medicine

## 2020-03-13 NOTE — Telephone Encounter (Signed)
Pt dropped off Adult ADHD Self-Report Scale, put on Carolyn's desk

## 2020-03-14 ENCOUNTER — Telehealth: Payer: Self-pay | Admitting: Nurse Practitioner

## 2020-03-14 NOTE — Telephone Encounter (Signed)
Based on [facility] screening form for ADD/ADHD, recommend office visit to discuss with possible EKG if she needs medicine. Thanks. Hoyle Sauer

## 2020-03-14 NOTE — Telephone Encounter (Signed)
Patient has completed ADD/ADHD adult screening. See results.

## 2020-03-16 NOTE — Telephone Encounter (Signed)
Pt notified of Carolyn's recommendation and agreed to schedule an appointment.

## 2020-03-20 ENCOUNTER — Other Ambulatory Visit: Payer: Self-pay

## 2020-03-20 ENCOUNTER — Ambulatory Visit (INDEPENDENT_AMBULATORY_CARE_PROVIDER_SITE_OTHER): Payer: 59 | Admitting: Nurse Practitioner

## 2020-03-20 ENCOUNTER — Encounter: Payer: Self-pay | Admitting: Nurse Practitioner

## 2020-03-20 DIAGNOSIS — F902 Attention-deficit hyperactivity disorder, combined type: Secondary | ICD-10-CM | POA: Insufficient documentation

## 2020-03-20 DIAGNOSIS — Z79899 Other long term (current) drug therapy: Secondary | ICD-10-CM

## 2020-03-20 MED ORDER — AMPHETAMINE-DEXTROAMPHET ER 10 MG PO CP24: 10.0000 mg | ORAL_CAPSULE | Freq: Every day | ORAL | 0 refills | Status: DC

## 2020-03-20 NOTE — Progress Notes (Signed)
   Subjective:    Patient ID: Cristina Davenport, female    DOB: [birth year], 34 y.o.   MRN: [id number]  HPI  Presents to discuss ADHD options. States that she continues to have difficulty remembering appointments and obligations, makes careless mistakes and often finishes others sentences and interrupts conversations. Notes this issue has been ongoing for many years and with new job, these issues present to be more concern. Also notes tht she has struggled all through both middle and high school and focusing is not of new onset. States that she does not follow a routine exercise and diet plan. States she consumes alcohol socially and utilizes a vape. States she is being treated for anxiety and current medication regimen is effective. She has three children and has had the same sex partner for 9 years. She had an IUD placed 2 weeks ago. Menstrual cycles have been irregular since IUD was placed.     Review of Systems  Constitutional: Negative for appetite change, fatigue, fever and unexpected weight change.  Respiratory: Negative for cough, shortness of breath and wheezing.   Cardiovascular: Negative for chest pain and palpitations.  Gastrointestinal: Negative for abdominal pain, diarrhea, nausea and vomiting.  Musculoskeletal: Negative for arthralgias and myalgias.  Neurological: Negative for tremors, seizures, weakness and headaches.  Psychiatric/Behavioral: Negative for agitation. The patient is not nervous/anxious.        Objective:   Physical Exam Vitals reviewed.  Constitutional:      Appearance: Normal appearance. She is well-developed and well-groomed.  Cardiovascular:     Heart sounds: Normal heart sounds, S1 normal and S2 normal. No murmur. No friction rub. No gallop.   Pulmonary:     Effort: Pulmonary effort is normal.     Breath sounds: Normal breath sounds.  Neurological:     Mental Status: She is alert.  Psychiatric:        Behavior: Behavior is cooperative.    Electrocardiogram reviewed: NSR.  See ASRS Self-Report Scale     Assessment & Plan:   Problem List Items Addressed This Visit      Other   Attention deficit hyperactivity disorder (ADHD), combined type - Primary    Other Visit Diagnoses    High risk medication use       Relevant Orders   PR ELECTROCARDIOGRAM, COMPLETE       Meds ordered this encounter  Medications  . amphetamine-dextroamphetamine (ADDERALL XR) 10 MG 24 hr capsule    Sig: Take 1 capsule (10 mg total) by mouth daily.    Dispense:  30 capsule    Refill:  0    Order Specific Question:   Supervising Provider    Answer:   Kathyrn Drown [id number]   Start Adderall as directed. Plan to titrate dose if needed and tolerated. DC med and contact office if any problems.  Education provided related to possible adverse reactions to new medication ordered (Adderall XR) Education provided related to sleep issues and timing of Adderall XR administration Contact office in 30 days for update of Adderall XR Follow up in 3 months

## 2020-03-20 NOTE — Progress Notes (Signed)
   Subjective:    Patient ID: Cristina Davenport, female    DOB: 05-10-1986, 34 y.o.   MRN: [id number]  HPI  Patient arrives to discuss ADHD.   Review of Systems     Objective:   Physical Exam        Assessment & Plan:

## 2020-03-23 ENCOUNTER — Other Ambulatory Visit: Payer: 59 | Admitting: Women's Health

## 2020-03-24 ENCOUNTER — Encounter: Payer: Self-pay | Admitting: Family Medicine

## 2020-03-29 ENCOUNTER — Encounter: Payer: Self-pay | Admitting: Nurse Practitioner

## 2020-03-30 ENCOUNTER — Other Ambulatory Visit: Payer: Self-pay | Admitting: Nurse Practitioner

## 2020-03-30 MED ORDER — AMPHETAMINE-DEXTROAMPHET ER 20 MG PO CP24: 20.0000 mg | ORAL_CAPSULE | ORAL | 0 refills | Status: DC

## 2020-04-23 ENCOUNTER — Other Ambulatory Visit: Payer: 59 | Admitting: Women's Health

## 2020-05-10 ENCOUNTER — Other Ambulatory Visit: Payer: Self-pay | Admitting: Nurse Practitioner

## 2020-05-11 MED ORDER — AMPHETAMINE-DEXTROAMPHET ER 20 MG PO CP24: 20.0000 mg | ORAL_CAPSULE | ORAL | 0 refills | Status: DC

## 2020-05-22 ENCOUNTER — Other Ambulatory Visit: Payer: 59 | Admitting: Women's Health

## 2020-06-18 ENCOUNTER — Other Ambulatory Visit: Payer: Self-pay | Admitting: Nurse Practitioner

## 2020-06-18 NOTE — Telephone Encounter (Signed)
Last med check up 03/20/20

## 2020-06-19 ENCOUNTER — Encounter: Payer: Self-pay | Admitting: Nurse Practitioner

## 2020-06-19 ENCOUNTER — Telehealth: Payer: Self-pay | Admitting: *Deleted

## 2020-06-19 ENCOUNTER — Telehealth (INDEPENDENT_AMBULATORY_CARE_PROVIDER_SITE_OTHER): Payer: 59 | Admitting: Nurse Practitioner

## 2020-06-19 ENCOUNTER — Other Ambulatory Visit: Payer: Self-pay

## 2020-06-19 DIAGNOSIS — F419 Anxiety disorder, unspecified: Secondary | ICD-10-CM | POA: Diagnosis not present

## 2020-06-19 DIAGNOSIS — F902 Attention-deficit hyperactivity disorder, combined type: Secondary | ICD-10-CM | POA: Diagnosis not present

## 2020-06-19 MED ORDER — HYDROXYZINE HCL 10 MG PO TABS: ORAL_TABLET | ORAL | 0 refills | Status: DC

## 2020-06-19 MED ORDER — AMPHETAMINE-DEXTROAMPHET ER 20 MG PO CP24: 20.0000 mg | ORAL_CAPSULE | ORAL | 0 refills | Status: DC

## 2020-06-19 NOTE — Progress Notes (Signed)
Subjective:    Patient ID: Cristina Davenport, female    DOB: 04/05/1986, 34 y.o.   MRN: [id number]  HPIADD check up. Pt states no concerns today.   Takes sertraline. phq9 done.     Virtual Visit via Telephone Note  I connected with Cristina Davenport on 06/19/20 at  9:20 AM EDT by telephone and verified that I am speaking with the correct person using two identifiers.  Location: Patient: home Provider: office   I discussed the limitations, risks, security and privacy concerns of performing an evaluation and management service by telephone and the availability of in person appointments. I also discussed with the patient that there may be a patient responsible charge related to this service. The patient expressed understanding and agreed to proceed. Overall doing well. Using CBD gummies at night to help her sleep which works well. Has a history of chronic insomnia since childhood. Adderall helps her focus at work. Under stress now but feels it will improve with her children going back to daycare and school. Defers counseling. Has done this in the past but did not feel it was helpful. Has a great support system in her sister. Denies suicidal or homicidal thoughts or ideation. Having a difficult time with irritability especially with her husband. Still has spells of anxiety at times.   History of Present Illness: Depression screen [facility] 2/9 06/19/2020 03/06/2020 05/10/2019 03/19/2019 02/28/2018  Decreased Interest 0 0 0 0 1  Down, Depressed, Hopeless 1 0 0 0 0  PHQ - 2 Score 1 0 0 0 1  Altered sleeping 3 3 3  - 3  Tired, decreased energy 1 3 1  - 1  Change in appetite 0 3 0 - 0  Feeling bad or failure about yourself  1 0 0 - 0  Trouble concentrating 1 3 0 - 3  Moving slowly or fidgety/restless 1 3 0 - 3  Suicidal thoughts 0 0 0 - 0  PHQ-9 Score 8 15 4  - 11  Difficult doing work/chores Somewhat difficult Somewhat difficult - - -  Some recent data might be hidden   GAD 7 : Generalized Anxiety Score  06/19/2020 03/06/2020 08/17/2018 05/21/2018  Nervous, Anxious, on Edge 3 3 2 2   Control/stop worrying 3 3 2 3   Worry too much - different things 3 3 2 3   Trouble relaxing 1 3 2 3   Restless 3 3 0 3  Easily annoyed or irritable 3 3 3 3   Afraid - awful might happen 1 3 0 3  Total GAD 7 Score 17 21 11 20   Anxiety Difficulty Very difficult Very difficult Somewhat difficult Extremely difficult        Observations/Objective: Today's visit was via telephone Physical exam was not possible for this visit Alert, oriented. Calm affect. Thoughts logical, coherent and relevant.   Assessment and Plan: Problem List Items Addressed This Visit      Other   Anxiety   Attention deficit hyperactivity disorder (ADHD), combined type - Primary     Meds ordered this encounter  Medications  . hydrOXYzine (ATARAX/VISTARIL) 10 MG tablet    Sig: Take 1/2-1 tab po TID prn anxiety    Dispense:  30 tablet    Refill:  0    Order Specific Question:   Supervising Provider    Answer:   Sallee Lange A [[id number]]  . DISCONTD: amphetamine-dextroamphetamine (ADDERALL XR) 20 MG 24 hr capsule    Sig: Take 1 capsule (20 mg total) by mouth every morning.  Dispense:  30 capsule    Refill:  0    Order Specific Question:   Supervising Provider    Answer:   Sallee Lange A [[id number]]  . DISCONTD: amphetamine-dextroamphetamine (ADDERALL XR) 20 MG 24 hr capsule    Sig: Take 1 capsule (20 mg total) by mouth every morning.    Dispense:  30 capsule    Refill:  0    May fill 30 days from 06/19/20    Order Specific Question:   Supervising Provider    Answer:   Sallee Lange A [[id number]]  . amphetamine-dextroamphetamine (ADDERALL XR) 20 MG 24 hr capsule    Sig: Take 1 capsule (20 mg total) by mouth every morning.    Dispense:  30 capsule    Refill:  0    May fill 60 days from 06/19/20    Order Specific Question:   Supervising Provider    Answer:   Sallee Lange A [[id number]]     Follow Up Instructions: Trial of low dose hydroxyzine for  anxiety. Cautioned about drowsiness. Continue Zoloft and Adderall at current doses.  Return in about 3 months (around 09/19/2020) for ADHD and anxiety follow up.    I discussed the assessment and treatment plan with the patient. The patient was provided an opportunity to ask questions and all were answered. The patient agreed with the plan and demonstrated an understanding of the instructions.   The patient was advised to call back or seek an in-person evaluation if the symptoms worsen or if the condition fails to improve as anticipated.  I provided 15 minutes of non-face-to-face time during this encounter.      Review of Systems     Objective:   Physical Exam        Assessment & Plan:

## 2020-06-19 NOTE — Telephone Encounter (Signed)
Ms. Brett Fairy are scheduled for a virtual visit with your provider today.    Just as we do with appointments in the office, we must obtain your consent to participate.  Your consent will be active for this visit and any virtual visit you may have with one of our providers in the next 365 days.    If you have a MyChart account, I can also send a copy of this consent to you electronically.  All virtual visits are billed to your insurance company just like a traditional visit in the office.  As this is a virtual visit, video technology does not allow for your provider to perform a traditional examination.  This may limit your provider's ability to fully assess your condition.  If your provider identifies any concerns that need to be evaluated in person or the need to arrange testing such as labs, EKG, etc, we will make arrangements to do so.    Although advances in technology are sophisticated, we cannot ensure that it will always work on either your end or our end.  If the connection with a video visit is poor, we may have to switch to a telephone visit.  With either a video or telephone visit, we are not always able to ensure that we have a secure connection.   I need to obtain your verbal consent now.   Are you willing to proceed with your visit today?   WENDELYN KIESLING has provided verbal consent on 06/19/2020 for a virtual visit (video or telephone).   Dayton Bailiff, LPN 07/21/262  7:85 AM

## 2020-06-23 ENCOUNTER — Other Ambulatory Visit: Payer: Self-pay | Admitting: Nurse Practitioner

## 2020-06-23 NOTE — Telephone Encounter (Signed)
Med check up 06/19/20

## 2020-07-07 ENCOUNTER — Encounter: Payer: Self-pay | Admitting: Nurse Practitioner

## 2020-07-10 ENCOUNTER — Other Ambulatory Visit: Payer: Self-pay | Admitting: Nurse Practitioner

## 2020-07-10 MED ORDER — CLONAZEPAM 0.5 MG PO TABS: ORAL_TABLET | ORAL | 1 refills | Status: DC

## 2020-08-12 ENCOUNTER — Encounter: Payer: Self-pay | Admitting: Nurse Practitioner

## 2020-08-13 ENCOUNTER — Other Ambulatory Visit: Payer: Self-pay

## 2020-08-13 ENCOUNTER — Encounter: Payer: Self-pay | Admitting: *Deleted

## 2020-08-13 ENCOUNTER — Telehealth (INDEPENDENT_AMBULATORY_CARE_PROVIDER_SITE_OTHER): Payer: 59 | Admitting: Family Medicine

## 2020-08-13 DIAGNOSIS — B349 Viral infection, unspecified: Secondary | ICD-10-CM

## 2020-08-13 NOTE — Progress Notes (Signed)
   Subjective:    Patient ID: Cristina Davenport, female    DOB: [birth year], 34 y.o.   MRN: [id number]  Cough This is a new problem. Episode onset: yesterday morning. The cough is productive of sputum. Associated symptoms include headaches, nasal congestion, postnasal drip and a sore throat. Associated symptoms comments: Back pain, neck pain . Treatments tried: mucinex.  At home test and rapid test at [facility] negative.  Daughter has strep throat.      Virtual Visit via Video Note  I connected with Cristina Davenport on 08/13/20 at 11:00 AM EDT by a video enabled telemedicine application and verified that I am speaking with the correct person using two identifiers.  Location: Patient: home Provider: office   I discussed the limitations of evaluation and management by telemedicine and the availability of in person appointments. The patient expressed understanding and agreed to proceed.  History of Present Illness:    Observations/Objective:   Assessment and Plan:   Follow Up Instructions:    I discussed the assessment and treatment plan with the patient. The patient was provided an opportunity to ask questions and all were answered. The patient agreed with the plan and demonstrated an understanding of the instructions.   The patient was advised to call back or seek an in-person evaluation if the symptoms worsen or if the condition fails to improve as anticipated.  I provided 15 minutes of non-face-to-face time during this encounter.   Review of Systems  HENT: Positive for postnasal drip and sore throat.   Respiratory: Positive for cough.   Neurological: Positive for headaches.       Objective:   Physical Exam  Today's visit was via telephone Physical exam was not possible for this visit       Assessment & Plan:  Telephone visit I believe patient does have some symptoms that go along with Covid I encouraged her to do the best she can have resting up over the next few  days Also recommend for her to do a more accurate Covid test including PCR [place] signs what to watch for was discussed Self-isolation until she knows results If she should get worse she is notify us This visit is no charge Our office was extremely busy that day and she had to wait a inordinate amount of time so therefore no charge

## 2020-08-21 ENCOUNTER — Other Ambulatory Visit: Payer: Self-pay | Admitting: Nurse Practitioner

## 2020-08-22 ENCOUNTER — Other Ambulatory Visit: Payer: Self-pay | Admitting: Nurse Practitioner

## 2020-09-28 ENCOUNTER — Telehealth: Payer: Self-pay | Admitting: *Deleted

## 2020-09-28 ENCOUNTER — Telehealth: Payer: Self-pay

## 2020-09-28 MED ORDER — SERTRALINE HCL 100 MG PO TABS: ORAL_TABLET | ORAL | 0 refills | Status: DC

## 2020-09-28 MED ORDER — CLONAZEPAM 0.5 MG PO TABS: ORAL_TABLET | ORAL | 0 refills | Status: DC

## 2020-09-28 MED ORDER — AMPHETAMINE-DEXTROAMPHET ER 20 MG PO CP24: 20.0000 mg | ORAL_CAPSULE | ORAL | 0 refills | Status: DC

## 2020-09-28 NOTE — Telephone Encounter (Signed)
Patient tested positive on 11/10 and had to cancel her appointment for 11/17 for medication follow up needing refills on all medication . She wants to schedule when she is cleared to come in .

## 2020-09-28 NOTE — Telephone Encounter (Signed)
Patient scheduled follow up appointment for 12/15. She requesting refills on all medications please.

## 2020-09-28 NOTE — Telephone Encounter (Signed)
Cristina Davenport 1 hour ago (12:04 PM)  EM   Patient tested positive on 11/10 and had to cancel her appointment for 11/17 for medication follow up needing refills on all medication . She wants to schedule when she is cleared to come in .

## 2020-09-28 NOTE — Telephone Encounter (Signed)
Pt needs appt in 25-30 days. Thanks, dr. Lovena Le

## 2020-09-30 ENCOUNTER — Ambulatory Visit: Payer: 59 | Admitting: Family Medicine

## 2020-10-28 ENCOUNTER — Other Ambulatory Visit: Payer: Self-pay | Admitting: Nurse Practitioner

## 2020-10-28 ENCOUNTER — Other Ambulatory Visit: Payer: Self-pay

## 2020-10-28 ENCOUNTER — Encounter: Payer: Self-pay | Admitting: Family Medicine

## 2020-10-28 ENCOUNTER — Ambulatory Visit (INDEPENDENT_AMBULATORY_CARE_PROVIDER_SITE_OTHER): Payer: 59 | Admitting: Family Medicine

## 2020-10-28 VITALS — BP 122/78 | HR 106 | Temp 97.5°F | Ht 61.0 in | Wt 142.2 lb

## 2020-10-28 DIAGNOSIS — Z79899 Other long term (current) drug therapy: Secondary | ICD-10-CM

## 2020-10-28 DIAGNOSIS — F902 Attention-deficit hyperactivity disorder, combined type: Secondary | ICD-10-CM

## 2020-10-28 DIAGNOSIS — F419 Anxiety disorder, unspecified: Secondary | ICD-10-CM | POA: Diagnosis not present

## 2020-10-28 MED ORDER — AMPHETAMINE-DEXTROAMPHET ER 20 MG PO CP24: 20.0000 mg | ORAL_CAPSULE | ORAL | 0 refills | Status: DC

## 2020-10-28 MED ORDER — SERTRALINE HCL 100 MG PO TABS: ORAL_TABLET | ORAL | 2 refills | Status: DC

## 2020-10-28 NOTE — Progress Notes (Signed)
Patient ID: Cristina Davenport, female    DOB: 10/19/1986, 34 y.o.   MRN: [id number]   Chief Complaint  Patient presents with  . ADHD   Subjective:    HPI Patient was seen today for ADD checkup.  This patient does have ADD.  Patient takes medications for this.  If this does help control overall symptoms.  Please see below. -weight, vital signs reviewed.  The following items were covered. 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 Taking adderal 20mg  daily, only just started this medication over last 5 months. Pt having a hard time with contracts.  Hard time with meetings, and hard to read things and needing to listen to it. Pt thought was a slow learner.  No dx as a child with ADD or adhd. Taking klonapin at night. Pt was on hydroxyzine during the day due to too sedating. Didn't have anxiety till having a child. Has 2 little kids.  Long standing issue in past with insomnia. Used to take trazodone before birth of her son.  Using melatonin mostly. Just taking prn klonapin and okay with not having it daily. Not had it in 2 wks.   No chest pain, palp.  Does have jitteriness occ when don't eat with meds.  Taking adderall 20mg  xr 8-10am.  Pharm- [facility] .  Medical History Cristina Davenport has a past medical history of Anxiety, Anxiety, Bipolar 1 disorder ([place]), GERD (gastroesophageal reflux disease), Hypertension, IBS (irritable bowel syndrome), and Migraine without aura.   Outpatient Encounter Medications as of 10/28/2020  Medication Sig  . clonazePAM (KLONOPIN) 0.5 MG tablet Take 1/2-1 tab po BID prn severe anxiety  . valACYclovir (VALTREX) 1000 MG tablet Take 1,000 mg by mouth as needed.  .  [DISCONTINUED] amphetamine-dextroamphetamine (ADDERALL XR) 20 MG 24 hr capsule Take 1 capsule (20 mg total) by mouth every morning.  . [DISCONTINUED] sertraline (ZOLOFT) 100 MG tablet TAKE 1 & 1/2 (ONE & ONE-HALF) TABLETS BY MOUTH ONCE DAILY  . amphetamine-dextroamphetamine (ADDERALL XR) 20 MG 24 hr capsule Take 1 capsule (20 mg total) by mouth every morning.  Marland Kitchen amphetamine-dextroamphetamine (ADDERALL XR) 20 MG 24 hr capsule Take 1 capsule (20 mg total) by mouth every morning.  Marland Kitchen amphetamine-dextroamphetamine (ADDERALL XR) 20 MG 24 hr capsule Take 1 capsule (20 mg total) by mouth every morning.  . sertraline (ZOLOFT) 100 MG tablet TAKE 1 & 1/2 (ONE & ONE-HALF) TABLETS BY MOUTH ONCE DAILY   No facility-administered encounter medications on file as of 10/28/2020.     Review of Systems  Constitutional: Negative for chills and fever.  HENT: Negative for congestion, rhinorrhea and sore throat.   Respiratory: Negative for cough, shortness of breath and wheezing.   Cardiovascular: Negative for chest pain and leg swelling.  Gastrointestinal: Negative for abdominal pain, diarrhea, nausea and vomiting.  Genitourinary: Negative for dysuria and frequency.  Musculoskeletal: Negative for arthralgias and back pain.  Skin: Negative for rash.  Neurological: Negative for dizziness, weakness and headaches.  Psychiatric/Behavioral: Positive for decreased concentration (imporved with meds) and sleep disturbance. Negative for dysphoric mood. The patient is nervous/anxious.      Vitals BP 122/78   Pulse (!) 106   Temp (!) 97.5  F (36.4 C)   Ht 5\' 1"  (1.549 m)   Wt 142 lb 3.2 oz (64.5 kg)   SpO2 99%   BMI 26.87 kg/m   Objective:   Physical Exam Vitals and nursing note reviewed.  Constitutional:      Appearance: Normal appearance.  HENT:     Head: Normocephalic and atraumatic.     Nose: Nose normal.     Mouth/Throat:     Mouth: Mucous membranes are moist.     Pharynx: Oropharynx is clear.   Eyes:     Extraocular Movements: Extraocular movements intact.     Conjunctiva/sclera: Conjunctivae normal.     Pupils: Pupils are equal, round, and reactive to light.  Cardiovascular:     Rate and Rhythm: Regular rhythm. Tachycardia present.     Pulses: Normal pulses.     Heart sounds: Normal heart sounds.  Pulmonary:     Effort: Pulmonary effort is normal.     Breath sounds: Normal breath sounds. No wheezing, rhonchi or rales.  Musculoskeletal:        General: Normal range of motion.     Right lower leg: No edema.     Left lower leg: No edema.  Skin:    General: Skin is warm and dry.     Findings: No lesion or rash.  Neurological:     General: No focal deficit present.     Mental Status: She is alert and oriented to person, place, and time.  Psychiatric:        Mood and Affect: Mood normal.        Behavior: Behavior normal.      Assessment and Plan   1. Attention deficit hyperactivity disorder (ADHD), combined type  2. Anxiety  3. High risk medication use - ToxASSURE Select 13 (MW), Urine   ADHD- pt stable, doing well on adderall 20mg  xr.  Will continue.  Anxiety/insomnia- Pt only taking klonapin prn, last script for 15 tab.    Advising not wanting to continue with klonapin long term and stimulants together. Would recommend another medication for sleep like hydroxyzine or trazodone. Pt stating only taking klonapin prn.  If just prn, then will cont to monitor for now.  F/u 46mo.

## 2020-11-05 LAB — TOXASSURE SELECT 13 (MW), URINE

## 2020-11-20 ENCOUNTER — Encounter: Payer: Self-pay | Admitting: Nurse Practitioner

## 2020-11-20 NOTE — Telephone Encounter (Signed)
Pt states no sucidial thoughts but didn't want to wait months for referral

## 2020-11-27 ENCOUNTER — Other Ambulatory Visit: Payer: Self-pay | Admitting: Nurse Practitioner

## 2020-11-30 ENCOUNTER — Other Ambulatory Visit: Payer: Self-pay

## 2020-12-01 MED ORDER — AMPHETAMINE-DEXTROAMPHET ER 20 MG PO CP24: 20.0000 mg | ORAL_CAPSULE | ORAL | 0 refills | Status: DC

## 2020-12-01 NOTE — Telephone Encounter (Signed)
Would recommend hydroxyzine for sleep.  Not wanting to continue clonazepam and adderall together.  Discussed this on last visit.  Let me know if she wants hydroxyzine.  Thx. Dr. Lovena Le

## 2020-12-02 MED ORDER — HYDROXYZINE HCL 25 MG PO TABS: 25.0000 mg | ORAL_TABLET | Freq: Every evening | ORAL | 1 refills | Status: DC | PRN

## 2020-12-02 NOTE — Telephone Encounter (Signed)
MyChart message sent to patient.

## 2020-12-09 ENCOUNTER — Other Ambulatory Visit: Payer: 59

## 2020-12-09 DIAGNOSIS — Z20822 Contact with and (suspected) exposure to covid-19: Secondary | ICD-10-CM

## 2020-12-10 LAB — NOVEL CORONAVIRUS, NAA: SARS-CoV-2, NAA: NOT DETECTED

## 2020-12-10 LAB — SARS-COV-2, NAA 2 DAY TAT

## 2020-12-25 ENCOUNTER — Other Ambulatory Visit: Payer: Self-pay | Admitting: Family Medicine

## 2021-01-01 ENCOUNTER — Other Ambulatory Visit: Payer: Self-pay

## 2021-01-04 ENCOUNTER — Other Ambulatory Visit: Payer: Self-pay

## 2021-01-06 ENCOUNTER — Other Ambulatory Visit: Payer: Self-pay

## 2021-01-06 MED ORDER — AMPHETAMINE-DEXTROAMPHET ER 20 MG PO CP24: 20.0000 mg | ORAL_CAPSULE | ORAL | 0 refills | Status: DC

## 2021-01-06 NOTE — Telephone Encounter (Signed)
Pt contacted and verbalized understanding.  

## 2021-01-06 NOTE — Telephone Encounter (Signed)
amphetamine-dextroamphetamine (ADDERALL XR) 20 MG 24 hr capsule

## 2021-01-06 NOTE — Telephone Encounter (Signed)
done

## 2021-01-06 NOTE — Telephone Encounter (Signed)
Patient calling checking on her rx for adderall that she requested last week.  She said she is already out.  See my chart message.

## 2021-01-18 DIAGNOSIS — Z029 Encounter for administrative examinations, unspecified: Secondary | ICD-10-CM

## 2021-01-19 ENCOUNTER — Ambulatory Visit (INDEPENDENT_AMBULATORY_CARE_PROVIDER_SITE_OTHER): Payer: 59 | Admitting: Internal Medicine

## 2021-01-19 ENCOUNTER — Other Ambulatory Visit: Payer: Self-pay

## 2021-01-19 ENCOUNTER — Encounter (INDEPENDENT_AMBULATORY_CARE_PROVIDER_SITE_OTHER): Payer: Self-pay | Admitting: Internal Medicine

## 2021-01-19 VITALS — BP 114/68 | HR 105 | Temp 98.0°F | Ht 62.0 in | Wt 138.0 lb

## 2021-01-19 DIAGNOSIS — N943 Premenstrual tension syndrome: Secondary | ICD-10-CM

## 2021-01-19 DIAGNOSIS — F902 Attention-deficit hyperactivity disorder, combined type: Secondary | ICD-10-CM | POA: Diagnosis not present

## 2021-01-19 DIAGNOSIS — E282 Polycystic ovarian syndrome: Secondary | ICD-10-CM | POA: Diagnosis not present

## 2021-01-19 DIAGNOSIS — R6889 Other general symptoms and signs: Secondary | ICD-10-CM | POA: Diagnosis not present

## 2021-01-19 DIAGNOSIS — E785 Hyperlipidemia, unspecified: Secondary | ICD-10-CM | POA: Diagnosis not present

## 2021-01-19 MED ORDER — PROGESTERONE 200 MG PO CAPS: 200.0000 mg | ORAL_CAPSULE | Freq: Every day | ORAL | 3 refills | Status: DC

## 2021-01-19 NOTE — Progress Notes (Signed)
Metrics: Intervention Frequency ACO  Documented Smoking Status Yearly  Screened one or more times in 24 months  Cessation Counseling or  Active cessation medication Past 24 months  Past 24 months   Guideline developer: UpToDate (See UpToDate for funding source) Date Released: 2014       Wellness Office Visit  Subjective:  Patient ID: Cristina Davenport, female    DOB: December 08, 1985  Age: 35 y.o. MRN: [id number]  CC: This pleasant 35 year old lady comes in to establish care.  HPI  She is having a lot of depression symptoms and she is seeking psychiatric help and has an appointment for this. She has been diagnosed with bipolar disorder in the past. She also appears to have been diagnosed with ADD and takes Adderall. She takes Zoloft also for her depression. She describes classical symptoms of premenstrual syndrome.  She has an IUD in place. She also describes hirsutism and acne and previous history of a miscarriage.  She also has a history of irregular cycles. She also describes a degree of cold intolerance, hair loss and dry skin.  She has a mental fog. She also discussed her relationship with her husband which is somewhat difficult for her at the present time.  They seem to disagree on many aspects of life and childrearing. Past Medical History:  Diagnosis Date  . Anxiety   . Anxiety   . Bipolar 1 disorder ([place])   . GERD (gastroesophageal reflux disease)   . Hypertension    PIH  . IBS (irritable bowel syndrome)   . Migraine without aura    Past Surgical History:  Procedure Laterality Date  . ABDOMINAL SURGERY     cyst removed   . CHOLECYSTECTOMY N/A 07/10/2013   Procedure: LAPAROSCOPIC CHOLECYSTECTOMY;  Surgeon: Donato Heinz, MD;  Location: [facility];  Service: General;  Laterality: N/A;  . ESOPHAGOGASTRODUODENOSCOPY N/A 04/30/2013   JJH:[id number] distal esophagus of uncertain clinical significance-status post biopsy. Tiny hiatal hernia. No explanation  . EXAMINATION UNDER  ANESTHESIA  04/21/2012   Gluteal wound repair  . WISDOM TOOTH EXTRACTION       Family History  Adopted: Yes  Problem Relation Age of Onset  . Mental illness Mother   . Cancer Father        skin cancer  . Hypertension Maternal Grandmother   . Stroke Maternal Grandmother   . Kidney failure Maternal Grandmother   . Hypertension Maternal Grandfather   . Stroke Maternal Grandfather   . Cancer Maternal Grandfather        skin  . Crohn's disease Cousin   . Colon cancer Neg Hx     Social History   Social History Narrative   Married since July 2020,been together for 10 years.Lives with husband and kids.   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   Social History   Tobacco Use  . Smoking status: Former Smoker    Packs/day: 0.00    Years: 8.00    Pack years: 0.00    Types: Cigarettes    Quit date: 09/19/2014    Years since quitting: 6.3  . Smokeless tobacco: Never Used  Substance Use Topics  . Alcohol use: Yes    Alcohol/week: 3.0 standard drinks    Types: 3 Glasses of wine per week    Current Meds  Medication Sig  . amphetamine-dextroamphetamine (ADDERALL XR) 20 MG 24 hr capsule Take 1 capsule (20 mg total) by mouth every morning.  Marland Kitchen amphetamine-dextroamphetamine (ADDERALL XR) 20 MG 24  hr capsule Take 1 capsule (20 mg total) by mouth every morning.  Marland Kitchen amphetamine-dextroamphetamine (ADDERALL XR) 20 MG 24 hr capsule Take 1 capsule (20 mg total) by mouth every morning.  . hydrOXYzine (ATARAX/VISTARIL) 25 MG tablet TAKE 1 TABLET BY MOUTH AT BEDTIME AS NEEDED.  Marland Kitchen levonorgestrel (LILETTA, 52 MG,) 19.5 MCG/DAY IUD IUD 1 each by Intrauterine route once.  . progesterone (PROMETRIUM) 200 MG capsule Take 1 capsule (200 mg total) by mouth daily.  . sertraline (ZOLOFT) 100 MG tablet TAKE 1 & 1/2 (ONE & ONE-HALF) TABLETS BY MOUTH ONCE DAILY  . valACYclovir (VALTREX) 1000 MG tablet Take 1,000 mg by mouth as needed.     [place] Office Visit from 01/19/2021 in  [facility]  PHQ-9 Total Score 18      Objective:   Today's Vitals: BP 114/68   Pulse (!) 105   Temp 98 F (36.7 C) (Temporal)   Ht 5\' 2"  (1.575 m)   Wt 138 lb (62.6 kg)   SpO2 98%   BMI 25.24 kg/m  Vitals with BMI 01/19/2021 10/28/2020 02/24/2020  Height 5\' 2"  5\' 1"  5\' 1"   Weight 138 lbs 142 lbs 3 oz 162 lbs 6 oz  BMI 25.23 89.16 94.5  Systolic 038 882 800  Diastolic 68 78 76  Pulse 349 106 86     Physical Exam  Pleasant lady.  Tearful at times describing the relationship with her husband.  She does have acne on her face and a degree of hirsutism.     Assessment   1. Attention deficit hyperactivity disorder (ADHD), combined type   2. PMS (premenstrual syndrome)   3. Cold intolerance   4. PCOS (polycystic ovarian syndrome)   5. Dyslipidemia       Tests ordered Orders Placed This Encounter  Procedures  . Follicle stimulating hormone  . Luteinizing hormone  . Testos,Total,Free and SHBG (Female)  . T3, free  . T4, free  . TSH  . Lipid panel     Plan: 1. I think this lady may have PCOS and we will do all the blood work regarding this. 2. For her premenstrual syndrome, I recommended progesterone at night and she will start taking this. 3. Further recommendations will depend on all these blood results and I will discuss them at the next visit.  Today spent approximately 45 minutes with this patient discussing all her symptoms, relationship and reviewing previous medical records.   Meds ordered this encounter  Medications  . progesterone (PROMETRIUM) 200 MG capsule    Sig: Take 1 capsule (200 mg total) by mouth daily.    Dispense:  30 capsule    Refill:  3    Allani Reber Luther Parody, MD

## 2021-01-21 ENCOUNTER — Other Ambulatory Visit: Payer: Self-pay

## 2021-01-21 ENCOUNTER — Encounter: Payer: Self-pay | Admitting: Adult Health

## 2021-01-21 ENCOUNTER — Ambulatory Visit (INDEPENDENT_AMBULATORY_CARE_PROVIDER_SITE_OTHER): Payer: 59 | Admitting: Adult Health

## 2021-01-21 VITALS — BP 135/79 | HR 87 | Ht 61.0 in | Wt 138.0 lb

## 2021-01-21 DIAGNOSIS — F909 Attention-deficit hyperactivity disorder, unspecified type: Secondary | ICD-10-CM | POA: Diagnosis not present

## 2021-01-21 DIAGNOSIS — F411 Generalized anxiety disorder: Secondary | ICD-10-CM | POA: Diagnosis not present

## 2021-01-21 DIAGNOSIS — F331 Major depressive disorder, recurrent, moderate: Secondary | ICD-10-CM | POA: Diagnosis not present

## 2021-01-21 DIAGNOSIS — F422 Mixed obsessional thoughts and acts: Secondary | ICD-10-CM | POA: Diagnosis not present

## 2021-01-21 MED ORDER — SERTRALINE HCL 100 MG PO TABS: ORAL_TABLET | ORAL | 2 refills | Status: DC

## 2021-01-21 MED ORDER — AMPHETAMINE-DEXTROAMPHET ER 20 MG PO CP24: 20.0000 mg | ORAL_CAPSULE | ORAL | 0 refills | Status: DC

## 2021-01-21 NOTE — Progress Notes (Signed)
[facility] MD/PA/NP Initial Note  01/21/2021 1:56 PM Cristina Davenport  MRN:  [id number]  Chief Complaint:   HPI:   Describes mood today as "not the best". Pleasant. Mood symptoms - reports depression, anxiety, and irritability. Denies mood fluctuations - no mania. Obsessive tendencies. Likes things clean and in order. Feels better in "clean" spaces. Worries about children. Thinks the worst about a situation. Doesn't like to be around a lot of people. Gets in "modes of shut down" from time to time. Stable interest and motivation. Taking medications as prescribed.  Energy levels lower. Active, does not have a regular exercise routine.  Enjoys some usual interests and activities. Married. Lives with husband of 2 years, together 10 years. Family 40 minutes away. Spending time with family. Appetite adequate. Weight stable - 138 pounds - 61". Sleeps well most nights. Averages 4 hours - waking up and thinking.  Focus and concentration stable. Taking Adderall XR 20mg  daily. Completing tasks. Managing aspects of household. 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  Denies AH or VH.  Previous medication trials: Zoloft, Hydroxyzine, Adderall XR 20mg  daily  Visit Diagnosis:    ICD-10-CM   1. Generalized anxiety disorder  F41.1 sertraline (ZOLOFT) 100 MG tablet  2. Major depressive disorder, recurrent episode, moderate (HCC)  F33.1 sertraline (ZOLOFT) 100 MG tablet  3. Attention deficit hyperactivity disorder (ADHD), unspecified ADHD type  F90.9 amphetamine-dextroamphetamine (ADDERALL XR) 20 MG 24 hr capsule  4. Mixed obsessional thoughts and acts  F42.2     Past Psychiatric History: Denies psychiatric hospitalization.  Past Medical History:  Past Medical History:  Diagnosis Date  . Anxiety   . Anxiety   . Bipolar 1 disorder ([place])   . GERD (gastroesophageal reflux disease)   . Hypertension    PIH  . IBS (irritable bowel syndrome)   . Migraine without aura     Past Surgical History:  Procedure  Laterality Date  . ABDOMINAL SURGERY     cyst removed   . CHOLECYSTECTOMY N/A 07/10/2013   Procedure: LAPAROSCOPIC CHOLECYSTECTOMY;  Surgeon: Donato Heinz, MD;  Location: [facility];  Service: General;  Laterality: N/A;  . ESOPHAGOGASTRODUODENOSCOPY N/A 04/30/2013   GYF:VCBSWHQP distal esophagus of uncertain clinical significance-status post biopsy. Tiny hiatal hernia. No explanation  . EXAMINATION UNDER ANESTHESIA  04/21/2012   Gluteal wound repair  . WISDOM TOOTH EXTRACTION      Family Psychiatric History: Family history of mental illness.  Family History:  Family History  Adopted: Yes  Problem Relation Age of Onset  . Mental illness Mother   . Cancer Father        skin cancer  . Hypertension Maternal Grandmother   . Stroke Maternal Grandmother   . Kidney failure Maternal Grandmother   . Hypertension Maternal Grandfather   . Stroke Maternal Grandfather   . Cancer Maternal Grandfather        skin  . Crohn's disease Cousin   . Colon cancer Neg Hx     Social History:  Social History   Socioeconomic History  . Marital status: Married    Spouse name: Bard Herbert  . Number of children: 2  . Years of education: 38  . Highest education level: Some college, no degree  Occupational History  . Occupation: [employer]  Tobacco Use  . Smoking status: Former Smoker    Packs/day: 0.00    Years: 8.00    Pack years: 0.00    Types: Cigarettes    Quit date: 09/19/2014  Years since quitting: 6.3  . Smokeless tobacco: Never Used  Vaping Use  . Vaping Use: Every day  Substance and Sexual Activity  . Alcohol use: Yes    Alcohol/week: 3.0 standard drinks    Types: 3 Glasses of wine per week  . Drug use: No  . Sexual activity: Yes    Partners: Male    Birth control/protection: I.U.D.  Other Topics Concern  . Not on file  Social History Narrative   Married since July 2020,been together for 10 years.Lives with husband and kids.   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   Social Determinants of Health   Financial Resource Strain: Not on file  Food Insecurity: Not on file  Transportation Needs: Not on file  Physical Activity: Not on file  Stress: Not on file  Social Connections: Not on file    Allergies:  Allergies  Allergen Reactions  . Latex Other (See Comments)    General irritation  . Doxycycline Rash    Solar rash    Metabolic Disorder Labs: No results found for: HGBA1C, MPG No results found for: PROLACTIN Lab Results  Component Value Date   CHOL 186 01/19/2021   TRIG 35 01/19/2021   HDL 76 01/19/2021   CHOLHDL 2.4 01/19/2021   LDLCALC 100 (H) 01/19/2021   LDLCALC 104 (H) 02/23/2018   Lab Results  Component Value Date   TSH 1.41 01/19/2021   TSH 2.190 02/23/2018    Therapeutic Level Labs: No results found for: LITHIUM No results found for: VALPROATE No components found for:  CBMZ  Current Medications: Current Outpatient Medications  Medication Sig Dispense Refill  . amphetamine-dextroamphetamine (ADDERALL XR) 20 MG 24 hr capsule Take 1 capsule (20 mg total) by mouth every morning. 30 capsule 0  . amphetamine-dextroamphetamine (ADDERALL XR) 20 MG 24 hr capsule Take 1 capsule (20 mg total) by mouth every morning. 30 capsule 0  . amphetamine-dextroamphetamine (ADDERALL XR) 20 MG 24 hr capsule Take 1 capsule (20 mg total) by mouth every morning. 30 capsule 0  . hydrOXYzine (ATARAX/VISTARIL) 25 MG tablet TAKE 1 TABLET BY MOUTH AT BEDTIME AS NEEDED. 90 tablet 1  . levonorgestrel (LILETTA, 52 MG,) 19.5 MCG/DAY IUD IUD 1 each by Intrauterine route once.    . progesterone (PROMETRIUM) 200 MG capsule Take 1 capsule (200 mg total) by mouth daily. 30 capsule 3  . sertraline (ZOLOFT) 100 MG tablet TAKE 1 & 1/2 (ONE & ONE-HALF) TABLETS BY MOUTH ONCE DAILY 45 tablet 2  . valACYclovir (VALTREX) 1000 MG tablet Take 1,000 mg by mouth as needed.     No current facility-administered medications for this visit.     Medication Side Effects: none  Orders placed this visit:  No orders of the defined types were placed in this encounter.   Psychiatric Specialty Exam:  Review of Systems  Musculoskeletal: Negative for gait problem.  Neurological: Negative for tremors.  Psychiatric/Behavioral:       Please refer to HPI    Blood pressure 135/79, pulse 87, height 5\' 1"  (1.549 m), weight 138 lb (62.6 kg), not currently breastfeeding.Body mass index is 26.07 kg/m.  General Appearance: Casual and Neat  Eye Contact:  Good  Speech:  Clear and Coherent and Normal Rate  Volume:  Normal  Mood:  Anxious, Depressed and Irritable  Affect:  Appropriate and Congruent  Thought Process:  Coherent and Descriptions of Associations: Intact  Orientation:  Full (Time, Place, and Person)  Thought Content: Logical  Suicidal Thoughts:  No  Homicidal Thoughts:  No  Memory:  WNL  Judgement:  Good  Insight:  Good  Psychomotor Activity:  Normal  Concentration:  Concentration: Good  Recall:  Good  Fund of Knowledge: Good  Language: Good  Assets:  Communication Skills Desire for Improvement Financial Resources/Insurance Housing Intimacy Leisure Time Physical Health Resilience Social Support Talents/Skills Transportation Vocational/Educational  ADL's:  Intact  Cognition: WNL  Prognosis:  Good   Screenings:  GAD-7   Flowsheet Row Office Visit from 10/28/2020 in [facility] from 06/19/2020 in [facility] from 03/06/2020 in [facility] Visit from 08/17/2018 in [facility] Visit from 05/21/2018 in [facility]  Total GAD-7 Score 19 17 21 11 20     PHQ2-9   [place] Visit from 01/19/2021 in [facility] Office Visit from 10/28/2020 in [facility] from 06/19/2020 in [facility] from 03/06/2020 in [facility] Initial  Prenatal from 05/10/2019 in [facility]  PHQ-2 Total Score 6 1 1  0 0  PHQ-9 Total Score 18 7 8 15 4       Receiving Psychotherapy: No   Treatment Plan/Recommendations:   Plan:  PDMP reviewed  1. Adderall XR 20mg  daily 2. Increase Zoloft 150mg  to 200mg  daily  Read and reviewed note with patient for accuracy.   RTC 4 weeks  Patient advised to contact office with any questions, adverse effects, or acute worsening in signs and symptoms.  Discussed potential benefits, risks, and side effects of stimulants with patient to include increased heart rate, palpitations, insomnia, increased anxiety, increased irritability, or decreased appetite.  Instructed patient to contact office if experiencing any significant tolerability issues.   Aloha Gell, NP

## 2021-01-21 NOTE — Addendum Note (Signed)
Addended by: Aloha Gell on: 01/21/2021 01:58 PM   Modules accepted: Orders

## 2021-01-22 LAB — LIPID PANEL
Cholesterol: 186 mg/dL (ref <200)
HDL: 76 mg/dL (ref >=50)
LDL Cholesterol (Calc): 100 mg/dL (calc) — ABNORMAL HIGH
Non-HDL Cholesterol (Calc): 110 mg/dL (calc) (ref <130)
Total CHOL/HDL Ratio: 2.4 (calc) (ref <5.0)
Triglycerides: 35 mg/dL (ref <150)

## 2021-01-22 LAB — TESTOS,TOTAL,FREE AND SHBG (FEMALE)
Free Testosterone: 3.8 pg/mL (ref 0.1–6.4)
Sex Hormone Binding: 96 nmol/L (ref 17–124)
Testosterone, Total, LC-MS-MS: 52 ng/dL — ABNORMAL HIGH (ref 2–45)

## 2021-01-22 LAB — T3, FREE: T3, Free: 3.6 pg/mL (ref 2.3–4.2)

## 2021-01-22 LAB — FOLLICLE STIMULATING HORMONE: FSH: 4.6 m[IU]/mL

## 2021-01-22 LAB — LUTEINIZING HORMONE: LH: 10.3 m[IU]/mL

## 2021-01-22 LAB — T4, FREE: Free T4: 1.3 ng/dL (ref 0.8–1.8)

## 2021-01-22 LAB — TSH: TSH: 1.41 mIU/L

## 2021-01-26 ENCOUNTER — Ambulatory Visit: Payer: 59 | Admitting: Family Medicine

## 2021-01-26 ENCOUNTER — Other Ambulatory Visit: Payer: Self-pay | Admitting: Adult Health

## 2021-01-26 ENCOUNTER — Telehealth: Payer: Self-pay | Admitting: Adult Health

## 2021-01-26 DIAGNOSIS — F909 Attention-deficit hyperactivity disorder, unspecified type: Secondary | ICD-10-CM

## 2021-01-26 MED ORDER — AMPHETAMINE-DEXTROAMPHETAMINE 10 MG PO TABS: 10.0000 mg | ORAL_TABLET | Freq: Every day | ORAL | 0 refills | Status: DC

## 2021-01-26 NOTE — Telephone Encounter (Signed)
Pt called to report she needs Rx for Adderall 10 mg NOT XR to take around 3 pm to get through the afternoon @ [facility] in [place]. She just picked up Adderall 20 mg XR 2/23.

## 2021-01-26 NOTE — Telephone Encounter (Signed)
Script sent  

## 2021-02-04 ENCOUNTER — Other Ambulatory Visit: Payer: Self-pay

## 2021-02-04 ENCOUNTER — Ambulatory Visit (INDEPENDENT_AMBULATORY_CARE_PROVIDER_SITE_OTHER): Payer: 59 | Admitting: Nurse Practitioner

## 2021-02-04 VITALS — BP 126/70 | HR 90 | Temp 98.0°F | Ht 61.0 in | Wt 137.0 lb

## 2021-02-04 DIAGNOSIS — R300 Dysuria: Secondary | ICD-10-CM

## 2021-02-04 LAB — POCT URINE PREGNANCY: Preg Test, Ur: NEGATIVE

## 2021-02-04 NOTE — Progress Notes (Signed)
Subjective:  Patient ID: Cristina Davenport, female    DOB: 03-08-1986  Age: 35 y.o. MRN: [id number]  CC:  Chief Complaint  Patient presents with  . Dysuria      HPI  This patient arrives today for the above.  Approximately 1 week ago she started experiencing some mild dysuria and pelvic discomfort/bloating.  She denies seeing any blood in her urine, but does report frequency and urgency.  She is concerned she might have a urinary tract infection.  Past Medical History:  Diagnosis Date  . Anxiety   . Anxiety   . Bipolar 1 disorder ([place])   . GERD (gastroesophageal reflux disease)   . Hypertension    PIH  . IBS (irritable bowel syndrome)   . Migraine without aura       Family History  Adopted: Yes  Problem Relation Age of Onset  . Mental illness Mother   . Cancer Father        skin cancer  . Hypertension Maternal Grandmother   . Stroke Maternal Grandmother   . Kidney failure Maternal Grandmother   . Hypertension Maternal Grandfather   . Stroke Maternal Grandfather   . Cancer Maternal Grandfather        skin  . Crohn's disease Cousin   . Colon cancer Neg Hx     Social History   Social History Narrative   Married since July 2020,been together for 10 years.Lives with husband and kids.   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   Social History   Tobacco Use  . Smoking status: Former Smoker    Packs/day: 0.00    Years: 8.00    Pack years: 0.00    Types: Cigarettes    Quit date: 09/19/2014    Years since quitting: 6.3  . Smokeless tobacco: Never Used  Substance Use Topics  . Alcohol use: Yes    Alcohol/week: 3.0 standard drinks    Types: 3 Glasses of wine per week     Current Meds  Medication Sig  . amphetamine-dextroamphetamine (ADDERALL XR) 20 MG 24 hr capsule Take 1 capsule (20 mg total) by mouth every morning.  Marland Kitchen amphetamine-dextroamphetamine (ADDERALL) 10 MG tablet Take 1 tablet (10 mg total) by mouth daily with  breakfast.  . levonorgestrel (LILETTA, 52 MG,) 19.5 MCG/DAY IUD IUD 1 each by Intrauterine route once.  . progesterone (PROMETRIUM) 200 MG capsule Take 1 capsule (200 mg total) by mouth daily.  . sertraline (ZOLOFT) 100 MG tablet Take two tablets daily.  . valACYclovir (VALTREX) 1000 MG tablet Take 1,000 mg by mouth as needed.    ROS:  Review of Systems  Constitutional: Positive for malaise/fatigue. Negative for fever.  Gastrointestinal: Positive for diarrhea and nausea. Negative for vomiting.       (+) bloated  Genitourinary: Positive for dysuria, flank pain, frequency and hematuria.     Objective:   Today's Vitals: BP 126/70   Pulse 90   Temp 98 F (36.7 C)   Ht 5\' 1"  (1.549 m)   Wt 137 lb (62.1 kg)   LMP 01/20/2021   SpO2 99%   BMI 25.89 kg/m  Vitals with BMI 02/04/2021 01/19/2021 10/28/2020  Height 5\' 1"  5\' 2"  5\' 1"   Weight 137 lbs 138 lbs 142 lbs 3 oz  BMI 25.9 53.97 67.34  Systolic 193 790 240  Diastolic 70 68 78  Pulse 90 105 106  Some encounter information is confidential and restricted. Go to Review Flowsheets  activity to see all data.     Physical Exam Vitals reviewed.  Constitutional:      General: She is not in acute distress.    Appearance: Normal appearance.  HENT:     Head: Normocephalic and atraumatic.  Neck:     Vascular: No carotid bruit.  Cardiovascular:     Rate and Rhythm: Normal rate and regular rhythm.     Pulses: Normal pulses.     Heart sounds: Normal heart sounds.  Pulmonary:     Effort: Pulmonary effort is normal.     Breath sounds: Normal breath sounds.  Abdominal:     Tenderness: There is left CVA tenderness. There is no right CVA tenderness.  Skin:    General: Skin is warm and dry.  Neurological:     General: No focal deficit present.     Mental Status: She is alert and oriented to person, place, and time.  Psychiatric:        Mood and Affect: Mood normal.        Behavior: Behavior normal.        Judgment: Judgment normal.           Assessment and Plan   1. Dysuria      Plan: 1.  We will send urine out for urinalysis with reflex to culture.  Further recommendations will be made based upon these results.   Tests ordered Orders Placed This Encounter  Procedures  . Urinalysis with Culture Reflex  . POCT urine pregnancy      No orders of the defined types were placed in this encounter.   Patient to follow-up as scheduled next month or sooner as needed  Ailene Ards, NP

## 2021-02-05 ENCOUNTER — Other Ambulatory Visit (INDEPENDENT_AMBULATORY_CARE_PROVIDER_SITE_OTHER): Payer: Self-pay | Admitting: Nurse Practitioner

## 2021-02-05 DIAGNOSIS — N3001 Acute cystitis with hematuria: Secondary | ICD-10-CM

## 2021-02-05 MED ORDER — NITROFURANTOIN MONOHYD MACRO 100 MG PO CAPS: 100.0000 mg | ORAL_CAPSULE | Freq: Two times a day (BID) | ORAL | 0 refills | Status: DC

## 2021-02-08 LAB — URINALYSIS W MICROSCOPIC + REFLEX CULTURE
Bilirubin Urine: NEGATIVE
Glucose, UA: NEGATIVE
Ketones, ur: NEGATIVE
Nitrites, Initial: POSITIVE — AB
Specific Gravity, Urine: 1.018 (ref 1.001–1.03)
Squamous Epithelial / HPF: NONE SEEN /HPF (ref <=5)
pH: 6.5 (ref 5.0–8.0)

## 2021-02-08 LAB — URINE CULTURE

## 2021-02-08 LAB — CULTURE INDICATED

## 2021-02-12 ENCOUNTER — Other Ambulatory Visit: Payer: Self-pay | Admitting: Adult Health

## 2021-02-12 DIAGNOSIS — F411 Generalized anxiety disorder: Secondary | ICD-10-CM

## 2021-02-12 DIAGNOSIS — F331 Major depressive disorder, recurrent, moderate: Secondary | ICD-10-CM

## 2021-02-19 ENCOUNTER — Encounter: Payer: Self-pay | Admitting: Adult Health

## 2021-02-19 ENCOUNTER — Ambulatory Visit (INDEPENDENT_AMBULATORY_CARE_PROVIDER_SITE_OTHER): Payer: 59 | Admitting: Adult Health

## 2021-02-19 ENCOUNTER — Other Ambulatory Visit: Payer: Self-pay

## 2021-02-19 DIAGNOSIS — F411 Generalized anxiety disorder: Secondary | ICD-10-CM | POA: Diagnosis not present

## 2021-02-19 DIAGNOSIS — F909 Attention-deficit hyperactivity disorder, unspecified type: Secondary | ICD-10-CM | POA: Diagnosis not present

## 2021-02-19 DIAGNOSIS — F331 Major depressive disorder, recurrent, moderate: Secondary | ICD-10-CM | POA: Diagnosis not present

## 2021-02-19 DIAGNOSIS — F422 Mixed obsessional thoughts and acts: Secondary | ICD-10-CM | POA: Diagnosis not present

## 2021-02-19 MED ORDER — AMPHETAMINE-DEXTROAMPHET ER 20 MG PO CP24: 20.0000 mg | ORAL_CAPSULE | ORAL | 0 refills | Status: DC

## 2021-02-19 MED ORDER — AMPHETAMINE-DEXTROAMPHETAMINE 10 MG PO TABS: 10.0000 mg | ORAL_TABLET | Freq: Every day | ORAL | 0 refills | Status: DC

## 2021-02-19 MED ORDER — AMPHETAMINE-DEXTROAMPHET ER 20 MG PO CP24: 20.0000 mg | ORAL_CAPSULE | Freq: Every day | ORAL | 0 refills | Status: DC

## 2021-02-19 MED ORDER — SERTRALINE HCL 100 MG PO TABS: ORAL_TABLET | ORAL | 2 refills | Status: DC

## 2021-02-19 NOTE — Progress Notes (Addendum)
Cristina Davenport [id number] [birth year] 35 y.o.  Subjective:   Patient ID:  Cristina Davenport is a 35 y.o. (DOB 09-14-1986) female.  Chief Complaint: No chief complaint on file.   HPI Cristina Davenport presents to the office today for follow-up of ADHD, MDD, GAD, and obsessional thoughts and acts.   Describes mood today as "better". Pleasant. Mood symptoms - reports depression, anxiety, and irritability - lacking sleep. Stating "I really feel about the same". Does not see mood improving with current living situation. Mood remains stable.  Obsessive tendencies - "have gotten better". Able to get more done in the evening and not waking up overwhelmed. Has purchased some notebooks to help maintain organization. Decreased worry over children - "depends on the situation".   Stable interest and motivation. Taking medications as prescribed.  Energy levels better. Active, does not have a regular exercise routine.  Enjoys some usual interests and activities. Married. Lives with husband of 2 years, together 10 years. Has 3 children - 12, 3, and 1. Family 40 minutes away. Spending time with family. Appetite adequate. Weight stable - 128.4  pounds - 61". Sleeps well most nights. Averages 5 to 7 hours - up and down with children  Focus and concentration improved with addition of Adderall 10mg  in the afternoon. Completing tasks. Managing aspects of household. 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  Denies AH or VH.  Previous medication trials: Zoloft, Hydroxyzine, Adderall XR 20mg  daily   GAD-7   Flowsheet Row Office Visit from 10/28/2020 in [place] from 06/19/2020 in [place] from 03/06/2020 in [place] Visit from 08/17/2018 in [facility] Visit from 05/21/2018 in [place] [facility]  Total GAD-7 Score 19 17 21 11 20     PHQ2-9   [place] Office Visit from 01/19/2021 in [place] Visit from 10/28/2020 in [place] from 06/19/2020 in [place] from 03/06/2020 in [place] Initial Prenatal from 05/10/2019 in [facility]  PHQ-2 Total Score 6 1 1  0 0  PHQ-9 Total Score 18 7 8 15 4        Review of Systems:  Review of Systems  Musculoskeletal: Negative for gait problem.  Neurological: Negative for tremors.  Psychiatric/Behavioral:       Please refer to HPI    Medications: I have reviewed the patient's current medications.  Current Outpatient Medications  Medication Sig Dispense Refill  . [START ON 03/19/2021] amphetamine-dextroamphetamine (ADDERALL XR) 20 MG 24 hr capsule Take 1 capsule (20 mg total) by mouth daily. 30 capsule 0  . [START ON 04/16/2021] amphetamine-dextroamphetamine (ADDERALL XR) 20 MG 24 hr capsule Take 1 capsule (20 mg total) by mouth daily. 30 capsule 0  . [START ON 03/19/2021] amphetamine-dextroamphetamine (ADDERALL) 10 MG tablet Take 1 tablet (10 mg total) by mouth daily. 30 tablet 0  . [START ON 04/16/2021] amphetamine-dextroamphetamine (ADDERALL) 10 MG tablet Take 1 tablet (10 mg total) by mouth daily. 30 tablet 0  . amphetamine-dextroamphetamine (ADDERALL XR) 20 MG 24 hr capsule Take 1 capsule (20 mg total) by mouth every morning. 30 capsule 0  . amphetamine-dextroamphetamine (ADDERALL) 10 MG tablet Take 1 tablet (10 mg total) by mouth daily. 30 tablet 0  . hydrOXYzine (ATARAX/VISTARIL) 25 MG tablet TAKE 1 TABLET BY MOUTH AT BEDTIME AS NEEDED. (Patient not taking: Reported on 02/04/2021) 90 tablet 1  . levonorgestrel (LILETTA, 52 MG,) 19.5 MCG/DAY IUD IUD 1 each  by Intrauterine route once.    . nitrofurantoin, macrocrystal-monohydrate, (MACROBID) 100 MG capsule Take 1 capsule (100 mg total) by mouth 2 (two) times daily. 14 capsule 0  . progesterone (PROMETRIUM) 200 MG capsule Take 1 capsule (200 mg total) by mouth daily. 30 capsule 3  . sertraline (ZOLOFT) 100 MG  tablet Take two tablets daily. 60 tablet 2  . valACYclovir (VALTREX) 1000 MG tablet Take 1,000 mg by mouth as needed.     No current facility-administered medications for this visit.    Medication Side Effects: None  Allergies:  Allergies  Allergen Reactions  . Latex Other (See Comments)    General irritation  . Doxycycline Rash    Solar rash    Past Medical History:  Diagnosis Date  . Anxiety   . Anxiety   . Bipolar 1 disorder ([place])   . GERD (gastroesophageal reflux disease)   . Hypertension    PIH  . IBS (irritable bowel syndrome)   . Migraine without aura     Family History  Adopted: Yes  Problem Relation Age of Onset  . Mental illness Mother   . Cancer Father        skin cancer  . Hypertension Maternal Grandmother   . Stroke Maternal Grandmother   . Kidney failure Maternal Grandmother   . Hypertension Maternal Grandfather   . Stroke Maternal Grandfather   . Cancer Maternal Grandfather        skin  . Crohn's disease Cousin   . Colon cancer Neg Hx     Social History   Socioeconomic History  . Marital status: Married    Spouse name: Bard Herbert  . Number of children: 2  . Years of education: 70  . Highest education level: Some college, no degree  Occupational History  . Occupation: [employer]  Tobacco Use  . Smoking status: Former Smoker    Packs/day: 0.00    Years: 8.00    Pack years: 0.00    Types: Cigarettes    Quit date: 09/19/2014    Years since quitting: 6.4  . Smokeless tobacco: Never Used  Vaping Use  . Vaping Use: Every day  Substance and Sexual Activity  . Alcohol use: Yes    Alcohol/week: 3.0 standard drinks    Types: 3 Glasses of wine per week  . Drug use: No  . Sexual activity: Yes    Partners: Male    Birth control/protection: I.U.D.  Other Topics Concern  . Not on file  Social History Narrative   Married since July 2020,been together for 10 years.Lives with husband and kids.   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   Social Determinants of Health   Financial Resource Strain: Not on file  Food Insecurity: Not on file  Transportation Needs: Not on file  Physical Activity: Not on file  Stress: Not on file  Social Connections: Not on file  Intimate Partner Violence: Not on file    Past Medical History, Surgical history, Social history, and Family history were reviewed and updated as appropriate.   Please see review of systems for further details on the patient's review from today.   Objective:   Physical Exam:  LMP 01/20/2021   Physical Exam Constitutional:      General: She is not in acute distress. Musculoskeletal:        General: No deformity.  Neurological:     Mental Status: She is alert and oriented to person, place, and time.  Coordination: Coordination normal.  Psychiatric:        Attention and Perception: Attention and perception normal. She does not perceive auditory or visual hallucinations.        Mood and Affect: Mood normal. Mood is not anxious or depressed. Affect is not labile, blunt, angry or inappropriate.        Speech: Speech normal.        Behavior: Behavior normal.        Thought Content: Thought content normal. Thought content is not paranoid or delusional. Thought content does not include homicidal or suicidal ideation. Thought content does not include homicidal or suicidal plan.        Cognition and Memory: Cognition and memory normal.        Judgment: Judgment normal.     Comments: Insight intact     Lab Review:     Component Value Date/Time   NA 136 10/15/2019 0909   NA 137 05/10/2019 1138   K 3.8 10/15/2019 0909   CL 105 10/15/2019 0909   CO2 15 (L) 10/15/2019 0909   GLUCOSE 74 10/15/2019 0909   BUN <5 (L) 10/15/2019 0909   BUN 8 05/10/2019 1138   CREATININE 0.53 10/15/2019 0909   CALCIUM 9.0 10/15/2019 0909   PROT 6.0 (L) 10/15/2019 0909   PROT 6.9 05/10/2019 1138   ALBUMIN 2.9 (L) 10/15/2019 0909   ALBUMIN 4.6  05/10/2019 1138   AST 19 10/15/2019 0909   ALT 19 10/15/2019 0909   ALKPHOS 97 10/15/2019 0909   BILITOT 0.6 10/15/2019 0909   BILITOT 0.3 05/10/2019 1138   GFRNONAA >60 10/15/2019 0909   GFRAA >60 10/15/2019 0909       Component Value Date/Time   WBC 10.6 (H) 11/07/2019 0412   RBC 4.01 11/07/2019 0412   HGB 11.9 (L) 11/07/2019 0412   HGB 12.4 08/28/2019 0919   HGB 13.3 09/23/2015 1638   HCT 36.1 11/07/2019 0412   HCT 37.8 08/28/2019 0919   PLT 184 11/07/2019 0412   PLT 226 08/28/2019 0919   MCV 90.0 11/07/2019 0412   MCV 89 08/28/2019 0919   MCH 29.7 11/07/2019 0412   MCHC 33.0 11/07/2019 0412   RDW 13.1 11/07/2019 0412   RDW 12.4 08/28/2019 0919   LYMPHSABS 2.0 05/10/2019 1445   MONOABS 0.8 07/19/2013 1545   EOSABS 0.1 05/10/2019 1445   BASOSABS 0.0 05/10/2019 1445    No results found for: POCLITH, LITHIUM   No results found for: PHENYTOIN, PHENOBARB, VALPROATE, CBMZ   .res Assessment: Plan:     Plan:  PDMP reviewed  1. Adderall XR 20mg  daily 2. Zoloft 150mg  to 200mg  daily 3. Adderall 10mg  daily  116/76 89   Read and reviewed note with patient for accuracy.   RTC 4 weeks  Patient advised to contact office with any questions, adverse effects, or acute worsening in signs and symptoms.  Discussed potential benefits, risks, and side effects of stimulants with patient to include increased heart rate, palpitations, insomnia, increased anxiety, increased irritability, or decreased appetite.  Instructed patient to contact office if experiencing any significant tolerability issues.   Diagnoses and all orders for this visit:  Mixed obsessional thoughts and acts  Major depressive disorder, recurrent episode, moderate (HCC) -     sertraline (ZOLOFT) 100 MG tablet; Take two tablets daily.  Attention deficit hyperactivity disorder (ADHD), unspecified ADHD type -     amphetamine-dextroamphetamine (ADDERALL) 10 MG tablet; Take 1 tablet (10 mg total) by mouth  daily. -  amphetamine-dextroamphetamine (ADDERALL XR) 20 MG 24 hr capsule; Take 1 capsule (20 mg total) by mouth every morning. -     amphetamine-dextroamphetamine (ADDERALL) 10 MG tablet; Take 1 tablet (10 mg total) by mouth daily. -     amphetamine-dextroamphetamine (ADDERALL) 10 MG tablet; Take 1 tablet (10 mg total) by mouth daily. -     amphetamine-dextroamphetamine (ADDERALL XR) 20 MG 24 hr capsule; Take 1 capsule (20 mg total) by mouth daily. -     amphetamine-dextroamphetamine (ADDERALL XR) 20 MG 24 hr capsule; Take 1 capsule (20 mg total) by mouth daily.  Generalized anxiety disorder -     sertraline (ZOLOFT) 100 MG tablet; Take two tablets daily.     Please see After Visit Summary for patient specific instructions.  Future Appointments  Date Time Provider [place]  03/08/2021 10:45 AM Doree Albee, MD [place] None  05/28/2021  9:40 AM Elenna Spratling, Berdie Ogren, NP [facility] None    No orders of the defined types were placed in this encounter.   -------------------------------

## 2021-03-08 ENCOUNTER — Ambulatory Visit (INDEPENDENT_AMBULATORY_CARE_PROVIDER_SITE_OTHER): Payer: 59 | Admitting: Internal Medicine

## 2021-03-10 ENCOUNTER — Encounter (INDEPENDENT_AMBULATORY_CARE_PROVIDER_SITE_OTHER): Payer: Self-pay | Admitting: Internal Medicine

## 2021-03-10 ENCOUNTER — Other Ambulatory Visit: Payer: Self-pay

## 2021-03-10 ENCOUNTER — Ambulatory Visit (INDEPENDENT_AMBULATORY_CARE_PROVIDER_SITE_OTHER): Payer: 59 | Admitting: Internal Medicine

## 2021-03-10 ENCOUNTER — Other Ambulatory Visit (INDEPENDENT_AMBULATORY_CARE_PROVIDER_SITE_OTHER): Payer: Self-pay | Admitting: Internal Medicine

## 2021-03-10 VITALS — BP 110/70 | HR 95 | Temp 96.6°F | Ht 61.0 in | Wt 135.6 lb

## 2021-03-10 DIAGNOSIS — N3001 Acute cystitis with hematuria: Secondary | ICD-10-CM

## 2021-03-10 DIAGNOSIS — E282 Polycystic ovarian syndrome: Secondary | ICD-10-CM

## 2021-03-10 DIAGNOSIS — R6889 Other general symptoms and signs: Secondary | ICD-10-CM | POA: Diagnosis not present

## 2021-03-10 DIAGNOSIS — N943 Premenstrual tension syndrome: Secondary | ICD-10-CM | POA: Diagnosis not present

## 2021-03-10 MED ORDER — NP THYROID 60 MG PO TABS: 60.0000 mg | ORAL_TABLET | Freq: Every day | ORAL | 3 refills | Status: DC

## 2021-03-10 NOTE — Progress Notes (Signed)
Metrics: Intervention Frequency ACO  Documented Smoking Status Yearly  Screened one or more times in 24 months  Cessation Counseling or  Active cessation medication Past 24 months  Past 24 months   Guideline developer: UpToDate (See UpToDate for funding source) Date Released: 2014       Wellness Office Visit  Subjective:  Patient ID: Cristina Davenport, female    DOB: 1986/01/18  Age: 35 y.o. MRN: [id number]  CC: This lady comes in for follow-up of blood work that I done on the last occasion and further recommendations. HPI  I discussed all the blood work with her which shows diagnosis of PCOS with appropriate "flipped" FSH/LH ratio.  She also has elevated testosterone levels which is consistent with a diagnosis of PCOS.  She also has symptoms relating to this. She also has symptoms relating to thyroid hypofunction and free T3 levels are suboptimal. She tells me she has recovered from the UTI that she had previously. I had started on progesterone for PMS symptoms and she says that this is helped her but sometimes she forgets to take it. Past Medical History:  Diagnosis Date  . Anxiety   . Anxiety   . Bipolar 1 disorder ([place])   . GERD (gastroesophageal reflux disease)   . Hypertension    PIH  . IBS (irritable bowel syndrome)   . Migraine without aura    Past Surgical History:  Procedure Laterality Date  . ABDOMINAL SURGERY     cyst removed   . CHOLECYSTECTOMY N/A 07/10/2013   Procedure: LAPAROSCOPIC CHOLECYSTECTOMY;  Surgeon: Donato Heinz, MD;  Location: [facility];  Service: General;  Laterality: N/A;  . ESOPHAGOGASTRODUODENOSCOPY N/A 04/30/2013   [id number] distal esophagus of uncertain clinical significance-status post biopsy. Tiny hiatal hernia. No explanation  . EXAMINATION UNDER ANESTHESIA  04/21/2012   Gluteal wound repair  . WISDOM TOOTH EXTRACTION       Family History  Adopted: Yes  Problem Relation Age of Onset  . Mental illness Mother   . Cancer Father         skin cancer  . Hypertension Maternal Grandmother   . Stroke Maternal Grandmother   . Kidney failure Maternal Grandmother   . Hypertension Maternal Grandfather   . Stroke Maternal Grandfather   . Cancer Maternal Grandfather        skin  . Crohn's disease Cousin   . Colon cancer Neg Hx     Social History   Social History Narrative   Married since July 2020,been together for 10 years.Lives with husband and kids.   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   Social History   Tobacco Use  . Smoking status: Former Smoker    Packs/day: 0.00    Years: 8.00    Pack years: 0.00    Types: Cigarettes    Quit date: 09/19/2014    Years since quitting: 6.4  . Smokeless tobacco: Never Used  Substance Use Topics  . Alcohol use: Yes    Alcohol/week: 3.0 standard drinks    Types: 3 Glasses of wine per week    Current Meds  Medication Sig  . amphetamine-dextroamphetamine (ADDERALL XR) 20 MG 24 hr capsule Take 1 capsule (20 mg total) by mouth every morning.  Derrill Memo ON 03/19/2021] amphetamine-dextroamphetamine (ADDERALL XR) 20 MG 24 hr capsule Take 1 capsule (20 mg total) by mouth daily.  Derrill Memo ON 04/16/2021] amphetamine-dextroamphetamine (ADDERALL XR) 20 MG 24 hr capsule Take 1 capsule (20 mg total)  by mouth daily.  Marland Kitchen amphetamine-dextroamphetamine (ADDERALL) 10 MG tablet Take 1 tablet (10 mg total) by mouth daily.  Derrill Memo ON 03/19/2021] amphetamine-dextroamphetamine (ADDERALL) 10 MG tablet Take 1 tablet (10 mg total) by mouth daily.  Derrill Memo ON 04/16/2021] amphetamine-dextroamphetamine (ADDERALL) 10 MG tablet Take 1 tablet (10 mg total) by mouth daily.  . Cholecalciferol (VITAMIN D3) 50 MCG (2000 UT) TABS Take 2,000 mg by mouth daily.  . Cyanocobalamin (B12 FAST DISSOLVE) 5000 MCG TBDP Take 1 tablet by mouth daily.  . Ferrous Sulfate (IRON) 325 (65 Fe) MG TABS Take 1 tablet by mouth daily.  Marland Kitchen levonorgestrel (LILETTA, 52 MG,) 19.5 MCG/DAY IUD IUD 1 each by Intrauterine  route once.  Marland Kitchen Lysine 1000 MG TABS Take 1,000 mg by mouth daily.  . NP THYROID 60 MG tablet Take 1 tablet (60 mg total) by mouth daily before breakfast.  . progesterone (PROMETRIUM) 200 MG capsule Take 1 capsule (200 mg total) by mouth daily.  . sertraline (ZOLOFT) 100 MG tablet Take two tablets daily.  Marland Kitchen UNABLE TO FIND Med Name: Bio Cleanse takes 2 capsules every night     [place] Office Visit from 01/19/2021 in [facility]  PHQ-9 Total Score 18      Objective:   Today's Vitals: BP 110/70   Pulse 95   Temp (!) 96.6 F (35.9 C) (Temporal)   Ht 5\' 1"  (1.549 m)   Wt 135 lb 9.6 oz (61.5 kg)   SpO2 99%   BMI 25.62 kg/m  Vitals with BMI 03/10/2021 02/04/2021 01/21/2021  Height 5\' 1"  5\' 1"  5\' 1"   Weight 135 lbs 10 oz 137 lbs 138 lbs  BMI 25.63 71.2 45.80  Systolic 998 338 250  Diastolic 70 70 79  Pulse 95 90 87     Physical Exam  She looks systemically well.  No new physical findings.     Assessment   1. PCOS (polycystic ovarian syndrome)   2. PMS (premenstrual syndrome)   3. Cold intolerance   4. Acute cystitis with hematuria       Tests ordered No orders of the defined types were placed in this encounter.    Plan: 1. We discussed the pathophysiology of PCOS and the importance and reduction of insulin resistance.  We discussed nutrition and the concept of intermittent fasting combined with a plant-based diet.  I have given her diet sheet. 2. She also has symptoms of thyroid hypofunction and after shared decision-making, I am going to start her on NP thyroid, off label for her symptoms. 3. I recommend that she continue to take progesterone every night now as I think this will help her. 4. I will see her in about 6 weeks time for follow-up.   Meds ordered this encounter  Medications  . NP THYROID 60 MG tablet    Sig: Take 1 tablet (60 mg total) by mouth daily before breakfast.    Dispense:  30 tablet    Refill:  3    Zeina Akkerman Luther Parody, MD

## 2021-03-10 NOTE — Patient Instructions (Signed)
[facility] Dietary Recommendations for Weight Loss What to Avoid . Avoid added sugars o Often added sugar can be found in processed foods such as many condiments, dry cereals, cakes, cookies, chips, crisps, crackers, candies, sweetened drinks, etc.  o Read labels and AVOID/DECREASE use of foods with the following in their ingredient list: Sugar, fructose, high fructose corn syrup, sucrose, glucose, maltose, dextrose, molasses, cane sugar, brown sugar, any type of syrup, agave nectar, etc.   . Avoid snacking in between meals . Avoid foods made with flour o If you are going to eat food made with flour, choose those made with whole-grains; and, minimize your consumption as much as is tolerable . Avoid processed foods o These foods are generally stocked in the middle of the grocery store. Focus on shopping on the perimeter of the grocery.  . Avoid Meat  o We recommend following a plant-based diet at [facility]. Thus, we recommend avoiding meat as a general rule. Consider eating beans, legumes, eggs, and/or dairy products for regular protein sources o If you plan on eating meat limit to 4 ounces of meat at a time and choose lean options such as Fish, chicken, turkey. Avoid red meat intake such as pork and/or steak What to Include . Vegetables o GREEN LEAFY VEGETABLES: Kale, spinach, mustard greens, collard greens, cabbage, broccoli, etc. o OTHER: Asparagus, cauliflower, eggplant, carrots, peas, Brussel sprouts, tomatoes, bell peppers, zucchini, beets, cucumbers, etc. . Grains, seeds, and legumes o Beans: kidney beans, black eyed peas, garbanzo beans, black beans, pinto beans, etc. o Whole, unrefined grains: brown rice, barley, bulgur, oatmeal, etc. . Healthy fats  o Avoid highly processed fats such as vegetable oil o Examples of healthy fats: avocado, olives, virgin olive oil, dark chocolate (?72% Cocoa), nuts (peanuts, almonds, walnuts, cashews, pecans, etc.) . None to Low  Intake of Animal Sources of Protein o Meat sources: chicken, turkey, salmon, tuna. Limit to 4 ounces of meat at one time. o Consider limiting dairy sources, but when choosing dairy focus on: PLAIN Greek yogurt, cottage cheese, high-protein milk . Fruit o Choose berries  When to Eat . Intermittent Fasting: o Choosing not to eat for a specific time period, but DO FOCUS ON HYDRATION when fasting o Multiple Techniques: - Time Restricted Eating: eat 3 meals in a day, each meal lasting no more than 60 minutes, no snacks between meals - 16-18 hour fast: fast for 16 to 18 hours up to 7 days a week. Often suggested to start with 2-3 nonconsecutive days per week.  . Remember the time you sleep is counted as fasting.  . Examples of eating schedule: Fast from 7:00pm-11:00am. Eat between 11:00am-7:00pm.  - 24-hour fast: fast for 24 hours up to every other day. Often suggested to start with 1 day per week . Remember the time you sleep is counted as fasting . Examples of eating schedule:  o Eating day: eat 2-3 meals on your eating day. If doing 2 meals, each meal should last no more than 90 minutes. If doing 3 meals, each meal should last no more than 60 minutes. Finish last meal by 7:00pm. o Fasting day: Fast until 7:00pm.  o IF YOU FEEL UNWELL FOR ANY REASON/IN ANY WAY WHEN FASTING, STOP FASTING BY EATING A NUTRITIOUS SNACK OR LIGHT MEAL o ALWAYS FOCUS ON HYDRATION DURING FASTS - Acceptable Hydration sources: water, broths, tea/coffee (black tea/coffee is best but using a small amount of whole-fat dairy products in coffee/tea is acceptable).  -   Poor Hydration Sources: anything with sugar or artificial sweeteners added to it  These recommendations have been developed for patients that are actively receiving medical care from either Dr. Yvette Roark or Sarah Gray, DNP, NP-C at [facility]. These recommendations are developed for patients with specific medical conditions and are not meant to be  distributed or used by others that are not actively receiving care from either provider listed above at [facility]. It is not appropriate to participate in the above eating plans without proper medical supervision.   Reference: Fung, J. The obesity code. [place]: [place]; 2016.   

## 2021-03-11 LAB — URINALYSIS W MICROSCOPIC + REFLEX CULTURE
Bacteria, UA: NONE SEEN /HPF
Bilirubin Urine: NEGATIVE
Glucose, UA: NEGATIVE
Hgb urine dipstick: NEGATIVE
Hyaline Cast: NONE SEEN /LPF
Ketones, ur: NEGATIVE
Leukocyte Esterase: NEGATIVE
Nitrites, Initial: NEGATIVE
Protein, ur: NEGATIVE
RBC / HPF: NONE SEEN /HPF (ref 0–2)
Specific Gravity, Urine: 1.008 (ref 1.001–1.035)
WBC, UA: NONE SEEN /HPF (ref 0–5)
pH: 7.5 (ref 5.0–8.0)

## 2021-03-11 LAB — NO CULTURE INDICATED

## 2021-03-24 ENCOUNTER — Telehealth (INDEPENDENT_AMBULATORY_CARE_PROVIDER_SITE_OTHER): Payer: Self-pay

## 2021-03-24 NOTE — Telephone Encounter (Signed)
Patient called and left a detailed voice message and wanted to know if PCOS could be causing pain in her upper stomach area and her back? She was not sure if this is a side effect of the PCOS? Patient also stated that at times her back pain is unbearable and she has to put pressure in areas to try to get relief and has a hard time laying down. Please advise.

## 2021-03-24 NOTE — Telephone Encounter (Signed)
PCOS by itself does not cause this pain.  If the pain is bothering her, this needs to be evaluated and we will need to see her.  If she feels this is an acute problem and very severe, she may need to go to the urgent care or emergency room.

## 2021-03-24 NOTE — Telephone Encounter (Signed)
Called patient and gave her this information. Patient stated that this has been going on for years and it is getting worse, tylenol is not helping and pain is in between breasts and upper stomach area and in her upper back. Scheduled patient for 03/29/2021 at 2:30pm. Patient verbalized an understanding.

## 2021-03-25 ENCOUNTER — Ambulatory Visit (INDEPENDENT_AMBULATORY_CARE_PROVIDER_SITE_OTHER): Payer: 59 | Admitting: Internal Medicine

## 2021-03-25 ENCOUNTER — Other Ambulatory Visit: Payer: Self-pay

## 2021-03-25 ENCOUNTER — Emergency Department
Admission: EM | Admit: 2021-03-25 | Discharge: 2021-03-26 | Disposition: A | Discharge status: Home / Self Care | Payer: 59 | Attending: Emergency Medicine | Admitting: Emergency Medicine

## 2021-03-25 ENCOUNTER — Encounter (INDEPENDENT_AMBULATORY_CARE_PROVIDER_SITE_OTHER): Payer: Self-pay | Admitting: Internal Medicine

## 2021-03-25 VITALS — BP 120/71 | HR 76 | Temp 98.4°F | Resp 17 | Ht 61.0 in | Wt 131.5 lb

## 2021-03-25 DIAGNOSIS — I10 Essential (primary) hypertension: Secondary | ICD-10-CM | POA: Diagnosis not present

## 2021-03-25 DIAGNOSIS — R1030 Lower abdominal pain, unspecified: Secondary | ICD-10-CM | POA: Diagnosis not present

## 2021-03-25 DIAGNOSIS — Z91040 Latex allergy status: Secondary | ICD-10-CM | POA: Insufficient documentation

## 2021-03-25 DIAGNOSIS — D171 Benign lipomatous neoplasm of skin and subcutaneous tissue of trunk: Secondary | ICD-10-CM | POA: Diagnosis not present

## 2021-03-25 DIAGNOSIS — R1013 Epigastric pain: Secondary | ICD-10-CM | POA: Insufficient documentation

## 2021-03-25 DIAGNOSIS — Z87891 Personal history of nicotine dependence: Secondary | ICD-10-CM | POA: Diagnosis not present

## 2021-03-25 DIAGNOSIS — C496 Malignant neoplasm of connective and soft tissue of trunk, unspecified: Secondary | ICD-10-CM

## 2021-03-25 DIAGNOSIS — K219 Gastro-esophageal reflux disease without esophagitis: Secondary | ICD-10-CM | POA: Diagnosis not present

## 2021-03-25 LAB — CBC WITH DIFFERENTIAL/PLATELET
Abs Immature Granulocytes: 0.02 10*3/uL (ref 0.00–0.07)
Basophils Absolute: 0.1 10*3/uL (ref 0.0–0.1)
Basophils Relative: 1 %
Eosinophils Absolute: 0.5 10*3/uL (ref 0.0–0.5)
Eosinophils Relative: 7 %
HCT: 39.3 % (ref 36.0–46.0)
Hemoglobin: 13.9 g/dL (ref 12.0–15.0)
Immature Granulocytes: 0 %
Lymphocytes Relative: 33 %
Lymphs Abs: 2.7 10*3/uL (ref 0.7–4.0)
MCH: 30.6 pg (ref 26.0–34.0)
MCHC: 35.4 g/dL (ref 30.0–36.0)
MCV: 86.6 fL (ref 80.0–100.0)
Monocytes Absolute: 0.6 10*3/uL (ref 0.1–1.0)
Monocytes Relative: 7 %
Neutro Abs: 4.3 10*3/uL (ref 1.7–7.7)
Neutrophils Relative %: 52 %
Platelets: 322 10*3/uL (ref 150–400)
RBC: 4.54 MIL/uL (ref 3.87–5.11)
RDW: 12.3 % (ref 11.5–15.5)
WBC: 8.2 10*3/uL (ref 4.0–10.5)
nRBC: 0 % (ref 0.0–0.2)

## 2021-03-25 MED ORDER — KETOROLAC TROMETHAMINE 30 MG/ML IJ SOLN
15.0000 mg | Freq: Once | INTRAMUSCULAR | Status: AC
  Administered 2021-03-25: 15 mg via INTRAVENOUS
  Filled 2021-03-25: qty 1

## 2021-03-25 MED ORDER — ONDANSETRON HCL 4 MG/2ML IJ SOLN
4.0000 mg | Freq: Once | INTRAMUSCULAR | Status: AC
  Administered 2021-03-25: 4 mg via INTRAVENOUS
  Filled 2021-03-25: qty 2

## 2021-03-25 NOTE — ED Triage Notes (Signed)
Pt in with co upper abd pain states has a "bulge" to same area for 5 years. Pt states abd pain radiates through to back and up to neck and head.

## 2021-03-25 NOTE — Progress Notes (Signed)
Metrics: Intervention Frequency ACO  Documented Smoking Status Yearly  Screened one or more times in 24 months  Cessation Counseling or  Active cessation medication Past 24 months  Past 24 months   Guideline developer: UpToDate (See UpToDate for funding source) Date Released: 2014       Wellness Office Visit  Subjective:  Patient ID: Cristina Davenport, female    DOB: [birth year]  Age: 35 y.o. MRN: [id number]  CC: Abdominal pain. HPI  This lady comes in for an acute visit with a 70-month history of abdominal pain which is midway between the umbilicus and the epigastric area.  The pain for the last week has been occurring on a daily basis and is constant throughout the day.  It starts off being very sharp and then there is a dull background pain.  When it is sharp, it is 10/10 intensity.  It also tends to radiate to the back, worse when she lies flat and better when she sits up.  There is no associated nausea or vomiting.  She denies any fever.  There is a degree of tendency towards constipation. Past Medical History:  Diagnosis Date  . Anxiety   . Anxiety   . Bipolar 1 disorder ([place])   . GERD (gastroesophageal reflux disease)   . Hypertension    PIH  . IBS (irritable bowel syndrome)   . Migraine without aura    Past Surgical History:  Procedure Laterality Date  . ABDOMINAL SURGERY     cyst removed   . CHOLECYSTECTOMY N/A 07/10/2013   Procedure: LAPAROSCOPIC CHOLECYSTECTOMY;  Surgeon: Donato Heinz, MD;  Location: [facility];  Service: General;  Laterality: N/A;  . ESOPHAGOGASTRODUODENOSCOPY N/A 04/30/2013   PPI:[id number] distal esophagus of uncertain clinical significance-status post biopsy. Tiny hiatal hernia. No explanation  . EXAMINATION UNDER ANESTHESIA  04/21/2012   Gluteal wound repair  . WISDOM TOOTH EXTRACTION       Family History  Adopted: Yes  Problem Relation Age of Onset  . Mental illness Mother   . Cancer Father        skin cancer  . Hypertension Maternal Grandmother    . Stroke Maternal Grandmother   . Kidney failure Maternal Grandmother   . Hypertension Maternal Grandfather   . Stroke Maternal Grandfather   . Cancer Maternal Grandfather        skin  . Crohn's disease Cousin   . Colon cancer Neg Hx     Social History   Social History Narrative   Married since July 2020,been together for 10 years.Lives with husband and kids.   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   Social History   Tobacco Use  . Smoking status: Former Smoker    Packs/day: 0.00    Years: 8.00    Pack years: 0.00    Types: Cigarettes    Quit date: 09/19/2014    Years since quitting: 6.5  . Smokeless tobacco: Never Used  Substance Use Topics  . Alcohol use: Yes    Alcohol/week: 3.0 standard drinks    Types: 3 Glasses of wine per week    Current Meds  Medication Sig  . amphetamine-dextroamphetamine (ADDERALL XR) 20 MG 24 hr capsule Take 1 capsule (20 mg total) by mouth every morning.  Marland Kitchen amphetamine-dextroamphetamine (ADDERALL XR) 20 MG 24 hr capsule Take 1 capsule (20 mg total) by mouth daily.  Derrill Memo ON 04/16/2021] amphetamine-dextroamphetamine (ADDERALL XR) 20 MG 24 hr capsule Take 1 capsule (20 mg total)  by mouth daily.  Marland Kitchen amphetamine-dextroamphetamine (ADDERALL) 10 MG tablet Take 1 tablet (10 mg total) by mouth daily.  Marland Kitchen amphetamine-dextroamphetamine (ADDERALL) 10 MG tablet Take 1 tablet (10 mg total) by mouth daily.  Derrill Memo ON 04/16/2021] amphetamine-dextroamphetamine (ADDERALL) 10 MG tablet Take 1 tablet (10 mg total) by mouth daily.  . Cholecalciferol (VITAMIN D3) 50 MCG (2000 UT) TABS Take 2,000 mg by mouth daily.  . Cyanocobalamin (B12 FAST DISSOLVE) 5000 MCG TBDP Take 1 tablet by mouth daily.  . Ferrous Sulfate (IRON) 325 (65 Fe) MG TABS Take 1 tablet by mouth daily.  . hydrOXYzine (ATARAX/VISTARIL) 25 MG tablet TAKE 1 TABLET BY MOUTH AT BEDTIME AS NEEDED.  Marland Kitchen levonorgestrel (LILETTA, 52 MG,) 19.5 MCG/DAY IUD IUD 1 each by Intrauterine  route once.  Marland Kitchen Lysine 1000 MG TABS Take 1,000 mg by mouth daily.  . NP THYROID 60 MG tablet Take 1 tablet (60 mg total) by mouth daily before breakfast.  . progesterone (PROMETRIUM) 200 MG capsule Take 1 capsule (200 mg total) by mouth daily.  . sertraline (ZOLOFT) 100 MG tablet Take two tablets daily.  Marland Kitchen UNABLE TO FIND Med Name: Bio Cleanse takes 2 capsules every night  . valACYclovir (VALTREX) 1000 MG tablet Take 1,000 mg by mouth as needed.     [place] Office Visit from 01/19/2021 in [facility]  PHQ-9 Total Score 18      Objective:   Today's Vitals: BP 120/71 (BP Location: Right Arm, Patient Position: Sitting, Cuff Size: Small)   Pulse 76   Temp 98.4 F (36.9 C) (Temporal)   Resp 17   Ht 5\' 1"  (1.549 m)   Wt 131 lb 8 oz (59.6 kg)   SpO2 99%   BMI 24.85 kg/m  Vitals with BMI 03/25/2021 03/10/2021 02/04/2021  Height 5\' 1"  5\' 1"  5\' 1"   Weight 131 lbs 8 oz 135 lbs 10 oz 137 lbs  BMI 24.86 13.24 40.1  Systolic 027 253 664  Diastolic 71 70 70  Pulse 76 95 90  Some encounter information is confidential and restricted. Go to Review Flowsheets activity to see all data.     Physical Exam  She does not appear to be in acute pain.  Abdominal examination on deep palpation reveals a firmness, possibly stool but I cannot be certain.  There is no hepatosplenomegaly.  There is no rebound tenderness.  Bowel sounds are present and normal.     Assessment   1. Lower abdominal pain       Tests ordered Orders Placed This Encounter  Procedures  . CT ABDOMEN WO CONTRAST     Plan: 1. I will get a CT scan of the abdomen for further evaluation.  Further recommendations will depend on this.   No orders of the defined types were placed in this encounter.   Doree Albee, MD

## 2021-03-25 NOTE — Progress Notes (Signed)
The pain she said is happen on and off constantly almost daily. For adderall she stated the provider puts it on auto refill so she does not have to come back as much.

## 2021-03-26 ENCOUNTER — Emergency Department: Payer: 59

## 2021-03-26 DIAGNOSIS — R1013 Epigastric pain: Secondary | ICD-10-CM | POA: Diagnosis not present

## 2021-03-26 LAB — COMPREHENSIVE METABOLIC PANEL
ALT: 14 U/L (ref 0–44)
AST: 16 U/L (ref 15–41)
Albumin: 4.5 g/dL (ref 3.5–5.0)
Alkaline Phosphatase: 42 U/L (ref 38–126)
Anion gap: 8 (ref 5–15)
BUN: 11 mg/dL (ref 6–20)
CO2: 28 mmol/L (ref 22–32)
Calcium: 9 mg/dL (ref 8.9–10.3)
Chloride: 104 mmol/L (ref 98–111)
Creatinine, Ser: 0.75 mg/dL (ref 0.44–1.00)
GFR, Estimated: >60 mL/min (ref >60)
Glucose, Bld: 94 mg/dL (ref 70–99)
Potassium: 3.7 mmol/L (ref 3.5–5.1)
Sodium: 140 mmol/L (ref 135–145)
Total Bilirubin: 0.6 mg/dL (ref 0.3–1.2)
Total Protein: 7.3 g/dL (ref 6.5–8.1)

## 2021-03-26 LAB — URINALYSIS, COMPLETE (UACMP) WITH MICROSCOPIC
Bilirubin Urine: NEGATIVE
Glucose, UA: NEGATIVE mg/dL
Ketones, ur: NEGATIVE mg/dL
Leukocytes,Ua: NEGATIVE
Nitrite: NEGATIVE
Protein, ur: NEGATIVE mg/dL
Specific Gravity, Urine: 1.02 (ref 1.005–1.030)
pH: 7 (ref 5.0–8.0)

## 2021-03-26 LAB — POC URINE PREG, ED: Preg Test, Ur: NEGATIVE

## 2021-03-26 LAB — LIPASE, BLOOD: Lipase: 48 U/L (ref 11–51)

## 2021-03-26 IMAGING — CT CT ABD-PELV W/O CM
2 of 4 series · 16 of 46 positions shown, 18 images · non-contrast
Comparison: CT abdomen pelvis [DATE]

CLINICAL DATA: Epigastric pain. upper abd pain states has a "bulge"
to same area for 5 years. Pt states abd pain radiates through to
back.

EXAM:
CT ABDOMEN AND PELVIS WITHOUT CONTRAST
TECHNIQUE: Multidetector CT imaging of the abdomen and pelvis was performed
following the standard protocol without IV contrast.

[Series 2: routine abd/pel wo · axial · 0.73mm/px · z∈[-998,-563]mm · 13 of 95 slices shown, 15 images]
[im 4/95  soft-tissue]
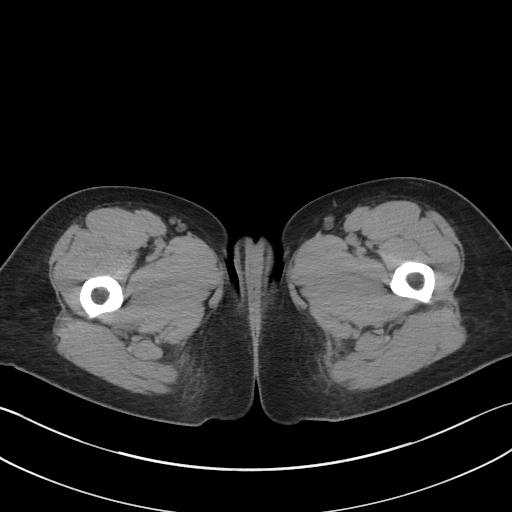
[im 4/95  bone]
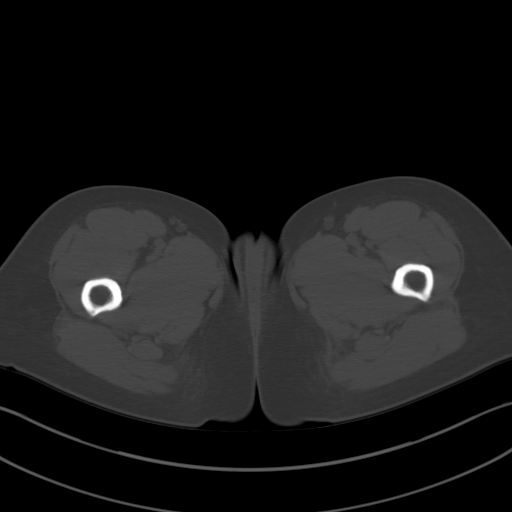
[im 12/95  soft-tissue]
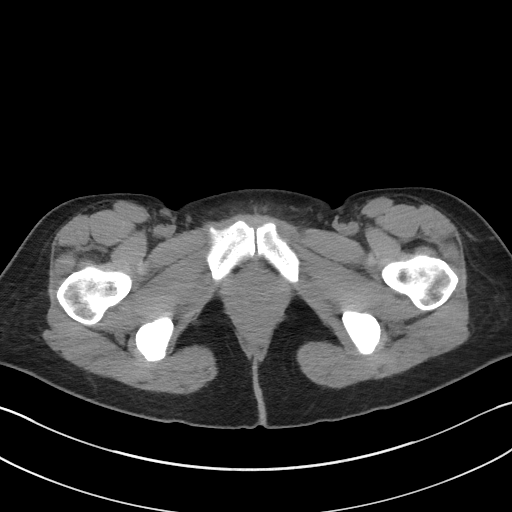
[im 20/95  soft-tissue]
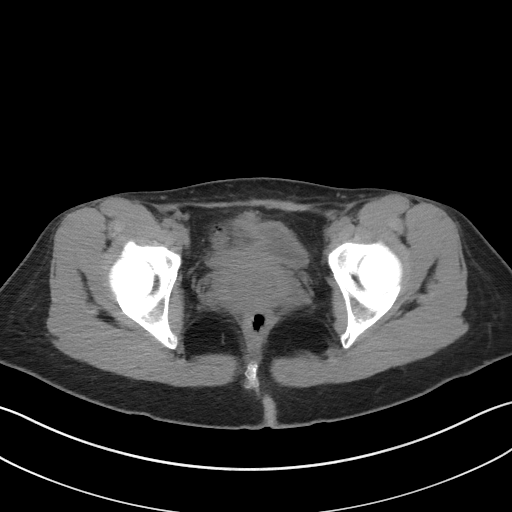
[im 28/95  soft-tissue]
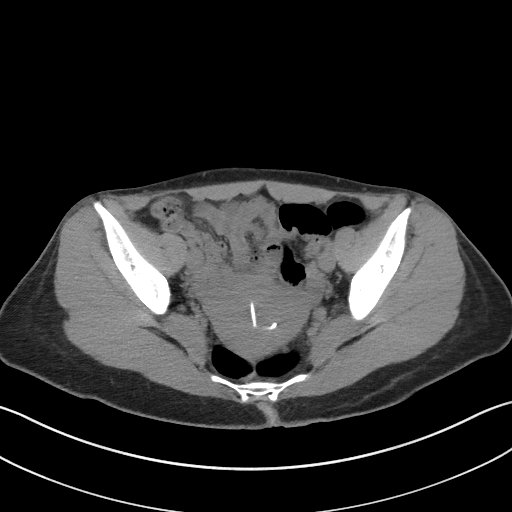
[im 32/95  soft-tissue]
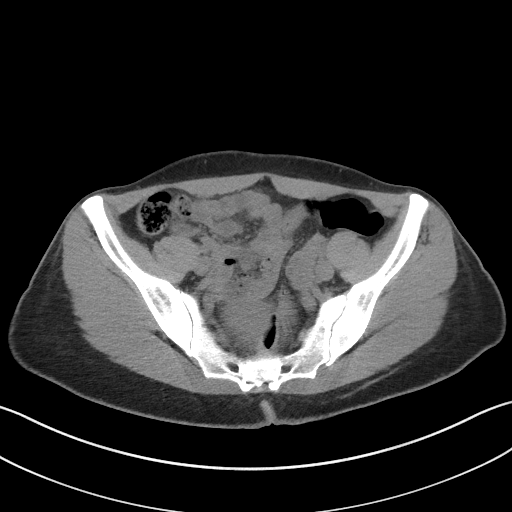
[im 40/95  soft-tissue]
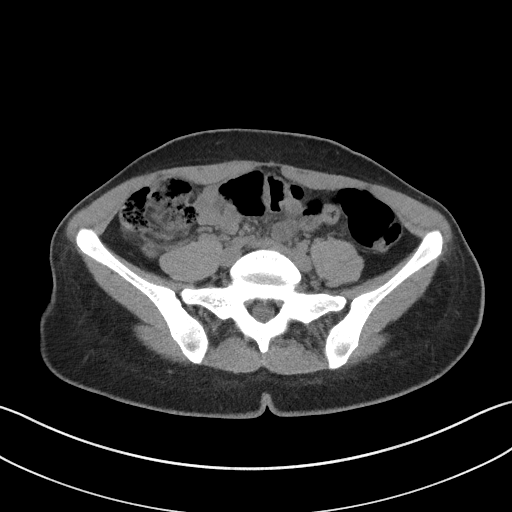
[im 48/95  soft-tissue]
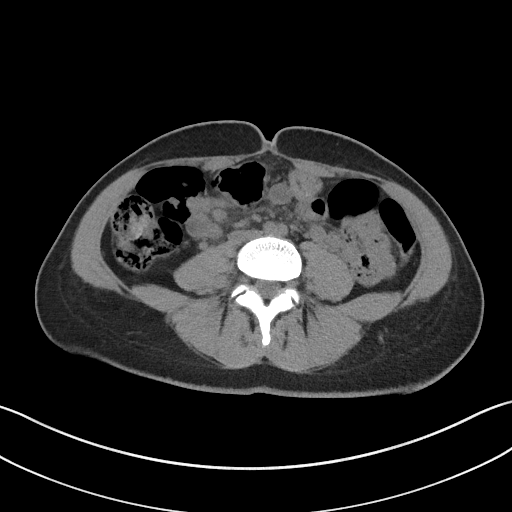
[im 55/95  soft-tissue]
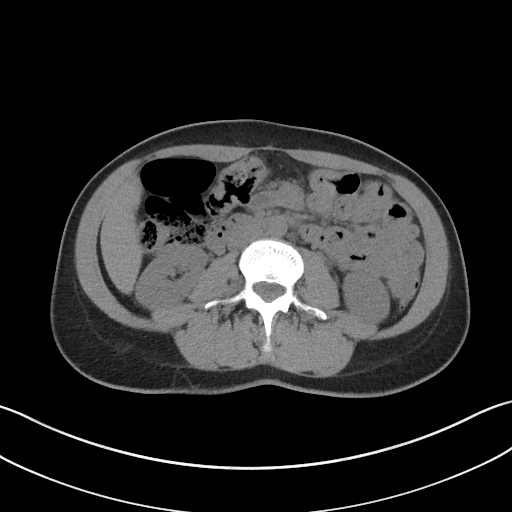
[im 63/95  soft-tissue]
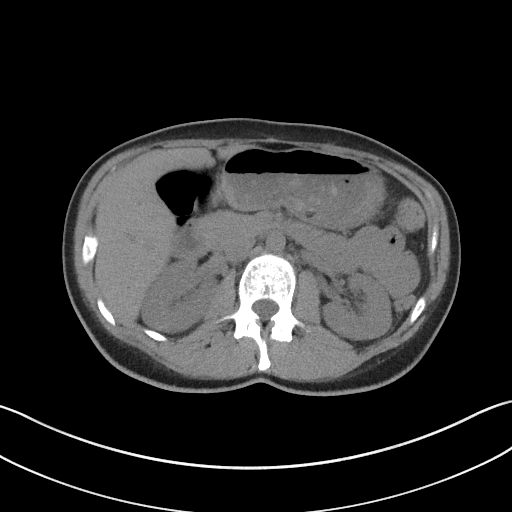
[im 63/95  bone]
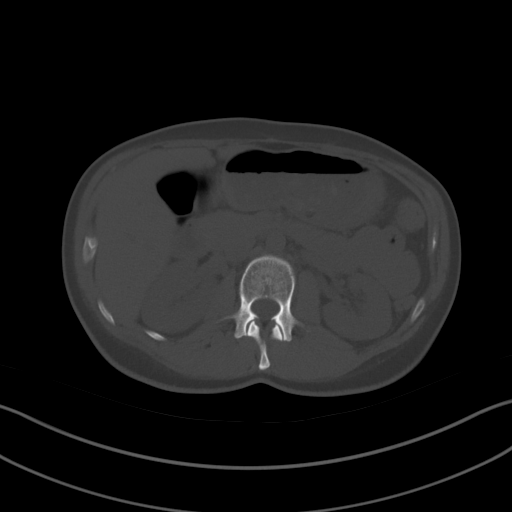
[im 67/95  soft-tissue]
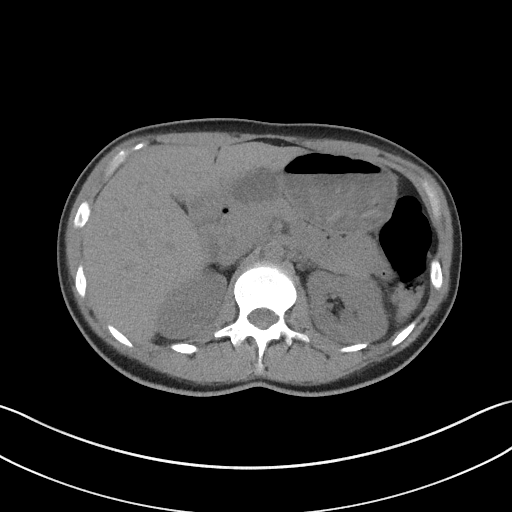
[im 75/95  soft-tissue]
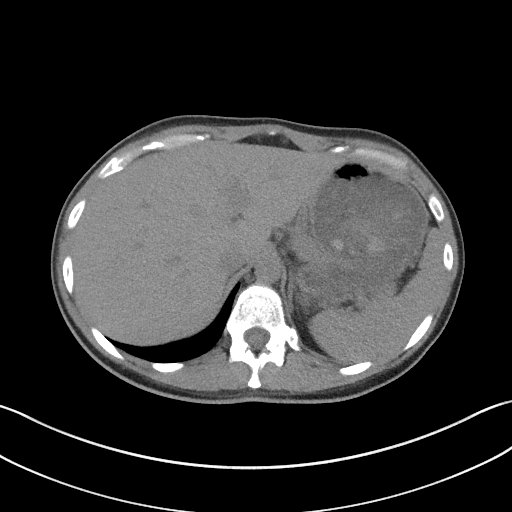
[im 83/95  soft-tissue]
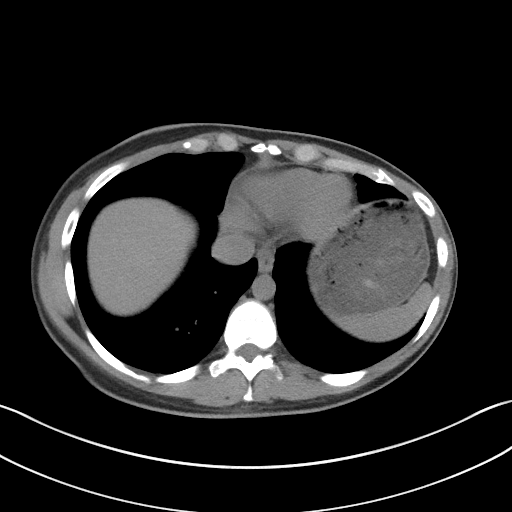
[im 91/95  soft-tissue]
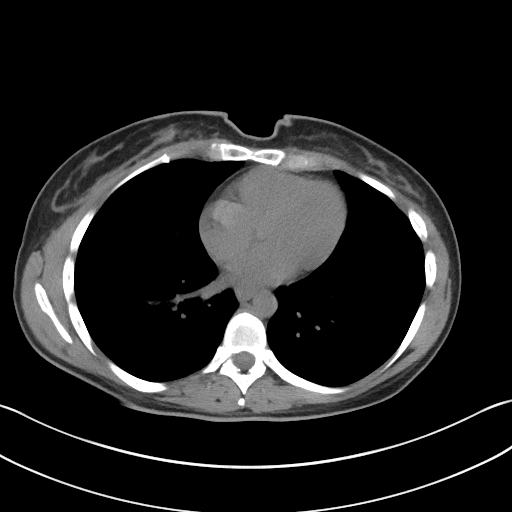

[Series 5: coronal st · coronal · 0.82mm/px · 3 of 73 slices shown]
[im 25/73  soft-tissue]
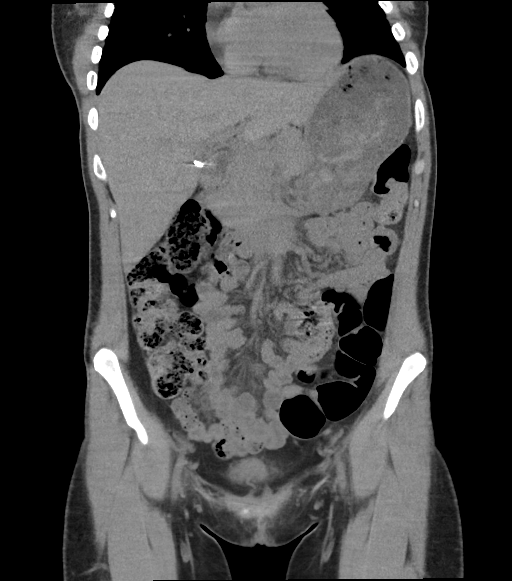
[im 33/73  soft-tissue]
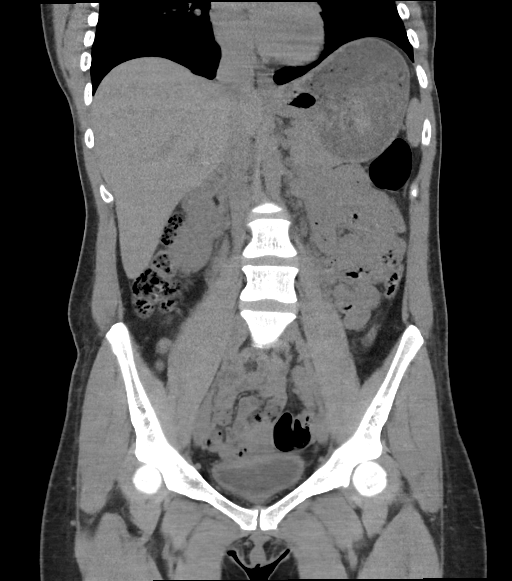
[im 41/73  soft-tissue]
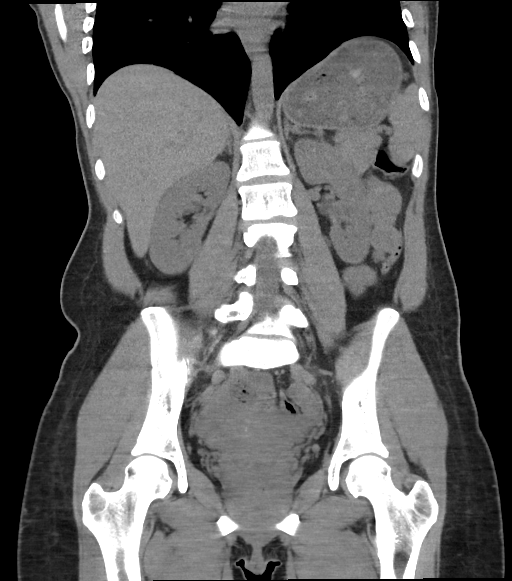

[16 of 46 positions shown; findings below may reference images not displayed]

FINDINGS: Lower chest: No acute abnormality.

Hepatobiliary: No focal liver abnormality is seen. Status post
cholecystectomy. No biliary dilatation.

Pancreas: No focal lesion. Normal pancreatic contour. No surrounding
inflammatory changes. No main pancreatic ductal dilatation.

Spleen: Normal in size without focal abnormality.

Adrenals/Urinary Tract:

No adrenal nodule bilaterally.

No nephrolithiasis, no hydronephrosis, and no contour-deforming
renal mass. No ureterolithiasis or hydroureter.

Circumferential urinary bladder wall thickening likely due to under
distension.

Stomach/Bowel: Stomach is within normal limits. No evidence of bowel
wall thickening or dilatation. Cecal diverticula. Appendix appears
normal.

Vascular/Lymphatic: No significant vascular findings are present. No
enlarged abdominal or pelvic lymph nodes.

Reproductive: The uterus is retroverted. T-shaped intrauterine
device is noted in grossly appropriate position. Uterus and
bilateral adnexa are unremarkable.

Other: No intraperitoneal free fluid. No intraperitoneal free gas.
No organized fluid collection.

Musculoskeletal: Interval development of a 3.4 x 1.1 cm fat density
lesion within the subcutaneus soft tissues of the supraumbilical
abdominal wall slightly to the right of midline ([DATE]). This region
correlates to a prior imaged region in [MF]. Intralesional slight
fat stranding noted. No acute or significant osseous findings.
IMPRESSION: 1. Interval development of a slightly complex 3.4 x 1.1 cm
lipomatous lesion within the anterior abdominal soft tissues
slightly to the right of midline. This region of the soft tissues
was previously evaluated on CT abdomen pelvis in [MF] at which time
no lipomatous lesion is identified. Recommend nonemergent MRI for
further evaluation of this lesion to exclude a liposarcoma.
2. Cecal diverticula with no acute diverticulitis.

## 2021-03-26 NOTE — Discharge Instructions (Addendum)
As I explained to you, the mass you feel looks like a fatty lesion on CT which could be benign but also there is a chance it could be cancer. You need to get a MRI to better determine what this mass is. Therefore you should follow up with your doctor for the MRI to be done. Make sure to call your doctor first thing in the morning to have this test scheduled as soon as possible. You may also follow up with general surgery to have this mass removed.

## 2021-03-26 NOTE — ED Provider Notes (Signed)
[facility] Emergency Department Provider Note  ____________________________________________  Time seen: Approximately 12:00 AM  I have reviewed the triage vital signs and the nursing notes.   HISTORY  Chief Complaint Abdominal Pain   HPI Makinlee Awwad Sharol Roussel is a 35 y.o. female with history of anxiety, bipolar, GERD, hypertension, IBS who presents for evaluation of abdominal pain.  Patient reports having intermittent epigastric abdominal pain for about 5 years.  Initially the pain was occasionally and has become more constant.  She has had at least once a week and over the last several days has had a daily.  She saw her primary care doctor earlier today who ordered an outpatient CT which has not been done yet.  This evening she reports that she developed a new symptom associated with the pain.  She describes a sharp pain in her epigastric region followed by a dull back pain which radiates up her spine and causes a warmth/numb feeling on the back of her head.  She reports having nausea constantly but no vomiting, no photophobia, no fever or chills, no constipation or diarrhea.   Past Medical History:  Diagnosis Date  . Anxiety   . Anxiety   . Bipolar 1 disorder ([place])   . GERD (gastroesophageal reflux disease)   . Hypertension    PIH  . IBS (irritable bowel syndrome)   . Migraine without aura     Patient Active Problem List   Diagnosis Date Noted  . Attention deficit hyperactivity disorder (ADHD), combined type 03/20/2020  . Encounter for IUD insertion 02/24/2020  . Genital warts 06/21/2019  . HSV-1 infection 05/10/2019  . Post partum depression 01/05/2018  . Hx of preeclampsia, prior pregnancy, currently pregnant 02/16/2017  . Abdominal bloating 03/14/2016  . Migraine headache 03/12/2016  . Anxiety 04/17/2015  . Low grade squamous intraepithelial lesion on cytologic smear of cervix (LGSIL) 09/15/2014  . Adjustment disorder with anxious mood 09/14/2014     Past Surgical History:  Procedure Laterality Date  . ABDOMINAL SURGERY     cyst removed   . CHOLECYSTECTOMY N/A 07/10/2013   Procedure: LAPAROSCOPIC CHOLECYSTECTOMY;  Surgeon: Donato Heinz, MD;  Location: [facility];  Service: General;  Laterality: N/A;  . ESOPHAGOGASTRODUODENOSCOPY N/A 04/30/2013   XNT:[id number] distal esophagus of uncertain clinical significance-status post biopsy. Tiny hiatal hernia. No explanation  . EXAMINATION UNDER ANESTHESIA  04/21/2012   Gluteal wound repair  . WISDOM TOOTH EXTRACTION      Prior to Admission medications   Medication Sig Start Date End Date Taking? Authorizing Provider  amphetamine-dextroamphetamine (ADDERALL XR) 20 MG 24 hr capsule Take 1 capsule (20 mg total) by mouth every morning. 02/19/21   Mozingo, Berdie Ogren, NP  amphetamine-dextroamphetamine (ADDERALL XR) 20 MG 24 hr capsule Take 1 capsule (20 mg total) by mouth daily. 03/19/21   Mozingo, Berdie Ogren, NP  amphetamine-dextroamphetamine (ADDERALL XR) 20 MG 24 hr capsule Take 1 capsule (20 mg total) by mouth daily. 04/16/21   Mozingo, Berdie Ogren, NP  amphetamine-dextroamphetamine (ADDERALL) 10 MG tablet Take 1 tablet (10 mg total) by mouth daily. 02/19/21   Mozingo, Berdie Ogren, NP  amphetamine-dextroamphetamine (ADDERALL) 10 MG tablet Take 1 tablet (10 mg total) by mouth daily. 03/19/21   Mozingo, Berdie Ogren, NP  amphetamine-dextroamphetamine (ADDERALL) 10 MG tablet Take 1 tablet (10 mg total) by mouth daily. 04/16/21   Mozingo, Berdie Ogren, NP  Cholecalciferol (VITAMIN D3) 50 MCG (2000 UT) TABS Take 2,000 mg by mouth daily.    [provider]  Cyanocobalamin (B12 FAST DISSOLVE) 5000 MCG TBDP Take 1 tablet by mouth daily.    [provider]  Ferrous Sulfate (IRON) 325 (65 Fe) MG TABS Take 1 tablet by mouth daily.    [provider]  hydrOXYzine (ATARAX/VISTARIL) 25 MG tablet TAKE 1 TABLET BY MOUTH AT BEDTIME AS NEEDED. 12/25/20   Nilda Simmer, NP   levonorgestrel (LILETTA, 52 MG,) 19.5 MCG/DAY IUD IUD 1 each by Intrauterine route once.    [provider]  Lysine 1000 MG TABS Take 1,000 mg by mouth daily.    [provider]  NP THYROID 60 MG tablet Take 1 tablet (60 mg total) by mouth daily before breakfast. 03/10/21   Hurshel Party C, MD  progesterone (PROMETRIUM) 200 MG capsule Take 1 capsule (200 mg total) by mouth daily. 01/19/21   Doree Albee, MD  sertraline (ZOLOFT) 100 MG tablet Take two tablets daily. 02/19/21   Mozingo, Berdie Ogren, NP  UNABLE TO FIND Med Name: Bio Cleanse takes 2 capsules every night    [provider]  valACYclovir (VALTREX) 1000 MG tablet Take 1,000 mg by mouth as needed.    [provider]    Allergies Latex and Doxycycline  Family History  Adopted: Yes  Problem Relation Age of Onset  . Mental illness Mother   . Cancer Father        skin cancer  . Hypertension Maternal Grandmother   . Stroke Maternal Grandmother   . Kidney failure Maternal Grandmother   . Hypertension Maternal Grandfather   . Stroke Maternal Grandfather   . Cancer Maternal Grandfather        skin  . Crohn's disease Cousin   . Colon cancer Neg Hx     Social History Social History   Tobacco Use  . Smoking status: Former Smoker    Packs/day: 0.00    Years: 8.00    Pack years: 0.00    Types: Cigarettes    Quit date: 09/19/2014    Years since quitting: 6.5  . Smokeless tobacco: Never Used  Vaping Use  . Vaping Use: Every day  Substance Use Topics  . Alcohol use: Yes    Alcohol/week: 3.0 standard drinks    Types: 3 Glasses of wine per week  . Drug use: No    Review of Systems  Constitutional: Negative for fever. Eyes: Negative for visual changes. ENT: Negative for sore throat. Neck: No neck pain  Cardiovascular: Negative for chest pain. Respiratory: Negative for shortness of breath. Gastrointestinal: + epigastric abdominal pain and nausea. No vomiting or  diarrhea. Genitourinary: Negative for dysuria. Musculoskeletal: Negative for back pain. Skin: Negative for rash. Neurological: Negative for headaches, weakness or numbness. Psych: No SI or HI  ____________________________________________   PHYSICAL EXAM:  VITAL SIGNS: Vitals:   03/25/21 2239 03/25/21 2240  BP: 125/86 125/86  Pulse: (!) 107 98  Resp: 20 20  Temp: 98.3 F (36.8 C) 98.3 F (36.8 C)  SpO2: 99% 99%    Constitutional: Alert and oriented. Well appearing and in no apparent distress. HEENT:      Head: Normocephalic and atraumatic.         Eyes: Conjunctivae are normal. Sclera is non-icteric.       Mouth/Throat: Mucous membranes are moist.       Neck: Supple with no signs of meningismus. Cardiovascular: Regular rate and rhythm. No murmurs, gallops, or rubs. 2+ symmetrical distal pulses are present in all extremities. No JVD. Respiratory: Normal respiratory  effort. Lungs are clear to auscultation bilaterally.  Gastrointestinal: Soft, she does have a palpable mass on the epigastric region which is where is she tender, non distended with positive bowel sounds. No rebound or guarding. Genitourinary: No CVA tenderness. Musculoskeletal:  No edema, cyanosis, or erythema of extremities. Neurologic: Normal speech and language. Face is symmetric. Moving all extremities. No gross focal neurologic deficits are appreciated. Skin: Skin is warm, dry and intact. No rash noted. Psychiatric: Mood and affect are normal. Speech and behavior are normal.  ____________________________________________   LABS (all labs ordered are listed, but only abnormal results are displayed)  Labs Reviewed  URINALYSIS, COMPLETE (UACMP) WITH MICROSCOPIC - Abnormal; Notable for the following components:      Result Value   Color, Urine YELLOW (*)    APPearance CLOUDY (*)    Hgb urine dipstick SMALL (*)    Bacteria, UA RARE (*)    All other components within normal limits  CBC WITH  DIFFERENTIAL/PLATELET  COMPREHENSIVE METABOLIC PANEL  LIPASE, BLOOD  POC URINE PREG, ED   ____________________________________________  EKG  none  ____________________________________________  RADIOLOGY  I have personally reviewed the images performed during this visit and I agree with the Radiologist's read.   Interpretation by Radiologist:  CT ABDOMEN PELVIS WO CONTRAST  Result Date: 03/26/2021 CLINICAL DATA:  Epigastric pain. upper abd pain states has a "bulge" to same area for 5 years. Pt states abd pain radiates through to back. EXAM: CT ABDOMEN AND PELVIS WITHOUT CONTRAST TECHNIQUE: Multidetector CT imaging of the abdomen and pelvis was performed following the standard protocol without IV contrast. COMPARISON:  CT abdomen pelvis 03/28/2006 FINDINGS: Lower chest: No acute abnormality. Hepatobiliary: No focal liver abnormality is seen. Status post cholecystectomy. No biliary dilatation. Pancreas: No focal lesion. Normal pancreatic contour. No surrounding inflammatory changes. No main pancreatic ductal dilatation. Spleen: Normal in size without focal abnormality. Adrenals/Urinary Tract: No adrenal nodule bilaterally. No nephrolithiasis, no hydronephrosis, and no contour-deforming renal mass. No ureterolithiasis or hydroureter. Circumferential urinary bladder wall thickening likely due to under distension. Stomach/Bowel: Stomach is within normal limits. No evidence of bowel wall thickening or dilatation. Cecal diverticula. Appendix appears normal. Vascular/Lymphatic: No significant vascular findings are present. No enlarged abdominal or pelvic lymph nodes. Reproductive: The uterus is retroverted. T-shaped intrauterine device is noted in grossly appropriate position. Uterus and bilateral adnexa are unremarkable. Other: No intraperitoneal free fluid. No intraperitoneal free gas. No organized fluid collection. Musculoskeletal: Interval development of a 3.4 x 1.1 cm fat density lesion within the  subcutaneus soft tissues of the supraumbilical abdominal wall slightly to the right of midline (2:38). This region correlates to a prior imaged region in 2017. Intralesional slight fat stranding noted. No acute or significant osseous findings. IMPRESSION: 1. Interval development of a slightly complex 3.4 x 1.1 cm lipomatous lesion within the anterior abdominal soft tissues slightly to the right of midline. This region of the soft tissues was previously evaluated on CT abdomen pelvis in 2017 at which time no lipomatous lesion is identified. Recommend nonemergent MRI for further evaluation of this lesion to exclude a liposarcoma. 2. Cecal diverticula with no acute diverticulitis. Electronically Signed   By: Iven Finn M.D.   On: 03/26/2021 01:08      ____________________________________________   PROCEDURES  Procedure(s) performed: None Procedures Critical Care performed:  None ____________________________________________   INITIAL IMPRESSION / ASSESSMENT AND PLAN / ED COURSE  35 y.o. female with history of anxiety, bipolar, GERD, hypertension, IBS who presents for evaluation of  abdominal pain.  Patient with intermittent abdominal pain for 5 years and a palpable mass in the epigastric region.  Now reports that the pain is not only in her abdomen but seems to go up and down her spine and also causing a warmth numbing sensation of her head.  Is otherwise well-appearing in no distress with normal vital signs.  She does have a palpable mass on the epigastric region which is where she is tender.  She is neurologically intact otherwise.  Will get basic labs and a CT scan.  Ddx hernia vs malignancy vs benign tumor  Will give toradol and zofran for symptoms  Old medical records review including a CT scan from 5 years ago which was unremarkable.    _________________________ 1:17 AM on 03/26/2021 -----------------------------------------  CT visualized by me and read by radiology with a  slight complex 3 x 1 cm lesion in the anterior abdominal wall which is the area of palpable by me.  Radiology seems to think this could be a lipoma but recommended MRI as an outpatient to rule out liposarcoma.  Labs are all within normal limits.  Will refer patient back to her primary care doctor for the MRI.  Discussed standard return precautions.  _____________________________________________ Please note:  Patient was evaluated in Emergency Department today for the symptoms described in the history of present illness. Patient was evaluated in the context of the global COVID-19 pandemic, which necessitated consideration that the patient might be at risk for infection with the SARS-CoV-2 virus that causes COVID-19. Institutional protocols and algorithms that pertain to the evaluation of patients at risk for COVID-19 are in a state of rapid change based on information released by regulatory bodies including the CDC and federal and state organizations. These policies and algorithms were followed during the patient's care in the ED.  Some ED evaluations and interventions may be delayed as a result of limited staffing during the pandemic.   [place] Controlled Substance Database was reviewed by me. 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    Alfred Levins, [place], MD 03/26/21 [phone]

## 2021-03-27 ENCOUNTER — Encounter (INDEPENDENT_AMBULATORY_CARE_PROVIDER_SITE_OTHER): Payer: Self-pay | Admitting: Internal Medicine

## 2021-03-28 ENCOUNTER — Emergency Department (HOSPITAL_COMMUNITY): Payer: 59

## 2021-03-28 ENCOUNTER — Emergency Department (HOSPITAL_COMMUNITY)
Admission: EM | Admit: 2021-03-28 | Discharge: 2021-03-28 | Disposition: A | Discharge status: Home / Self Care | Payer: 59 | Attending: Emergency Medicine | Admitting: Emergency Medicine

## 2021-03-28 ENCOUNTER — Encounter (HOSPITAL_COMMUNITY): Payer: Self-pay | Admitting: Radiology

## 2021-03-28 DIAGNOSIS — R000 Tachycardia, unspecified: Secondary | ICD-10-CM | POA: Diagnosis not present

## 2021-03-28 DIAGNOSIS — M50223 Other cervical disc displacement at C6-C7 level: Secondary | ICD-10-CM | POA: Diagnosis not present

## 2021-03-28 DIAGNOSIS — Z79899 Other long term (current) drug therapy: Secondary | ICD-10-CM | POA: Insufficient documentation

## 2021-03-28 DIAGNOSIS — M549 Dorsalgia, unspecified: Secondary | ICD-10-CM

## 2021-03-28 DIAGNOSIS — R519 Headache, unspecified: Secondary | ICD-10-CM | POA: Diagnosis present

## 2021-03-28 DIAGNOSIS — M542 Cervicalgia: Secondary | ICD-10-CM | POA: Insufficient documentation

## 2021-03-28 DIAGNOSIS — J019 Acute sinusitis, unspecified: Secondary | ICD-10-CM | POA: Diagnosis not present

## 2021-03-28 DIAGNOSIS — I10 Essential (primary) hypertension: Secondary | ICD-10-CM | POA: Insufficient documentation

## 2021-03-28 DIAGNOSIS — R200 Anesthesia of skin: Secondary | ICD-10-CM | POA: Diagnosis not present

## 2021-03-28 DIAGNOSIS — R1900 Intra-abdominal and pelvic swelling, mass and lump, unspecified site: Secondary | ICD-10-CM | POA: Diagnosis not present

## 2021-03-28 DIAGNOSIS — R202 Paresthesia of skin: Secondary | ICD-10-CM | POA: Insufficient documentation

## 2021-03-28 DIAGNOSIS — M546 Pain in thoracic spine: Secondary | ICD-10-CM | POA: Insufficient documentation

## 2021-03-28 DIAGNOSIS — Z87891 Personal history of nicotine dependence: Secondary | ICD-10-CM | POA: Insufficient documentation

## 2021-03-28 DIAGNOSIS — Z91040 Latex allergy status: Secondary | ICD-10-CM | POA: Diagnosis not present

## 2021-03-28 DIAGNOSIS — R531 Weakness: Secondary | ICD-10-CM | POA: Diagnosis not present

## 2021-03-28 LAB — CBC WITH DIFFERENTIAL/PLATELET
Abs Immature Granulocytes: 0.01 10*3/uL (ref 0.00–0.07)
Basophils Absolute: 0 10*3/uL (ref 0.0–0.1)
Basophils Relative: 1 %
Eosinophils Absolute: 0.2 10*3/uL (ref 0.0–0.5)
Eosinophils Relative: 3 %
HCT: 43.9 % (ref 36.0–46.0)
Hemoglobin: 14.5 g/dL (ref 12.0–15.0)
Immature Granulocytes: 0 %
Lymphocytes Relative: 33 %
Lymphs Abs: 2.2 10*3/uL (ref 0.7–4.0)
MCH: 30 pg (ref 26.0–34.0)
MCHC: 33 g/dL (ref 30.0–36.0)
MCV: 90.7 fL (ref 80.0–100.0)
Monocytes Absolute: 0.4 10*3/uL (ref 0.1–1.0)
Monocytes Relative: 7 %
Neutro Abs: 3.7 10*3/uL (ref 1.7–7.7)
Neutrophils Relative %: 56 %
Platelets: 292 10*3/uL (ref 150–400)
RBC: 4.84 MIL/uL (ref 3.87–5.11)
RDW: 12.3 % (ref 11.5–15.5)
WBC: 6.5 10*3/uL (ref 4.0–10.5)
nRBC: 0 % (ref 0.0–0.2)

## 2021-03-28 LAB — URINALYSIS, ROUTINE W REFLEX MICROSCOPIC
Bilirubin Urine: NEGATIVE
Glucose, UA: NEGATIVE mg/dL
Hgb urine dipstick: NEGATIVE
Ketones, ur: NEGATIVE mg/dL
Leukocytes,Ua: NEGATIVE
Nitrite: NEGATIVE
Protein, ur: NEGATIVE mg/dL
Specific Gravity, Urine: 1.008 (ref 1.005–1.030)
pH: 7 (ref 5.0–8.0)

## 2021-03-28 LAB — COMPREHENSIVE METABOLIC PANEL
ALT: 13 U/L (ref 0–44)
AST: 16 U/L (ref 15–41)
Albumin: 4.3 g/dL (ref 3.5–5.0)
Alkaline Phosphatase: 42 U/L (ref 38–126)
Anion gap: 7 (ref 5–15)
BUN: 7 mg/dL (ref 6–20)
CO2: 27 mmol/L (ref 22–32)
Calcium: 9.3 mg/dL (ref 8.9–10.3)
Chloride: 105 mmol/L (ref 98–111)
Creatinine, Ser: 0.72 mg/dL (ref 0.44–1.00)
GFR, Estimated: >60 mL/min (ref >60)
Glucose, Bld: 100 mg/dL — ABNORMAL HIGH (ref 70–99)
Potassium: 3.7 mmol/L (ref 3.5–5.1)
Sodium: 139 mmol/L (ref 135–145)
Total Bilirubin: 0.6 mg/dL (ref 0.3–1.2)
Total Protein: 7 g/dL (ref 6.5–8.1)

## 2021-03-28 IMAGING — MR MR HEAD W/O CM
12 of 13 series · 43 of 48 positions shown · non-contrast
Comparison: Prior head CT [DATE].

CLINICAL DATA: Neuro deficit, acute, stroke suspected; transient
diffuse numbness, weakness, and severe pain in head, neck and back
in the context of recent discovery of abdominal mass.

EXAM:
MRI HEAD WITHOUT CONTRAST
TECHNIQUE: Multiplanar, multiecho pulse sequences of the brain and surrounding
structures were obtained without intravenous contrast.

[Series 5: DWI · axial · 3.0mm · 0.88mm/px · z∈[-98,+42]mm · 6 of 96 slices shown (1 of 4)]
[im 1/96]
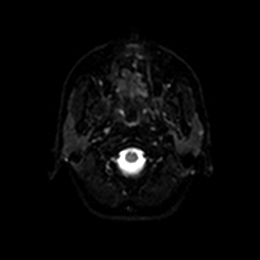
[im 20/96]
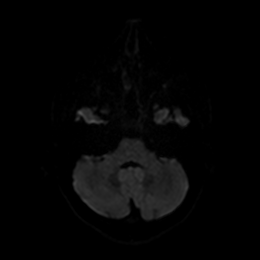
[im 39/96]
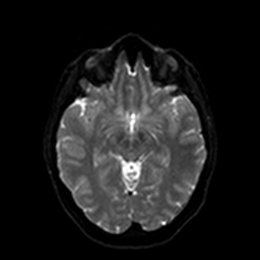
[im 58/96]
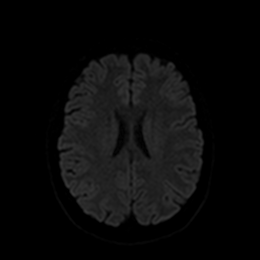
[im 77/96]
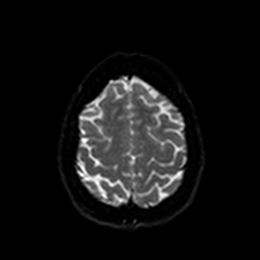
[im 96/96]
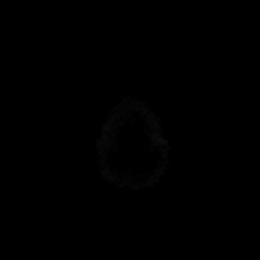

[Series 6: DWI · axial · 3.0mm · 0.88mm/px · z∈[-98,+42]mm · 3 of 48 slices shown (2 of 4)]
[im 1/48]
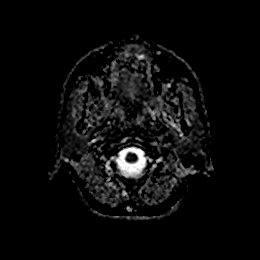
[im 24/48]
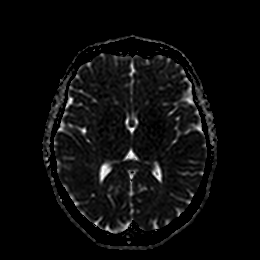
[im 48/48]
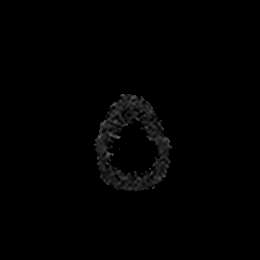

[Series 7: DWI · coronal · 4.0mm · 0.88mm/px · 5 of 68 slices shown (3 of 4)]
[im 1/68]
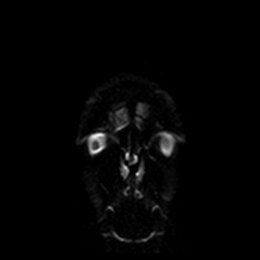
[im 17/68]
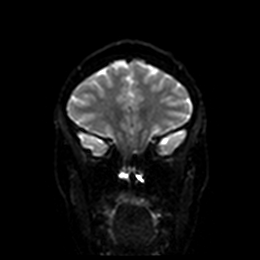
[im 34/68]
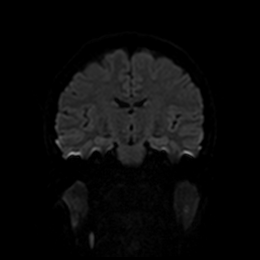
[im 51/68]
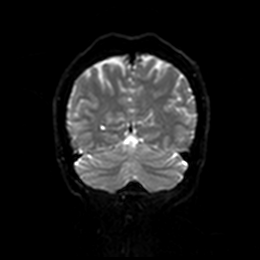
[im 68/68]
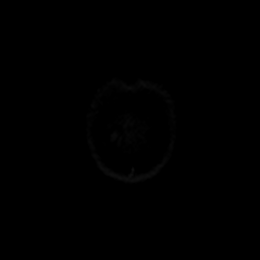

[Series 8: DWI · coronal · 4.0mm · 0.88mm/px · 3 of 34 slices shown (4 of 4)]
[im 1/34]
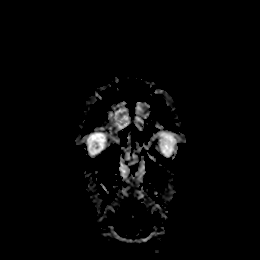
[im 17/34]
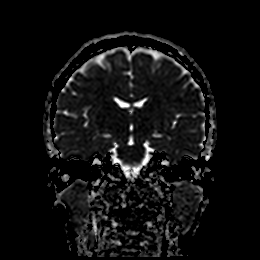
[im 34/34]
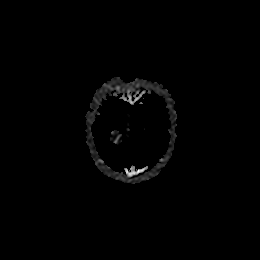

[Series 9: T1 · sagittal · 5.0mm · 0.75mm/px · 2 of 23 slices shown]
[im 1/23]
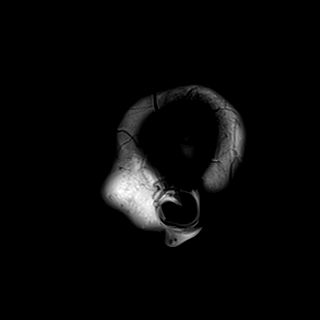
[im 23/23]
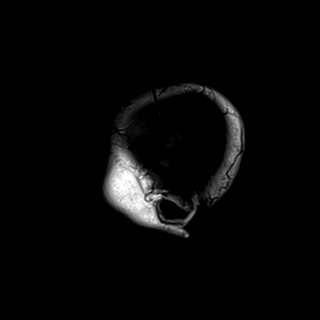

[Series 10: T2 · axial · 5.0mm · 0.72mm/px · z∈[-101,+41]mm · 2 of 25 slices shown (1 of 2)]
[im 1/25]
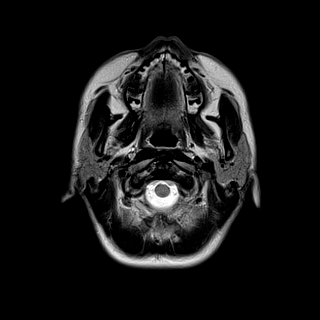
[im 25/25]
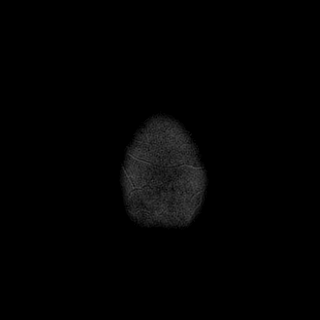

[Series 11: FLAIR · axial · 5.0mm · 0.45mm/px · z∈[-102,+41]mm · 2 of 25 slices shown]
[im 1/25]
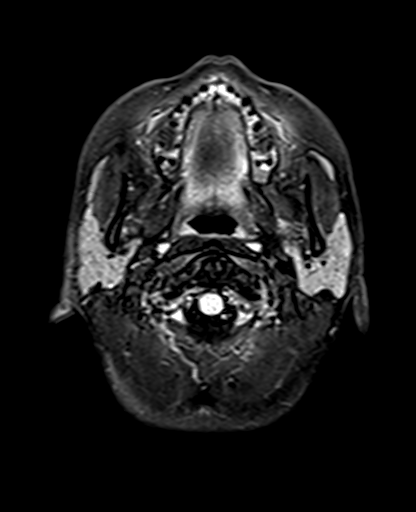
[im 25/25]
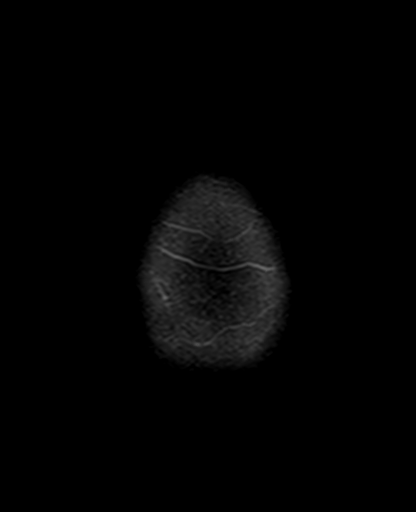

[Series 12: mag_images · axial · 3.0mm · 0.90mm/px · z∈[-121,+55]mm · 5 of 60 slices shown]
[im 1/60]
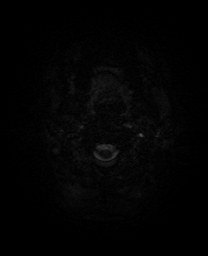
[im 15/60]
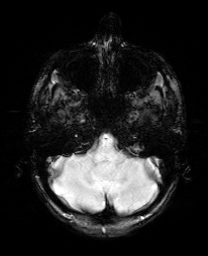
[im 30/60]
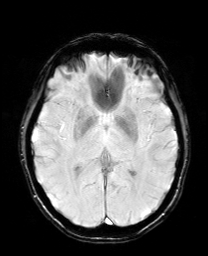
[im 45/60]
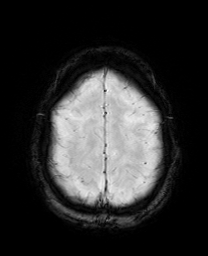
[im 60/60]
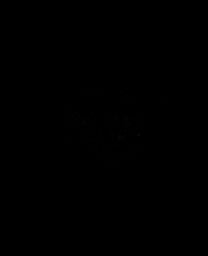

[Series 13: pha_images · axial · 3.0mm · 0.90mm/px · z∈[-121,+43]mm · 4 of 56 slices shown]
[im 1/56]
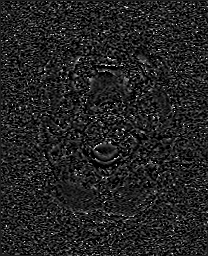
[im 19/56]
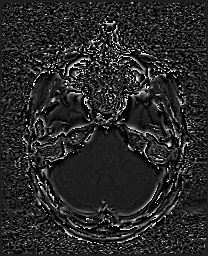
[im 37/56]
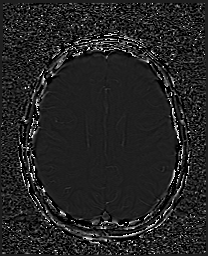
[im 56/56]
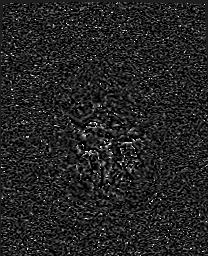

[Series 14: swi_images · axial · 3.0mm · 0.90mm/px · z∈[-121,+55]mm · 5 of 60 slices shown]
[im 1/60]
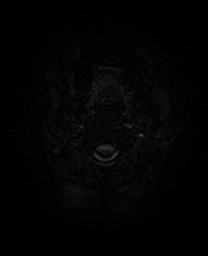
[im 15/60]
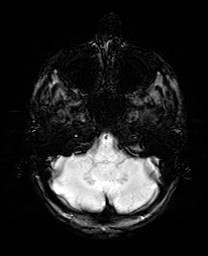
[im 30/60]
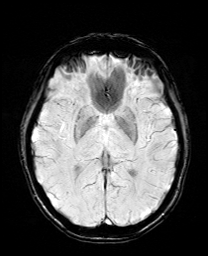
[im 45/60]
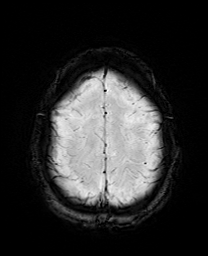
[im 60/60]
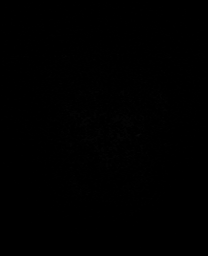

[Series 15: mip_images(sw) · axial · 24.0mm · 0.90mm/px · z∈[-110,+44]mm · 4 of 53 slices shown]
[im 1/53]
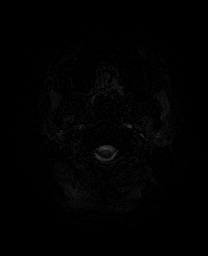
[im 18/53]
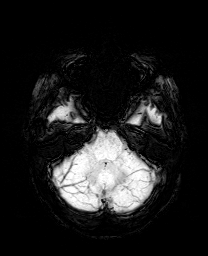
[im 35/53]
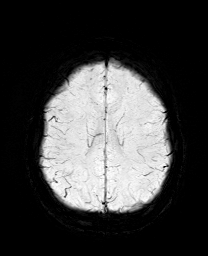
[im 53/53]
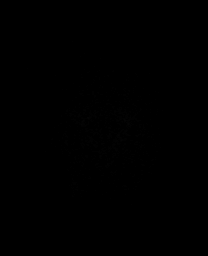

[Series 17: T2 · coronal · 5.0mm · 0.72mm/px · 2 of 28 slices shown (2 of 2)]
[im 1/28]
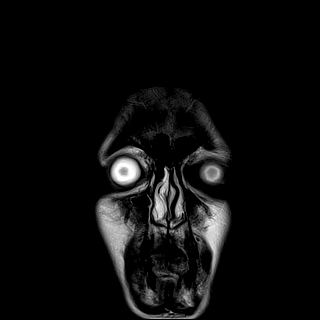
[im 28/28]
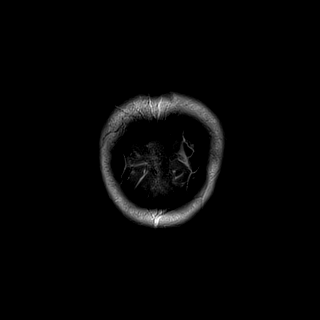

[43 of 48 positions shown; findings below may reference images not displayed]

FINDINGS: Brain:

Cerebral volume is normal.

No cortical encephalomalacia is identified.

No significant white matter disease.

There is no acute infarct.

No evidence of intracranial mass.

No chronic intracranial blood products.

No extra-axial fluid collection.

No midline shift.

Vascular: Expected proximal arterial flow voids.

Skull and upper cervical spine: No focal marrow lesion.

Sinuses/Orbits: Visualized orbits show no acute finding. Moderate
bilateral ethmoid sinus mucosal thickening. Trace bilateral
maxillary sinus mucosal thickening.
IMPRESSION: Unremarkable non-contrast MRI appearance of the brain. No evidence
of acute intracranial abnormality.

Paranasal sinus disease, as described.

## 2021-03-28 IMAGING — MR MR THORACIC SPINE W/O CM
7 of 9 series · 30 of 48 positions shown · non-contrast
Comparison: CT chest [DATE]. CT abdomen/pelvis

CLINICAL DATA: Mid back pain; transient diffuse numbness, weakness,
severe pain in head, neck and back in the context of recent
discovery of abdominal mass.

EXAM:
MRI THORACIC SPINE WITHOUT CONTRAST
TECHNIQUE: Multiplanar, multisequence MR imaging of the thoracic spine was
performed. No intravenous contrast was administered.

[Series 16: T1 · sagittal · 5.0mm · 1.46mm/px · 2 of 9 slices shown (1 of 5)]
[im 1/9]
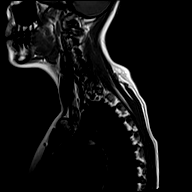
[im 9/9]
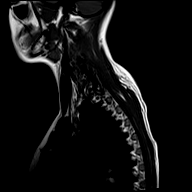

[Series 17: T1 · sagittal · 5.0mm · 1.23mm/px · 2 of 9 slices shown (2 of 5)]
[im 1/9]
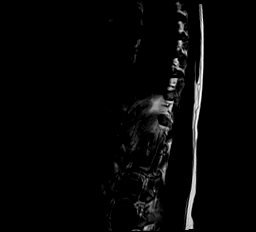
[im 9/9]
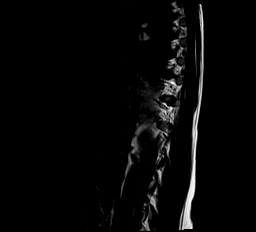

[Series 18: T1 · sagittal · 6.0mm · 1.23mm/px · 2 of 9 slices shown (3 of 5)]
[im 1/9]
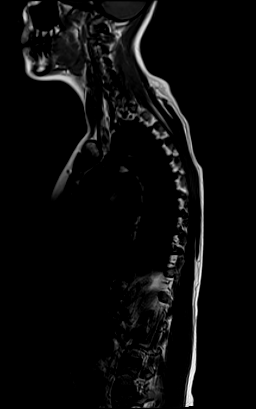
[im 9/9]
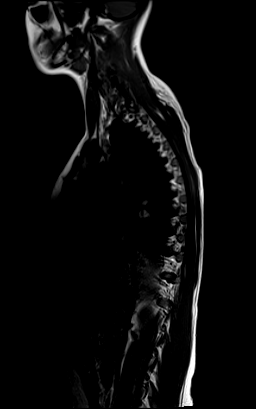

[Series 19: T2 · sagittal · 3.0mm · 0.71mm/px · 4 of 17 slices shown (1 of 2)]
[im 1/17]
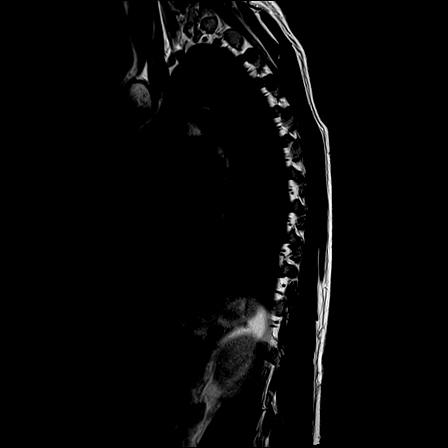
[im 6/17]
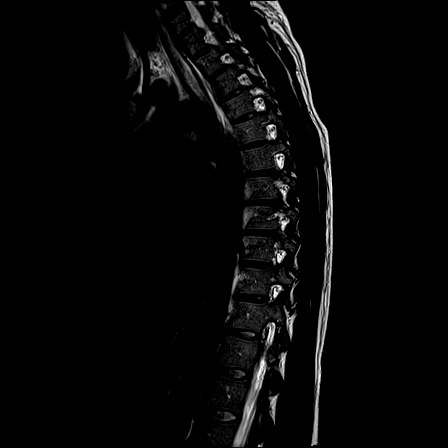
[im 11/17]
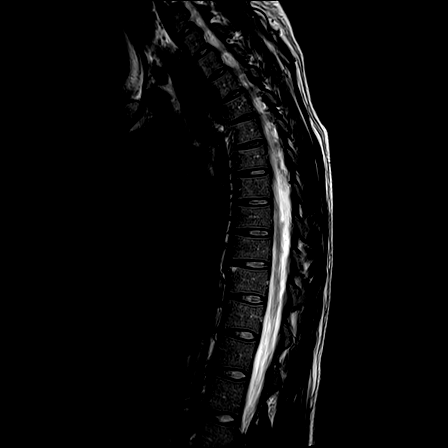
[im 17/17]
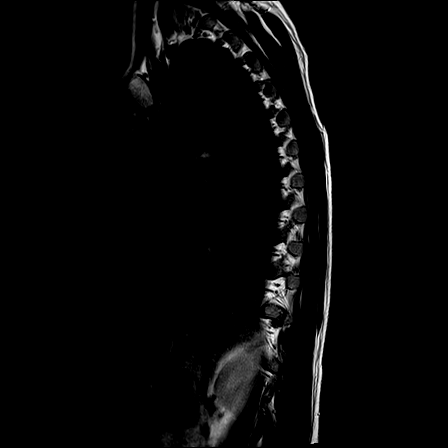

[Series 20: T1 · sagittal · 3.0mm · 0.71mm/px · 4 of 17 slices shown (4 of 5)]
[im 1/17]
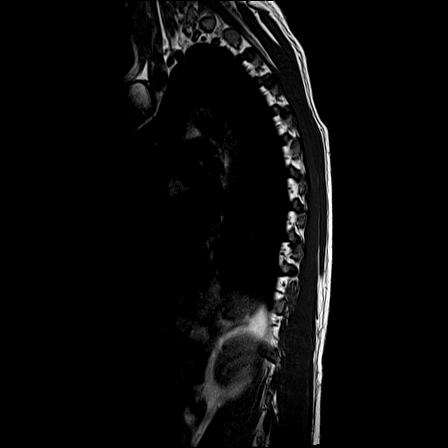
[im 6/17]
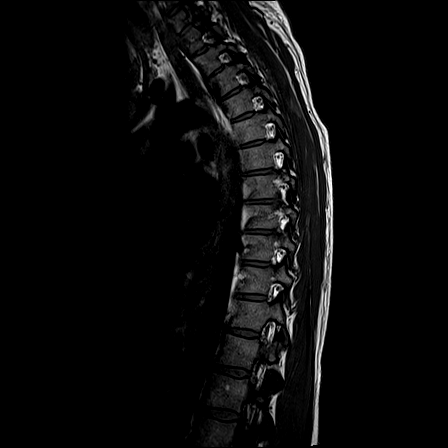
[im 11/17]
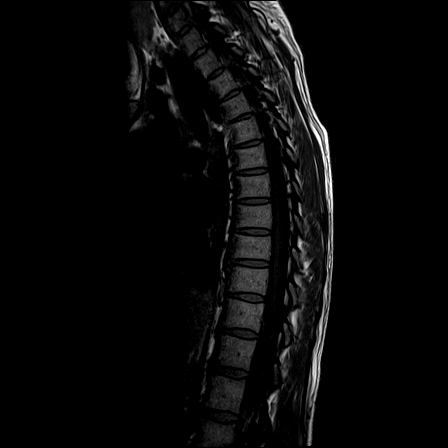
[im 17/17]
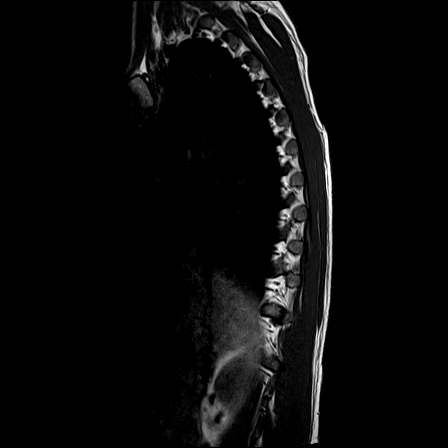

[Series 22: T2 · axial · 4.0mm · 0.59mm/px · z∈[-436,-214]mm · 8 of 39 slices shown (2 of 2)]
[im 1/39]
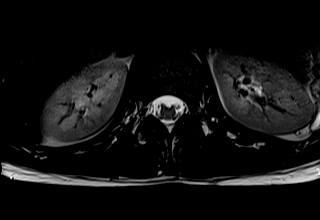
[im 5/39]
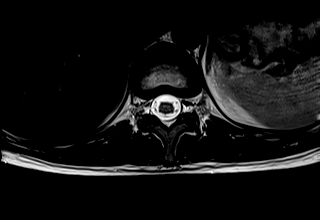
[im 13/39]
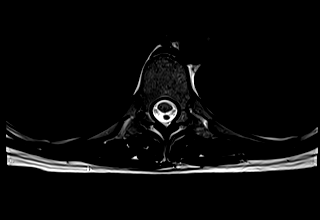
[im 17/39]
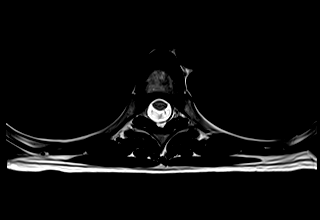
[im 22/39]
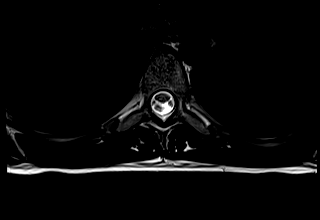
[im 26/39]
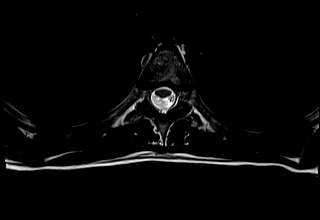
[im 34/39]
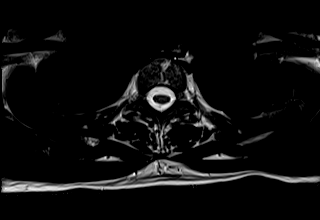
[im 39/39]
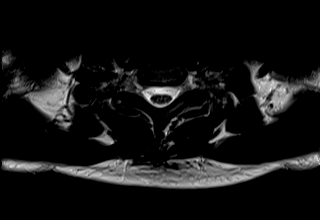

[Series 24: T1 · axial · non-contrast · 4.0mm · 0.31mm/px · z∈[-436,-214]mm · 8 of 39 slices shown (5 of 5)]
[im 1/39]
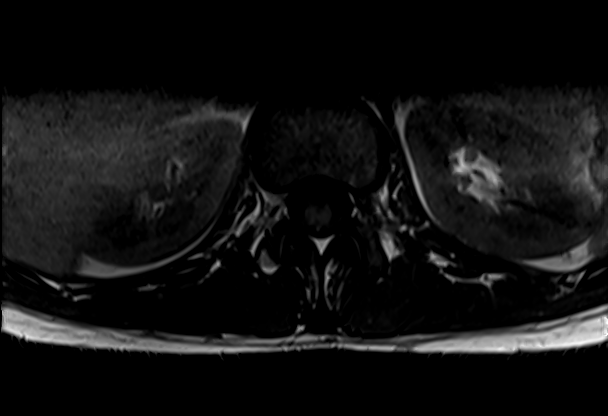
[im 5/39]
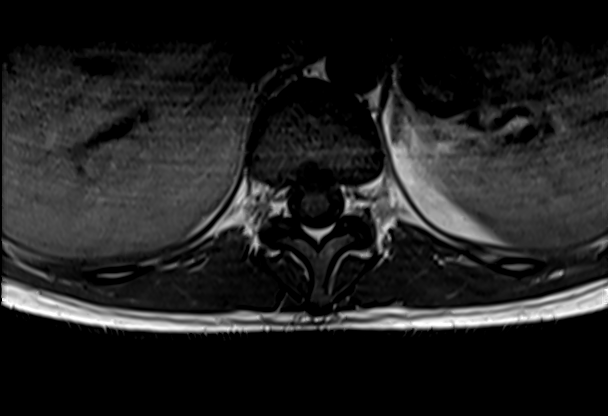
[im 13/39]
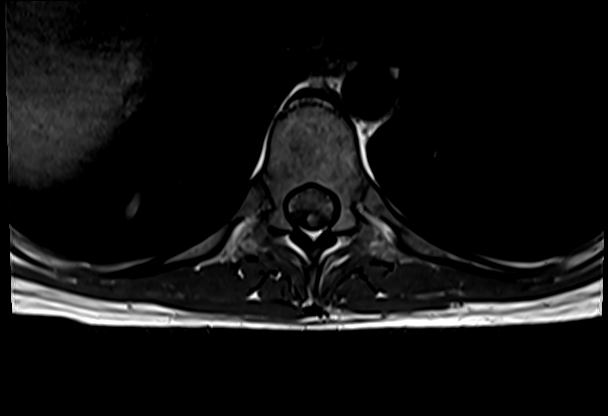
[im 17/39]
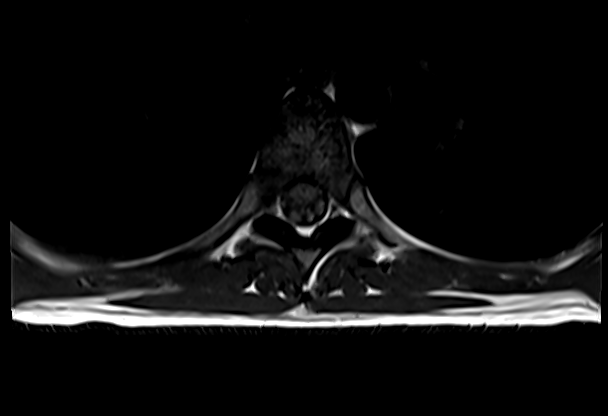
[im 22/39]
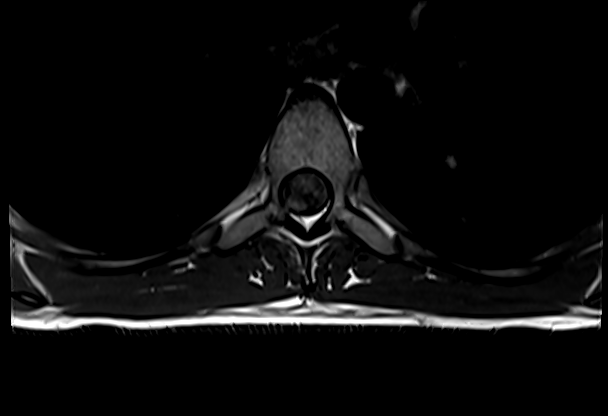
[im 26/39]
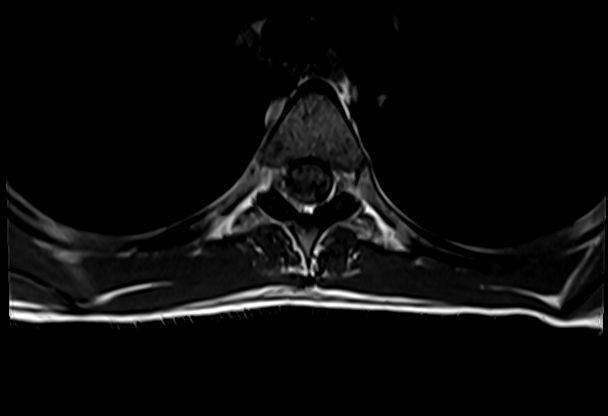
[im 34/39]
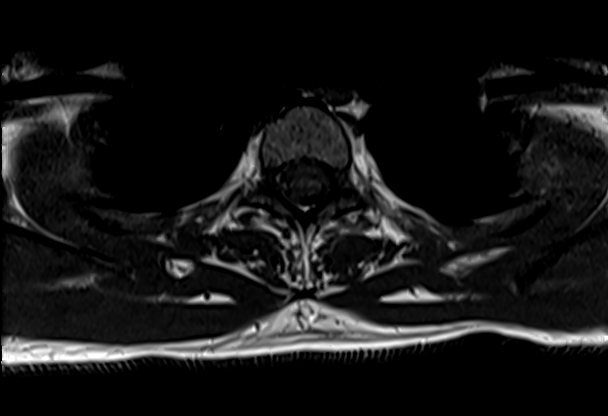
[im 39/39]
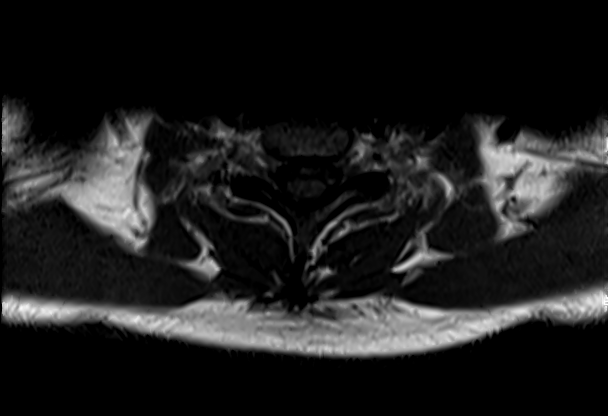

[30 of 48 positions shown; findings below may reference images not displayed]

FINDINGS: Intermittently motion degraded examination. Most notably, there is
moderate motion degradation of the axial T2 GRE sequence.

Alignment:  No significant spondylolisthesis.

Vertebrae: Vertebral body height is maintained. 5 mm T2/STIR
hyperintense focus within the anterior T2 vertebral body (series 21,
image 9). Foci of T2/STIR hyperintensity within the left T3 and T4
pedicles/articular pillars measuring up to 13 mm (series 21, image
12). Minimal edema signal along the T10 superior endplate anteriorly
appears degenerative.

Cord: No signal abnormality is identified within the thoracic spinal
cord.

Paraspinal and other soft tissues: No abnormality identified within
included portions of the abdomen/retroperitoneum.

Disc levels:

Intervertebral disc height is maintained throughout the thoracic
spine. No significant disc herniation or spinal canal stenosis
within the thoracic spine. No significant foraminal stenosis.
IMPRESSION: Intermittently motion degraded exam, as described.

5 mm T2/STIR hyperintense focus within the anterior T2 vertebral
body. Additionally, there are foci of T2/STIR hyperintensity within
the left T3 and T4 pedicles/articular pillars measuring up to 13 mm.
These foci are nonspecific and may reflect benign lesions such as
atypical hemangiomas. Alternative etiologies (such as osseous
metastatic disease) cannot be definitively excluded. 6-8 week MRI
follow-up recommended to ensure stability.

No significant disc herniation, spinal canal stenosis or neural
foraminal narrowing within the thoracic spine.

No spinal cord signal abnormality identified.

## 2021-03-28 IMAGING — MR MR CERVICAL SPINE W/O CM
4 of 6 series · 31 of 48 positions shown · non-contrast
Comparison: CT cervical spine [DATE].

CLINICAL DATA: Transient diffuse numbness, weakness, severe pain in
the head, neck and back in the context of recent discovery of
abdominal mass.

EXAM:
MRI CERVICAL SPINE WITHOUT CONTRAST
TECHNIQUE: Multiplanar, multisequence MR imaging of the cervical spine was
performed. No intravenous contrast was administered.

[Series 5: T2 · sagittal · 3.0mm · 0.69mm/px · 5 of 15 slices shown (1 of 2)]
[im 1/15]
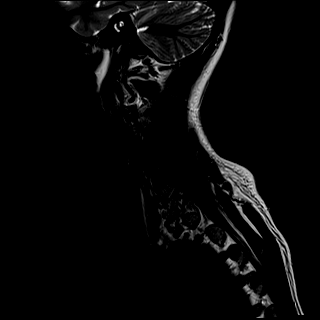
[im 4/15]
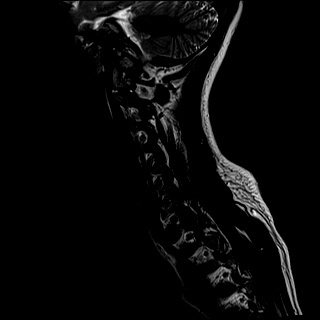
[im 8/15]
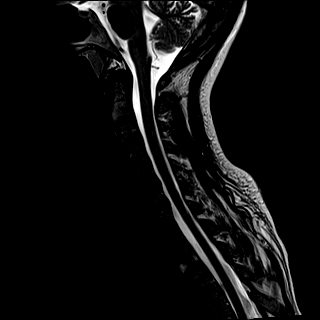
[im 11/15]
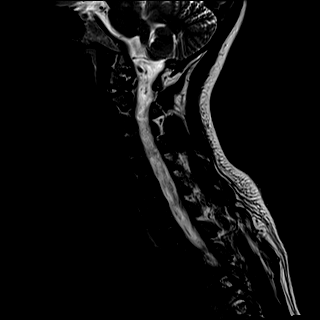
[im 15/15]
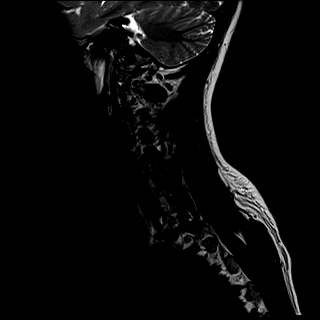

[Series 6: T1 · sagittal · 3.0mm · 0.69mm/px · 5 of 15 slices shown (1 of 2)]
[im 1/15]
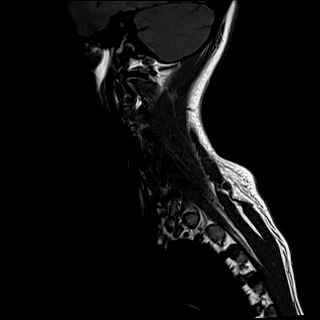
[im 4/15]
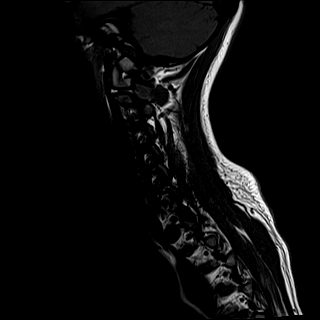
[im 8/15]
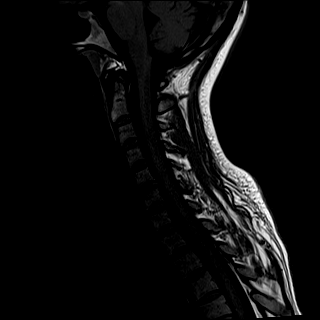
[im 11/15]
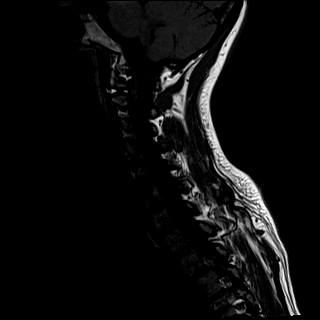
[im 15/15]
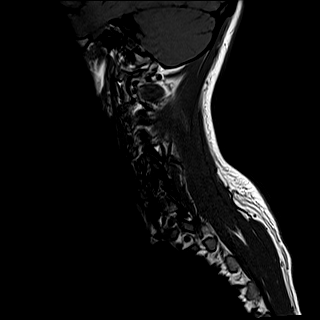

[Series 8: T2 · axial · 3.0mm · 0.66mm/px · z∈[-225,-110]mm · 11 of 38 slices shown (2 of 2)]
[im 1/38]
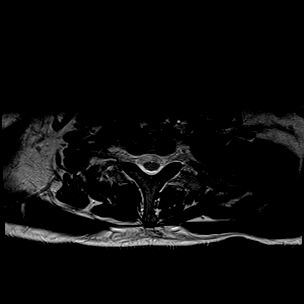
[im 4/38]
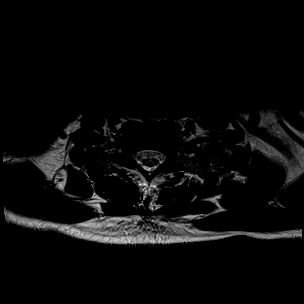
[im 8/38]
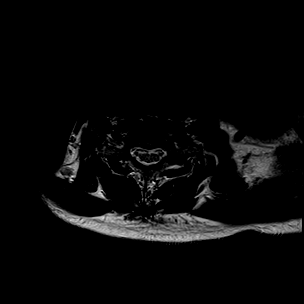
[im 12/38]
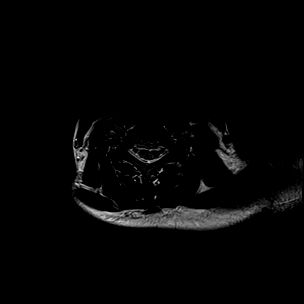
[im 15/38]
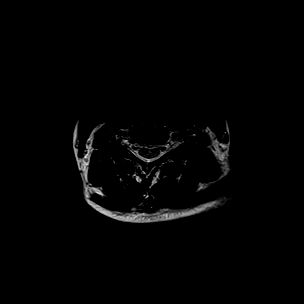
[im 19/38]
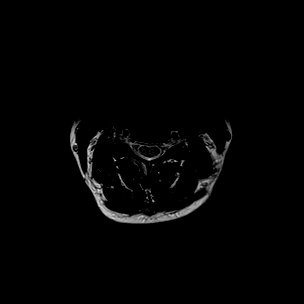
[im 23/38]
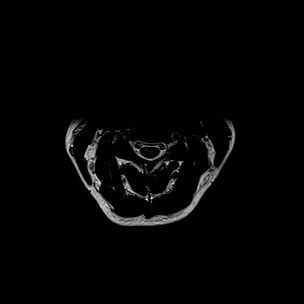
[im 26/38]
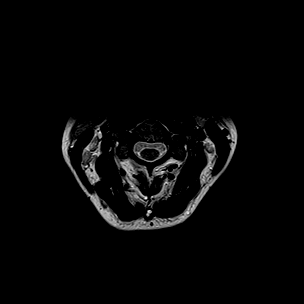
[im 30/38]
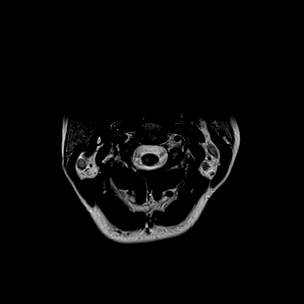
[im 34/38]
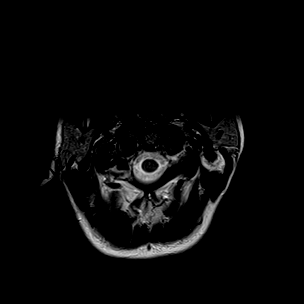
[im 38/38]
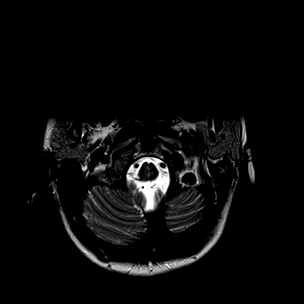

[Series 10: T1 · axial · 3.0mm · 0.39mm/px · z∈[-225,-123]mm · 10 of 38 slices shown (2 of 2)]
[im 1/38]
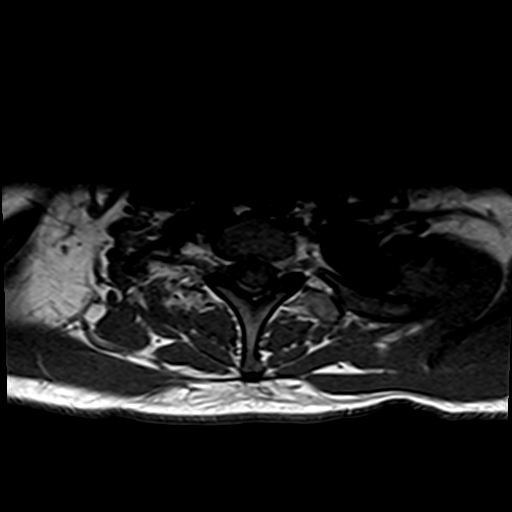
[im 4/38]
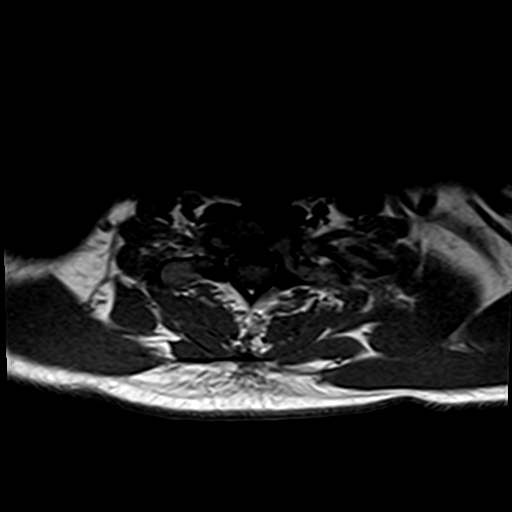
[im 8/38]
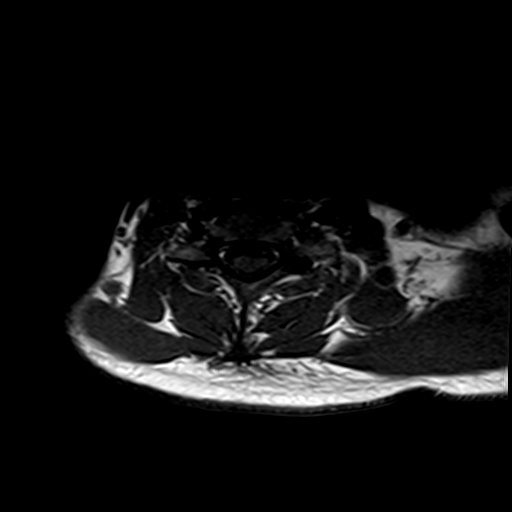
[im 12/38]
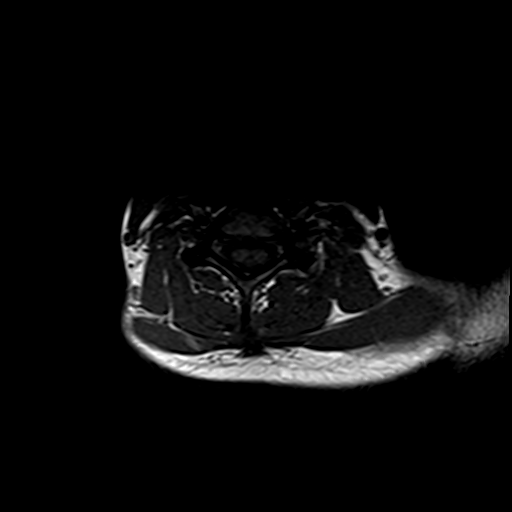
[im 15/38]
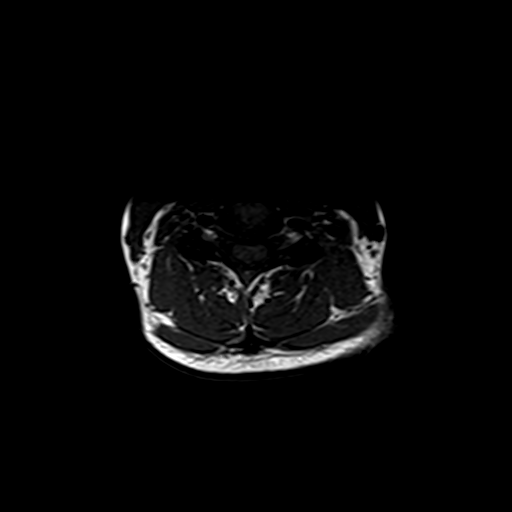
[im 19/38]
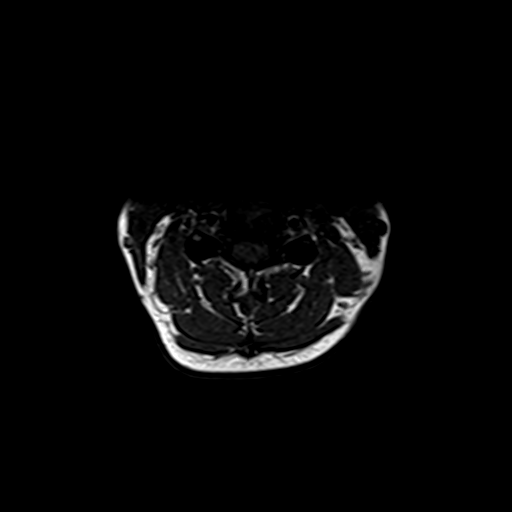
[im 23/38]
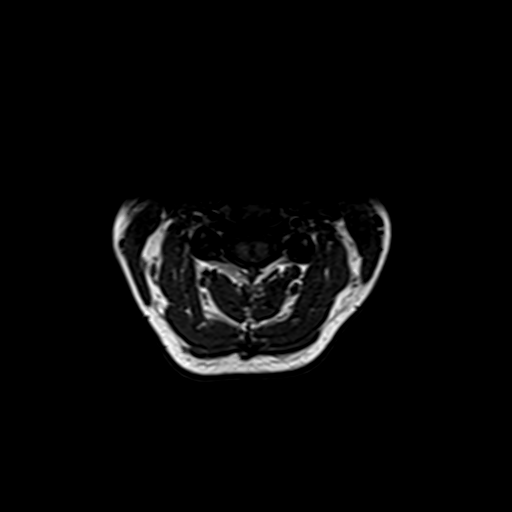
[im 26/38]
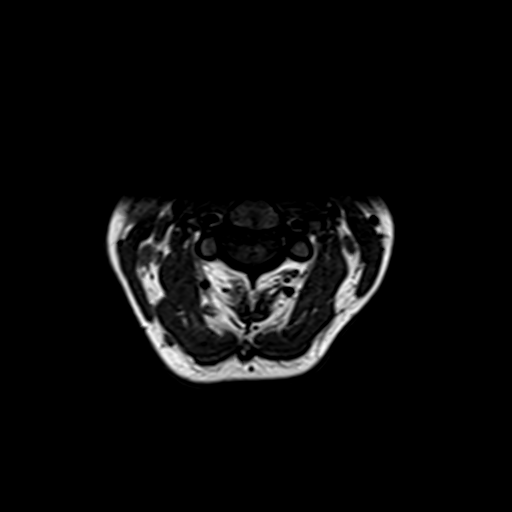
[im 30/38]
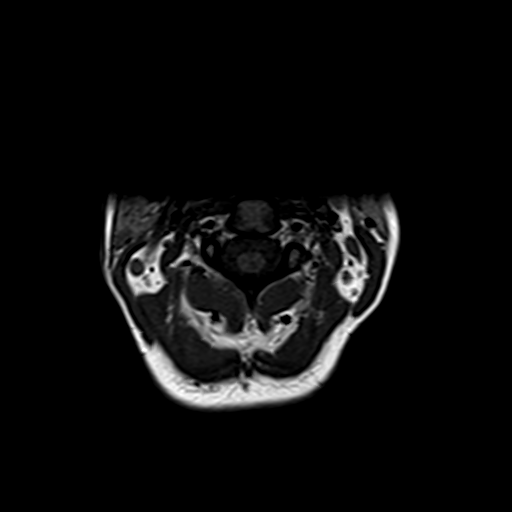
[im 34/38]
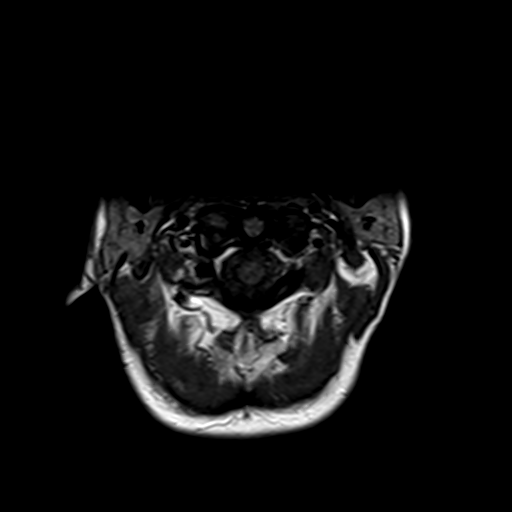

[31 of 48 positions shown; findings below may reference images not displayed]

FINDINGS: Alignment: Straightening of the expected cervical lordosis. No
significant spondylolisthesis.

Vertebrae: No significant marrow edema or focal suspicious osseous
lesion within the cervical spine.

Cord: No spinal cord signal abnormality.

Posterior Fossa, vertebral arteries, paraspinal tissues: Posterior
fossa better assessed on concurrently performed brain MRI. Flow
voids preserved within the imaged cervical vertebral arteries.
Paraspinal soft tissues within normal limits.

Disc levels:

No more than mild disc degeneration at any level.

C2-C3: No significant disc herniation or stenosis.

C3-C4: No significant disc herniation or stenosis.

C4-C5: No significant disc herniation or stenosis.

C5-C6: Small central disc protrusion. Mild partial effacement of the
ventral thecal sac without spinal cord mass effect. No significant
foraminal stenosis.

C6-C7: Small central disc protrusion. Mild partial effacement of the
ventral thecal sac without spinal cord mass effect. Minimal facet
arthrosis. No significant foraminal stenosis.

C7-T1: No significant disc herniation or stenosis.
IMPRESSION: No cervical spinal cord signal abnormality is identified.

Cervical spondylosis, as outlined and with findings most notably as
follows.

At C5-C6, there is mild disc degeneration. Small central disc
protrusion. Mild partial effacement of the ventral thecal sac
without spinal cord mass effect. No significant foraminal stenosis.

At C6-C7, there is mild disc degeneration. Small central disc
protrusion. Mild partial effacement of the ventral thecal sac
without spinal cord mass effect. Minimal facet arthrosis. No
significant foraminal stenosis.

## 2021-03-28 MED ORDER — GADOBUTROL 1 MMOL/ML IV SOLN: 7.5000 mL | Freq: Once | INTRAVENOUS | Status: DC | PRN

## 2021-03-28 NOTE — ED Provider Notes (Signed)
Cristina Davenport is a 35 y.o. female, presenting to the ED with headaches, back pain, numbness, weakness.   From Antony Blackbird, MD: "Cristina Davenport is a 35 y.o. female.  The history is provided by the patient, the spouse and medical records. No language interpreter was used.  Neurologic Problem This is a new problem. The current episode started more than 2 days ago. The problem occurs daily. The problem has not changed since onset.Associated symptoms include headaches. Pertinent negatives include no chest pain, no abdominal pain and no shortness of breath. Nothing aggravates the symptoms. Nothing relieves the symptoms. She has tried nothing for the symptoms. The treatment provided no relief.    Cristina Davenport is a 35 y.o. female with a past medical history significant for migraines, ADHD, anxiety, GERD, bipolar 1, hypertension,, and recent discovery of abdominal mass who presents with worsening headaches, neck pain, upper back pain, as well as waxing and waning diffuse body numbness, diffuse body weakness, lightheadedness, dizziness, and fatigue.  Patient reports that several days ago she had a CT scan for chronic abdominal pain that revealed a mass on the abdominal wall.  This was concerning for liposarcoma.  She is scheduled to follow-up as an outpatient for further work-up to determine the symptoms but after speaking with someone about this worsening back pain neck pain and headache with the neurologic deficits transiently, she decided to come to the emergency room for evaluation.  She reports they were going to get MRIs of her back to further evaluate.  She reports the headaches come and go but are severe at times.  She reports some nausea but no vomiting now.  She denies any diplopia but does report some blurry vision when the headaches are severe.  They are pressure all over her head she reports.  She denies any fevers, chills, chest pain, shortness of breath, constipation, or diarrhea.  She denies  any new urinary symptoms.  She denies any blood in her stools.  No recent COVID exposures or COVID symptoms.  She is very concerned about this mass and if it is spread to her back.  On exam, lungs are clear and chest is nontender.  Abdomen is nontender.  She does have a palpable density in her right mid abdomen near the umbilicus.  Otherwise she had intact sensation and strength in extremities.  Normal sensation and strength of the face.  Symmetric smile.  Clear speech.  Pupils symmetric and reactive normal extraocular movements.  She had pain in her mid back neck and head that was not exquisitely tender.  She possibly had some mildly positive Hoffmann sign on her left hand compared to right but patient also was very jumpy overall.  Otherwise no focal neurologic deficits.  Spoke with neurology about further work-up, given this possible abdominal mass and the waxing and waning neurologic symptoms and the new pain in her neck, head, and back, neurology does feel is reasonable to get MRIs with and without contrast of the brain, C-spine, and T-spine to rule out metastasis or other acute abnormality.  Patient had some screening labs in triage which were overall reassuring.  Anticipate reassessment after imaging to determine disposition."  Physical Exam  BP 123/80   Pulse 81   Temp 98.1 F (36.7 C)   Resp 18   Ht 5\' 1"  (1.549 m)   Wt 59 kg   LMP  (LMP Unknown)   SpO2 98%   BMI 24.56 kg/m   Physical Exam I did not  examine this patient. ED Course/Procedures     Procedures   Abnormal Labs Reviewed  COMPREHENSIVE METABOLIC PANEL - Abnormal; Notable for the following components:      Result Value   Glucose, Bld 100 (*)    All other components within normal limits  URINALYSIS, ROUTINE W REFLEX MICROSCOPIC - Abnormal; Notable for the following components:   Color, Urine STRAW (*)    All other components within normal limits   CT ABDOMEN PELVIS WO CONTRAST  Result Date:  03/26/2021 CLINICAL DATA:  Epigastric pain. upper abd pain states has a "bulge" to same area for 5 years. Pt states abd pain radiates through to back. EXAM: CT ABDOMEN AND PELVIS WITHOUT CONTRAST TECHNIQUE: Multidetector CT imaging of the abdomen and pelvis was performed following the standard protocol without IV contrast. COMPARISON:  CT abdomen pelvis 03/28/2006 FINDINGS: Lower chest: No acute abnormality. Hepatobiliary: No focal liver abnormality is seen. Status post cholecystectomy. No biliary dilatation. Pancreas: No focal lesion. Normal pancreatic contour. No surrounding inflammatory changes. No main pancreatic ductal dilatation. Spleen: Normal in size without focal abnormality. Adrenals/Urinary Tract: No adrenal nodule bilaterally. No nephrolithiasis, no hydronephrosis, and no contour-deforming renal mass. No ureterolithiasis or hydroureter. Circumferential urinary bladder wall thickening likely due to under distension. Stomach/Bowel: Stomach is within normal limits. No evidence of bowel wall thickening or dilatation. Cecal diverticula. Appendix appears normal. Vascular/Lymphatic: No significant vascular findings are present. No enlarged abdominal or pelvic lymph nodes. Reproductive: The uterus is retroverted. T-shaped intrauterine device is noted in grossly appropriate position. Uterus and bilateral adnexa are unremarkable. Other: No intraperitoneal free fluid. No intraperitoneal free gas. No organized fluid collection. Musculoskeletal: Interval development of a 3.4 x 1.1 cm fat density lesion within the subcutaneus soft tissues of the supraumbilical abdominal wall slightly to the right of midline (2:38). This region correlates to a prior imaged region in 2017. Intralesional slight fat stranding noted. No acute or significant osseous findings. IMPRESSION: 1. Interval development of a slightly complex 3.4 x 1.1 cm lipomatous lesion within the anterior abdominal soft tissues slightly to the right of midline.  This region of the soft tissues was previously evaluated on CT abdomen pelvis in 2017 at which time no lipomatous lesion is identified. Recommend nonemergent MRI for further evaluation of this lesion to exclude a liposarcoma. 2. Cecal diverticula with no acute diverticulitis. Electronically Signed   By: Iven Finn M.D.   On: 03/26/2021 01:08   MR BRAIN WO CONTRAST  Result Date: 03/28/2021 CLINICAL DATA:  Neuro deficit, acute, stroke suspected; transient diffuse numbness, weakness, and severe pain in head, neck and back in the context of recent discovery of abdominal mass. EXAM: MRI HEAD WITHOUT CONTRAST TECHNIQUE: Multiplanar, multiecho pulse sequences of the brain and surrounding structures were obtained without intravenous contrast. COMPARISON:  Prior head CT 12/23/2011. FINDINGS: Brain: Cerebral volume is normal. No cortical encephalomalacia is identified. No significant white matter disease. There is no acute infarct. No evidence of intracranial mass. No chronic intracranial blood products. No extra-axial fluid collection. No midline shift. Vascular: Expected proximal arterial flow voids. Skull and upper cervical spine: No focal marrow lesion. Sinuses/Orbits: Visualized orbits show no acute finding. Moderate bilateral ethmoid sinus mucosal thickening. Trace bilateral maxillary sinus mucosal thickening. IMPRESSION: Unremarkable non-contrast MRI appearance of the brain. No evidence of acute intracranial abnormality. Paranasal sinus disease, as described. Electronically Signed   By: Kellie Simmering DO   On: 03/28/2021 20:37   MR CERVICAL SPINE WO CONTRAST  Result Date: 03/28/2021 CLINICAL DATA:  Transient diffuse numbness, weakness, severe pain in the head, neck and back in the context of recent discovery of abdominal mass. EXAM: MRI CERVICAL SPINE WITHOUT CONTRAST TECHNIQUE: Multiplanar, multisequence MR imaging of the cervical spine was performed. No intravenous contrast was administered. COMPARISON:   CT cervical spine 01/10/2014. FINDINGS: Alignment: Straightening of the expected cervical lordosis. No significant spondylolisthesis. Vertebrae: No significant marrow edema or focal suspicious osseous lesion within the cervical spine. Cord: No spinal cord signal abnormality. Posterior Fossa, vertebral arteries, paraspinal tissues: Posterior fossa better assessed on concurrently performed brain MRI. Flow voids preserved within the imaged cervical vertebral arteries. Paraspinal soft tissues within normal limits. Disc levels: No more than mild disc degeneration at any level. C2-C3: No significant disc herniation or stenosis. C3-C4: No significant disc herniation or stenosis. C4-C5: No significant disc herniation or stenosis. C5-C6: Small central disc protrusion. Mild partial effacement of the ventral thecal sac without spinal cord mass effect. No significant foraminal stenosis. C6-C7: Small central disc protrusion. Mild partial effacement of the ventral thecal sac without spinal cord mass effect. Minimal facet arthrosis. No significant foraminal stenosis. C7-T1: No significant disc herniation or stenosis. IMPRESSION: No cervical spinal cord signal abnormality is identified. Cervical spondylosis, as outlined and with findings most notably as follows. At C5-C6, there is mild disc degeneration. Small central disc protrusion. Mild partial effacement of the ventral thecal sac without spinal cord mass effect. No significant foraminal stenosis. At C6-C7, there is mild disc degeneration. Small central disc protrusion. Mild partial effacement of the ventral thecal sac without spinal cord mass effect. Minimal facet arthrosis. No significant foraminal stenosis. Electronically Signed   By: Kellie Simmering DO   On: 03/28/2021 20:44   MR THORACIC SPINE WO CONTRAST  Result Date: 03/28/2021 CLINICAL DATA:  Mid back pain; transient diffuse numbness, weakness, severe pain in head, neck and back in the context of recent discovery of  abdominal mass. EXAM: MRI THORACIC SPINE WITHOUT CONTRAST TECHNIQUE: Multiplanar, multisequence MR imaging of the thoracic spine was performed. No intravenous contrast was administered. COMPARISON:  CT chest 07/19/2013. CT abdomen/pelvis FINDINGS: Intermittently motion degraded examination. Most notably, there is moderate motion degradation of the axial T2 GRE sequence. Alignment:  No significant spondylolisthesis. Vertebrae: Vertebral body height is maintained. 5 mm T2/STIR hyperintense focus within the anterior T2 vertebral body (series 21, image 9). Foci of T2/STIR hyperintensity within the left T3 and T4 pedicles/articular pillars measuring up to 13 mm (series 21, image 12). Minimal edema signal along the T10 superior endplate anteriorly appears degenerative. Cord: No signal abnormality is identified within the thoracic spinal cord. Paraspinal and other soft tissues: No abnormality identified within included portions of the abdomen/retroperitoneum. Disc levels: Intervertebral disc height is maintained throughout the thoracic spine. No significant disc herniation or spinal canal stenosis within the thoracic spine. No significant foraminal stenosis. IMPRESSION: Intermittently motion degraded exam, as described. 5 mm T2/STIR hyperintense focus within the anterior T2 vertebral body. Additionally, there are foci of T2/STIR hyperintensity within the left T3 and T4 pedicles/articular pillars measuring up to 13 mm. These foci are nonspecific and may reflect benign lesions such as atypical hemangiomas. Alternative etiologies (such as osseous metastatic disease) cannot be definitively excluded. 6-8 week MRI follow-up recommended to ensure stability. No significant disc herniation, spinal canal stenosis or neural foraminal narrowing within the thoracic spine. No spinal cord signal abnormality identified. Electronically Signed   By: Kellie Simmering DO   On: 03/28/2021 20:57    MDM   Clinical Course as of  03/28/21 2337   Sun Mar 28, 2021  2255 Spoke with the patient by phone.  She apologized for leaving the department and stated she could wait no longer as her children were with older family members and she needed to get home. I asked her about what happened with the contrasted MRIs that were ordered.  She states due to pain in her back while lying supine, she was unable to tolerate further MRIs.  She states she is more than willing to have these MRIs done, she just needed a break.  I discussed the findings on MRI today as well as the recommended specialties with which to follow.  She adds she was able to make the recommended follow-up appointment with general surgery and has an appointment scheduled for Tuesday, May 17.  I encouraged her to keep this appointment.  I offered prescriptions for pain and symptom management, such as muscle relaxer, but patient declined.  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 [SJ]    Clinical Course User Index [SJ] Lorayne Bender, PA-C    Patient care handoff report received from Antony Blackbird, MD. Plan: MRI is pending.  Disposition according to findings.  Dr. Sherry Ruffing spoke with Dr. Lorrin Goodell, neurologist, for advice on imaging study choice.  Dr. Sherry Ruffing also mentioned he ordered the MRIs with and without contrast.  However, they were performed without contrast only.  And the order change information it states the patient refused the contrasted studies.  I did not get a chance to speak with the patient about this.  Once the MRIs resulted, I intended to speak with neurology again regarding the findings.  In the meantime, I was going to speak with the patient to get an idea of what happened with the contrasted studies. I was then informed the patient had left the department and the nurse marked the patient's disposition as leaving [facility].  She reportedly told  nursing staff she was unable to wait any longer and left the department under her own power and reportedly with steady gait.  After speaking with the patient by phone, I was able to obtain further information. She is very interested in her care and evaluation of these issues, she just had family matters to attend to.   I discussed this patient and how to proceed with Lavenia Atlas, MD after EDP shift change.  We reviewed the patient's reported symptoms as well as the imaging studies. I placed ambulatory referral orders for both neurosurgery and oncology.  I sent a message through the epic messaging system to the patient's PCP and routed her CT abdomen pelvis from 5/12 as well as her MRIs from this evening to the PCP for his review. I also asked if he would be able to help the patient navigate her follow-up appointments and if he would be able to facilitate obtaining the remaining recommended MRIs which would include: MR brain with contrast, cervical spine with contrast, thoracic spine with contrast, and lumbar spine with and without contrast.         Lorayne Bender, PA-C 03/28/21 2337    Tegeler, Gwenyth Allegra, MD 03/31/21 [phone]

## 2021-03-28 NOTE — ED Provider Notes (Signed)
Emergency Medicine Provider Triage Evaluation Note  TESSIE ORDAZ , a 35 y.o. female  was evaluated in triage.  Pt complains of tingling and hot sensation right side  Review of Systems  Positive: weakness Negative: No fever  Physical Exam  BP (!) 133/93 (BP Location: Left Arm)   Pulse 90   Temp 98.1 F (36.7 C)   Resp 16   Ht 5\' 1"  (1.549 m)   Wt 59 kg   LMP  (LMP Unknown)   SpO2 100%   BMI 24.56 kg/m  Gen:   Awake, no distress   Resp:  Normal effort  MSK:   Moves extremities without difficulty  Other:    Medical Decision Making  Medically screening exam initiated at 11:24 AM.  Appropriate orders placed.  NAYDELINE MORACE was informed that the remainder of the evaluation will be completed by another provider, this initial triage assessment does not replace that evaluation, and the importance of remaining in the ED until their evaluation is complete.     Fransico Meadow, [place] 03/28/21 1125    Tegeler, Gwenyth Allegra, MD 03/28/21 2036

## 2021-03-28 NOTE — ED Notes (Signed)
Called for vitals x2 no response. 

## 2021-03-28 NOTE — ED Notes (Signed)
PT was very rude and verbally abusive. Pt left AMA

## 2021-03-28 NOTE — ED Provider Notes (Signed)
[facility] EMERGENCY DEPARTMENT Provider Note   CSN: [id number] Arrival date & time: 03/28/21  1103     History No chief complaint on file.   Cristina Davenport is a 35 y.o. female.  The history is provided by the patient, the spouse and medical records. No language interpreter was used.  Neurologic Problem This is a new problem. The current episode started more than 2 days ago. The problem occurs daily. The problem has not changed since onset.Associated symptoms include headaches. Pertinent negatives include no chest pain, no abdominal pain and no shortness of breath. Nothing aggravates the symptoms. Nothing relieves the symptoms. She has tried nothing for the symptoms. The treatment provided no relief.       Past Medical History:  Diagnosis Date  . Anxiety   . Anxiety   . Bipolar 1 disorder ([place])   . GERD (gastroesophageal reflux disease)   . Hypertension    PIH  . IBS (irritable bowel syndrome)   . Migraine without aura     Patient Active Problem List   Diagnosis Date Noted  . Attention deficit hyperactivity disorder (ADHD), combined type 03/20/2020  . Encounter for IUD insertion 02/24/2020  . Genital warts 06/21/2019  . HSV-1 infection 05/10/2019  . Post partum depression 01/05/2018  . Hx of preeclampsia, prior pregnancy, currently pregnant 02/16/2017  . Abdominal bloating 03/14/2016  . Migraine headache 03/12/2016  . Anxiety 04/17/2015  . Low grade squamous intraepithelial lesion on cytologic smear of cervix (LGSIL) 09/15/2014  . Adjustment disorder with anxious mood 09/14/2014    Past Surgical History:  Procedure Laterality Date  . ABDOMINAL SURGERY     cyst removed   . CHOLECYSTECTOMY N/A 07/10/2013   Procedure: LAPAROSCOPIC CHOLECYSTECTOMY;  Surgeon: Donato Heinz, MD;  Location: [facility];  Service: General;  Laterality: N/A;  . ESOPHAGOGASTRODUODENOSCOPY N/A 04/30/2013   XNA:TFTDDUKG distal esophagus of uncertain clinical  significance-status post biopsy. Tiny hiatal hernia. No explanation  . EXAMINATION UNDER ANESTHESIA  04/21/2012   Gluteal wound repair  . WISDOM TOOTH EXTRACTION       OB History    Gravida  4   Para  3   Term  2   Preterm  1   AB  1   Living  3     SAB  1   IAB  0   Ectopic  0   Multiple  0   Live Births  3           Family History  Adopted: Yes  Problem Relation Age of Onset  . Mental illness Mother   . Cancer Father        skin cancer  . Hypertension Maternal Grandmother   . Stroke Maternal Grandmother   . Kidney failure Maternal Grandmother   . Hypertension Maternal Grandfather   . Stroke Maternal Grandfather   . Cancer Maternal Grandfather        skin  . Crohn's disease Cousin   . Colon cancer Neg Hx     Social History   Tobacco Use  . Smoking status: Former Smoker    Packs/day: 0.00    Years: 8.00    Pack years: 0.00    Types: Cigarettes    Quit date: 09/19/2014    Years since quitting: 6.5  . Smokeless tobacco: Never Used  Vaping Use  . Vaping Use: Every day  Substance Use Topics  . Alcohol use: Yes    Alcohol/week: 3.0 standard drinks  Types: 3 Glasses of wine per week  . Drug use: No    Home Medications Prior to Admission medications   Medication Sig Start Date End Date Taking? Authorizing Provider  amphetamine-dextroamphetamine (ADDERALL XR) 20 MG 24 hr capsule Take 1 capsule (20 mg total) by mouth every morning. 02/19/21   Mozingo, Berdie Ogren, NP  amphetamine-dextroamphetamine (ADDERALL XR) 20 MG 24 hr capsule Take 1 capsule (20 mg total) by mouth daily. 03/19/21   Mozingo, Berdie Ogren, NP  amphetamine-dextroamphetamine (ADDERALL XR) 20 MG 24 hr capsule Take 1 capsule (20 mg total) by mouth daily. 04/16/21   Mozingo, Berdie Ogren, NP  amphetamine-dextroamphetamine (ADDERALL) 10 MG tablet Take 1 tablet (10 mg total) by mouth daily. 02/19/21   Mozingo, Berdie Ogren, NP  amphetamine-dextroamphetamine (ADDERALL) 10 MG tablet  Take 1 tablet (10 mg total) by mouth daily. 03/19/21   Mozingo, Berdie Ogren, NP  amphetamine-dextroamphetamine (ADDERALL) 10 MG tablet Take 1 tablet (10 mg total) by mouth daily. 04/16/21   Mozingo, Berdie Ogren, NP  Cholecalciferol (VITAMIN D3) 50 MCG (2000 UT) TABS Take 2,000 mg by mouth daily.    [provider]  Cyanocobalamin (B12 FAST DISSOLVE) 5000 MCG TBDP Take 1 tablet by mouth daily.    [provider]  Ferrous Sulfate (IRON) 325 (65 Fe) MG TABS Take 1 tablet by mouth daily.    [provider]  hydrOXYzine (ATARAX/VISTARIL) 25 MG tablet TAKE 1 TABLET BY MOUTH AT BEDTIME AS NEEDED. 12/25/20   Nilda Simmer, NP  levonorgestrel (LILETTA, 52 MG,) 19.5 MCG/DAY IUD IUD 1 each by Intrauterine route once.    [provider]  Lysine 1000 MG TABS Take 1,000 mg by mouth daily.    [provider]  NP THYROID 60 MG tablet Take 1 tablet (60 mg total) by mouth daily before breakfast. 03/10/21   Hurshel Party C, MD  progesterone (PROMETRIUM) 200 MG capsule Take 1 capsule (200 mg total) by mouth daily. 01/19/21   Doree Albee, MD  sertraline (ZOLOFT) 100 MG tablet Take two tablets daily. 02/19/21   Mozingo, Berdie Ogren, NP  UNABLE TO FIND Med Name: Bio Cleanse takes 2 capsules every night    [provider]  valACYclovir (VALTREX) 1000 MG tablet Take 1,000 mg by mouth as needed.    [provider]    Allergies    Latex and Doxycycline  Review of Systems   Review of Systems  Constitutional: Positive for fatigue. Negative for chills, diaphoresis and fever.  HENT: Negative for congestion.   Eyes: Positive for visual disturbance (transient).  Respiratory: Negative for cough, chest tightness, shortness of breath and wheezing.   Cardiovascular: Negative for chest pain and palpitations.  Gastrointestinal: Negative for abdominal pain, constipation, diarrhea, nausea and vomiting.  Genitourinary: Negative for dysuria and flank pain.   Musculoskeletal: Positive for back pain and neck pain. Negative for neck stiffness.  Neurological: Positive for dizziness, weakness (transitnent), light-headedness, numbness (transient) and headaches. Negative for syncope and speech difficulty.  Psychiatric/Behavioral: Positive for confusion (transitnet). Negative for agitation.  All other systems reviewed and are negative.   Physical Exam Updated Vital Signs BP 106/63   Pulse 68   Temp 98.1 F (36.7 C)   Resp 18   Ht 5\' 1"  (1.549 m)   Wt 59 kg   LMP  (LMP Unknown)   SpO2 91%   BMI 24.56 kg/m   Physical Exam Vitals and nursing note reviewed.  Constitutional:      General: She is  not in acute distress.    Appearance: She is well-developed. She is not ill-appearing, toxic-appearing or diaphoretic.  HENT:     Head: Normocephalic and atraumatic.     Nose: No congestion or rhinorrhea.     Mouth/Throat:     Mouth: Mucous membranes are moist.     Pharynx: No oropharyngeal exudate or posterior oropharyngeal erythema.  Eyes:     Extraocular Movements: Extraocular movements intact.     Conjunctiva/sclera: Conjunctivae normal.     Pupils: Pupils are equal, round, and reactive to light.  Cardiovascular:     Rate and Rhythm: Regular rhythm. Tachycardia present.     Heart sounds: No murmur heard.   Pulmonary:     Effort: Pulmonary effort is normal. No respiratory distress.     Breath sounds: Normal breath sounds. No wheezing, rhonchi or rales.  Chest:     Chest wall: No tenderness.  Abdominal:     General: Abdomen is flat.     Palpations: Abdomen is soft.     Tenderness: There is no abdominal tenderness. There is no right CVA tenderness, left CVA tenderness, guarding or rebound.  Musculoskeletal:        General: Tenderness present.     Cervical back: Neck supple. Tenderness present.     Right lower leg: No edema.     Left lower leg: No edema.  Skin:    General: Skin is warm and dry.     Capillary Refill: Capillary refill  takes less than 2 seconds.     Findings: No erythema.  Neurological:     General: No focal deficit present.     Mental Status: She is alert and oriented to person, place, and time. Mental status is at baseline.     Cranial Nerves: No cranial nerve deficit.     Sensory: No sensory deficit.     Motor: No weakness.     Coordination: Coordination normal.  Psychiatric:        Mood and Affect: Mood is anxious.     ED Results / Procedures / Treatments   Labs (all labs ordered are listed, but only abnormal results are displayed) Labs Reviewed  COMPREHENSIVE METABOLIC PANEL - Abnormal; Notable for the following components:      Result Value   Glucose, Bld 100 (*)    All other components within normal limits  URINALYSIS, ROUTINE W REFLEX MICROSCOPIC - Abnormal; Notable for the following components:   Color, Urine STRAW (*)    All other components within normal limits  CBC WITH DIFFERENTIAL/PLATELET    EKG EKG Interpretation  Date/Time:  Sunday Mar 28 2021 11:14:38 EDT Ventricular Rate:  78 PR Interval:  130 QRS Duration: 72 QT Interval:  370 QTC Calculation: 421 R Axis:   81 Text Interpretation: Normal sinus rhythm Normal ECG when compared to prior, similar appearance. No STEMI Confirmed by Antony Blackbird [phone]) on 03/28/2021 3:28:58 PM   Radiology No results found.  Procedures Procedures   Medications Ordered in ED Medications  gadobutrol (GADAVIST) 1 MMOL/ML injection 7.5 mL (has no administration in time range)    ED Course  I have reviewed the triage vital signs and the nursing notes.  Pertinent labs & imaging results that were available during my care of the patient were reviewed by me and considered in my medical decision making (see chart for details).    MDM Rules/Calculators/A&P  Cristina Davenport is a 35 y.o. female with a past medical history significant for migraines, ADHD, anxiety, GERD, bipolar 1, hypertension,, and recent  discovery of abdominal mass who presents with worsening headaches, neck pain, upper back pain, as well as waxing and waning diffuse body numbness, diffuse body weakness, lightheadedness, dizziness, and fatigue.  Patient reports that several days ago she had a CT scan for chronic abdominal pain that revealed a mass on the abdominal wall.  This was concerning for liposarcoma.  She is scheduled to follow-up as an outpatient for further work-up to determine the symptoms but after speaking with someone about this worsening back pain neck pain and headache with the neurologic deficits transiently, she decided to come to the emergency room for evaluation.  She reports they were going to get MRIs of her back to further evaluate.  She reports the headaches come and go but are severe at times.  She reports some nausea but no vomiting now.  She denies any diplopia but does report some blurry vision when the headaches are severe.  They are pressure all over her head she reports.  She denies any fevers, chills, chest pain, shortness of breath, constipation, or diarrhea.  She denies any new urinary symptoms.  She denies any blood in her stools.  No recent COVID exposures or COVID symptoms.  She is very concerned about this mass and if it is spread to her back.  On exam, lungs are clear and chest is nontender.  Abdomen is nontender.  She does have a palpable density in her right mid abdomen near the umbilicus.  Otherwise she had intact sensation and strength in extremities.  Normal sensation and strength of the face.  Symmetric smile.  Clear speech.  Pupils symmetric and reactive normal extraocular movements.  She had pain in her mid back neck and head that was not exquisitely tender.  She possibly had some mildly positive Hoffmann sign on her left hand compared to right but patient also was very jumpy overall.  Otherwise no focal neurologic deficits.  Spoke with neurology about further work-up, given this possible abdominal  mass and the waxing and waning neurologic symptoms and the new pain in her neck, head, and back, neurology does feel is reasonable to get MRIs with and without contrast of the brain, C-spine, and T-spine to rule out metastasis or other acute abnormality.  Patient had some screening labs in triage which were overall reassuring.  Anticipate reassessment after imaging to determine disposition.    Final Clinical Impression(s) / ED Diagnoses Final diagnoses:  Acute nonintractable headache, unspecified headache type  Neck pain  Upper back pain  Numbness     Clinical Impression: 1. Acute nonintractable headache, unspecified headache type   2. Neck pain   3. Upper back pain   4. Numbness     Disposition: Care transferred to oncoming team awaiting for results of MRI and reassessment.  Anticipate discharge home if work-up is reassuring.  This note was prepared with assistance of Systems analyst. Occasional wrong-word or sound-a-like substitutions may have occurred due to the inherent limitations of voice recognition software.     Mackena Plummer, Gwenyth Allegra, MD 03/28/21 2035

## 2021-03-28 NOTE — ED Notes (Signed)
Pt states that she is leaving because she has been here since 11am and that she will check her results on her MyChart account

## 2021-03-28 NOTE — ED Notes (Signed)
Pt to mri 

## 2021-03-28 NOTE — ED Triage Notes (Addendum)
Pt arrives via POV with c/o numbness, tingling and pain in back, left shoulder and jaw. Began Thursday. Endorses nausea. Pt states she is losing track of her thought and gets "lost" Pt states she feels like she is getting worse since being seen at [facility] 5/12

## 2021-03-29 ENCOUNTER — Telehealth: Payer: Self-pay

## 2021-03-29 ENCOUNTER — Ambulatory Visit (INDEPENDENT_AMBULATORY_CARE_PROVIDER_SITE_OTHER): Payer: Medicaid Other | Admitting: Internal Medicine

## 2021-03-29 ENCOUNTER — Other Ambulatory Visit: Payer: Self-pay | Admitting: Adult Health

## 2021-03-29 ENCOUNTER — Telehealth (INDEPENDENT_AMBULATORY_CARE_PROVIDER_SITE_OTHER): Payer: Self-pay

## 2021-03-29 DIAGNOSIS — F331 Major depressive disorder, recurrent, moderate: Secondary | ICD-10-CM

## 2021-03-29 DIAGNOSIS — F411 Generalized anxiety disorder: Secondary | ICD-10-CM

## 2021-03-29 NOTE — Telephone Encounter (Signed)
Transition Care Management Follow-up Telephone Call  Date of discharge and from where: 03/28/2021 from [facility]  How have you been since you were released from the hospital? Pt stated that she is still feeling the same.   Any questions or concerns? No  Items Reviewed:  Did the pt receive and understand the discharge instructions provided? Yes   Medications obtained and verified? Yes   Other? No   Any new allergies since your discharge? No   Dietary orders reviewed? n/a  Do you have support at home? Yes   Functional Questionnaire: (I = Independent and D = Dependent) ADLs: I  Bathing/Dressing- I  Meal Prep- I  Eating- I  Maintaining continence- I  Transferring/Ambulation- I  Managing Meds- I   Follow up appointments reviewed:   PCP Hospital f/u appt confirmed? No    Specialist Hospital f/u appt confirmed? No    Are transportation arrangements needed? No   If their condition worsens, is the pt aware to call PCP or go to the Emergency Dept.? Yes  Was the patient provided with contact information for the PCP's office or ED? Yes  Was to pt encouraged to call back with questions or concerns? Yes

## 2021-03-29 NOTE — Telephone Encounter (Signed)
Okay, will follow with your instructions.

## 2021-03-29 NOTE — Telephone Encounter (Signed)
She has already had an MRI of the cervical and thoracic spine.  I would not currently order any further test.

## 2021-03-30 ENCOUNTER — Ambulatory Visit (INDEPENDENT_AMBULATORY_CARE_PROVIDER_SITE_OTHER): Payer: 59 | Admitting: General Surgery

## 2021-03-30 ENCOUNTER — Other Ambulatory Visit: Payer: Self-pay

## 2021-03-30 ENCOUNTER — Encounter: Payer: Self-pay | Admitting: General Surgery

## 2021-03-30 VITALS — BP 112/73 | HR 105 | Temp 99.1°F | Resp 12 | Ht 61.0 in | Wt 130.0 lb

## 2021-03-30 DIAGNOSIS — K431 Incisional hernia with gangrene: Secondary | ICD-10-CM

## 2021-03-30 NOTE — H&P (Signed)
Cristina Davenport; [id number]; 10/19/1986   HPI Patient is a 34-year-old white female who was referred to my care by Dr. Gosrani for evaluation and treatment of an abdominal wall mass.  Patient states is been present for many years.  She states that it is occasionally tender to touch.  She has multiple other complaints including peripheral neuropathy and headaches.  A recent CT scan of the abdomen revealed a soft tissue mass which was difficult to fully classify.  She also had other findings on MRI of the thoracic spine.  She is status post laparoscopic cholecystectomy in the past.  She has been told in the past that this could be an incisional hernia. Past Medical History:  Diagnosis Date  . Anxiety   . Anxiety   . Bipolar 1 disorder ([facility])   . GERD (gastroesophageal reflux disease)   . Hypertension    PIH  . IBS (irritable bowel syndrome)   . Migraine without aura     Past Surgical History:  Procedure Laterality Date  . ABDOMINAL SURGERY     cyst removed   . CHOLECYSTECTOMY N/A 07/10/2013   Procedure: LAPAROSCOPIC CHOLECYSTECTOMY;  Surgeon: Brent C Ziegler, MD;  Location: [facility];  Service: General;  Laterality: N/A;  . ESOPHAGOGASTRODUODENOSCOPY N/A 04/30/2013   RMR:Abnormal distal esophagus of uncertain clinical significance-status post biopsy. Tiny hiatal hernia. No explanation  . EXAMINATION UNDER ANESTHESIA  04/21/2012   Gluteal wound repair  . WISDOM TOOTH EXTRACTION      Family History  Adopted: Yes  Problem Relation Age of Onset  . Mental illness Mother   . Cancer Father        skin cancer  . Hypertension Maternal Grandmother   . Stroke Maternal Grandmother   . Kidney failure Maternal Grandmother   . Hypertension Maternal Grandfather   . Stroke Maternal Grandfather   . Cancer Maternal Grandfather        skin  . Crohn's disease Cousin   . Colon cancer Neg Hx     Current Outpatient Medications on File Prior to Visit  Medication Sig Dispense Refill  .  amphetamine-dextroamphetamine (ADDERALL XR) 20 MG 24 hr capsule Take 1 capsule (20 mg total) by mouth every morning. 30 capsule 0  . amphetamine-dextroamphetamine (ADDERALL XR) 20 MG 24 hr capsule Take 1 capsule (20 mg total) by mouth daily. 30 capsule 0  . [START ON 04/16/2021] amphetamine-dextroamphetamine (ADDERALL XR) 20 MG 24 hr capsule Take 1 capsule (20 mg total) by mouth daily. 30 capsule 0  . amphetamine-dextroamphetamine (ADDERALL) 10 MG tablet Take 1 tablet (10 mg total) by mouth daily. 30 tablet 0  . amphetamine-dextroamphetamine (ADDERALL) 10 MG tablet Take 1 tablet (10 mg total) by mouth daily. 30 tablet 0  . [START ON 04/16/2021] amphetamine-dextroamphetamine (ADDERALL) 10 MG tablet Take 1 tablet (10 mg total) by mouth daily. 30 tablet 0  . Cholecalciferol (VITAMIN D3) 50 MCG (2000 UT) TABS Take 2,000 mg by mouth daily.    . Cyanocobalamin (B12 FAST DISSOLVE) 5000 MCG TBDP Take 1 tablet by mouth daily.    . Ferrous Sulfate (IRON) 325 (65 Fe) MG TABS Take 1 tablet by mouth daily.    . hydrOXYzine (ATARAX/VISTARIL) 25 MG tablet TAKE 1 TABLET BY MOUTH AT BEDTIME AS NEEDED. 90 tablet 1  . levonorgestrel (LILETTA, 52 MG,) 19.5 MCG/DAY IUD IUD 1 each by Intrauterine route once.    . Lysine 1000 MG TABS Take 1,000 mg by mouth daily.    . NP THYROID 60 MG   tablet Take 1 tablet (60 mg total) by mouth daily before breakfast. 30 tablet 3  . progesterone (PROMETRIUM) 200 MG capsule Take 1 capsule (200 mg total) by mouth daily. 30 capsule 3  . sertraline (ZOLOFT) 100 MG tablet Take two tablets daily. 60 tablet 2  . UNABLE TO FIND Med Name: Bio Cleanse takes 2 capsules every night    . valACYclovir (VALTREX) 1000 MG tablet Take 1,000 mg by mouth as needed.     No current facility-administered medications on file prior to visit.    Allergies  Allergen Reactions  . Latex Other (See Comments)    General irritation  . Doxycycline Rash    Solar rash    Social History   Substance and Sexual  Activity  Alcohol Use Yes  . Alcohol/week: 3.0 standard drinks  . Types: 3 Glasses of wine per week    Social History   Tobacco Use  Smoking Status Former Smoker  . Packs/day: 0.00  . Years: 8.00  . Pack years: 0.00  . Types: Cigarettes  . Quit date: 09/19/2014  . Years since quitting: 6.5  Smokeless Tobacco Never Used    Review of Systems  Constitutional: Positive for chills.  HENT: Negative.   Eyes: Positive for blurred vision.  Respiratory: Positive for shortness of breath.   Cardiovascular: Positive for chest pain.  Gastrointestinal: Positive for abdominal pain and nausea.  Genitourinary: Positive for frequency.  Musculoskeletal: Positive for back pain, joint pain and neck pain.  Skin: Negative.   Neurological: Positive for headaches.  Endo/Heme/Allergies: Negative.     Objective   Vitals:   03/30/21 1026  BP: 112/73  Pulse: (!) 105  Resp: 12  Temp: 99.1 F (37.3 C)  SpO2: 100%    Physical Exam Vitals reviewed.  Constitutional:      Appearance: Normal appearance. She is normal weight. She is not ill-appearing.  HENT:     Head: Normocephalic and atraumatic.  Cardiovascular:     Rate and Rhythm: Normal rate and regular rhythm.     Heart sounds: Normal heart sounds. No murmur heard. No friction rub. No gallop.   Pulmonary:     Effort: Pulmonary effort is normal. No respiratory distress.     Breath sounds: Normal breath sounds. No stridor. No wheezing, rhonchi or rales.  Abdominal:     General: Abdomen is flat. Bowel sounds are normal. There is no distension.     Palpations: Abdomen is soft. There is mass.     Tenderness: There is no abdominal tenderness. There is no guarding or rebound.     Hernia: A hernia is present.     Comments: The soft tissue mass of concern is along the anterior abdominal wall just to the right of midline.  It is also beneath a 5 mm trocar site that was used to take out her gallbladder in the remote past.  It is not reducible but  is more prominent when sitting up.  Skin:    General: Skin is warm and dry.  Neurological:     Mental Status: She is alert and oriented to person, place, and time.    CT scan images personally reviewed Assessment  Abdominal wall soft tissue mass, most likely an incisional hernia with some incarcerated adipose tissue.  I think malignancy is less likely. Plan   I reassured the patient that this is most likely an incisional hernia.  We will proceed with an incisional herniorrhaphy with possible mesh on 04/06/2021.  The risks and  benefits of the procedure including bleeding, infection, and the possibly recurrence of the mass were fully explained to the patient, who gave informed consent. 

## 2021-03-30 NOTE — Patient Instructions (Signed)

## 2021-03-30 NOTE — Progress Notes (Signed)
Cristina Davenport; [id number]; [birth year]   HPI Patient is a 35 year old white female who was referred to my care by Dr. Anastasio Champion for evaluation and treatment of an abdominal wall mass.  Patient states is been present for many years.  She states that it is occasionally tender to touch.  She has multiple other complaints including peripheral neuropathy and headaches.  A recent CT scan of the abdomen revealed a soft tissue mass which was difficult to fully classify.  She also had other findings on MRI of the thoracic spine.  She is status post laparoscopic cholecystectomy in the past.  She has been told in the past that this could be an incisional hernia. Past Medical History:  Diagnosis Date  . Anxiety   . Anxiety   . Bipolar 1 disorder ([place])   . GERD (gastroesophageal reflux disease)   . Hypertension    PIH  . IBS (irritable bowel syndrome)   . Migraine without aura     Past Surgical History:  Procedure Laterality Date  . ABDOMINAL SURGERY     cyst removed   . CHOLECYSTECTOMY N/A 07/10/2013   Procedure: LAPAROSCOPIC CHOLECYSTECTOMY;  Surgeon: Donato Heinz, MD;  Location: [facility];  Service: General;  Laterality: N/A;  . ESOPHAGOGASTRODUODENOSCOPY N/A 04/30/2013   [id number] distal esophagus of uncertain clinical significance-status post biopsy. Tiny hiatal hernia. No explanation  . EXAMINATION UNDER ANESTHESIA  04/21/2012   Gluteal wound repair  . WISDOM TOOTH EXTRACTION      Family History  Adopted: Yes  Problem Relation Age of Onset  . Mental illness Mother   . Cancer Father        skin cancer  . Hypertension Maternal Grandmother   . Stroke Maternal Grandmother   . Kidney failure Maternal Grandmother   . Hypertension Maternal Grandfather   . Stroke Maternal Grandfather   . Cancer Maternal Grandfather        skin  . Crohn's disease Cousin   . Colon cancer Neg Hx     Current Outpatient Medications on File Prior to Visit  Medication Sig Dispense Refill  .  amphetamine-dextroamphetamine (ADDERALL XR) 20 MG 24 hr capsule Take 1 capsule (20 mg total) by mouth every morning. 30 capsule 0  . amphetamine-dextroamphetamine (ADDERALL XR) 20 MG 24 hr capsule Take 1 capsule (20 mg total) by mouth daily. 30 capsule 0  . [START ON 04/16/2021] amphetamine-dextroamphetamine (ADDERALL XR) 20 MG 24 hr capsule Take 1 capsule (20 mg total) by mouth daily. 30 capsule 0  . amphetamine-dextroamphetamine (ADDERALL) 10 MG tablet Take 1 tablet (10 mg total) by mouth daily. 30 tablet 0  . amphetamine-dextroamphetamine (ADDERALL) 10 MG tablet Take 1 tablet (10 mg total) by mouth daily. 30 tablet 0  . [START ON 04/16/2021] amphetamine-dextroamphetamine (ADDERALL) 10 MG tablet Take 1 tablet (10 mg total) by mouth daily. 30 tablet 0  . Cholecalciferol (VITAMIN D3) 50 MCG (2000 UT) TABS Take 2,000 mg by mouth daily.    . Cyanocobalamin (B12 FAST DISSOLVE) 5000 MCG TBDP Take 1 tablet by mouth daily.    . Ferrous Sulfate (IRON) 325 (65 Fe) MG TABS Take 1 tablet by mouth daily.    . hydrOXYzine (ATARAX/VISTARIL) 25 MG tablet TAKE 1 TABLET BY MOUTH AT BEDTIME AS NEEDED. 90 tablet 1  . levonorgestrel (LILETTA, 52 MG,) 19.5 MCG/DAY IUD IUD 1 each by Intrauterine route once.    Marland Kitchen Lysine 1000 MG TABS Take 1,000 mg by mouth daily.    . NP THYROID 60 MG  tablet Take 1 tablet (60 mg total) by mouth daily before breakfast. 30 tablet 3  . progesterone (PROMETRIUM) 200 MG capsule Take 1 capsule (200 mg total) by mouth daily. 30 capsule 3  . sertraline (ZOLOFT) 100 MG tablet Take two tablets daily. 60 tablet 2  . UNABLE TO FIND Med Name: Bio Cleanse takes 2 capsules every night    . valACYclovir (VALTREX) 1000 MG tablet Take 1,000 mg by mouth as needed.     No current facility-administered medications on file prior to visit.    Allergies  Allergen Reactions  . Latex Other (See Comments)    General irritation  . Doxycycline Rash    Solar rash    Social History   Substance and Sexual  Activity  Alcohol Use Yes  . Alcohol/week: 3.0 standard drinks  . Types: 3 Glasses of wine per week    Social History   Tobacco Use  Smoking Status Former Smoker  . Packs/day: 0.00  . Years: 8.00  . Pack years: 0.00  . Types: Cigarettes  . Quit date: 09/19/2014  . Years since quitting: 6.5  Smokeless Tobacco Never Used    Review of Systems  Constitutional: Positive for chills.  HENT: Negative.   Eyes: Positive for blurred vision.  Respiratory: Positive for shortness of breath.   Cardiovascular: Positive for chest pain.  Gastrointestinal: Positive for abdominal pain and nausea.  Genitourinary: Positive for frequency.  Musculoskeletal: Positive for back pain, joint pain and neck pain.  Skin: Negative.   Neurological: Positive for headaches.  Endo/Heme/Allergies: Negative.     Objective   Vitals:   03/30/21 1026  BP: 112/73  Pulse: (!) 105  Resp: 12  Temp: 99.1 F (37.3 C)  SpO2: 100%    Physical Exam Vitals reviewed.  Constitutional:      Appearance: Normal appearance. She is normal weight. She is not ill-appearing.  HENT:     Head: Normocephalic and atraumatic.  Cardiovascular:     Rate and Rhythm: Normal rate and regular rhythm.     Heart sounds: Normal heart sounds. No murmur heard. No friction rub. No gallop.   Pulmonary:     Effort: Pulmonary effort is normal. No respiratory distress.     Breath sounds: Normal breath sounds. No stridor. No wheezing, rhonchi or rales.  Abdominal:     General: Abdomen is flat. Bowel sounds are normal. There is no distension.     Palpations: Abdomen is soft. There is mass.     Tenderness: There is no abdominal tenderness. There is no guarding or rebound.     Hernia: A hernia is present.     Comments: The soft tissue mass of concern is along the anterior abdominal wall just to the right of midline.  It is also beneath a 5 mm trocar site that was used to take out her gallbladder in the remote past.  It is not reducible but  is more prominent when sitting up.  Skin:    General: Skin is warm and dry.  Neurological:     Mental Status: She is alert and oriented to person, place, and time.    CT scan images personally reviewed Assessment  Abdominal wall soft tissue mass, most likely an incisional hernia with some incarcerated adipose tissue.  I think malignancy is less likely. Plan   I reassured the patient that this is most likely an incisional hernia.  We will proceed with an incisional herniorrhaphy with possible mesh on 04/06/2021.  The risks and  benefits of the procedure including bleeding, infection, and the possibly recurrence of the mass were fully explained to the patient, who gave informed consent.

## 2021-04-01 ENCOUNTER — Encounter: Payer: Self-pay | Admitting: Oncology

## 2021-04-01 ENCOUNTER — Other Ambulatory Visit: Payer: Self-pay

## 2021-04-01 ENCOUNTER — Inpatient Hospital Stay: Payer: 59

## 2021-04-01 ENCOUNTER — Inpatient Hospital Stay: Payer: 59 | Attending: Oncology | Admitting: Oncology

## 2021-04-01 ENCOUNTER — Other Ambulatory Visit: Payer: Self-pay | Admitting: Adult Health

## 2021-04-01 DIAGNOSIS — Z823 Family history of stroke: Secondary | ICD-10-CM

## 2021-04-01 DIAGNOSIS — R1013 Epigastric pain: Secondary | ICD-10-CM | POA: Diagnosis not present

## 2021-04-01 DIAGNOSIS — R1900 Intra-abdominal and pelvic swelling, mass and lump, unspecified site: Secondary | ICD-10-CM

## 2021-04-01 DIAGNOSIS — R29818 Other symptoms and signs involving the nervous system: Secondary | ICD-10-CM | POA: Insufficient documentation

## 2021-04-01 DIAGNOSIS — M546 Pain in thoracic spine: Secondary | ICD-10-CM | POA: Diagnosis not present

## 2021-04-01 DIAGNOSIS — Z8249 Family history of ischemic heart disease and other diseases of the circulatory system: Secondary | ICD-10-CM

## 2021-04-01 DIAGNOSIS — M47812 Spondylosis without myelopathy or radiculopathy, cervical region: Secondary | ICD-10-CM | POA: Diagnosis not present

## 2021-04-01 DIAGNOSIS — F411 Generalized anxiety disorder: Secondary | ICD-10-CM

## 2021-04-01 DIAGNOSIS — Z808 Family history of malignant neoplasm of other organs or systems: Secondary | ICD-10-CM | POA: Diagnosis not present

## 2021-04-01 DIAGNOSIS — M50222 Other cervical disc displacement at C5-C6 level: Secondary | ICD-10-CM | POA: Diagnosis not present

## 2021-04-01 DIAGNOSIS — Z79899 Other long term (current) drug therapy: Secondary | ICD-10-CM | POA: Diagnosis not present

## 2021-04-01 DIAGNOSIS — Z841 Family history of disorders of kidney and ureter: Secondary | ICD-10-CM | POA: Insufficient documentation

## 2021-04-01 DIAGNOSIS — Z818 Family history of other mental and behavioral disorders: Secondary | ICD-10-CM | POA: Insufficient documentation

## 2021-04-01 DIAGNOSIS — R222 Localized swelling, mass and lump, trunk: Secondary | ICD-10-CM | POA: Insufficient documentation

## 2021-04-01 DIAGNOSIS — Z87891 Personal history of nicotine dependence: Secondary | ICD-10-CM | POA: Insufficient documentation

## 2021-04-01 DIAGNOSIS — Z8379 Family history of other diseases of the digestive system: Secondary | ICD-10-CM | POA: Diagnosis not present

## 2021-04-01 DIAGNOSIS — M899 Disorder of bone, unspecified: Secondary | ICD-10-CM | POA: Insufficient documentation

## 2021-04-01 DIAGNOSIS — Z881 Allergy status to other antibiotic agents status: Secondary | ICD-10-CM | POA: Diagnosis not present

## 2021-04-01 DIAGNOSIS — R519 Headache, unspecified: Secondary | ICD-10-CM | POA: Insufficient documentation

## 2021-04-01 DIAGNOSIS — R200 Anesthesia of skin: Secondary | ICD-10-CM | POA: Diagnosis not present

## 2021-04-01 DIAGNOSIS — Z9049 Acquired absence of other specified parts of digestive tract: Secondary | ICD-10-CM | POA: Insufficient documentation

## 2021-04-01 DIAGNOSIS — F331 Major depressive disorder, recurrent, moderate: Secondary | ICD-10-CM

## 2021-04-01 DIAGNOSIS — M50322 Other cervical disc degeneration at C5-C6 level: Secondary | ICD-10-CM | POA: Diagnosis not present

## 2021-04-01 NOTE — Progress Notes (Signed)
[facility]  [phone] Fax:([phone]) [phone]  ID: Cristina Davenport OB: 26-Aug-1986  MR#: [id number]  [id number]  Patient Care Team: Doree Albee, MD as PCP - General (Internal Medicine) Gala Romney Cristopher Estimable, MD as Consulting Physician (Gastroenterology)  CHIEF COMPLAINT: Abdominal wall mass.  INTERVAL HISTORY: Patient is a 35 year old female who recently was evaluated in the emergency room with a significant increase in pain of unknown abdominal wall mass.  Patient states that she is had what she thought was a hernia for the past several years that of the last 7 to 10 days significantly increased in pain.  She also has multiple neurologic complaints and has an appointment with neurosurgery tomorrow.  She otherwise feels well.  She has no recent fevers or illnesses.  She denies any chest pain, shortness of breath, cough, or hemoptysis.  She denies any nausea, vomiting, constipation, or diarrhea.  She has no urinary complaints.  Patient otherwise feels well and offers no further specific complaints today.  REVIEW OF SYSTEMS:   Review of Systems  Constitutional: Negative.  Negative for fever, malaise/fatigue and weight loss.  Respiratory: Negative.  Negative for cough, hemoptysis and shortness of breath.   Cardiovascular: Negative.  Negative for chest pain and leg swelling.  Gastrointestinal: Positive for abdominal pain. Negative for blood in stool, constipation, diarrhea, nausea and vomiting.  Genitourinary: Negative.  Negative for dysuria.  Musculoskeletal: Negative for neck pain.  Skin: Negative.  Negative for rash.  Neurological: Positive for tingling and sensory change. Negative for speech change, focal weakness, weakness and headaches.  Psychiatric/Behavioral: The patient is nervous/anxious.     As per HPI. Otherwise, a complete review of systems is negative.  PAST MEDICAL HISTORY: Past Medical History:  Diagnosis Date  . Anxiety   . Anxiety   .  Bipolar 1 disorder ([place])   . GERD (gastroesophageal reflux disease)   . Hypertension    PIH  . IBS (irritable bowel syndrome)   . Migraine without aura     PAST SURGICAL HISTORY: Past Surgical History:  Procedure Laterality Date  . ABDOMINAL SURGERY     cyst removed   . CHOLECYSTECTOMY N/A 07/10/2013   Procedure: LAPAROSCOPIC CHOLECYSTECTOMY;  Surgeon: Donato Heinz, MD;  Location: [facility];  Service: General;  Laterality: N/A;  . ESOPHAGOGASTRODUODENOSCOPY N/A 04/30/2013   [id number] distal esophagus of uncertain clinical significance-status post biopsy. Tiny hiatal hernia. No explanation  . EXAMINATION UNDER ANESTHESIA  04/21/2012   Gluteal wound repair  . WISDOM TOOTH EXTRACTION      FAMILY HISTORY: Family History  Adopted: Yes  Problem Relation Age of Onset  . Mental illness Mother   . Cancer Father        skin cancer  . Hypertension Maternal Grandmother   . Stroke Maternal Grandmother   . Kidney failure Maternal Grandmother   . Hypertension Maternal Grandfather   . Stroke Maternal Grandfather   . Cancer Maternal Grandfather        skin  . Crohn's disease Cousin   . Colon cancer Neg Hx     ADVANCED DIRECTIVES (Y/N):  N  HEALTH MAINTENANCE: Social History   Tobacco Use  . Smoking status: Former Smoker    Packs/day: 0.00    Years: 8.00    Pack years: 0.00    Types: Cigarettes    Quit date: 09/19/2014    Years since quitting: 6.5  . Smokeless tobacco: Never Used  Vaping Use  . Vaping Use: Every day  Substance Use  Topics  . Alcohol use: Yes    Alcohol/week: 3.0 standard drinks    Types: 3 Glasses of wine per week  . Drug use: No     Colonoscopy:  PAP:  Bone density:  Lipid panel:  Allergies  Allergen Reactions  . Latex Other (See Comments)    General irritation  . Doxycycline Rash    Solar rash    Current Outpatient Medications  Medication Sig Dispense Refill  . acetaminophen (TYLENOL) 500 MG tablet Take 500 mg by mouth 2 (two) times daily as  needed for moderate pain.    Marland Kitchen amphetamine-dextroamphetamine (ADDERALL XR) 20 MG 24 hr capsule Take 1 capsule (20 mg total) by mouth daily. 30 capsule 0  . amphetamine-dextroamphetamine (ADDERALL) 10 MG tablet Take 1 tablet (10 mg total) by mouth daily. (Patient taking differently: Take 10 mg by mouth daily as needed (ADHD symptoms).) 30 tablet 0  . Apple Cider Vinegar 500 MG TABS Take 500 mg by mouth at bedtime.    . AZO-CRANBERRY PO Take 1 tablet by mouth at bedtime.    . Cholecalciferol (VITAMIN D3) 50 MCG (2000 UT) TABS Take 2,000 mg by mouth at bedtime.    . Cyanocobalamin (B-12) 5000 MCG CAPS Take 5,000 mcg by mouth daily.    Marland Kitchen docusate sodium (COLACE) 100 MG capsule Take 100 mg by mouth at bedtime.    . Ferrous Sulfate (IRON) 325 (65 Fe) MG TABS Take 325 mg by mouth at bedtime.    Marland Kitchen levonorgestrel (LILETTA, 52 MG,) 19.5 MCG/DAY IUD IUD 1 each by Intrauterine route once.    Marland Kitchen Lysine 1000 MG TABS Take 1,000 mg by mouth at bedtime.    . Magnesium 400 MG TABS Take 400 mg by mouth daily.    . progesterone (PROMETRIUM) 200 MG capsule Take 1 capsule (200 mg total) by mouth daily. (Patient taking differently: Take 200 mg by mouth at bedtime.) 30 capsule 3  . sertraline (ZOLOFT) 100 MG tablet Take two tablets daily. (Patient taking differently: Take 200 mg by mouth at bedtime.) 60 tablet 2  . Melatonin 5 MG CAPS Take 5 mg by mouth at bedtime as needed (sleep). (Patient not taking: Reported on 04/01/2021)    . NP THYROID 60 MG tablet Take 1 tablet (60 mg total) by mouth daily before breakfast. (Patient not taking: Reported on 04/01/2021) 30 tablet 3   No current facility-administered medications for this visit.    OBJECTIVE: Vitals:   04/01/21 1517  BP: 115/69  Pulse: (!) 111  Resp: 17  Temp: 98.8 F (37.1 C)  SpO2: 99%     Body mass index is 25.26 kg/m.    ECOG FS:1 - Symptomatic but completely ambulatory  General: Well-developed, well-nourished, no acute distress. Eyes: Pink  conjunctiva, anicteric sclera. HEENT: Normocephalic, moist mucous membranes. Lungs: No audible wheezing or coughing. Heart: Regular rate and rhythm. Abdomen: Soft, minimal epigastric tenderness, no distention, no distinct mass palpated. Musculoskeletal: No edema, cyanosis, or clubbing. Neuro: Alert, answering all questions appropriately. Cranial nerves grossly intact. Skin: No rashes or petechiae noted. Psych: Normal affect.  LAB RESULTS:  Lab Results  Component Value Date   NA 139 03/28/2021   K 3.7 03/28/2021   CL 105 03/28/2021   CO2 27 03/28/2021   GLUCOSE 100 (H) 03/28/2021   BUN 7 03/28/2021   CREATININE 0.72 03/28/2021   CALCIUM 9.3 03/28/2021   PROT 7.0 03/28/2021   ALBUMIN 4.3 03/28/2021   AST 16 03/28/2021   ALT 13 03/28/2021  ALKPHOS 42 03/28/2021   BILITOT 0.6 03/28/2021   GFRNONAA >60 03/28/2021   GFRAA >60 10/15/2019    Lab Results  Component Value Date   WBC 6.5 03/28/2021   NEUTROABS 3.7 03/28/2021   HGB 14.5 03/28/2021   HCT 43.9 03/28/2021   MCV 90.7 03/28/2021   PLT 292 03/28/2021     STUDIES: CT ABDOMEN PELVIS WO CONTRAST  Result Date: 03/26/2021 CLINICAL DATA:  Epigastric pain. upper abd pain states has a "bulge" to same area for 5 years. Pt states abd pain radiates through to back. EXAM: CT ABDOMEN AND PELVIS WITHOUT CONTRAST TECHNIQUE: Multidetector CT imaging of the abdomen and pelvis was performed following the standard protocol without IV contrast. COMPARISON:  CT abdomen pelvis 03/28/2006 FINDINGS: Lower chest: No acute abnormality. Hepatobiliary: No focal liver abnormality is seen. Status post cholecystectomy. No biliary dilatation. Pancreas: No focal lesion. Normal pancreatic contour. No surrounding inflammatory changes. No main pancreatic ductal dilatation. Spleen: Normal in size without focal abnormality. Adrenals/Urinary Tract: No adrenal nodule bilaterally. No nephrolithiasis, no hydronephrosis, and no contour-deforming renal mass. No  ureterolithiasis or hydroureter. Circumferential urinary bladder wall thickening likely due to under distension. Stomach/Bowel: Stomach is within normal limits. No evidence of bowel wall thickening or dilatation. Cecal diverticula. Appendix appears normal. Vascular/Lymphatic: No significant vascular findings are present. No enlarged abdominal or pelvic lymph nodes. Reproductive: The uterus is retroverted. T-shaped intrauterine device is noted in grossly appropriate position. Uterus and bilateral adnexa are unremarkable. Other: No intraperitoneal free fluid. No intraperitoneal free gas. No organized fluid collection. Musculoskeletal: Interval development of a 3.4 x 1.1 cm fat density lesion within the subcutaneus soft tissues of the supraumbilical abdominal wall slightly to the right of midline (2:38). This region correlates to a prior imaged region in 2017. Intralesional slight fat stranding noted. No acute or significant osseous findings. IMPRESSION: 1. Interval development of a slightly complex 3.4 x 1.1 cm lipomatous lesion within the anterior abdominal soft tissues slightly to the right of midline. This region of the soft tissues was previously evaluated on CT abdomen pelvis in 2017 at which time no lipomatous lesion is identified. Recommend nonemergent MRI for further evaluation of this lesion to exclude a liposarcoma. 2. Cecal diverticula with no acute diverticulitis. Electronically Signed   By: Iven Finn M.D.   On: 03/26/2021 01:08   MR BRAIN WO CONTRAST  Result Date: 03/28/2021 CLINICAL DATA:  Neuro deficit, acute, stroke suspected; transient diffuse numbness, weakness, and severe pain in head, neck and back in the context of recent discovery of abdominal mass. EXAM: MRI HEAD WITHOUT CONTRAST TECHNIQUE: Multiplanar, multiecho pulse sequences of the brain and surrounding structures were obtained without intravenous contrast. COMPARISON:  Prior head CT 12/23/2011. FINDINGS: Brain: Cerebral volume is  normal. No cortical encephalomalacia is identified. No significant white matter disease. There is no acute infarct. No evidence of intracranial mass. No chronic intracranial blood products. No extra-axial fluid collection. No midline shift. Vascular: Expected proximal arterial flow voids. Skull and upper cervical spine: No focal marrow lesion. Sinuses/Orbits: Visualized orbits show no acute finding. Moderate bilateral ethmoid sinus mucosal thickening. Trace bilateral maxillary sinus mucosal thickening. IMPRESSION: Unremarkable non-contrast MRI appearance of the brain. No evidence of acute intracranial abnormality. Paranasal sinus disease, as described. Electronically Signed   By: Kellie Simmering DO   On: 03/28/2021 20:37   MR CERVICAL SPINE WO CONTRAST  Result Date: 03/28/2021 CLINICAL DATA:  Transient diffuse numbness, weakness, severe pain in the head, neck and back in the context  of recent discovery of abdominal mass. EXAM: MRI CERVICAL SPINE WITHOUT CONTRAST TECHNIQUE: Multiplanar, multisequence MR imaging of the cervical spine was performed. No intravenous contrast was administered. COMPARISON:  CT cervical spine 01/10/2014. FINDINGS: Alignment: Straightening of the expected cervical lordosis. No significant spondylolisthesis. Vertebrae: No significant marrow edema or focal suspicious osseous lesion within the cervical spine. Cord: No spinal cord signal abnormality. Posterior Fossa, vertebral arteries, paraspinal tissues: Posterior fossa better assessed on concurrently performed brain MRI. Flow voids preserved within the imaged cervical vertebral arteries. Paraspinal soft tissues within normal limits. Disc levels: No more than mild disc degeneration at any level. C2-C3: No significant disc herniation or stenosis. C3-C4: No significant disc herniation or stenosis. C4-C5: No significant disc herniation or stenosis. C5-C6: Small central disc protrusion. Mild partial effacement of the ventral thecal sac without  spinal cord mass effect. No significant foraminal stenosis. C6-C7: Small central disc protrusion. Mild partial effacement of the ventral thecal sac without spinal cord mass effect. Minimal facet arthrosis. No significant foraminal stenosis. C7-T1: No significant disc herniation or stenosis. IMPRESSION: No cervical spinal cord signal abnormality is identified. Cervical spondylosis, as outlined and with findings most notably as follows. At C5-C6, there is mild disc degeneration. Small central disc protrusion. Mild partial effacement of the ventral thecal sac without spinal cord mass effect. No significant foraminal stenosis. At C6-C7, there is mild disc degeneration. Small central disc protrusion. Mild partial effacement of the ventral thecal sac without spinal cord mass effect. Minimal facet arthrosis. No significant foraminal stenosis. Electronically Signed   By: Kellie Simmering DO   On: 03/28/2021 20:44   MR THORACIC SPINE WO CONTRAST  Result Date: 03/28/2021 CLINICAL DATA:  Mid back pain; transient diffuse numbness, weakness, severe pain in head, neck and back in the context of recent discovery of abdominal mass. EXAM: MRI THORACIC SPINE WITHOUT CONTRAST TECHNIQUE: Multiplanar, multisequence MR imaging of the thoracic spine was performed. No intravenous contrast was administered. COMPARISON:  CT chest 07/19/2013. CT abdomen/pelvis FINDINGS: Intermittently motion degraded examination. Most notably, there is moderate motion degradation of the axial T2 GRE sequence. Alignment:  No significant spondylolisthesis. Vertebrae: Vertebral body height is maintained. 5 mm T2/STIR hyperintense focus within the anterior T2 vertebral body (series 21, image 9). Foci of T2/STIR hyperintensity within the left T3 and T4 pedicles/articular pillars measuring up to 13 mm (series 21, image 12). Minimal edema signal along the T10 superior endplate anteriorly appears degenerative. Cord: No signal abnormality is identified within the  thoracic spinal cord. Paraspinal and other soft tissues: No abnormality identified within included portions of the abdomen/retroperitoneum. Disc levels: Intervertebral disc height is maintained throughout the thoracic spine. No significant disc herniation or spinal canal stenosis within the thoracic spine. No significant foraminal stenosis. IMPRESSION: Intermittently motion degraded exam, as described. 5 mm T2/STIR hyperintense focus within the anterior T2 vertebral body. Additionally, there are foci of T2/STIR hyperintensity within the left T3 and T4 pedicles/articular pillars measuring up to 13 mm. These foci are nonspecific and may reflect benign lesions such as atypical hemangiomas. Alternative etiologies (such as osseous metastatic disease) cannot be definitively excluded. 6-8 week MRI follow-up recommended to ensure stability. No significant disc herniation, spinal canal stenosis or neural foraminal narrowing within the thoracic spine. No spinal cord signal abnormality identified. Electronically Signed   By: Kellie Simmering DO   On: 03/28/2021 20:57    ASSESSMENT: Abdominal wall mass.  PLAN:    1. Abdominal wall mass: CT scan results from Mar 26, 2021 reviewed independently  and reported as above with interval development of a 3.4 x 1.1 cm fat density lesion in the supraumbilical abdominal wall.  Patient is also mildly symptomatic.  She had an appointment with surgery earlier this week and plans to have laparoscopic evaluation of lesion and possible removal.  We also discussed the possibility of doing an MRI first followed by CT guided biopsy, but patient states she would prefer to go through with surgery given that she is symptomatic.  No follow-up has been scheduled at this time.  Will look at pathology.  If negative for malignancy no follow-up is necessary.  If pathology is positive, patient will return to clinic several days later to discuss the results and additional treatment planning. 2.  Vertebral  lesions: MRI of thoracic spine from Mar 28, 2021 reviewed independently with nonspecific foci.  If abdominal lesion is negative, no further work-up is necessary.  If abdominal lesion is malignant, patient will require PET scan and/or nuclear med bone scan. 3.  Multiple neurologic complaints: MRI results as above.  Patient has consultation with neurosurgery tomorrow.  Patient has no definite spinal cord abnormality or neuroforaminal narrowing impinging on nerves.  I spent a total of 60 minutes reviewing chart data, face-to-face evaluation with the patient, counseling and coordination of care as detailed above.  Patient expressed understanding and was in agreement with this plan. She also understands that She can call clinic at any time with any questions, concerns, or complaints.   Cancer Staging No matching staging information was found for the patient.  Lloyd Huger, MD   04/01/2021 4:01 PM

## 2021-04-01 NOTE — Patient Instructions (Signed)
Cristina Davenport  04/01/2021     @PREFPERIOPPHARMACY @   Your procedure is scheduled on  04/06/2021.   Report to [facility] at  [phone]  A.M.   Call this number if you have problems the morning of surgery:  [phone]   Remember:  Do not eat or drink after midnight.                        Take these medicines the morning of surgery with A SIP OF WATER  Adderall, NP thyroid.   Place clean sheets on your bed the night before your procedure and DO NOT sleep with pets this night.  Shower with CHG the night before and the morning of your procedure and DO NOT use CHG on your face, hair or genitals.  After each shower, dry off with a clean towel, put on clean, comfortable clothes and brush your teeth.      Do not wear jewelry, make-up or nail polish.  Do not wear lotions, powders, or perfumes, or deodorant.  Do not shave 48 hours prior to surgery.  Men may shave face and neck.  Do not bring valuables to the hospital.  [facility] is not responsible for any belongings or valuables.  Contacts, dentures or bridgework may not be worn into surgery.  Leave your suitcase in the car.  After surgery it may be brought to your room.  For patients admitted to the hospital, discharge time will be determined by your treatment team.  Patients discharged the day of surgery will not be allowed to drive home and must have someone with them for 24 hours.   Special instructions:  DO NOT smoke tobacco or vape for 24 hours before your procedure.  Please read over the following fact sheets that you were given. Coughing and Deep Breathing, Surgical Site Infection Prevention, Anesthesia Post-op Instructions and Care and Recovery After Surgery       Open Hernia Repair, Adult, Care After What can I expect after the procedure? After the procedure, it is common to have:  Mild discomfort.  Slight bruising.  Mild swelling.  Pain in the belly (abdomen).  A small amount of blood from the  cut from surgery (incision). Follow these instructions at home: Your doctor may give you more specific instructions. If you have problems, call your doctor. Medicines  Take over-the-counter and prescription medicines only as told by your doctor.  If told, take steps to prevent problems with pooping (constipation). You may need to: ? Drink enough fluid to keep your pee (urine) pale yellow. ? Take medicines. You will be told what medicines to take. ? Eat foods that are high in fiber. These include beans, whole grains, and fresh fruits and vegetables. ? Limit foods that are high in fat and sugar. These include fried or sweet foods.  Ask your doctor if you should avoid driving or using machines while you are taking your medicine. Incision care  Follow instructions from your doctor about how to take care of your incision. Make sure you: ? Wash your hands with soap and water for at least 20 seconds before and after you change your bandage (dressing). If you cannot use soap and water, use hand sanitizer. ? Change your bandage. ? Leave stitches or skin glue in place for at least 2 weeks. ? Leave tape strips alone unless you are told to take them off. You may trim the edges of the  tape strips if they curl up.  Check your incision every day for signs of infection. Check for: ? More redness, swelling, or pain. ? More fluid or blood. ? Warmth. ? Pus or a bad smell.  Wear loose, soft clothing while your incision heals.   Activity  Rest as told by your doctor.  Do not lift anything that is heavier than 10 lb (4.5 kg), or the limit that you are told.  Do not play contact sports until your doctor says that this is safe.  If you were given a sedative during your procedure, do not drive or use machines until your doctor says that it is safe. A sedative is a medicine that helps you relax.  Return to your normal activities when your doctor says that it is safe.   General instructions  Do not  take baths, swim, or use a hot tub. Ask your doctor about taking showers or sponge baths.  Hold a pillow over your belly when you cough or sneeze. This helps with pain.  Do not smoke or use any products that contain nicotine or tobacco. If you need help quitting, ask your doctor.  Keep all follow-up visits. Contact a doctor if:  You have any of these signs of infection in or around your incision: ? More redness, swelling, or pain. ? More fluid or blood. ? Warmth. ? Pus. ? A bad smell.  You have a fever or chills.  You have blood in your poop (stool).  You have not pooped (had a bowel movement) in 2-3 days.  Medicine does not help your pain. Get help right away if:  You have chest pain, or you are short of breath.  You feel faint or light-headed.  You have very bad pain.  You vomit and your pain is worse.  You have pain, swelling, or redness in a leg. These symptoms may be an emergency. Get help right away. Call your local emergency services (911 in the [place]  Do not wait to see if the symptoms will go away.  Do not drive yourself to the hospital. Summary  After this procedure, it is common to have mild discomfort, slight bruising, and mild swelling.  Follow instructions from your doctor about how to take care of your cut from surgery (incision). Check every day for signs of infection.  Do not lift heavy objects or play contact sports until your doctor says it is safe.  Return to your normal activities as told by your doctor. This information is not intended to replace advice given to you by your health care provider. Make sure you discuss any questions you have with your health care provider. Document Revised: 06/15/2020 Document Reviewed: 06/15/2020 Elsevier Patient Education  2021 [place] Anesthesia, Adult, Care After This sheet gives you information about how to care for yourself after your procedure. Your health care provider may also give you  more specific instructions. If you have problems or questions, contact your health care provider. What can I expect after the procedure? After the procedure, the following side effects are common:  Pain or discomfort at the IV site.  Nausea.  Vomiting.  Sore throat.  Trouble concentrating.  Feeling cold or chills.  Feeling weak or tired.  Sleepiness and fatigue.  Soreness and body aches. These side effects can affect parts of the body that were not involved in surgery. Follow these instructions at home: For the time period you were told by your health care provider:  Rest.  Do not participate in activities where you could fall or become injured.  Do not drive or use machinery.  Do not drink alcohol.  Do not take sleeping pills or medicines that cause drowsiness.  Do not make important decisions or sign legal documents.  Do not take care of children on your own.   Eating and drinking  Follow any instructions from your health care provider about eating or drinking restrictions.  When you feel hungry, start by eating small amounts of foods that are soft and easy to digest (bland), such as toast. Gradually return to your regular diet.  Drink enough fluid to keep your urine pale yellow.  If you vomit, rehydrate by drinking water, juice, or clear broth. General instructions  If you have sleep apnea, surgery and certain medicines can increase your risk for breathing problems. Follow instructions from your health care provider about wearing your sleep device: ? Anytime you are sleeping, including during daytime naps. ? While taking prescription pain medicines, sleeping medicines, or medicines that make you drowsy.  Have a responsible adult stay with you for the time you are told. It is important to have someone help care for you until you are awake and alert.  Return to your normal activities as told by your health care provider. Ask your health care provider what  activities are safe for you.  Take over-the-counter and prescription medicines only as told by your health care provider.  If you smoke, do not smoke without supervision.  Keep all follow-up visits as told by your health care provider. This is important. Contact a health care provider if:  You have nausea or vomiting that does not get better with medicine.  You cannot eat or drink without vomiting.  You have pain that does not get better with medicine.  You are unable to pass urine.  You develop a skin rash.  You have a fever.  You have redness around your IV site that gets worse. Get help right away if:  You have difficulty breathing.  You have chest pain.  You have blood in your urine or stool, or you vomit blood. Summary  After the procedure, it is common to have a sore throat or nausea. It is also common to feel tired.  Have a responsible adult stay with you for the time you are told. It is important to have someone help care for you until you are awake and alert.  When you feel hungry, start by eating small amounts of foods that are soft and easy to digest (bland), such as toast. Gradually return to your regular diet.  Drink enough fluid to keep your urine pale yellow.  Return to your normal activities as told by your health care provider. Ask your health care provider what activities are safe for you. This information is not intended to replace advice given to you by your health care provider. Make sure you discuss any questions you have with your health care provider. Document Revised: 07/16/2020 Document Reviewed: 02/13/2020 Elsevier Patient Education  2021 [employer].

## 2021-04-02 ENCOUNTER — Other Ambulatory Visit (HOSPITAL_COMMUNITY)
Admission: RE | Admit: 2021-04-02 | Discharge: 2021-04-02 | Disposition: A | Discharge status: Home / Self Care | Payer: 59 | Source: Ambulatory Visit | Attending: General Surgery | Admitting: General Surgery

## 2021-04-02 ENCOUNTER — Encounter (HOSPITAL_COMMUNITY)
Admission: RE | Admit: 2021-04-02 | Discharge: 2021-04-02 | Disposition: A | Discharge status: Home / Self Care | Payer: 59 | Source: Ambulatory Visit | Attending: General Surgery | Admitting: General Surgery

## 2021-04-02 ENCOUNTER — Other Ambulatory Visit: Payer: Self-pay

## 2021-04-02 ENCOUNTER — Encounter (HOSPITAL_COMMUNITY): Payer: Self-pay

## 2021-04-02 DIAGNOSIS — M549 Dorsalgia, unspecified: Secondary | ICD-10-CM | POA: Diagnosis not present

## 2021-04-02 DIAGNOSIS — Z01812 Encounter for preprocedural laboratory examination: Secondary | ICD-10-CM | POA: Insufficient documentation

## 2021-04-02 DIAGNOSIS — Z20822 Contact with and (suspected) exposure to covid-19: Secondary | ICD-10-CM | POA: Insufficient documentation

## 2021-04-02 DIAGNOSIS — D492 Neoplasm of unspecified behavior of bone, soft tissue, and skin: Secondary | ICD-10-CM | POA: Diagnosis not present

## 2021-04-02 HISTORY — DX: Family history of other specified conditions: Z84.89

## 2021-04-02 LAB — PREGNANCY, URINE: Preg Test, Ur: NEGATIVE

## 2021-04-03 LAB — SARS CORONAVIRUS 2 (TAT 6-24 HRS): SARS Coronavirus 2: NEGATIVE

## 2021-04-05 ENCOUNTER — Encounter (INDEPENDENT_AMBULATORY_CARE_PROVIDER_SITE_OTHER): Payer: Self-pay | Admitting: Internal Medicine

## 2021-04-05 ENCOUNTER — Telehealth (INDEPENDENT_AMBULATORY_CARE_PROVIDER_SITE_OTHER): Payer: Self-pay

## 2021-04-05 ENCOUNTER — Other Ambulatory Visit (INDEPENDENT_AMBULATORY_CARE_PROVIDER_SITE_OTHER): Payer: Self-pay | Admitting: Internal Medicine

## 2021-04-05 DIAGNOSIS — R937 Abnormal findings on diagnostic imaging of other parts of musculoskeletal system: Secondary | ICD-10-CM

## 2021-04-05 NOTE — Telephone Encounter (Signed)
Great work!  Please let the patient know.

## 2021-04-05 NOTE — Telephone Encounter (Signed)
Authorization Number: [id number] Review Date: 04/05/2021 9:25:12 AM Expiration Date: 05/20/2021 Status: Your case has been Approved.  This is the prior Auth for the mri thoracic spine w/ contrast. Appt is scheduled. And the approval is attached to order in work que.

## 2021-04-06 ENCOUNTER — Ambulatory Visit (HOSPITAL_COMMUNITY): Payer: 59 | Admitting: Anesthesiology

## 2021-04-06 ENCOUNTER — Ambulatory Visit (HOSPITAL_COMMUNITY)
Admission: RE | Admit: 2021-04-06 | Discharge: 2021-04-06 | Disposition: A | Discharge status: Home / Self Care | Payer: 59 | Attending: General Surgery | Admitting: General Surgery

## 2021-04-06 ENCOUNTER — Encounter: Payer: Self-pay | Admitting: General Surgery

## 2021-04-06 ENCOUNTER — Encounter (HOSPITAL_COMMUNITY): Payer: Self-pay | Admitting: General Surgery

## 2021-04-06 ENCOUNTER — Encounter (HOSPITAL_COMMUNITY)
Admission: RE | Disposition: A | Discharge status: Home / Self Care | Payer: Self-pay | Source: Home / Self Care | Attending: General Surgery

## 2021-04-06 DIAGNOSIS — K432 Incisional hernia without obstruction or gangrene: Secondary | ICD-10-CM | POA: Diagnosis not present

## 2021-04-06 DIAGNOSIS — Z91040 Latex allergy status: Secondary | ICD-10-CM | POA: Diagnosis not present

## 2021-04-06 DIAGNOSIS — G629 Polyneuropathy, unspecified: Secondary | ICD-10-CM | POA: Diagnosis not present

## 2021-04-06 DIAGNOSIS — Z87891 Personal history of nicotine dependence: Secondary | ICD-10-CM | POA: Diagnosis not present

## 2021-04-06 DIAGNOSIS — K430 Incisional hernia with obstruction, without gangrene: Secondary | ICD-10-CM | POA: Diagnosis not present

## 2021-04-06 DIAGNOSIS — Z79899 Other long term (current) drug therapy: Secondary | ICD-10-CM | POA: Diagnosis not present

## 2021-04-06 DIAGNOSIS — I10 Essential (primary) hypertension: Secondary | ICD-10-CM | POA: Insufficient documentation

## 2021-04-06 DIAGNOSIS — Z8249 Family history of ischemic heart disease and other diseases of the circulatory system: Secondary | ICD-10-CM | POA: Insufficient documentation

## 2021-04-06 DIAGNOSIS — Z9049 Acquired absence of other specified parts of digestive tract: Secondary | ICD-10-CM | POA: Diagnosis not present

## 2021-04-06 DIAGNOSIS — Z79890 Hormone replacement therapy (postmenopausal): Secondary | ICD-10-CM | POA: Diagnosis not present

## 2021-04-06 DIAGNOSIS — Z8379 Family history of other diseases of the digestive system: Secondary | ICD-10-CM | POA: Diagnosis not present

## 2021-04-06 DIAGNOSIS — Z881 Allergy status to other antibiotic agents status: Secondary | ICD-10-CM | POA: Insufficient documentation

## 2021-04-06 HISTORY — PX: INCISIONAL HERNIA REPAIR: SHX193

## 2021-04-06 SURGERY — REPAIR, HERNIA, INCISIONAL
Anesthesia: General

## 2021-04-06 MED ORDER — FENTANYL CITRATE (PF) 100 MCG/2ML IJ SOLN
INTRAMUSCULAR | Status: DC | PRN
  Administered 2021-04-06: 25 ug via INTRAVENOUS
  Administered 2021-04-06: 50 ug via INTRAVENOUS
  Administered 2021-04-06: 25 ug via INTRAVENOUS

## 2021-04-06 MED ORDER — MEPERIDINE HCL 50 MG/ML IJ SOLN
6.2500 mg | INTRAMUSCULAR | Status: DC | PRN
  Administered 2021-04-06: 12.5 mg via INTRAVENOUS
  Filled 2021-04-06: qty 1

## 2021-04-06 MED ORDER — BUPIVACAINE LIPOSOME 1.3 % IJ SUSP
INTRAMUSCULAR | Status: DC | PRN
  Administered 2021-04-06: 10 mL

## 2021-04-06 MED ORDER — CEFAZOLIN SODIUM-DEXTROSE 2-4 GM/100ML-% IV SOLN
2.0000 g | INTRAVENOUS | Status: AC
  Administered 2021-04-06: 2 g via INTRAVENOUS

## 2021-04-06 MED ORDER — CHLORHEXIDINE GLUCONATE CLOTH 2 % EX PADS: 6.0000 | MEDICATED_PAD | Freq: Once | CUTANEOUS | Status: DC

## 2021-04-06 MED ORDER — PROPOFOL 10 MG/ML IV BOLUS
INTRAVENOUS | Status: DC | PRN
  Administered 2021-04-06: 40 mg via INTRAVENOUS
  Administered 2021-04-06: 25 ug/kg/min via INTRAVENOUS
  Administered 2021-04-06: 40 mg via INTRAVENOUS
  Administered 2021-04-06: 200 mg via INTRAVENOUS

## 2021-04-06 MED ORDER — LACTATED RINGERS IV SOLN
INTRAVENOUS | Status: DC
  Administered 2021-04-06: 1000 mL via INTRAVENOUS

## 2021-04-06 MED ORDER — 0.9 % SODIUM CHLORIDE (POUR BTL) OPTIME
TOPICAL | Status: DC | PRN
  Administered 2021-04-06: 1000 mL

## 2021-04-06 MED ORDER — LIDOCAINE HCL (CARDIAC) PF 100 MG/5ML IV SOSY
PREFILLED_SYRINGE | INTRAVENOUS | Status: DC | PRN
  Administered 2021-04-06: 80 mg via INTRAVENOUS

## 2021-04-06 MED ORDER — ONDANSETRON HCL 4 MG/2ML IJ SOLN
INTRAMUSCULAR | Status: DC | PRN
  Administered 2021-04-06: 4 mg via INTRAVENOUS

## 2021-04-06 MED ORDER — CHLORHEXIDINE GLUCONATE 0.12 % MT SOLN: 15.0000 mL | Freq: Once | OROMUCOSAL | Status: DC

## 2021-04-06 MED ORDER — ONDANSETRON HCL 4 MG/2ML IJ SOLN
INTRAMUSCULAR | Status: AC
  Filled 2021-04-06: qty 2

## 2021-04-06 MED ORDER — DEXAMETHASONE SODIUM PHOSPHATE 4 MG/ML IJ SOLN
INTRAMUSCULAR | Status: DC | PRN
  Administered 2021-04-06: 10 mg via INTRAVENOUS

## 2021-04-06 MED ORDER — CHLORHEXIDINE GLUCONATE 0.12 % MT SOLN
OROMUCOSAL | Status: AC
  Administered 2021-04-06: 15 mL
  Filled 2021-04-06: qty 15

## 2021-04-06 MED ORDER — DEXAMETHASONE SODIUM PHOSPHATE 10 MG/ML IJ SOLN
INTRAMUSCULAR | Status: AC
  Filled 2021-04-06: qty 1

## 2021-04-06 MED ORDER — LIDOCAINE HCL (PF) 2 % IJ SOLN
INTRAMUSCULAR | Status: AC
  Filled 2021-04-06: qty 5

## 2021-04-06 MED ORDER — BUPIVACAINE LIPOSOME 1.3 % IJ SUSP
INTRAMUSCULAR | Status: AC
  Filled 2021-04-06: qty 20

## 2021-04-06 MED ORDER — MIDAZOLAM HCL 2 MG/2ML IJ SOLN
INTRAMUSCULAR | Status: AC
  Filled 2021-04-06: qty 2

## 2021-04-06 MED ORDER — CEFAZOLIN SODIUM-DEXTROSE 2-4 GM/100ML-% IV SOLN
INTRAVENOUS | Status: AC
  Filled 2021-04-06: qty 100

## 2021-04-06 MED ORDER — MIDAZOLAM HCL 5 MG/5ML IJ SOLN
INTRAMUSCULAR | Status: DC | PRN
  Administered 2021-04-06: 2 mg via INTRAVENOUS

## 2021-04-06 MED ORDER — KETOROLAC TROMETHAMINE 30 MG/ML IJ SOLN
30.0000 mg | Freq: Once | INTRAMUSCULAR | Status: AC
  Administered 2021-04-06: 30 mg via INTRAVENOUS
  Filled 2021-04-06: qty 1

## 2021-04-06 MED ORDER — ORAL CARE MOUTH RINSE: 15.0000 mL | Freq: Once | OROMUCOSAL | Status: DC

## 2021-04-06 MED ORDER — HYDROCODONE-ACETAMINOPHEN 5-325 MG PO TABS: 1.0000 | ORAL_TABLET | ORAL | 0 refills | Status: DC | PRN

## 2021-04-06 MED ORDER — ONDANSETRON HCL 4 MG/2ML IJ SOLN
4.0000 mg | Freq: Once | INTRAMUSCULAR | Status: DC | PRN
Start: 2021-04-06 — End: 2021-04-06

## 2021-04-06 MED ORDER — HYDROMORPHONE HCL 1 MG/ML IJ SOLN: 0.2500 mg | INTRAMUSCULAR | Status: DC | PRN

## 2021-04-06 MED ORDER — FENTANYL CITRATE (PF) 100 MCG/2ML IJ SOLN
INTRAMUSCULAR | Status: AC
  Filled 2021-04-06: qty 2

## 2021-04-06 MED ORDER — PROPOFOL 10 MG/ML IV BOLUS
INTRAVENOUS | Status: AC
  Filled 2021-04-06: qty 40

## 2021-04-06 SURGICAL SUPPLY — 28 items
ADH SKN CLS APL DERMABOND .7 (GAUZE/BANDAGES/DRESSINGS) ×1
APL PRP STRL LF DISP 70% ISPRP (MISCELLANEOUS) ×1
BLADE SURG SZ11 CARB STEEL (BLADE) ×2 IMPLANT
CHLORAPREP W/TINT 26 (MISCELLANEOUS) ×2 IMPLANT
CLOTH BEACON ORANGE TIMEOUT ST (SAFETY) ×2 IMPLANT
COVER LIGHT HANDLE STERIS (MISCELLANEOUS) ×4 IMPLANT
COVER WAND RF STERILE (DRAPES) ×2 IMPLANT
DERMABOND ADVANCED (GAUZE/BANDAGES/DRESSINGS) ×1
DERMABOND ADVANCED .7 DNX12 (GAUZE/BANDAGES/DRESSINGS) ×1 IMPLANT
ELECT REM PT RETURN 9FT ADLT (ELECTROSURGICAL) ×2
ELECTRODE REM PT RTRN 9FT ADLT (ELECTROSURGICAL) ×1 IMPLANT
GOWN STRL REUS W/TWL LRG LVL3 (GOWN DISPOSABLE) ×6 IMPLANT
INST SET MINOR GENERAL (KITS) ×2 IMPLANT
KIT TURNOVER KIT A (KITS) ×2 IMPLANT
MANIFOLD NEPTUNE II (INSTRUMENTS) ×2 IMPLANT
NEEDLE HYPO 21X1.5 SAFETY (NEEDLE) ×2 IMPLANT
NS IRRIG 1000ML POUR BTL (IV SOLUTION) ×2 IMPLANT
PACK MINOR (CUSTOM PROCEDURE TRAY) ×1 IMPLANT
PAD ARMBOARD 7.5X6 YLW CONV (MISCELLANEOUS) ×2 IMPLANT
PENCIL SMOKE EVACUATOR (MISCELLANEOUS) ×2 IMPLANT
SET BASIN LINEN APH (SET/KITS/TRAYS/PACK) ×2 IMPLANT
SUT ETHIBOND 0 MO6 C/R (SUTURE) ×1 IMPLANT
SUT MNCRL AB 4-0 PS2 18 (SUTURE) ×3 IMPLANT
SUT VIC AB 2-0 CT1 27 (SUTURE) ×2
SUT VIC AB 2-0 CT1 TAPERPNT 27 (SUTURE) ×1 IMPLANT
SUT VIC AB 3-0 SH 27 (SUTURE) ×2
SUT VIC AB 3-0 SH 27X BRD (SUTURE) ×1 IMPLANT
SYR 20ML LL LF (SYRINGE) ×4 IMPLANT

## 2021-04-06 NOTE — Interval H&P Note (Signed)
History and Physical Interval Note:  04/06/2021 7:12 AM  Arpi B Sharol Roussel  has presented today for surgery, with the diagnosis of Incisional Hernia.  The various methods of treatment have been discussed with the patient and family. After consideration of risks, benefits and other options for treatment, the patient has consented to  Procedure(s): [place] (N/A) as a surgical intervention.  The patient's history has been reviewed, patient examined, no change in status, stable for surgery.  I have reviewed the patient's chart and labs.  Questions were answered to the patient's satisfaction.     Aviva Signs

## 2021-04-06 NOTE — Op Note (Signed)
Patient:  Cristina Davenport  DOB:  1985/12/30  MRN:  [id number]   Preop Diagnosis: Incisional hernia  Postop Diagnosis: Same, incarcerated adipose tissue  Procedure: Incisional herniorrhaphy  Surgeon: Aviva Signs, MD  Anes: General   Indications: Patient is a 35 year old white female status post laparoscopic cholecystectomy in the remote past who presents with a midline incisional hernia with incarcerated adipose tissue at a 5 mm trocar site.  Risks and benefits of the procedure including bleeding, infection, and recurrence of the hernia were fully explained to the patient, who gave informed consent.  Procedure note: Patient was placed in supine position.  After general anesthesia was administered, the abdomen was prepped and draped using the usual sterile technique with ChloraPrep.  Surgical site confirmation was performed.  A longitudinal incision was made over the hernia defect along the midportion of the midline.  This was taken down to the fascia.  A 5 mm defect was found.  There was adipose tissue and part of the falciform ligament protruding through this.  This was excised at the fascial level and disposed of.  I was then able to reduce the rest of the hernia.  The hernia defect was then closed primarily with 0 Ethibond interrupted sutures.  The subcutaneous layer was reapproximated using 3-0 Vicryl interrupted suture.  Exparel was instilled into the surrounding wound.  The skin was closed using a 4-0 Monocryl subcuticular suture.  Dermabond was applied.  All tape and needle counts were correct at the end of the procedure.  The patient was awakened and transferred to PACU in stable condition.  Complications: None  EBL: Minimal  Specimen: None

## 2021-04-06 NOTE — Anesthesia Postprocedure Evaluation (Signed)
Anesthesia Post Note  Patient: Cristina Davenport  Procedure(s) Performed: INCISIONAL HERNIORRHAPHY (N/A )  Patient location during evaluation: PACU Anesthesia Type: General Level of consciousness: awake and alert and oriented Pain management: pain level controlled Vital Signs Assessment: post-procedure vital signs reviewed and stable Respiratory status: spontaneous breathing and respiratory function stable Cardiovascular status: blood pressure returned to baseline and stable Postop Assessment: no apparent nausea or vomiting Anesthetic complications: no   No complications documented.   Last Vitals:  Vitals:   04/06/21 0900 04/06/21 0908  BP: 116/68 120/75  Pulse: 84 84  Resp: 19 18  Temp:  36.7 C  SpO2: 100%     Last Pain:  Vitals:   04/06/21 0908  TempSrc: Oral  PainSc: 0-No pain                 Almendra Loria C Haskel Dewalt

## 2021-04-06 NOTE — Anesthesia Procedure Notes (Signed)
Procedure Name: LMA Insertion Performed by: Tacy Learn, CRNA Pre-anesthesia Checklist: Patient identified, Emergency Drugs available, Suction available, Patient being monitored and Timeout performed Patient Re-evaluated:Patient Re-evaluated prior to induction Oxygen Delivery Method: Circle system utilized Preoxygenation: Pre-oxygenation with 100% oxygen Induction Type: IV induction LMA: LMA inserted LMA Size: 3.0 Number of attempts: 1 Placement Confirmation: positive ETCO2,  CO2 detector and breath sounds checked- equal and bilateral Tube secured with: Tape Dental Injury: Teeth and Oropharynx as per pre-operative assessment

## 2021-04-06 NOTE — Transfer of Care (Signed)
Immediate Anesthesia Transfer of Care Note  Patient: Cristina Davenport  Procedure(s) Performed: INCISIONAL HERNIORRHAPHY (N/A )  Patient Location: PACU  Anesthesia Type:General  Level of Consciousness: awake, alert , oriented and patient cooperative  Airway & Oxygen Therapy: Patient Spontanous Breathing  Post-op Assessment: Report given to RN, Post -op Vital signs reviewed and stable and Patient moving all extremities  Post vital signs: Reviewed and stable  Last Vitals:  Vitals Value Taken Time  BP    Temp    Pulse    Resp 15 04/06/21 0812  SpO2    Vitals shown include unvalidated device data.  Last Pain:  Vitals:   04/06/21 0648  TempSrc: Oral  PainSc: 4       Patients Stated Pain Goal: 8 (07/57/32 2567)  Complications: No complications documented.

## 2021-04-06 NOTE — Discharge Instructions (Signed)
Open Hernia Repair, Adult, Care After The following information offers guidance on how to care for yourself after your procedure. Your health care provider may also give you more specific instructions. If you have problems or questions, contact your health care provider. What can I expect after the procedure? After the procedure, it is common to have:  Mild discomfort.  Slight bruising.  Minor swelling.  Pain in the abdomen.  A small amount of blood from the incision. Follow these instructions at home: Medicines  Take over-the-counter and prescription medicines only as told by your health care provider.  Ask your health care provider if the medicine prescribed to you: ? Requires you to avoid driving or using machinery. ? Can cause constipation. You may need to take these actions to prevent or treat constipation:  Drink enough fluid to keep your urine pale yellow.  Take over-the-counter or prescription medicines.  Eat foods that are high in fiber, such as beans, whole grains, and fresh fruits and vegetables.  Limit foods that are high in fat and processed sugars, such as fried or sweet foods.  If you were prescribed an antibiotic medicine, take it as told by your health care provider. Do not stop using the antibiotic even if you start to feel better. Incision care  Follow instructions from your health care provider about how to take care of your incision. Make sure you: ? Wash your hands with soap and water for at least 20 seconds before and after you change your bandage (dressing). If soap and water are not available, use hand sanitizer. ? Change your dressing as told by your health care provider. ? Leave stitches (sutures), skin glue, or adhesive strips in place. These skin closures may need to stay in place for 2 weeks or longer. If adhesive strip edges start to loosen and curl up, you may trim the loose edges. Do not remove adhesive strips completely unless your health care  provider tells you to do that.  Check your incision area every day for signs of infection. Check for: ? More redness, swelling, or pain. ? More fluid or blood. ? Warmth. ? Pus or a bad smell.  Wear loose, soft clothing while your incision heals.   Activity  Rest as told by your health care provider.  Do not lift anything that is heavier than 10 lb (4.5 kg), or the limit that you are told, until your health care provider says that it is safe.  Do not play contact sports until your health care provider says that this is safe.  If you were given a sedative during the procedure, it can affect you for several hours. Do not drive or operate machinery until your health care provider says that it is safe.  Return to your normal activities as told by your health care provider. Ask your health care provider what activities are safe for you.   General instructions  Do not take baths, swim, or use a hot tub until your health care provider approves. Ask your health care provider if you may take showers. You may only be allowed to take sponge baths.  Hold a pillow over your abdomen when you cough or sneeze. This helps with pain.  Do not use any products that contain nicotine or tobacco. These products include cigarettes, chewing tobacco, and vaping devices, such as e-cigarettes. If you need help quitting, ask your health care provider.  Keep all follow-up visits. This is important. Contact a health care provider if:  You  have any of these signs of infection: ? More redness, swelling, or pain around your incision. ? More fluid or blood coming from your incision. ? Warmth coming from your incision. ? Pus or a bad smell coming from your incision. ? A fever or chills.  You have blood in your stool (feces).  You have not had a bowel movement in 2-3 days.  Your pain is not controlled with medicine. Get help right away if:  You have chest pain or shortness of breath.  You feel faint or  light-headed.  You have severe pain.  You vomit and your pain is worse.  You have pain, swelling, or redness in a leg. These symptoms may represent a serious problem that is an emergency. Do not wait to see if the symptoms will go away. Get medical help right away. Call your local emergency services (911 in the [place] Do not drive yourself to the hospital. Summary  After an open hernia repair, it is common to have mild discomfort, slight bruising, and minor swelling.  Follow instructions from your health care provider about how to take care of your incision. Check every day for signs of infection.  Do not lift heavy objects or play contact sports until your health care provider says it is safe.  Return to your normal activities as told by your health care provider. Ask your health care provider what activities are safe for you. This information is not intended to replace advice given to you by your health care provider. Make sure you discuss any questions you have with your health care provider. Document Revised: 06/15/2020 Document Reviewed: 06/15/2020 Elsevier Patient Education  [place].  Bupivacaine Liposomal Suspension for Injection What is this medicine? BUPIVACAINE LIPOSOMAL (bue PIV a kane LIP oh som al) is an anesthetic. It causes loss of feeling in the skin or other tissues. It is used to prevent and to treat pain from some procedures. This medicine may be used for other purposes; ask your health care provider or pharmacist if you have questions. COMMON BRAND NAME(S): EXPAREL What should I tell my health care provider before I take this medicine? They need to know if you have any of these conditions:  G6PD deficiency  heart disease  kidney disease  liver disease  low blood pressure  lung or breathing disease, like asthma  an unusual or allergic reaction to bupivacaine, other medicines, foods, dyes, or preservatives  pregnant or trying to get  pregnant  breast-feeding How should I use this medicine? This medicine is injected into the affected area. It is given by a health care provider in a hospital or clinic setting. Talk to your health care provider about the use of this medicine in children. While it may be given to children as young as 6 years for selected conditions, precautions do apply. Overdosage: If you think you have taken too much of this medicine contact a poison control center or emergency room at once. NOTE: This medicine is only for you. Do not share this medicine with others. What if I miss a dose? This does not apply. What may interact with this medicine? This medicine may interact with the following medications:  acetaminophen  certain antibiotics like dapsone, nitrofurantoin, aminosalicylic acid, sulfonamides  certain medicines for seizures like phenobarbital, phenytoin, valproic acid  chloroquine  cyclophosphamide  flutamide  hydroxyurea  ifosfamide  metoclopramide  nitric oxide  nitroglycerin  nitroprusside  nitrous oxide  other local anesthetics like lidocaine, pramoxine, tetracaine  primaquine  quinine  rasburicase  sulfasalazine This list may not describe all possible interactions. Give your health care provider a list of all the medicines, herbs, non-prescription drugs, or dietary supplements you use. Also tell them if you smoke, drink alcohol, or use illegal drugs. Some items may interact with your medicine. What should I watch for while using this medicine? Your condition will be monitored carefully while you are receiving this medicine. Be careful to avoid injury while the area is numb, and you are not aware of pain. What side effects may I notice from receiving this medicine? Side effects that you should report to your doctor or health care professional as soon as possible:  allergic reactions like skin rash, itching or hives, swelling of the face, lips, or  tongue  seizures  signs and symptoms of a dangerous change in heartbeat or heart rhythm like chest pain; dizziness; fast, irregular heartbeat; palpitations; feeling faint or lightheaded; falls; breathing problems  signs and symptoms of methemoglobinemia such as pale, gray, or blue colored skin; headache; fast heartbeat; shortness of breath; feeling faint or lightheaded, falls; tiredness Side effects that usually do not require medical attention (report to your doctor or health care professional if they continue or are bothersome):  anxious  back pain  changes in taste  changes in vision  constipation  dizziness  fever  nausea, vomiting This list may not describe all possible side effects. Call your doctor for medical advice about side effects. You may report side effects to FDA at 1-800-FDA-1088. Where should I keep my medicine? This drug is given in a hospital or clinic and will not be stored at home. NOTE: This sheet is a summary. It may not cover all possible information. If you have questions about this medicine, talk to your doctor, pharmacist, or health care provider.  2021 Elsevier/Gold Standard (2020-02-06 12:24:57)

## 2021-04-06 NOTE — Anesthesia Preprocedure Evaluation (Addendum)
Anesthesia Evaluation  Patient identified by MRN, date of birth, ID band Patient awake    Reviewed: Allergy & Precautions, NPO status , Patient's Chart, lab work & pertinent test results  History of Anesthesia Complications Negative for: history of anesthetic complications  Airway Mallampati: I  TM Distance: >3 FB Neck ROM: Full    Dental  (+) Dental Advisory Given, Teeth Intact   Pulmonary Patient abstained from smoking., former smoker,    Pulmonary exam normal breath sounds clear to auscultation       Cardiovascular Exercise Tolerance: Good hypertension (gestational ), Normal cardiovascular exam Rhythm:Regular Rate:Normal     Neuro/Psych  Headaches, PSYCHIATRIC DISORDERS Anxiety Depression Bipolar Disorder    GI/Hepatic Neg liver ROS, GERD  Medicated,  Endo/Other  negative endocrine ROS  Renal/GU negative Renal ROS     Musculoskeletal negative musculoskeletal ROS (+)   Abdominal   Peds  (+) ADHD Hematology negative hematology ROS (+)   Anesthesia Other Findings   Reproductive/Obstetrics negative OB ROS                           Anesthesia Physical Anesthesia Plan  ASA: II  Anesthesia Plan: General   Post-op Pain Management:    Induction: Intravenous  PONV Risk Score and Plan: 4 or greater and Ondansetron, Dexamethasone and Midazolam  Airway Management Planned: Oral ETT  Additional Equipment:   Intra-op Plan:   Post-operative Plan: Extubation in OR  Informed Consent: I have reviewed the patients History and Physical, chart, labs and discussed the procedure including the risks, benefits and alternatives for the proposed anesthesia with the patient or authorized representative who has indicated his/her understanding and acceptance.     Dental advisory given  Plan Discussed with: CRNA and Surgeon  Anesthesia Plan Comments:         Anesthesia Quick Evaluation

## 2021-04-09 ENCOUNTER — Ambulatory Visit: Payer: 59

## 2021-04-13 ENCOUNTER — Other Ambulatory Visit (INDEPENDENT_AMBULATORY_CARE_PROVIDER_SITE_OTHER): Payer: Self-pay | Admitting: Internal Medicine

## 2021-04-16 ENCOUNTER — Ambulatory Visit (HOSPITAL_COMMUNITY)
Admission: RE | Admit: 2021-04-16 | Discharge: 2021-04-16 | Disposition: A | Discharge status: Home / Self Care | Payer: 59 | Source: Ambulatory Visit | Attending: Internal Medicine | Admitting: Internal Medicine

## 2021-04-16 ENCOUNTER — Telehealth (INDEPENDENT_AMBULATORY_CARE_PROVIDER_SITE_OTHER): Payer: Self-pay | Admitting: General Surgery

## 2021-04-16 ENCOUNTER — Other Ambulatory Visit: Payer: Self-pay

## 2021-04-16 DIAGNOSIS — R937 Abnormal findings on diagnostic imaging of other parts of musculoskeletal system: Secondary | ICD-10-CM | POA: Insufficient documentation

## 2021-04-16 DIAGNOSIS — Z09 Encounter for follow-up examination after completed treatment for conditions other than malignant neoplasm: Secondary | ICD-10-CM

## 2021-04-16 DIAGNOSIS — M5134 Other intervertebral disc degeneration, thoracic region: Secondary | ICD-10-CM | POA: Diagnosis not present

## 2021-04-16 IMAGING — MR MR THORACIC SPINE W/ CM
4 of 6 series · 25 of 48 positions shown · IV contrast (agent unspecified)
Comparison: [DATE]

CLINICAL DATA: Intervertebral disc disorder. Follow-up abnormal
scan [DATE]

EXAM:
MRI THORACIC SPINE WITH CONTRAST
TECHNIQUE: Multiplanar, multisequence MR imaging of the thoracic spine was
performed following the administration of intravenous contrast.

[Series 18: T1 · sagittal · 6.0mm · 1.37mm/px · 4 of 8 slices shown (1 of 3)]
[im 1/8]
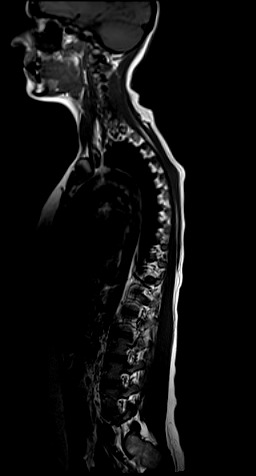
[im 3/8]
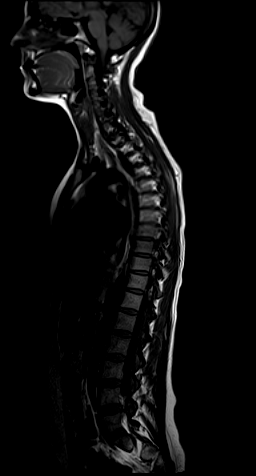
[im 5/8]
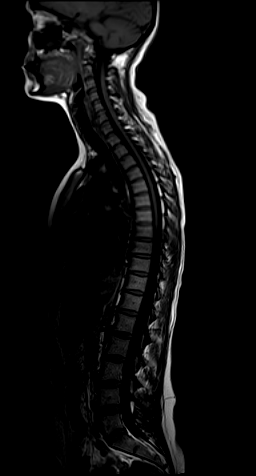
[im 8/8]
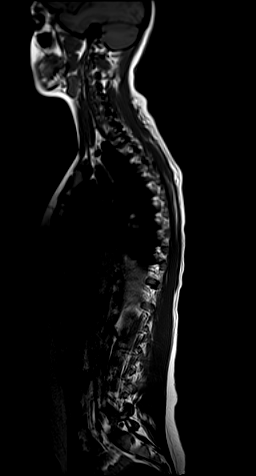

[Series 19: T1 · sagittal · 3.0mm · 0.74mm/px · 6 of 17 slices shown (2 of 3)]
[im 1/17]
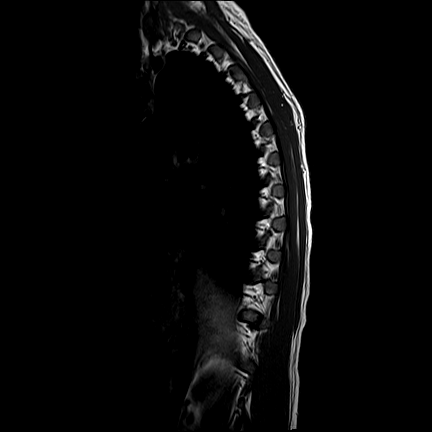
[im 4/17]
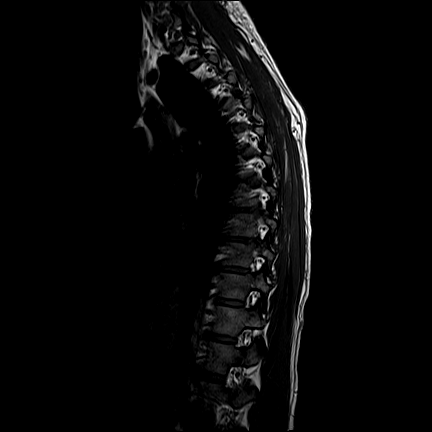
[im 7/17]
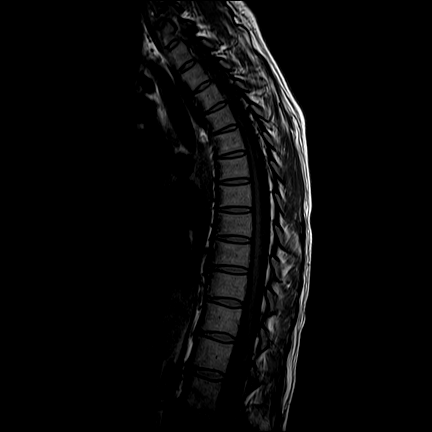
[im 10/17]
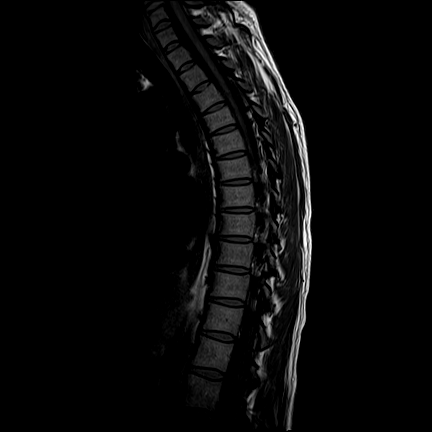
[im 13/17]
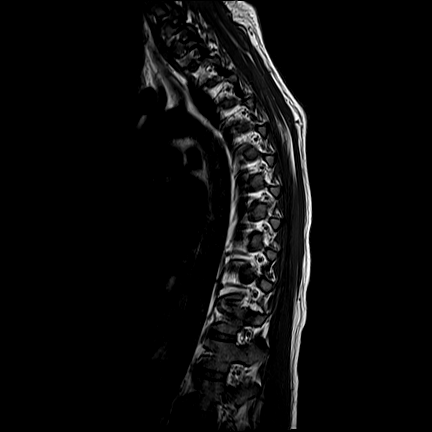
[im 17/17]
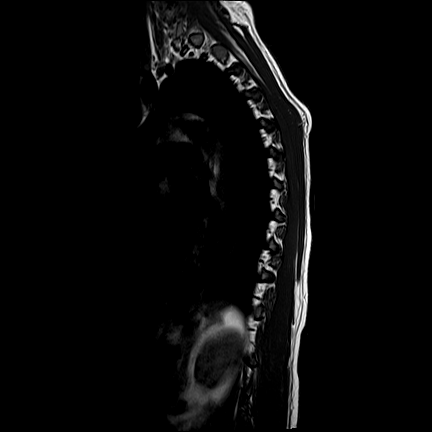

[Series 21: T1 · axial · non-contrast · 4.0mm · 0.37mm/px · z∈[-232,-51]mm · 9 of 36 slices shown (3 of 3)]
[im 1/36]
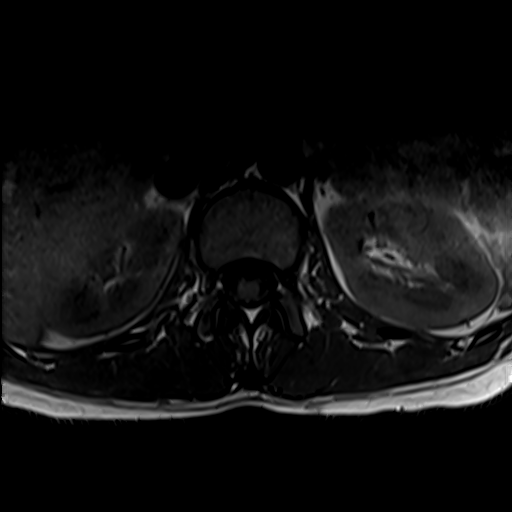
[im 6/36]
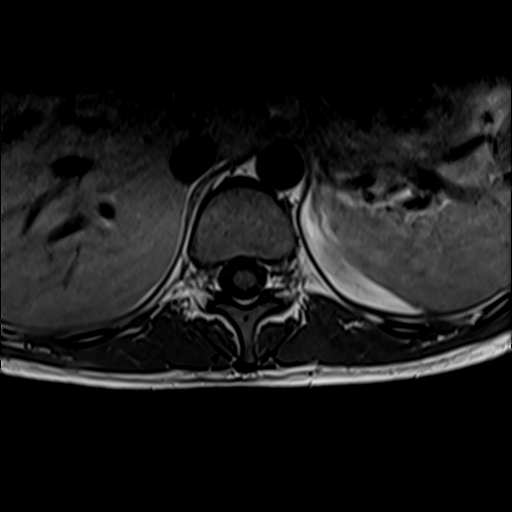
[im 12/36]
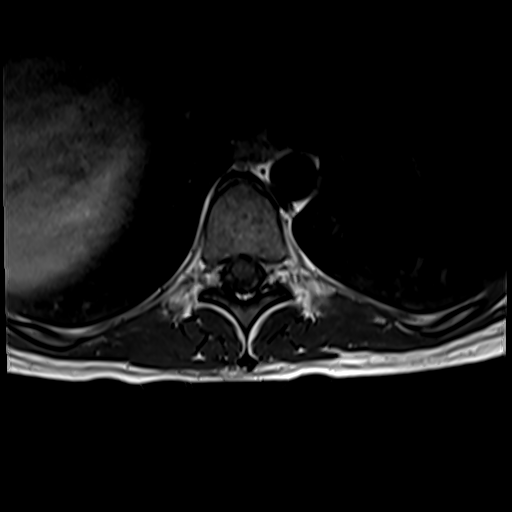
[im 15/36]
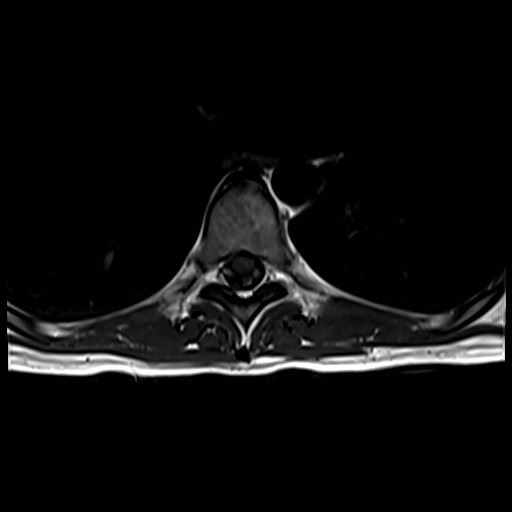
[im 18/36]
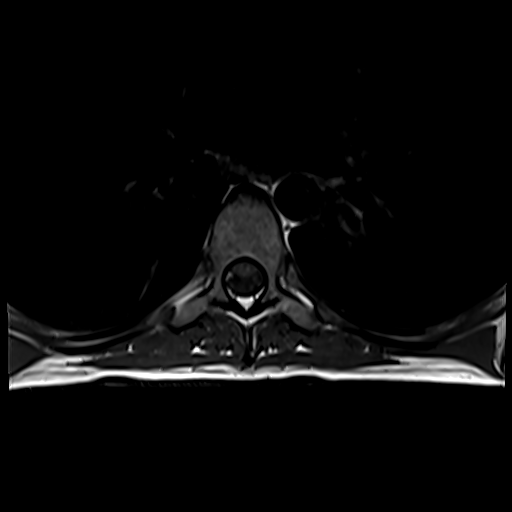
[im 21/36]
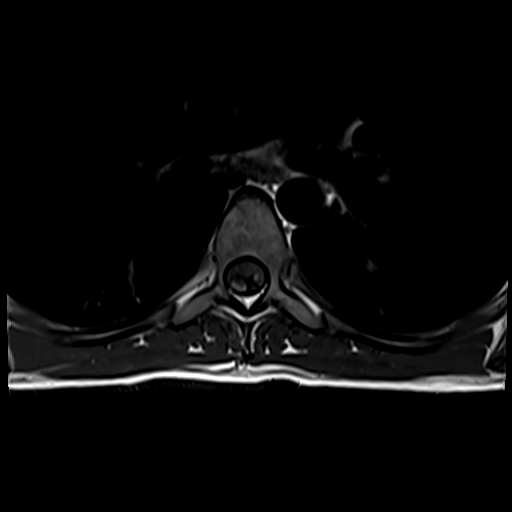
[im 24/36]
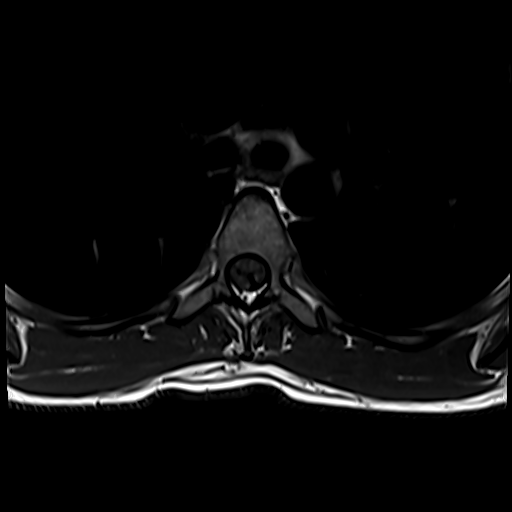
[im 30/36]
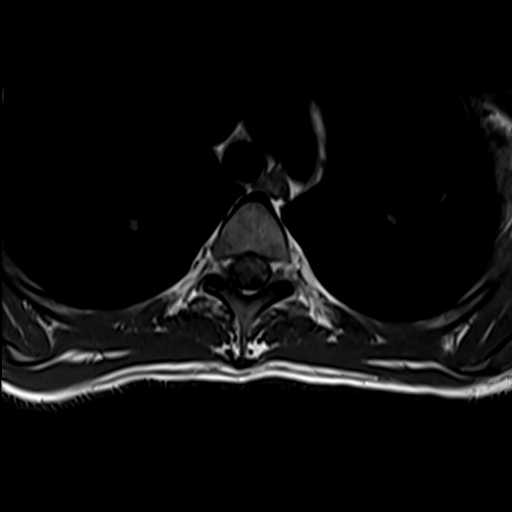
[im 36/36]
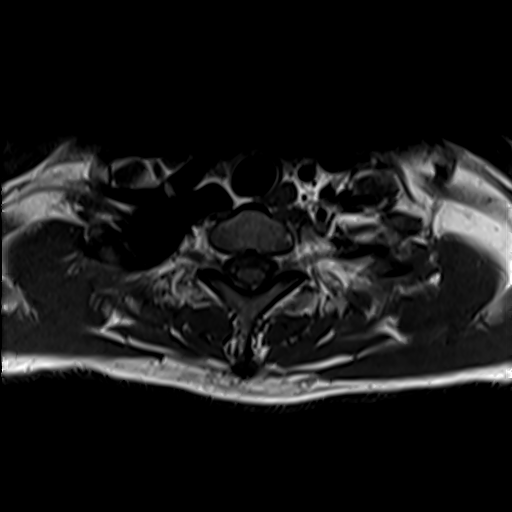

[Series 24: T1 fat-sat post-contrast · sagittal · 3.0mm · 0.77mm/px · 6 of 17 slices shown]
[im 1/17]
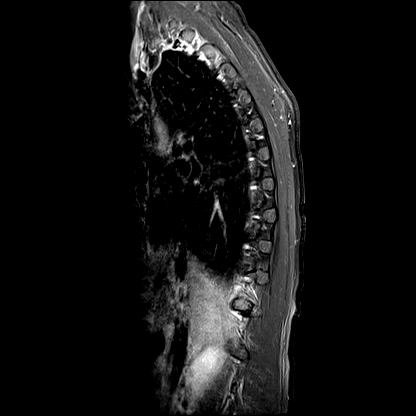
[im 4/17]
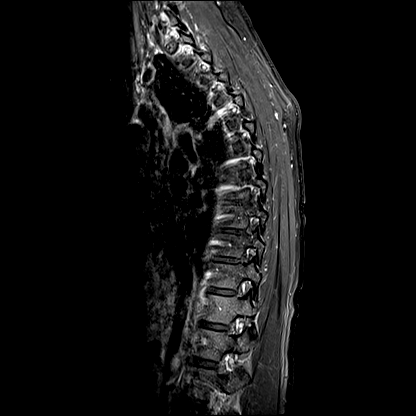
[im 7/17]
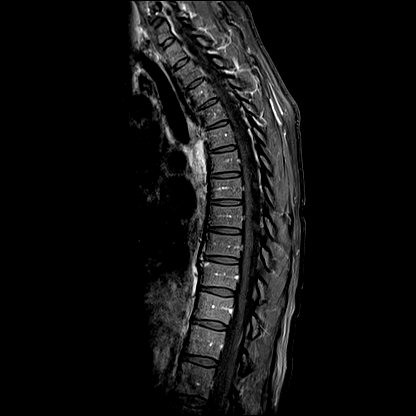
[im 10/17]
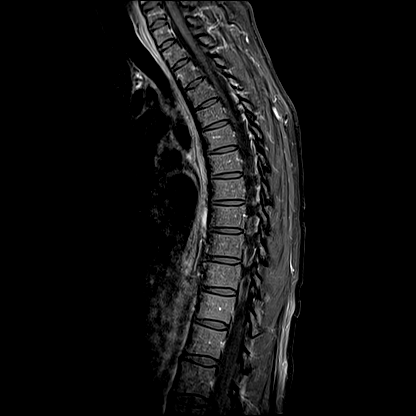
[im 13/17]
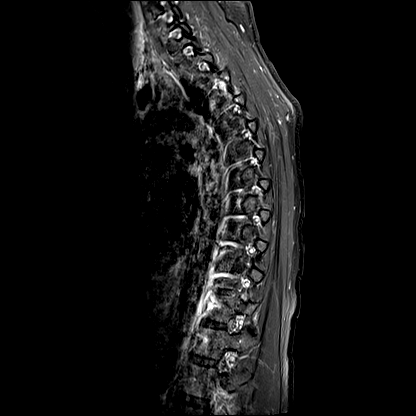
[im 17/17]
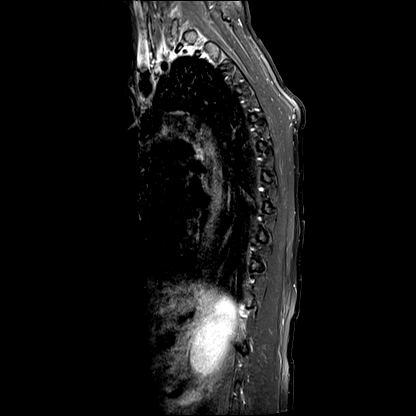

[25 of 48 positions shown; findings below may reference images not displayed]

FINDINGS: Alignment:  Normal

Vertebrae: The areas of STIR hyperintensity at the level of the T2
body and the left posterior elements of T3 and T4 all enhance
similarly, a nonspecific finding. These do not appear dark on T1
weighted imaging and have an overall benign appearance, especially
in a patient with no malignancy history. At the T3 left pedicle on
sagittal T1 weighted imaging there is suggestion of some shortening
and fat content. Atypical hemangiomas are favored.

Cord:  Normal

Paraspinal and other soft tissues: No perispinal mass or
inflammation.

Disc levels:

Negative
IMPRESSION: Small bone lesions in the upper thoracic spine have a non-worrisome
appearance in the absence of malignancy history, likely hemangiomas.

## 2021-04-16 MED ORDER — GADOBUTROL 1 MMOL/ML IV SOLN
6.0000 mL | Freq: Once | INTRAVENOUS | Status: AC | PRN
  Administered 2021-04-16: 6 mL via INTRAVENOUS

## 2021-04-16 NOTE — Telephone Encounter (Signed)
Virtual postoperative telephone visit performed.  Patient states she is doing well.  She has no complaints.  She is pleased with the results.  Her incision is healing well.  She has mostly returned to normal activity.  She is still avoiding lifting heavy material.  She was instructed to call my office should any problems arise.  At this virtual visit was a part of the global surgical fee, this was not a billable visit.  Total telephone time 2 minutes.

## 2021-04-20 ENCOUNTER — Ambulatory Visit (INDEPENDENT_AMBULATORY_CARE_PROVIDER_SITE_OTHER): Payer: 59 | Admitting: Mental Health

## 2021-04-20 ENCOUNTER — Other Ambulatory Visit: Payer: Self-pay

## 2021-04-20 DIAGNOSIS — F422 Mixed obsessional thoughts and acts: Secondary | ICD-10-CM
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
## 2021-04-28 ENCOUNTER — Encounter (INDEPENDENT_AMBULATORY_CARE_PROVIDER_SITE_OTHER): Payer: Self-pay

## 2021-04-28 ENCOUNTER — Ambulatory Visit (INDEPENDENT_AMBULATORY_CARE_PROVIDER_SITE_OTHER): Payer: Medicaid Other | Admitting: Internal Medicine

## 2021-04-28 DIAGNOSIS — N951 Menopausal and female climacteric states: Secondary | ICD-10-CM | POA: Diagnosis not present

## 2021-04-28 DIAGNOSIS — R635 Abnormal weight gain: Secondary | ICD-10-CM | POA: Diagnosis not present

## 2021-05-04 ENCOUNTER — Other Ambulatory Visit (HOSPITAL_COMMUNITY): Payer: Self-pay

## 2021-05-04 MED ORDER — METHOCARBAMOL 500 MG PO TABS
500.0000 mg | ORAL_TABLET | Freq: Four times a day (QID) | ORAL | 1 refills | Status: DC | PRN
  Filled 2021-05-04: qty 60, 15d supply, fill #0

## 2021-05-05 DIAGNOSIS — N951 Menopausal and female climacteric states: Secondary | ICD-10-CM | POA: Diagnosis not present

## 2021-05-05 DIAGNOSIS — F4310 Post-traumatic stress disorder, unspecified: Secondary | ICD-10-CM | POA: Diagnosis not present

## 2021-05-05 DIAGNOSIS — R635 Abnormal weight gain: Secondary | ICD-10-CM | POA: Diagnosis not present

## 2021-05-05 DIAGNOSIS — Z6823 Body mass index (BMI) 23.0-23.9, adult: Secondary | ICD-10-CM | POA: Diagnosis not present

## 2021-05-05 DIAGNOSIS — F331 Major depressive disorder, recurrent, moderate: Secondary | ICD-10-CM | POA: Diagnosis not present

## 2021-05-05 DIAGNOSIS — Z1331 Encounter for screening for depression: Secondary | ICD-10-CM | POA: Diagnosis not present

## 2021-05-05 DIAGNOSIS — L680 Hirsutism: Secondary | ICD-10-CM | POA: Diagnosis not present

## 2021-05-05 DIAGNOSIS — Z1339 Encounter for screening examination for other mental health and behavioral disorders: Secondary | ICD-10-CM | POA: Diagnosis not present

## 2021-05-05 DIAGNOSIS — F3162 Bipolar disorder, current episode mixed, moderate: Secondary | ICD-10-CM | POA: Diagnosis not present

## 2021-05-28 ENCOUNTER — Encounter: Payer: Self-pay | Admitting: Adult Health

## 2021-05-28 ENCOUNTER — Ambulatory Visit (INDEPENDENT_AMBULATORY_CARE_PROVIDER_SITE_OTHER): Payer: 59 | Admitting: Adult Health

## 2021-05-28 ENCOUNTER — Other Ambulatory Visit: Payer: Self-pay

## 2021-05-28 DIAGNOSIS — F909 Attention-deficit hyperactivity disorder, unspecified type: Secondary | ICD-10-CM | POA: Diagnosis not present

## 2021-05-28 DIAGNOSIS — F411 Generalized anxiety disorder: Secondary | ICD-10-CM | POA: Diagnosis not present

## 2021-05-28 DIAGNOSIS — F422 Mixed obsessional thoughts and acts: Secondary | ICD-10-CM

## 2021-05-28 DIAGNOSIS — F331 Major depressive disorder, recurrent, moderate: Secondary | ICD-10-CM | POA: Diagnosis not present

## 2021-05-28 MED ORDER — ARIPIPRAZOLE 5 MG PO TABS: ORAL_TABLET | ORAL | 2 refills | Status: DC

## 2021-05-28 MED ORDER — AMPHETAMINE-DEXTROAMPHETAMINE 10 MG PO TABS: 10.0000 mg | ORAL_TABLET | Freq: Every day | ORAL | 0 refills | Status: DC

## 2021-05-28 MED ORDER — AMPHETAMINE-DEXTROAMPHET ER 20 MG PO CP24: 20.0000 mg | ORAL_CAPSULE | Freq: Every day | ORAL | 0 refills | Status: DC

## 2021-05-28 MED ORDER — SERTRALINE HCL 100 MG PO TABS: ORAL_TABLET | ORAL | 2 refills | Status: DC

## 2021-05-28 NOTE — Progress Notes (Signed)
Cristina Davenport [id number] 07-Feb-1986 35 y.o.  Subjective:   Patient ID:  Cristina Davenport is a 35 y.o. (DOB 12-01-1985) female.  Chief Complaint: No chief complaint on file.   HPI Cristina Davenport presents to the office today for follow-up of ADHD, MDD, GAD, and obsessional thoughts and acts.   Describes mood today as "better". Pleasant. Mood symptoms - reports depression, anxiety, and irritability - lacking sleep. Still worrying and obsessing over household. Mood instability with situational stressors. Needing to have and stay on a routine. Stating "I feel pissed off or sad all the time". Had an argument with daughter over the 4th of July and she has not returned since then - staying with her father for now.  Stating "I just want to be normal". Decreased interest and motivation. Taking medications as prescribed.  Energy levels lower. Active, does not have a regular exercise routine.  Unable to enjoy usual interests and activities. Married. Lives with husband of 2 years, together 10 years. Has 3 children - 12, 3, and 1. Family 40 minutes away. Spending time with family. Appetite adequate. Weight stable - 128.4  pounds - 61". Sleeps well most nights. Averages 5 to 6 hours. Napping during the day over the past week. Focus and concentration stable with Adderall. Completing tasks. Managing aspects of household. 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  Denies AH or VH.  Previous medication trials: Zoloft, Hydroxyzine, Adderall XR 20mg  daily   [place] Office Visit from 10/28/2020 in [place] from 06/19/2020 in [place] from 03/06/2020 in [place] Visit from 08/17/2018 in [place] Visit from 05/21/2018 in [place]  Total GAD-7 Score 19 17 21 11 20       PHQ2-9    [place] Office Visit from 01/19/2021 in [place] Visit from 10/28/2020 in  [place] from 06/19/2020 in [place] from 03/06/2020 in [place] Initial Prenatal from 05/10/2019 in [facility]  PHQ-2 Total Score 6 1 1  0 0  PHQ-9 Total Score 18 7 8 15 4       Flowsheet Row Admission (Discharged) from 04/06/2021 in [place] 45 from 04/02/2021 in [place] ED from 03/25/2021 in [place] No Risk No Risk Error: Question 6 not populated        Review of Systems:  Review of Systems  Musculoskeletal:  Negative for gait problem.  Neurological:  Negative for tremors.  Psychiatric/Behavioral:         Please refer to HPI   Medications: I have reviewed the patient's current medications.  Current Outpatient Medications  Medication Sig Dispense Refill   ARIPiprazole (ABILIFY) 5 MG tablet Take 1/2 tablet daily for 7 days, then take one tablet daily. 30 tablet 2   acetaminophen (TYLENOL) 500 MG tablet Take 500 mg by mouth 2 (two) times daily as needed for moderate pain.     amphetamine-dextroamphetamine (ADDERALL XR) 20 MG 24 hr capsule Take 1 capsule (20 mg total) by mouth daily. 30 capsule 0   amphetamine-dextroamphetamine (ADDERALL) 10 MG tablet Take 1 tablet (10 mg total) by mouth daily. 30 tablet 0   Apple Cider Vinegar 500 MG TABS Take 500 mg by mouth at bedtime.     AZO-CRANBERRY PO Take 1 tablet by mouth at bedtime.  Cholecalciferol (VITAMIN D3) 50 MCG (2000 UT) TABS Take 2,000 mg by mouth at bedtime.     Cyanocobalamin (B-12) 5000 MCG CAPS Take 5,000 mcg by mouth daily.     docusate sodium (COLACE) 100 MG capsule Take 100 mg by mouth at bedtime.     Ferrous Sulfate (IRON) 325 (65 Fe) MG TABS Take 325 mg by mouth at bedtime.     HYDROcodone-acetaminophen (NORCO) 5-325 MG tablet Take 1 tablet by mouth every 4 (four) hours as needed for moderate pain. 30  tablet 0   levonorgestrel (LILETTA, 52 MG,) 19.5 MCG/DAY IUD IUD 1 each by Intrauterine route once.     Lysine 1000 MG TABS Take 1,000 mg by mouth at bedtime.     Magnesium 400 MG TABS Take 400 mg by mouth daily.     Melatonin 5 MG CAPS Take 5 mg by mouth at bedtime as needed (sleep).     methocarbamol (ROBAXIN) 500 MG tablet Take 1 tablet (500 mg total) by mouth every 6 (six) hours as needed for muscle tension/spasms in the daytime 60 tablet 1   NP THYROID 60 MG tablet Take 1 tablet (60 mg total) by mouth daily before breakfast. 30 tablet 3   progesterone (PROMETRIUM) 200 MG capsule Take 1 capsule (200 mg total) by mouth at bedtime. 90 capsule 1   sertraline (ZOLOFT) 100 MG tablet Take two tablets daily. 60 tablet 2   No current facility-administered medications for this visit.    Medication Side Effects: None  Allergies:  Allergies  Allergen Reactions   Latex Other (See Comments)    General irritation   Doxycycline Rash    Solar rash    Past Medical History:  Diagnosis Date   Anxiety    Anxiety    Bipolar 1 disorder ([place])    Family history of adverse reaction to anesthesia    GERD (gastroesophageal reflux disease)    Hypertension    PIH   IBS (irritable bowel syndrome)    Migraine without aura     Past Medical History, Surgical history, Social history, and Family history were reviewed and updated as appropriate.   Please see review of systems for further details on the patient's review from today.   Objective:   Physical Exam:  There were no vitals taken for this visit.  Physical Exam Constitutional:      General: She is not in acute distress. Musculoskeletal:        General: No deformity.  Neurological:     Mental Status: She is alert and oriented to person, place, and time.     Coordination: Coordination normal.  Psychiatric:        Attention and Perception: Attention and perception normal. She does not perceive auditory or visual hallucinations.         Mood and Affect: Mood normal. Mood is not anxious or depressed. Affect is not labile, blunt, angry or inappropriate.        Speech: Speech normal.        Behavior: Behavior normal.        Thought Content: Thought content normal. Thought content is not paranoid or delusional. Thought content does not include homicidal or suicidal ideation. Thought content does not include homicidal or suicidal plan.        Cognition and Memory: Cognition and memory normal.        Judgment: Judgment normal.     Comments: Insight intact    Lab Review:     Component Value  Date/Time   NA 139 03/28/2021 1131   NA 137 05/10/2019 1138   K 3.7 03/28/2021 1131   CL 105 03/28/2021 1131   CO2 27 03/28/2021 1131   GLUCOSE 100 (H) 03/28/2021 1131   BUN 7 03/28/2021 1131   BUN 8 05/10/2019 1138   CREATININE 0.72 03/28/2021 1131   CALCIUM 9.3 03/28/2021 1131   PROT 7.0 03/28/2021 1131   PROT 6.9 05/10/2019 1138   ALBUMIN 4.3 03/28/2021 1131   ALBUMIN 4.6 05/10/2019 1138   AST 16 03/28/2021 1131   ALT 13 03/28/2021 1131   ALKPHOS 42 03/28/2021 1131   BILITOT 0.6 03/28/2021 1131   BILITOT 0.3 05/10/2019 1138   GFRNONAA >60 03/28/2021 1131   GFRAA >60 10/15/2019 0909       Component Value Date/Time   WBC 6.5 03/28/2021 1131   RBC 4.84 03/28/2021 1131   HGB 14.5 03/28/2021 1131   HGB 12.4 08/28/2019 0919   HGB 13.3 09/23/2015 1638   HCT 43.9 03/28/2021 1131   HCT 37.8 08/28/2019 0919   PLT 292 03/28/2021 1131   PLT 226 08/28/2019 0919   MCV 90.7 03/28/2021 1131   MCV 89 08/28/2019 0919   MCH 30.0 03/28/2021 1131   MCHC 33.0 03/28/2021 1131   RDW 12.3 03/28/2021 1131   RDW 12.4 08/28/2019 0919   LYMPHSABS 2.2 03/28/2021 1131   LYMPHSABS 2.0 05/10/2019 1445   MONOABS 0.4 03/28/2021 1131   EOSABS 0.2 03/28/2021 1131   EOSABS 0.1 05/10/2019 1445   BASOSABS 0.0 03/28/2021 1131   BASOSABS 0.0 05/10/2019 1445    No results found for: POCLITH, LITHIUM   No results found for: PHENYTOIN, PHENOBARB,  VALPROATE, CBMZ   .res Assessment: Plan:    Plan:  PDMP reviewed  1. Adderall XR 20mg  daily 2. Zoloft 200mg  daily 3. Adderall 10mg  daily 4. Add Abilify 5mg  - 1/2 tablet daily for 7 days, then increase to 5mg  daily.  114/71/79  Plans see a therapist  Read and reviewed note with patient for accuracy.   RTC 4 weeks  Patient advised to contact office with any questions, adverse effects, or acute worsening in signs and symptoms.  Discussed potential benefits, risks, and side effects of stimulants with patient to include increased heart rate, palpitations, insomnia, increased anxiety, increased irritability, or decreased appetite.  Instructed patient to contact office if experiencing any significant tolerability issues.   Diagnoses and all orders for this visit:  Mixed obsessional thoughts and acts -     ARIPiprazole (ABILIFY) 5 MG tablet; Take 1/2 tablet daily for 7 days, then take one tablet daily.  Attention deficit hyperactivity disorder (ADHD), unspecified ADHD type -     amphetamine-dextroamphetamine (ADDERALL) 10 MG tablet; Take 1 tablet (10 mg total) by mouth daily. -     amphetamine-dextroamphetamine (ADDERALL XR) 20 MG 24 hr capsule; Take 1 capsule (20 mg total) by mouth daily.  Major depressive disorder, recurrent episode, moderate (HCC) -     sertraline (ZOLOFT) 100 MG tablet; Take two tablets daily. -     ARIPiprazole (ABILIFY) 5 MG tablet; Take 1/2 tablet daily for 7 days, then take one tablet daily.  Generalized anxiety disorder -     sertraline (ZOLOFT) 100 MG tablet; Take two tablets daily. -     ARIPiprazole (ABILIFY) 5 MG tablet; Take 1/2 tablet daily for 7 days, then take one tablet daily.    Please see After Visit Summary for patient specific instructions.  Future Appointments  Date Time Provider [place]  06/07/2021 10:00 AM [place], [facility] CP-CP None  06/22/2021 10:00 AM [place], [facility] CP-CP None  07/06/2021 10:00 AM  [place], [facility] CP-CP None    No orders of the defined types were placed in this encounter.   -------------------------------

## 2021-06-01 ENCOUNTER — Ambulatory Visit: Payer: 59 | Admitting: Mental Health

## 2021-06-01 ENCOUNTER — Other Ambulatory Visit: Payer: Self-pay | Admitting: Neurosurgery

## 2021-06-01 DIAGNOSIS — D492 Neoplasm of unspecified behavior of bone, soft tissue, and skin: Secondary | ICD-10-CM

## 2021-06-07 ENCOUNTER — Other Ambulatory Visit: Payer: Self-pay

## 2021-06-07 ENCOUNTER — Ambulatory Visit (INDEPENDENT_AMBULATORY_CARE_PROVIDER_SITE_OTHER): Payer: 59 | Admitting: Mental Health

## 2021-06-07 DIAGNOSIS — F422 Mixed obsessional thoughts and acts: Secondary | ICD-10-CM
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
## 2021-06-16 DIAGNOSIS — F332 Major depressive disorder, recurrent severe without psychotic features: Secondary | ICD-10-CM | POA: Diagnosis not present

## 2021-06-16 DIAGNOSIS — F411 Generalized anxiety disorder: Secondary | ICD-10-CM | POA: Diagnosis not present

## 2021-06-22 ENCOUNTER — Ambulatory Visit (INDEPENDENT_AMBULATORY_CARE_PROVIDER_SITE_OTHER): Payer: 59 | Admitting: Mental Health

## 2021-06-22 ENCOUNTER — Other Ambulatory Visit: Payer: Self-pay

## 2021-06-22 ENCOUNTER — Other Ambulatory Visit: Payer: Self-pay | Admitting: Adult Health

## 2021-06-22 DIAGNOSIS — F411 Generalized anxiety disorder: Secondary | ICD-10-CM

## 2021-06-22 DIAGNOSIS — F422 Mixed obsessional thoughts and acts: Secondary | ICD-10-CM

## 2021-06-22 DIAGNOSIS — F331 Major depressive disorder, recurrent, moderate: Secondary | ICD-10-CM

## 2021-06-22 NOTE — Progress Notes (Signed)
[facility] Psychotherapy Note  Name: Cristina Davenport Date: 06/28/2021 MRN: [id number] DOB: 11-29-1985 PCP: Doree Albee, MD (Inactive)  Time spent: 50 minutes  Treatment: Individual therapy   Mental Status Exam:    Appearance:    Casual     Behavior:   Appropriate  Motor:   WNL  Speech/Language:    Clear and Coherent  Affect:   Full range   Mood:   Euthymic  Thought process:   normal  Thought content:     WNL  Sensory/Perceptual disturbances:     none  Orientation:   x4  Attention:   Good  Concentration:   Good  Memory:   Intact  Fund of knowledge:    Consistent with age and development  Insight:     Good  Judgment:    Good  Impulse Control:   Good    Reported Symptoms:  Sad and anxious feelings, irritability  Risk Assessment: Danger to Self:  No Self-injurious Behavior: No Danger to Others: No Duty to Warn:no Physical Aggression / Violence:No  Patient / guardian was educated about steps to take if suicide or homicide risk level increases between visits:  yes While future psychiatric events cannot be accurately predicted, the patient does not currently require acute inpatient psychiatric care and does not currently meet [facility] involuntary commitment criteria.     Subjective: Patient share recent events, progress. She said that she continues to have marital strain, going on to provide some details. She said that she meets with a financial advisor weekly, who is also their pastor. She said that her husband continues insist on they're having separate bank accounts, that he will make purchases for himself and pay many household bills however, she shared how she pays for many needs such as food, clothing for the children, activities etc. She wants to have a joint account with him however, he will not allow it. She continues to identify needs, such as his helping out more around the house. She shared how she works full-time as he does, however, she is  expected to manage the house and many of the children's names primarily. She stated that he has been able to watch them if she has appointments at times more recently she's been somewhat helpful. She shared more experiences related to support, having a friend for many years that is highly supportive, as well as some of her family members. We discussed a potential benefits of their engaging in marital counseling where she stated that she plans to talk with her husband about this possibility.    Interventions: Supportive therapy, mot. interviewing   Diagnoses:    ICD-10-CM   1. Mixed obsessional thoughts and acts  F42.2         Plan: Patient to identify outlets for stress mgmt. Patient to verbalize need for more help w/ household tasks and w/ the children from her husband  Long-term goal:   Improve feelings of self-worth / image Decrease tendency to "shut down" when upset Fully process and move past the pain suffered during childhood   Short-term goal:  Decrease "deflating, self-doubting" and catastrophizing thinking style Improve effective communication and coping when upset or agitated Decrease tendency to attempt to self validate by comparing herself to others    Assessment of progress:  progressing   This record has been created using [employer].  Chart creation errors have been sought, but may not always have been located and corrected. Such creation errors do not reflect on  the standard of medical care.    Anson [place], [facility]

## 2021-06-23 DIAGNOSIS — F411 Generalized anxiety disorder: Secondary | ICD-10-CM | POA: Diagnosis not present

## 2021-06-23 DIAGNOSIS — F4312 Post-traumatic stress disorder, chronic: Secondary | ICD-10-CM | POA: Diagnosis not present

## 2021-06-23 DIAGNOSIS — F331 Major depressive disorder, recurrent, moderate: Secondary | ICD-10-CM | POA: Diagnosis not present

## 2021-06-23 DIAGNOSIS — F3131 Bipolar disorder, current episode depressed, mild: Secondary | ICD-10-CM | POA: Diagnosis not present

## 2021-06-29 ENCOUNTER — Ambulatory Visit: Payer: 59 | Admitting: Adult Health

## 2021-07-06 ENCOUNTER — Ambulatory Visit (INDEPENDENT_AMBULATORY_CARE_PROVIDER_SITE_OTHER): Payer: 59 | Admitting: Adult Health

## 2021-07-06 ENCOUNTER — Encounter: Payer: Self-pay | Admitting: Adult Health

## 2021-07-06 ENCOUNTER — Ambulatory Visit (INDEPENDENT_AMBULATORY_CARE_PROVIDER_SITE_OTHER): Payer: 59 | Admitting: Mental Health

## 2021-07-06 ENCOUNTER — Other Ambulatory Visit: Payer: Self-pay | Admitting: Adult Health

## 2021-07-06 ENCOUNTER — Other Ambulatory Visit: Payer: Self-pay

## 2021-07-06 DIAGNOSIS — F331 Major depressive disorder, recurrent, moderate: Secondary | ICD-10-CM

## 2021-07-06 DIAGNOSIS — F411 Generalized anxiety disorder: Secondary | ICD-10-CM

## 2021-07-06 DIAGNOSIS — F909 Attention-deficit hyperactivity disorder, unspecified type: Secondary | ICD-10-CM | POA: Diagnosis not present

## 2021-07-06 DIAGNOSIS — F422 Mixed obsessional thoughts and acts: Secondary | ICD-10-CM

## 2021-07-06 MED ORDER — ARIPIPRAZOLE 5 MG PO TABS: ORAL_TABLET | ORAL | 2 refills | Status: DC

## 2021-07-06 MED ORDER — AMPHETAMINE-DEXTROAMPHET ER 20 MG PO CP24: 20.0000 mg | ORAL_CAPSULE | Freq: Every day | ORAL | 0 refills | Status: DC

## 2021-07-06 MED ORDER — SERTRALINE HCL 100 MG PO TABS: ORAL_TABLET | ORAL | 2 refills | Status: DC

## 2021-07-06 MED ORDER — AMPHETAMINE-DEXTROAMPHETAMINE 10 MG PO TABS: 10.0000 mg | ORAL_TABLET | Freq: Every day | ORAL | 0 refills | Status: DC

## 2021-07-06 MED ORDER — CLONAZEPAM 0.5 MG PO TABS: 0.5000 mg | ORAL_TABLET | Freq: Every day | ORAL | 2 refills | Status: DC

## 2021-07-06 NOTE — Progress Notes (Signed)
Cristina Davenport [id number] 1986/11/12 35 y.o.  Subjective:   Patient ID:  Cristina Davenport is a 35 y.o. (DOB Sep 07, 1986) female.  Chief Complaint: No chief complaint on file.   HPI Cristina Davenport presents to the office today for follow-up of BPD, ADHD, MDD, GAD, and obsessional thoughts and acts.   Describes mood today as "about the same, no changes". Pleasant. Mood symptoms - denies depression. Feels anxious and irritable. Decreased worry and obsessing. Still wanting things to be in order. Mood instability with situational stressors.  She and daughter are doing better. Stating "I feel pissed off or sad all the time". Working with two therapist. Stable interest and motivation. Taking medications as prescribed.  Energy levels stable. Active, does not have a regular exercise routine.  Enjoys some usual interests and activities. Married. Lives with husband. Has 3 children - 12, 3, and 1. Family local.  Spending time with family. Appetite adequate. Weight stable - 128.4  pounds - 61". Sleeps well most nights. Averages 6 to 7 hours.   Focus and concentration stable with Adderall. Completing tasks. Managing aspects of household. 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  Denies AH or VH.  Previous medication trials: Zoloft, Hydroxyzine, Adderall XR '20mg'$  daily, Lamictal   GAD-7    Flowsheet Row Office Visit from 10/28/2020 in [place] from 06/19/2020 in [place] from 03/06/2020 in [place] Visit from 08/17/2018 in [place] Visit from 05/21/2018 in [place]  Total GAD-7 Score '19 17 21 11 20      '$ PHQ2-9    [place] Office Visit from 01/19/2021 in [place] Visit from 10/28/2020 in [place] from 06/19/2020 in [place] from 03/06/2020 in [place] Initial Prenatal from 05/10/2019  in [facility]  PHQ-2 Total Score '6 1 1 '$ 0 0  PHQ-9 Total Score '18 7 8 15 4      '$ Flowsheet Row Admission (Discharged) from 04/06/2021 in [place] [facility] from 04/02/2021 in [place] ED from 03/25/2021 in [place] No Risk No Risk Error: Question 6 not populated        Review of Systems:  Review of Systems  Musculoskeletal:  Negative for gait problem.  Neurological:  Negative for tremors.  Psychiatric/Behavioral:         Please refer to HPI   Medications: I have reviewed the patient's current medications.  Current Outpatient Medications  Medication Sig Dispense Refill   clonazePAM (KLONOPIN) 0.5 MG tablet Take 1 tablet (0.5 mg total) by mouth at bedtime. 30 tablet 2   acetaminophen (TYLENOL) 500 MG tablet Take 500 mg by mouth 2 (two) times daily as needed for moderate pain.     amphetamine-dextroamphetamine (ADDERALL XR) 20 MG 24 hr capsule Take 1 capsule (20 mg total) by mouth daily. 30 capsule 0   amphetamine-dextroamphetamine (ADDERALL) 10 MG tablet Take 1 tablet (10 mg total) by mouth daily. 30 tablet 0   Apple Cider Vinegar 500 MG TABS Take 500 mg by mouth at bedtime.     ARIPiprazole (ABILIFY) 5 MG tablet Take one and 1/2 tablets daily. 45 tablet 2   AZO-CRANBERRY PO Take 1 tablet by mouth at bedtime.     Cholecalciferol (VITAMIN D3) 50 MCG (2000 UT) TABS Take 2,000 mg by mouth at bedtime.  Cyanocobalamin (B-12) 5000 MCG CAPS Take 5,000 mcg by mouth daily.     docusate sodium (COLACE) 100 MG capsule Take 100 mg by mouth at bedtime.     Ferrous Sulfate (IRON) 325 (65 Fe) MG TABS Take 325 mg by mouth at bedtime.     HYDROcodone-acetaminophen (NORCO) 5-325 MG tablet Take 1 tablet by mouth every 4 (four) hours as needed for moderate pain. 30 tablet 0   levonorgestrel (LILETTA, 52 MG,) 19.5 MCG/DAY IUD IUD 1 each by Intrauterine route once.      Lysine 1000 MG TABS Take 1,000 mg by mouth at bedtime.     Magnesium 400 MG TABS Take 400 mg by mouth daily.     Melatonin 5 MG CAPS Take 5 mg by mouth at bedtime as needed (sleep).     methocarbamol (ROBAXIN) 500 MG tablet Take 1 tablet (500 mg total) by mouth every 6 (six) hours as needed for muscle tension/spasms in the daytime 60 tablet 1   NP THYROID 60 MG tablet Take 1 tablet (60 mg total) by mouth daily before breakfast. 30 tablet 3   progesterone (PROMETRIUM) 200 MG capsule Take 1 capsule (200 mg total) by mouth at bedtime. 90 capsule 1   sertraline (ZOLOFT) 100 MG tablet Take two tablets daily. 60 tablet 2   No current facility-administered medications for this visit.    Medication Side Effects: None  Allergies:  Allergies  Allergen Reactions   Latex Other (See Comments)    General irritation   Doxycycline Rash    Solar rash    Past Medical History:  Diagnosis Date   Anxiety    Anxiety    Bipolar 1 disorder ([place])    Family history of adverse reaction to anesthesia    GERD (gastroesophageal reflux disease)    Hypertension    PIH   IBS (irritable bowel syndrome)    Migraine without aura     Past Medical History, Surgical history, Social history, and Family history were reviewed and updated as appropriate.   Please see review of systems for further details on the patient's review from today.   Objective:   Physical Exam:  There were no vitals taken for this visit.  Physical Exam Constitutional:      General: She is not in acute distress. Musculoskeletal:        General: No deformity.  Neurological:     Mental Status: She is alert and oriented to person, place, and time.     Coordination: Coordination normal.  Psychiatric:        Attention and Perception: Attention and perception normal. She does not perceive auditory or visual hallucinations.        Mood and Affect: Mood normal. Mood is not anxious or depressed. Affect is not labile, blunt, angry or  inappropriate.        Speech: Speech normal.        Behavior: Behavior normal.        Thought Content: Thought content normal. Thought content is not paranoid or delusional. Thought content does not include homicidal or suicidal ideation. Thought content does not include homicidal or suicidal plan.        Cognition and Memory: Cognition and memory normal.        Judgment: Judgment normal.     Comments: Insight intact    Lab Review:     Component Value Date/Time   NA 139 03/28/2021 1131   NA 137 05/10/2019 1138   K 3.7 03/28/2021 1131  CL 105 03/28/2021 1131   CO2 27 03/28/2021 1131   GLUCOSE 100 (H) 03/28/2021 1131   BUN 7 03/28/2021 1131   BUN 8 05/10/2019 1138   CREATININE 0.72 03/28/2021 1131   CALCIUM 9.3 03/28/2021 1131   PROT 7.0 03/28/2021 1131   PROT 6.9 05/10/2019 1138   ALBUMIN 4.3 03/28/2021 1131   ALBUMIN 4.6 05/10/2019 1138   AST 16 03/28/2021 1131   ALT 13 03/28/2021 1131   ALKPHOS 42 03/28/2021 1131   BILITOT 0.6 03/28/2021 1131   BILITOT 0.3 05/10/2019 1138   GFRNONAA >60 03/28/2021 1131   GFRAA >60 10/15/2019 0909       Component Value Date/Time   WBC 6.5 03/28/2021 1131   RBC 4.84 03/28/2021 1131   HGB 14.5 03/28/2021 1131   HGB 12.4 08/28/2019 0919   HGB 13.3 09/23/2015 1638   HCT 43.9 03/28/2021 1131   HCT 37.8 08/28/2019 0919   PLT 292 03/28/2021 1131   PLT 226 08/28/2019 0919   MCV 90.7 03/28/2021 1131   MCV 89 08/28/2019 0919   MCH 30.0 03/28/2021 1131   MCHC 33.0 03/28/2021 1131   RDW 12.3 03/28/2021 1131   RDW 12.4 08/28/2019 0919   LYMPHSABS 2.2 03/28/2021 1131   LYMPHSABS 2.0 05/10/2019 1445   MONOABS 0.4 03/28/2021 1131   EOSABS 0.2 03/28/2021 1131   EOSABS 0.1 05/10/2019 1445   BASOSABS 0.0 03/28/2021 1131   BASOSABS 0.0 05/10/2019 1445    No results found for: POCLITH, LITHIUM   No results found for: PHENYTOIN, PHENOBARB, VALPROATE, CBMZ   .res Assessment: Plan:    Plan:  PDMP reviewed  1. Adderall XR '20mg'$   daily 2. Zoloft '200mg'$  daily 3. Adderall '10mg'$  daily 4. Increase Abilify '5mg'$  to 7.'5mg'$  daily. 5. Add Clonazepam 0.'5mg'$  daily  114/71/79  Plans see a therapist  Read and reviewed note with patient for accuracy.   RTC 4 weeks  Patient advised to contact office with any questions, adverse effects, or acute worsening in signs and symptoms.  Discussed potential benefits, risks, and side effects of stimulants with patient to include increased heart rate, palpitations, insomnia, increased anxiety, increased irritability, or decreased appetite.  Instructed patient to contact office if experiencing any significant tolerability issues. Diagnoses and all orders for this visit:  Mixed obsessional thoughts and acts -     ARIPiprazole (ABILIFY) 5 MG tablet; Take one and 1/2 tablets daily.  Attention deficit hyperactivity disorder (ADHD), unspecified ADHD type -     amphetamine-dextroamphetamine (ADDERALL) 10 MG tablet; Take 1 tablet (10 mg total) by mouth daily. -     amphetamine-dextroamphetamine (ADDERALL XR) 20 MG 24 hr capsule; Take 1 capsule (20 mg total) by mouth daily.  Major depressive disorder, recurrent episode, moderate (HCC) -     sertraline (ZOLOFT) 100 MG tablet; Take two tablets daily. -     ARIPiprazole (ABILIFY) 5 MG tablet; Take one and 1/2 tablets daily.  Generalized anxiety disorder -     sertraline (ZOLOFT) 100 MG tablet; Take two tablets daily. -     ARIPiprazole (ABILIFY) 5 MG tablet; Take one and 1/2 tablets daily. -     clonazePAM (KLONOPIN) 0.5 MG tablet; Take 1 tablet (0.5 mg total) by mouth at bedtime.    Please see After Visit Summary for patient specific instructions.  Future Appointments  Date Time Provider [place]  07/06/2021 10:00 AM Anson [place], [facility] CP-CP None  08/03/2021  8:20 AM Lekesha Claw, Berdie Ogren, NP CP-CP None  08/03/2021  9:00 AM  Anson [place], [facility] CP-CP None  08/17/2021  9:00 AM Anson [place], [facility] CP-CP None   08/31/2021  9:00 AM Anson Oregon, [facility] CP-CP None  09/14/2021  9:00 AM Anson [place], [facility] CP-CP None  09/28/2021  9:00 AM Anson [place], [facility] CP-CP None    No orders of the defined types were placed in this encounter.   -------------------------------

## 2021-07-06 NOTE — Progress Notes (Signed)
[facility] Psychotherapy Note  Name: Cristina Davenport Date: 07/06/2021 MRN: [id number] DOB: December 22, 1985 PCP: Doree Albee, MD (Inactive)  Time spent: 45 minutes  Treatment: Individual therapy   Mental Status Exam:    Appearance:    Casual     Behavior:   Appropriate  Motor:   WNL  Speech/Language:    Clear and Coherent  Affect:   Full range   Mood:   Euthymic  Thought process:   normal  Thought content:     WNL  Sensory/Perceptual disturbances:     none  Orientation:   x4  Attention:   Good  Concentration:   Good  Memory:   Intact  Fund of knowledge:    Consistent with age and development  Insight:     Good  Judgment:    Good  Impulse Control:   Good    Reported Symptoms:  Sad and anxious feelings, irritability  Risk Assessment: Danger to Self:  No Self-injurious Behavior: No Danger to Others: No Duty to Warn:no Physical Aggression / Violence:No  Patient / guardian was educated about steps to take if suicide or homicide risk level increases between visits:  yes While future psychiatric events cannot be accurately predicted, the patient does not currently require acute inpatient psychiatric care and does not currently meet [facility] involuntary commitment criteria.     Subjective: Patient presents for session on time.  She shared how she is currently moving as they have a new house they are going to rent.  She went on to share continued marital strain with her husband, how they disagree over finances.  She stated that they continue to have arguments.  We discussed the potential benefits of their going to marital counseling where she stated that she plans to discuss with her husband as she believes he might be willing.  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  Facilitated her identifying needs  where she plans to request more support from her husband and watching the children at times when she is having appointments related to work and in other areas as she wants to feel more supported.   Interventions: Supportive therapy, mot. interviewing   Diagnoses:    ICD-10-CM   1. Mixed obsessional thoughts and acts  F42.2          Plan: Patient to identify outlets for stress mgmt. Patient to verbalize need for more help w/ household tasks and w/ the children from her husband  Long-term goal:   Improve feelings of self-worth / image Decrease tendency to "shut down" when upset Fully process and move past the pain suffered during childhood   Short-term goal:  Decrease "deflating, self-doubting" and catastrophizing thinking style Improve effective communication and coping when upset or agitated Decrease tendency to attempt to self validate by comparing herself to others    Assessment of progress:  progressing   This record has been created using [employer].  Chart creation errors have been sought, but may not always have been located and corrected. Such creation errors do not reflect on the standard of medical care.    Anson [place], [facility]

## 2021-08-03 ENCOUNTER — Other Ambulatory Visit: Payer: Self-pay

## 2021-08-03 ENCOUNTER — Encounter: Payer: Self-pay | Admitting: Adult Health

## 2021-08-03 ENCOUNTER — Ambulatory Visit (INDEPENDENT_AMBULATORY_CARE_PROVIDER_SITE_OTHER): Payer: 59 | Admitting: Mental Health

## 2021-08-03 ENCOUNTER — Ambulatory Visit (INDEPENDENT_AMBULATORY_CARE_PROVIDER_SITE_OTHER): Payer: 59 | Admitting: Adult Health

## 2021-08-03 DIAGNOSIS — F422 Mixed obsessional thoughts and acts: Secondary | ICD-10-CM | POA: Diagnosis not present

## 2021-08-03 DIAGNOSIS — F411 Generalized anxiety disorder: Secondary | ICD-10-CM

## 2021-08-03 DIAGNOSIS — F909 Attention-deficit hyperactivity disorder, unspecified type: Secondary | ICD-10-CM

## 2021-08-03 DIAGNOSIS — F331 Major depressive disorder, recurrent, moderate: Secondary | ICD-10-CM | POA: Diagnosis not present

## 2021-08-03 MED ORDER — AMPHETAMINE-DEXTROAMPHETAMINE 10 MG PO TABS: 10.0000 mg | ORAL_TABLET | Freq: Every day | ORAL | 0 refills | Status: DC

## 2021-08-03 MED ORDER — AMPHETAMINE-DEXTROAMPHET ER 30 MG PO CP24: 30.0000 mg | ORAL_CAPSULE | Freq: Every day | ORAL | 0 refills | Status: DC

## 2021-08-03 NOTE — Progress Notes (Signed)
Cristina Davenport [id number] July 25, 1986 35 y.o.  Subjective:   Patient ID:  Cristina Davenport is a 35 y.o. (DOB Oct 23, 1986) female.  Chief Complaint: No chief complaint on file.   HPI ESTHA FEW presents to the office today for follow-up of ADHD, MDD, GAD, and obsessional thoughts and acts.   Describes mood today as "about the same, no changes". Pleasant. Mood symptoms - denies depression. Decreased anxiety and irritability - "not as stressed". Decreased worry and obsessing. Feels like the increase in Abilify has been helpful. Stating "I feel good". Mood has been more "level". Working with two therapists. Stable interest and motivation. Taking medications as prescribed.  Energy levels stable. Active, does not have a regular exercise routine.  Enjoys some usual interests and activities. Married. Lives with husband. Has 3 children - 12, 3, and 1. Family local.  Spending time with family. Appetite adequate. Weight stable - 128.4 to 130 - pounds - 61". Sleeps well most nights. Averages 5 to 6 hours.   Focus and concentration stable with Adderall - would like to increase the XR from 20mg  to 30mg  every morning. Completing tasks. Managing aspects of household. 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  Denies AH or VH.  Previous medication trials: Zoloft, Hydroxyzine, Adderall XR 20mg  daily, Lamictal   GAD-7    Flowsheet Row Office Visit from 10/28/2020 in [facility] from 06/19/2020 in [place] from 03/06/2020 in [facility] Visit from 08/17/2018 in [place] Visit from 05/21/2018 in [facility]  Total GAD-7 Score 19 17 21 11 20       PHQ2-9    [place] Office Visit from 01/19/2021 in [place] Visit from 10/28/2020 in [place] from 06/19/2020 in [place] from 03/06/2020 in [facility]  Initial Prenatal from 05/10/2019 in [facility]  PHQ-2 Total Score 6 1 1  0 0  PHQ-9 Total Score 18 7 8 15 4       Flowsheet Row Admission (Discharged) from 04/06/2021 in [facility] from 04/02/2021 in [facility] from 03/25/2021 in [place] No Risk No Risk Error: Question 6 not populated        Review of Systems:  Review of Systems  Musculoskeletal:  Negative for gait problem.  Neurological:  Negative for tremors.  Psychiatric/Behavioral:         Please refer to HPI   Medications: I have reviewed the patient's current medications.  Current Outpatient Medications  Medication Sig Dispense Refill   acetaminophen (TYLENOL) 500 MG tablet Take 500 mg by mouth 2 (two) times daily as needed for moderate pain.     amphetamine-dextroamphetamine (ADDERALL XR) 30 MG 24 hr capsule Take 1 capsule (30 mg total) by mouth daily. 30 capsule 0   amphetamine-dextroamphetamine (ADDERALL) 10 MG tablet Take 1 tablet (10 mg total) by mouth daily. 30 tablet 0   Apple Cider Vinegar 500 MG TABS Take 500 mg by mouth at bedtime.     ARIPiprazole (ABILIFY) 5 MG tablet Take one and 1/2 tablets daily. 45 tablet 2   AZO-CRANBERRY PO Take 1 tablet by mouth at bedtime.     Cholecalciferol (VITAMIN D3) 50 MCG (2000 UT) TABS Take 2,000 mg by mouth at bedtime.     clonazePAM (KLONOPIN) 0.5 MG tablet Take 1 tablet (0.5 mg total) by mouth  at bedtime. 30 tablet 2   Cyanocobalamin (B-12) 5000 MCG CAPS Take 5,000 mcg by mouth daily.     docusate sodium (COLACE) 100 MG capsule Take 100 mg by mouth at bedtime.     Ferrous Sulfate (IRON) 325 (65 Fe) MG TABS Take 325 mg by mouth at bedtime.     HYDROcodone-acetaminophen (NORCO) 5-325 MG tablet Take 1 tablet by mouth every 4 (four) hours as needed for moderate pain. 30 tablet 0   levonorgestrel (LILETTA, 52 MG,) 19.5 MCG/DAY IUD IUD 1 each by  Intrauterine route once.     Lysine 1000 MG TABS Take 1,000 mg by mouth at bedtime.     Magnesium 400 MG TABS Take 400 mg by mouth daily.     Melatonin 5 MG CAPS Take 5 mg by mouth at bedtime as needed (sleep).     methocarbamol (ROBAXIN) 500 MG tablet Take 1 tablet (500 mg total) by mouth every 6 (six) hours as needed for muscle tension/spasms in the daytime 60 tablet 1   NP THYROID 60 MG tablet Take 1 tablet (60 mg total) by mouth daily before breakfast. 30 tablet 3   progesterone (PROMETRIUM) 200 MG capsule Take 1 capsule (200 mg total) by mouth at bedtime. 90 capsule 1   sertraline (ZOLOFT) 100 MG tablet TAKE 2 TABLETS BY MOUTH EVERY DAY 180 tablet 0   No current facility-administered medications for this visit.    Medication Side Effects: None  Allergies:  Allergies  Allergen Reactions   Latex Other (See Comments)    General irritation   Doxycycline Rash    Solar rash    Past Medical History:  Diagnosis Date   Anxiety    Anxiety    Bipolar 1 disorder ([place])    Family history of adverse reaction to anesthesia    GERD (gastroesophageal reflux disease)    Hypertension    PIH   IBS (irritable bowel syndrome)    Migraine without aura     Past Medical History, Surgical history, Social history, and Family history were reviewed and updated as appropriate.   Please see review of systems for further details on the patient's review from today.   Objective:   Physical Exam:  There were no vitals taken for this visit.  Physical Exam Constitutional:      General: She is not in acute distress. Musculoskeletal:        General: No deformity.  Neurological:     Mental Status: She is alert and oriented to person, place, and time.     Coordination: Coordination normal.  Psychiatric:        Attention and Perception: Attention and perception normal. She does not perceive auditory or visual hallucinations.        Mood and Affect: Mood normal. Mood is not anxious or depressed.  Affect is not labile, blunt, angry or inappropriate.        Speech: Speech normal.        Behavior: Behavior normal.        Thought Content: Thought content normal. Thought content is not paranoid or delusional. Thought content does not include homicidal or suicidal ideation. Thought content does not include homicidal or suicidal plan.        Cognition and Memory: Cognition and memory normal.        Judgment: Judgment normal.     Comments: Insight intact    Lab Review:     Component Value Date/Time   NA 139 03/28/2021 1131  NA 137 05/10/2019 1138   K 3.7 03/28/2021 1131   CL 105 03/28/2021 1131   CO2 27 03/28/2021 1131   GLUCOSE 100 (H) 03/28/2021 1131   BUN 7 03/28/2021 1131   BUN 8 05/10/2019 1138   CREATININE 0.72 03/28/2021 1131   CALCIUM 9.3 03/28/2021 1131   PROT 7.0 03/28/2021 1131   PROT 6.9 05/10/2019 1138   ALBUMIN 4.3 03/28/2021 1131   ALBUMIN 4.6 05/10/2019 1138   AST 16 03/28/2021 1131   ALT 13 03/28/2021 1131   ALKPHOS 42 03/28/2021 1131   BILITOT 0.6 03/28/2021 1131   BILITOT 0.3 05/10/2019 1138   GFRNONAA >60 03/28/2021 1131   GFRAA >60 10/15/2019 0909       Component Value Date/Time   WBC 6.5 03/28/2021 1131   RBC 4.84 03/28/2021 1131   HGB 14.5 03/28/2021 1131   HGB 12.4 08/28/2019 0919   HGB 13.3 09/23/2015 1638   HCT 43.9 03/28/2021 1131   HCT 37.8 08/28/2019 0919   PLT 292 03/28/2021 1131   PLT 226 08/28/2019 0919   MCV 90.7 03/28/2021 1131   MCV 89 08/28/2019 0919   MCH 30.0 03/28/2021 1131   MCHC 33.0 03/28/2021 1131   RDW 12.3 03/28/2021 1131   RDW 12.4 08/28/2019 0919   LYMPHSABS 2.2 03/28/2021 1131   LYMPHSABS 2.0 05/10/2019 1445   MONOABS 0.4 03/28/2021 1131   EOSABS 0.2 03/28/2021 1131   EOSABS 0.1 05/10/2019 1445   BASOSABS 0.0 03/28/2021 1131   BASOSABS 0.0 05/10/2019 1445    No results found for: POCLITH, LITHIUM   No results found for: PHENYTOIN, PHENOBARB, VALPROATE, CBMZ   .res Assessment: Plan:    Plan:  PDMP  reviewed  1. Increase Adderall XR 20mg  to 30mg  daily 2. Zoloft 200mg  daily 3. Adderall 10mg  daily 4. Abilify 7.5mg  daily. 5. Clonazepam 0.5mg  daily  114/71/79  Plans see a therapist  Read and reviewed note with patient for accuracy.   RTC 3 months  Patient advised to contact office with any questions, adverse effects, or acute worsening in signs and symptoms.  Discussed potential benefits, risks, and side effects of stimulants with patient to include increased heart rate, palpitations, insomnia, increased anxiety, increased irritability, or decreased appetite.  Instructed patient to contact office if experiencing any significant tolerability issues.    Diagnoses and all orders for this visit:  Mixed obsessional thoughts and acts  Attention deficit hyperactivity disorder (ADHD), unspecified ADHD type -     amphetamine-dextroamphetamine (ADDERALL XR) 30 MG 24 hr capsule; Take 1 capsule (30 mg total) by mouth daily. -     amphetamine-dextroamphetamine (ADDERALL) 10 MG tablet; Take 1 tablet (10 mg total) by mouth daily.  Major depressive disorder, recurrent episode, moderate (HCC)  Generalized anxiety disorder    Please see After Visit Summary for patient specific instructions.  Future Appointments  Date Time Provider [place]  08/17/2021  9:00 AM An[place], [facility] CP-CP None  08/31/2021  9:00 AM [place], [facility] CP-CP None  09/14/2021  9:00 AM [place], [facility] CP-CP None  09/28/2021  9:00 AM Anson [place], [facility] CP-CP None    No orders of the defined types were placed in this encounter.   -------------------------------

## 2021-08-03 NOTE — Progress Notes (Signed)
[facility] Psychotherapy Note  Name: Cristina Davenport Date: 08/03/2021 MRN: [id number] DOB: 1986/06/11 PCP: Doree Albee, MD (Inactive)  Time spent: 45 minutes  Treatment: Individual therapy   Mental Status Exam:    Appearance:    Casual     Behavior:   Appropriate  Motor:   WNL  Speech/Language:    Clear and Coherent  Affect:   Full range   Mood:   Euthymic  Thought process:   normal  Thought content:     WNL  Sensory/Perceptual disturbances:     none  Orientation:   x4  Attention:   Good  Concentration:   Good  Memory:   Intact  Fund of knowledge:    Consistent with age and development  Insight:     Good  Judgment:    Good  Impulse Control:   Good    Reported Symptoms:  Sad and anxious feelings, irritability  Risk Assessment: Danger to Self:  No Self-injurious Behavior: No Danger to Others: No Duty to Warn:no Physical Aggression / Violence:No  Patient / guardian was educated about steps to take if suicide or homicide risk level increases between visits:  yes While future psychiatric events cannot be accurately predicted, the patient does not currently require acute inpatient psychiatric care and does not currently meet [facility] involuntary commitment criteria.     Subjective: Patient presents for session on time.  Patient shared progress, some recent changes with their metal relationship with her husband, how they have had less arguments recently. She went on to share how she feels that at times "how was the problem". It's been on to share how she is trying to not react emotionally in situations, to give herself time to think about how she's feeling. We discussed STOPP CBT skill to utilize between sessions to further this effort. Provide support and she continues to have needs in the relationship that she wants addressed, such as feeling supported with her career. She stated that she has spent some money recently and trying to decorate the house as  they recently moved into another rental home. She said their husband confronted her about the spending however, he did so in a supportive manner versus being overly critical. She feels like they still need work on their relationship, communication. 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 She plans to continue efforts in these areas.   Interventions: Supportive therapy, mot. interviewing   Diagnoses:    ICD-10-CM   1. Attention deficit hyperactivity disorder (ADHD), unspecified ADHD type  F90.9           Plan: Patient to identify outlets for stress mgmt. Patient to utilize coping skills, specifically STOPP.   Long-term goal:   Improve feelings of self-worth / image Decrease tendency to "shut down" when upset Fully process and move past the pain suffered during childhood   Short-term goal:  Decrease "deflating, self-doubting" and catastrophizing thinking style Improve effective communication and coping when upset or agitated Decrease tendency to attempt to self validate by comparing herself to  others    Assessment of progress:  progressing   This record has been created using [employer].  Chart creation errors have been sought, but may not always have been located and corrected. Such creation errors do not reflect on the standard of medical care.    [place], [facility]

## 2021-08-05 ENCOUNTER — Other Ambulatory Visit: Payer: Self-pay | Admitting: Adult Health

## 2021-08-05 DIAGNOSIS — F411 Generalized anxiety disorder: Secondary | ICD-10-CM

## 2021-08-05 DIAGNOSIS — F331 Major depressive disorder, recurrent, moderate: Secondary | ICD-10-CM

## 2021-08-05 DIAGNOSIS — F422 Mixed obsessional thoughts and acts: Secondary | ICD-10-CM

## 2021-08-17 ENCOUNTER — Other Ambulatory Visit: Payer: Self-pay

## 2021-08-17 ENCOUNTER — Ambulatory Visit (INDEPENDENT_AMBULATORY_CARE_PROVIDER_SITE_OTHER): Payer: 59 | Admitting: Mental Health

## 2021-08-17 DIAGNOSIS — F422 Mixed obsessional thoughts and acts: Secondary | ICD-10-CM

## 2021-08-17 NOTE — Progress Notes (Signed)
[facility] Psychotherapy Note  Name: Cristina Davenport Date: 08/17/21 MRN: [id number] DOB: [birth year] PCP: Doree Albee, MD (Inactive)  Time spent: 46 minutes  Treatment: Individual therapy   Mental Status Exam:    Appearance:    Casual     Behavior:   Appropriate  Motor:   WNL  Speech/Language:    Clear and Coherent  Affect:   Full range   Mood:   Euthymic  Thought process:   normal  Thought content:     WNL  Sensory/Perceptual disturbances:     none  Orientation:   x4  Attention:   Good  Concentration:   Good  Memory:   Intact  Fund of knowledge:    Consistent with age and development  Insight:     Good  Judgment:    Good  Impulse Control:   Good    Reported Symptoms:  Sad and anxious feelings, irritability  Risk Assessment: Danger to Self:  No Self-injurious Behavior: No Danger to Others: No Duty to Warn:no Physical Aggression / Violence:No  Patient / guardian was educated about steps to take if suicide or homicide risk level increases between visits:  yes While future psychiatric events cannot be accurately predicted, the patient does not currently require acute inpatient psychiatric care and does not currently meet [facility] involuntary commitment criteria.     Subjective: Patient presents for session on time.  She shared recent progress, events.  She stated that she and her husband continue to work on their communication and their relationship, she reports some improvements.  She stated that they continue to have some financial disagreements regarding money management.  She stated that she has worked to be mindful of her reactions, how she wants to sound in her communication toward it being more effective and less reactive.  She went on to share details, experiences as well as plans.  We facilitated her identifying how making   Interventions: Supportive therapy, mot. interviewing   Diagnoses:    ICD-10-CM   1. Mixed obsessional thoughts and  acts  F42.2            Plan: Patient to identify outlets for stress mgmt. Patient to utilize coping skills, specifically STOPP.   Long-term goal:   Improve feelings of self-worth / image Decrease tendency to "shut down" when upset Fully process and move past the pain suffered during childhood   Short-term goal:  Decrease "deflating, self-doubting" and catastrophizing thinking style Improve effective communication and coping when upset or agitated Decrease tendency to attempt to self validate by comparing herself to others    Assessment of progress:  progressing   This record has been created using [employer].  Chart creation errors have been sought, but may not always have been located and corrected. Such creation errors do not reflect on the standard of medical care.    Anson [place], [facility]

## 2021-08-31 ENCOUNTER — Ambulatory Visit (INDEPENDENT_AMBULATORY_CARE_PROVIDER_SITE_OTHER): Payer: 59 | Admitting: Mental Health

## 2021-08-31 ENCOUNTER — Other Ambulatory Visit: Payer: Self-pay

## 2021-08-31 DIAGNOSIS — F422 Mixed obsessional thoughts and acts: Secondary | ICD-10-CM

## 2021-08-31 NOTE — Progress Notes (Signed)
[facility] Psychotherapy Note  Name: Cristina Davenport Date: 08/31/21 MRN: [id number] DOB: 1986-10-04 PCP: Cristina Albee, MD (Inactive)  Time spent: 46 minutes  Treatment: Individual therapy   Mental Status Exam:    Appearance:    Casual     Behavior:   Appropriate  Motor:   WNL  Speech/Language:    Clear and Coherent  Affect:   Full range   Mood:   Euthymic  Thought process:   normal  Thought content:     WNL  Sensory/Perceptual disturbances:     none  Orientation:   x4  Attention:   Good  Concentration:   Good  Memory:   Intact  Fund of knowledge:    Consistent with age and development  Insight:     Good  Judgment:    Good  Impulse Control:   Good    Reported Symptoms:  Sad and anxious feelings, irritability  Risk Assessment: Danger to Self:  No Self-injurious Behavior: No Danger to Others: No Duty to Warn:no Physical Aggression / Violence:No  Patient / guardian was educated about steps to take if suicide or homicide risk level increases between visits:  yes While future psychiatric events cannot be accurately predicted, the patient does not currently require acute inpatient psychiatric care and does not currently meet [facility] involuntary commitment criteria.   Subjective: Patient presents for session on time.  Patient shared recent events, where she stated that she misses seeing her daughter as often as she would like.  She stated that she did not come over to her home to spend the weekend as planned.  Patient shared how she is trying to consider other ways to view the situation, trying to recognize that her daughter, now close to the age of 45, may want more time with her friends which may end up interfering with some of the weekends.  She went on to share a recent interaction through text with her ex-husband.  She stated that she had to have a discussion with him due to some recent events.  She shared more history related to his being unfaithful when  they were together, subsequent feelings at that time that ultimately led to their break-up.  She stated that he is a good father and values their efforts to coparent effectively with 1 another, continue to work on their communication.  Patient processed feelings related, identifying the value she has not relationship with her current husband.  She stated that they have made more time with 1 another, going out for dates at least once every 2 weeks.  Facilitation identifying ways she has made time to stop and take notice of thoughts she may have about situations, relationships and give her herself time to reflect and give perspective as opposed to being reactive.  Interventions: Supportive therapy, mot. interviewing   Diagnoses:    ICD-10-CM   1. Mixed obsessional thoughts and acts  F42.2         Plan: Patient to identify outlets for stress mgmt. Patient to utilize coping skills, specifically STOPP.   Long-term goal:   Improve feelings of self-worth / image Decrease tendency to "shut down" when upset Fully process and move past the pain suffered during childhood   Short-term goal:  Decrease "deflating, self-doubting" and catastrophizing thinking style Improve effective communication and coping when upset or agitated Decrease tendency to attempt to self validate by comparing herself to others    Assessment of progress:  progressing   This record has  been created using [employer].  Chart creation errors have been sought, but may not always have been located and corrected. Such creation errors do not reflect on the standard of medical care.    [place], [facility]

## 2021-09-05 ENCOUNTER — Other Ambulatory Visit: Payer: Self-pay | Admitting: Adult Health

## 2021-09-05 DIAGNOSIS — F422 Mixed obsessional thoughts and acts: Secondary | ICD-10-CM

## 2021-09-05 DIAGNOSIS — F411 Generalized anxiety disorder: Secondary | ICD-10-CM

## 2021-09-05 DIAGNOSIS — F331 Major depressive disorder, recurrent, moderate: Secondary | ICD-10-CM

## 2021-09-06 ENCOUNTER — Telehealth: Payer: Self-pay | Admitting: Adult Health

## 2021-09-06 ENCOUNTER — Other Ambulatory Visit: Payer: Self-pay

## 2021-09-06 DIAGNOSIS — F909 Attention-deficit hyperactivity disorder, unspecified type: Secondary | ICD-10-CM

## 2021-09-06 MED ORDER — AMPHETAMINE-DEXTROAMPHET ER 30 MG PO CP24: 30.0000 mg | ORAL_CAPSULE | Freq: Every day | ORAL | 0 refills | Status: DC

## 2021-09-06 MED ORDER — AMPHETAMINE-DEXTROAMPHETAMINE 10 MG PO TABS: 10.0000 mg | ORAL_TABLET | Freq: Every day | ORAL | 0 refills | Status: DC

## 2021-09-06 NOTE — Telephone Encounter (Signed)
Pended.

## 2021-09-06 NOTE — Telephone Encounter (Signed)
Cristina Davenport called stating that the Adderall 10 and 30 mg is working well for her. She is requesting a refill on both of these to be called to:  [facility] [place]  Phone:  [phone]  Fax:  [phone]

## 2021-09-14 ENCOUNTER — Ambulatory Visit (INDEPENDENT_AMBULATORY_CARE_PROVIDER_SITE_OTHER): Payer: 59 | Admitting: Mental Health

## 2021-09-14 ENCOUNTER — Other Ambulatory Visit: Payer: Self-pay

## 2021-09-14 DIAGNOSIS — F422 Mixed obsessional thoughts and acts: Secondary | ICD-10-CM | POA: Diagnosis not present

## 2021-09-14 NOTE — Progress Notes (Signed)
[facility] Psychotherapy Note  Name: Cristina Davenport Date: 09/14/21 MRN: [id number] DOB: [birth year] PCP: Doree Albee, MD (Inactive)  Time spent: 46 minutes  Treatment: Individual therapy   Mental Status Exam:    Appearance:    Casual     Behavior:   Appropriate  Motor:   WNL  Speech/Language:    Clear and Coherent  Affect:   Full range   Mood:   Euthymic  Thought process:   normal  Thought content:     WNL  Sensory/Perceptual disturbances:     none  Orientation:   x4  Attention:   Good  Concentration:   Good  Memory:   Intact  Fund of knowledge:    Consistent with age and development  Insight:     Good  Judgment:    Good  Impulse Control:   Good    Reported Symptoms:  Sad and anxious feelings, irritability  Risk Assessment: Danger to Self:  No Self-injurious Behavior: No Danger to Others: No Duty to Warn:no Physical Aggression / Violence:No  Patient / guardian was educated about steps to take if suicide or homicide risk level increases between visits:  yes While future psychiatric events cannot be accurately predicted, the patient does not currently require acute inpatient psychiatric care and does not currently meet [facility] involuntary commitment criteria.   Subjective: Patient presents for session on time.  Patient shared recent progress, how she had a recent argument with one of her friends.  She said the friend is a man she has known for about 10 years.  She stated his father passed away about a year ago, how she was unaware of the anniversary and this was a catalyst to their having an argument.  She stated that he confronted her on not listening to his life issues as he does her.  She stated this friendship is strictly a friendship, how her husband is aware.  She stated she gained some insight, her tendency to be distracted, sometimes talk excessively which she understands how this could leave him feeling marginalized in some way.  She shared how  she is trying to be cognizant of being a better listener.  She went on to share ways she has coped with her ADHD diagnosis, stays organized, going on to share her tendency to organize many aspects of her day.  More examples of her tendency to obsess about details such as rewriting lists if they did not look correctly initially when written, having multiple projects, starting new tasks before the prior task was completed etc.  Through motivational interviewing, she identified the need to continue to have a high level of organization due to her ADHD and also the significance of being able to keep up with work and home demands.  Interventions: Supportive therapy, mot. interviewing   Diagnoses:    ICD-10-CM   1. Mixed obsessional thoughts and acts  F42.2          Plan: Patient to identify outlets for stress mgmt. Patient to utilize coping skills, specifically STOPP.   Long-term goal:   Improve feelings of self-worth / image Decrease tendency to "shut down" when upset Fully process and move past the pain suffered during childhood   Short-term goal:  Decrease "deflating, self-doubting" and catastrophizing thinking style Improve effective communication and coping when upset or agitated Decrease tendency to attempt to self validate by comparing herself to others    Assessment of progress:  progressing   This record has been created  using [employer].  Chart creation errors have been sought, but may not always have been located and corrected. Such creation errors do not reflect on the standard of medical care.    [place], [facility]

## 2021-09-27 ENCOUNTER — Telehealth: Payer: Self-pay | Admitting: Adult Health

## 2021-09-27 NOTE — Telephone Encounter (Signed)
Pt LVM that she wants to advise Barnett Applebaum she wants to be taken off the Abilify.  She said the side effect are too much for her.  She said facial acne and she mentioned something else I couldn't understand as a side effect she was having.

## 2021-09-27 NOTE — Telephone Encounter (Signed)
Called patient to clarify reasoning for stopping Abilify. She states acne, exhaustion, and constipation. She stopped taking it on 09/24/21. Please advise options.

## 2021-09-27 NOTE — Telephone Encounter (Signed)
LVM to [facility] for more information.

## 2021-09-27 NOTE — Telephone Encounter (Signed)
Let's give her some time to get it out of her system and see if symptoms resolve. We can also switch to [place] 20mg  daily.

## 2021-09-28 ENCOUNTER — Ambulatory Visit (INDEPENDENT_AMBULATORY_CARE_PROVIDER_SITE_OTHER): Payer: 59 | Admitting: Mental Health

## 2021-09-28 ENCOUNTER — Other Ambulatory Visit: Payer: Self-pay

## 2021-09-28 DIAGNOSIS — F909 Attention-deficit hyperactivity disorder, unspecified type: Secondary | ICD-10-CM

## 2021-09-28 NOTE — Telephone Encounter (Signed)
LVM for patient with info.

## 2021-09-28 NOTE — Progress Notes (Signed)
[facility] Psychotherapy Note  Name: Cristina Davenport Date: 09/28/21 MRN: [id number] DOB: 09/09/1986 PCP: Cristina Albee, MD (Inactive)  Time spent: 51 minutes  Treatment: Individual therapy   Mental Status Exam:    Appearance:    Casual     Behavior:   Appropriate  Motor:   WNL  Speech/Language:    Clear and Coherent  Affect:   Full range   Mood:   Euthymic  Thought process:   normal  Thought content:     WNL  Sensory/Perceptual disturbances:     none  Orientation:   x4  Attention:   Good  Concentration:   Good  Memory:   Intact  Fund of knowledge:    Consistent with age and development  Insight:     Good  Judgment:    Good  Impulse Control:   Good    Reported Symptoms:  Sad and anxious feelings, irritability  Risk Assessment: Danger to Self:  No Self-injurious Behavior: No Danger to Others: No Duty to Warn:no Physical Aggression / Violence:No  Patient / guardian was educated about steps to take if suicide or homicide risk level increases between visits:  yes While future psychiatric events cannot be accurately predicted, the patient does not currently require acute inpatient psychiatric care and does not currently meet [facility] involuntary commitment criteria.   Subjective: Patient presents for session on time.  She shared recent progress, how she feels that she may be having some side effects to her current medication.  She scheduled an appointment in 2 days to for follow-up med check.  She stated that she has had some increased weight gain, about 10 pounds over the past 2 months.  She stated that her appetite has increased.  She stated she discontinued her medication last Friday however, states that she felt more depressed, less motivation and fatigue as a result.  She stated she restarted the medication yesterday and plans to continue to take it until her appointment.  She went on to share challenges in her marital relationship.  Facilitated her  identifying needs where she shared that she does not feel valued often by her husband, many of their arguments centered around finances.  She stated that she pays half the bills and that he constantly reminds her of how he "put me up" in the past.  She stated a few years ago prior to their having the last child, he paid the household bills solely.  She stated that during this time she contributed financially as she worked 2-3 jobs, giving what she could to household needs. Through motivational interviewing, she identified the need to assert herself when needed with her communication in the relationship.  She seeks more validation for her efforts on a day-to-day basis from him as well as needing more support with the household and taking care of their children.  We reviewed the possibility of their engaging in couples counseling where she plans to revisit with her husband in a discussion.   Interventions: Supportive therapy, mot. interviewing   Diagnoses:    ICD-10-CM   1. Attention deficit hyperactivity disorder (ADHD), unspecified ADHD type  F90.9           Plan: Patient to identify outlets for stress mgmt, utilize her support system, work to be mindful of thoughts that increase her anxiety and stress.  Long-term goal:   Improve feelings of self-worth / image Decrease tendency to "shut down" when upset Fully process and move past the pain suffered  during childhood   Short-term goal:  Decrease "deflating, self-doubting" and catastrophizing thinking style Improve effective communication and coping when upset or agitated Decrease tendency to attempt to self validate by comparing herself to others    Assessment of progress:  progressing   This record has been created using [employer].  Chart creation errors have been sought, but may not always have been located and corrected. Such creation errors do not reflect on the standard of medical care.    Anson [place], [facility]

## 2021-09-28 NOTE — Telephone Encounter (Signed)
Agreed -

## 2021-09-30 ENCOUNTER — Other Ambulatory Visit: Payer: Self-pay

## 2021-09-30 ENCOUNTER — Encounter: Payer: Self-pay | Admitting: Adult Health

## 2021-09-30 ENCOUNTER — Ambulatory Visit (INDEPENDENT_AMBULATORY_CARE_PROVIDER_SITE_OTHER): Payer: 59 | Admitting: Adult Health

## 2021-09-30 DIAGNOSIS — F331 Major depressive disorder, recurrent, moderate: Secondary | ICD-10-CM

## 2021-09-30 DIAGNOSIS — F422 Mixed obsessional thoughts and acts: Secondary | ICD-10-CM

## 2021-09-30 DIAGNOSIS — F909 Attention-deficit hyperactivity disorder, unspecified type: Secondary | ICD-10-CM

## 2021-09-30 DIAGNOSIS — F411 Generalized anxiety disorder: Secondary | ICD-10-CM

## 2021-09-30 MED ORDER — LATUDA 20 MG PO TABS: 20.0000 mg | ORAL_TABLET | Freq: Every day | ORAL | 0 refills | Status: DC

## 2021-09-30 MED ORDER — SERTRALINE HCL 100 MG PO TABS: ORAL_TABLET | ORAL | 0 refills | Status: DC

## 2021-09-30 MED ORDER — AMPHETAMINE-DEXTROAMPHET ER 30 MG PO CP24: 30.0000 mg | ORAL_CAPSULE | Freq: Every day | ORAL | 0 refills | Status: DC

## 2021-09-30 MED ORDER — AMPHETAMINE-DEXTROAMPHETAMINE 10 MG PO TABS: 10.0000 mg | ORAL_TABLET | Freq: Every day | ORAL | 0 refills | Status: DC

## 2021-09-30 MED ORDER — CLONAZEPAM 0.5 MG PO TABS: 0.5000 mg | ORAL_TABLET | Freq: Every day | ORAL | 2 refills | Status: DC

## 2021-09-30 NOTE — Progress Notes (Signed)
Cristina Davenport [id number] 01/09/1986 35 y.o.  Subjective:   Patient ID:  Cristina Davenport is a 35 y.o. (DOB Apr 04, 1986) female.  Chief Complaint: No chief complaint on file.   HPI Cristina Davenport presents to the office today for follow-up of ADHD, MDD, GAD, and obsessional thoughts and acts.   Describes mood today as "not too good". Pleasant. Mood symptoms - reports depression. Denies anxiety and irritability. Increased worry and obsessing. Stopped the Abilify a week ago due to weight gain and other side effects. Reports weight gain - 128 to 145 pounds. Mood has been more "low" since stopping the Abilify. Willing to try the Latuda to help with mood stabilization. Working with two therapists. Stable interest and motivation. Taking medications as prescribed.  Energy level lower - better in the mornings. Active, does not have a regular exercise routine.  Enjoys some usual interests and activities. Married. Lives with husband. Has 3 children - 12, 3, and 1. Family local.  Spending time with family. Appetite adequate. Weight stable - 128.4 to 130 - pounds - 61". Sleeps well most nights. Averages 5 to 6 hours.   Focus and concentration stable with Adderall - would like to increase the XR from 20mg  to 30mg  every morning. Completing tasks. Managing aspects of household. 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  Denies AH or VH.  Previous medication trials: Zoloft, Hydroxyzine, Adderall XR 20mg  daily, Lamictal   GAD-7    Flowsheet Row Office Visit from 10/28/2020 in [place] from 06/19/2020 in [place] from 03/06/2020 in [place] Visit from 08/17/2018 in [place] Visit from 05/21/2018 in [place]  Total GAD-7 Score 19 17 21 11 20       PHQ2-9    [place] Office Visit from 01/19/2021 in [place] Visit from 10/28/2020 in [place] from 06/19/2020 in [place] from 03/06/2020 in [place] Initial Prenatal from 05/10/2019 in [facility]  PHQ-2 Total Score 6 1 1  0 0  PHQ-9 Total Score 18 7 8 15 4       Flowsheet Row Admission (Discharged) from 04/06/2021 in [place] [facility] from 04/02/2021 in [place] ED from 03/25/2021 in [place] No Risk No Risk Error: Question 6 not populated        Review of Systems:  Review of Systems  Medications: I have reviewed the patient's current medications.  Current Outpatient Medications  Medication Sig Dispense Refill   acetaminophen (TYLENOL) 500 MG tablet Take 500 mg by mouth 2 (two) times daily as needed for moderate pain.     amphetamine-dextroamphetamine (ADDERALL XR) 30 MG 24 hr capsule Take 1 capsule (30 mg total) by mouth daily. 30 capsule 0   amphetamine-dextroamphetamine (ADDERALL) 10 MG tablet Take 1 tablet (10 mg total) by mouth daily. 30 tablet 0   Apple Cider Vinegar 500 MG TABS Take 500 mg by mouth at bedtime.     ARIPiprazole (ABILIFY) 5 MG tablet TAKE ONE AND 1/2 TABLETS DAILY. 135 tablet 0   AZO-CRANBERRY PO Take 1 tablet by mouth at bedtime.     Cholecalciferol (VITAMIN D3) 50 MCG (2000 UT) TABS Take 2,000 mg by mouth at bedtime.     clonazePAM (KLONOPIN) 0.5 MG tablet Take 1 tablet (0.5 mg total) by mouth at bedtime. 30 tablet 2  Cyanocobalamin (B-12) 5000 MCG CAPS Take 5,000 mcg by mouth daily.     docusate sodium (COLACE) 100 MG capsule Take 100 mg by mouth at bedtime.     Ferrous Sulfate (IRON) 325 (65 Fe) MG TABS Take 325 mg by mouth at bedtime.     HYDROcodone-acetaminophen (NORCO) 5-325 MG tablet Take 1 tablet by mouth every 4 (four) hours as needed for moderate pain. 30 tablet 0   levonorgestrel (LILETTA, 52 MG,) 19.5 MCG/DAY IUD IUD 1 each by Intrauterine route once.      Lysine 1000 MG TABS Take 1,000 mg by mouth at bedtime.     Magnesium 400 MG TABS Take 400 mg by mouth daily.     Melatonin 5 MG CAPS Take 5 mg by mouth at bedtime as needed (sleep).     methocarbamol (ROBAXIN) 500 MG tablet Take 1 tablet (500 mg total) by mouth every 6 (six) hours as needed for muscle tension/spasms in the daytime 60 tablet 1   NP THYROID 60 MG tablet Take 1 tablet (60 mg total) by mouth daily before breakfast. 30 tablet 3   progesterone (PROMETRIUM) 200 MG capsule Take 1 capsule (200 mg total) by mouth at bedtime. 90 capsule 1   sertraline (ZOLOFT) 100 MG tablet TAKE 2 TABLETS BY MOUTH EVERY DAY 180 tablet 0   No current facility-administered medications for this visit.    Medication Side Effects:  Allergies  Allergen Reactions   Latex Other (See Comments)    General irritation   Doxycycline Rash    Solar rash  None  Allergies:    Past Medical History:  Diagnosis Date   Anxiety    Anxiety    Bipolar 1 disorder ([place])    Family history of adverse reaction to anesthesia    GERD (gastroesophageal reflux disease)    Hypertension    PIH   IBS (irritable bowel syndrome)    Migraine without aura     Past Medical History, Surgical history, Social history, and Family history were reviewed and updated as appropriate.   Please see review of systems for further details on the patient's review from today.   Objective:   Physical Exam:  There were no vitals taken for this visit.  Physical Exam  Lab Review:     Component Value Date/Time   NA 139 03/28/2021 1131   NA 137 05/10/2019 1138   K 3.7 03/28/2021 1131   CL 105 03/28/2021 1131   CO2 27 03/28/2021 1131   GLUCOSE 100 (H) 03/28/2021 1131   BUN 7 03/28/2021 1131   BUN 8 05/10/2019 1138   CREATININE 0.72 03/28/2021 1131   CALCIUM 9.3 03/28/2021 1131   PROT 7.0 03/28/2021 1131   PROT 6.9 05/10/2019 1138   ALBUMIN 4.3 03/28/2021 1131   ALBUMIN 4.6 05/10/2019 1138   AST 16 03/28/2021 1131   ALT 13  03/28/2021 1131   ALKPHOS 42 03/28/2021 1131   BILITOT 0.6 03/28/2021 1131   BILITOT 0.3 05/10/2019 1138   GFRNONAA >60 03/28/2021 1131   GFRAA >60 10/15/2019 0909       Component Value Date/Time   WBC 6.5 03/28/2021 1131   RBC 4.84 03/28/2021 1131   HGB 14.5 03/28/2021 1131   HGB 12.4 08/28/2019 0919   HGB 13.3 09/23/2015 1638   HCT 43.9 03/28/2021 1131   HCT 37.8 08/28/2019 0919   PLT 292 03/28/2021 1131   PLT 226 08/28/2019 0919   MCV 90.7 03/28/2021 1131   MCV 89 08/28/2019 0919  MCH 30.0 03/28/2021 1131   MCHC 33.0 03/28/2021 1131   RDW 12.3 03/28/2021 1131   RDW 12.4 08/28/2019 0919   LYMPHSABS 2.2 03/28/2021 1131   LYMPHSABS 2.0 05/10/2019 1445   MONOABS 0.4 03/28/2021 1131   EOSABS 0.2 03/28/2021 1131   EOSABS 0.1 05/10/2019 1445   BASOSABS 0.0 03/28/2021 1131   BASOSABS 0.0 05/10/2019 1445    No results found for: POCLITH, LITHIUM   No results found for: PHENYTOIN, PHENOBARB, VALPROATE, CBMZ   .res Assessment: Plan:     Plan:  PDMP reviewed  1. Adderall XR 30mg  daily 2. Zoloft 200mg  daily 3. Adderall 10mg  daily 4. D/C Abilify 7.5mg  daily - side effects - weight gain. 5. Add Latuda 20mg  daily 6. Clonazepam 0.5mg  daily  114/71/79  Seeing therapist  Read and reviewed note with patient for accuracy.   RTC 3 months  Patient advised to contact office with any questions, adverse effects, or acute worsening in signs and symptoms.  Discussed potential benefits, risks, and side effects of stimulants with patient to include increased heart rate, palpitations, insomnia, increased anxiety, increased irritability, or decreased appetite.  Instructed patient to contact office if experiencing any significant tolerability issues.  There are no diagnoses linked to this encounter.   Please see After Visit Summary for patient specific instructions.  Future Appointments  Date Time Provider [place]  10/21/2021  9:00 AM Anson [place], [facility]  CP-CP None  11/04/2021  9:00 AM Anson [place], [facility] CP-CP None  11/16/2021  9:00 AM Anson [place], [facility] CP-CP None  11/30/2021  9:00 AM Anson [place], [facility] CP-CP None  12/14/2021  9:00 AM Anson [place], [facility] CP-CP None    No orders of the defined types were placed in this encounter.   -------------------------------

## 2021-10-05 ENCOUNTER — Telehealth: Payer: Self-pay | Admitting: Adult Health

## 2021-10-05 NOTE — Telephone Encounter (Signed)
Error.  Pt left message for refill today, but looks like the refillad been sent in on 11/17

## 2021-10-21 ENCOUNTER — Ambulatory Visit: Payer: 59 | Admitting: Adult Health

## 2021-10-21 ENCOUNTER — Ambulatory Visit: Payer: 59 | Admitting: Mental Health

## 2021-10-26 ENCOUNTER — Ambulatory Visit: Payer: 59 | Admitting: Adult Health

## 2021-11-04 ENCOUNTER — Ambulatory Visit: Payer: 59 | Admitting: Mental Health

## 2021-11-05 ENCOUNTER — Other Ambulatory Visit: Payer: Self-pay | Admitting: Adult Health

## 2021-11-05 DIAGNOSIS — F331 Major depressive disorder, recurrent, moderate: Secondary | ICD-10-CM

## 2021-11-10 ENCOUNTER — Telehealth: Payer: Self-pay | Admitting: Adult Health

## 2021-11-10 ENCOUNTER — Encounter: Payer: Self-pay | Admitting: Adult Health

## 2021-11-10 ENCOUNTER — Other Ambulatory Visit: Payer: Self-pay

## 2021-11-10 DIAGNOSIS — F909 Attention-deficit hyperactivity disorder, unspecified type: Secondary | ICD-10-CM

## 2021-11-10 MED ORDER — AMPHETAMINE-DEXTROAMPHET ER 30 MG PO CP24: 30.0000 mg | ORAL_CAPSULE | Freq: Every day | ORAL | 0 refills | Status: DC

## 2021-11-10 MED ORDER — AMPHETAMINE-DEXTROAMPHETAMINE 10 MG PO TABS: 10.0000 mg | ORAL_TABLET | Freq: Every day | ORAL | 0 refills | Status: DC

## 2021-11-10 NOTE — Telephone Encounter (Signed)
Pt should have received 90 day on 11/17, has apt 12/30

## 2021-11-10 NOTE — Telephone Encounter (Signed)
Pt requesting Rx for Adderall XR 30 mg and Adderall 10 mg @ [facility] in [place] . Apt 12/30.

## 2021-11-10 NOTE — Telephone Encounter (Signed)
Pended.

## 2021-11-12 ENCOUNTER — Other Ambulatory Visit: Payer: Self-pay

## 2021-11-12 ENCOUNTER — Encounter: Payer: Self-pay | Admitting: Adult Health

## 2021-11-12 ENCOUNTER — Ambulatory Visit (INDEPENDENT_AMBULATORY_CARE_PROVIDER_SITE_OTHER): Payer: 59 | Admitting: Adult Health

## 2021-11-12 DIAGNOSIS — F411 Generalized anxiety disorder: Secondary | ICD-10-CM

## 2021-11-12 DIAGNOSIS — F909 Attention-deficit hyperactivity disorder, unspecified type: Secondary | ICD-10-CM

## 2021-11-12 DIAGNOSIS — F331 Major depressive disorder, recurrent, moderate: Secondary | ICD-10-CM

## 2021-11-12 DIAGNOSIS — F422 Mixed obsessional thoughts and acts: Secondary | ICD-10-CM | POA: Diagnosis not present

## 2021-11-12 MED ORDER — AMPHETAMINE-DEXTROAMPHETAMINE 10 MG PO TABS: 10.0000 mg | ORAL_TABLET | Freq: Every day | ORAL | 0 refills | Status: DC

## 2021-11-12 MED ORDER — AMPHETAMINE-DEXTROAMPHET ER 30 MG PO CP24
30.0000 mg | ORAL_CAPSULE | Freq: Every day | ORAL | 0 refills | Status: DC
Start: 2021-11-12 — End: 2021-12-14

## 2021-11-12 NOTE — Progress Notes (Signed)
Cristina Davenport [id number] Jun 26, 1986 35 y.o.  Subjective:   Patient ID:  Cristina Davenport is a 35 y.o. (DOB 1986/02/02) female.  Chief Complaint: No chief complaint on file.   HPI Cristina Davenport presents to the office today for follow-up of ADHD, MDD, GAD, and obsessional thoughts and acts.   Describes mood today as "not too good". Pleasant. Mood symptoms - reports depression. Stating "I have felt better the past two days". Feels anxious and irritable at times. Decreased worry and obsessing. Has not taken Latuda for the past 2 days and is feeling better. Would like to try and leave it off for the next month and see how she does. Planning to keep a journal on daily moods. Working with two therapists. Improved interest and motivation. Taking medications as prescribed.  Energy levels improved. Active, does not have a regular exercise routine. Enjoys some usual interests and activities. Married. Lives with husband. Has 3 children - 12, 3, and 1. Family local.  Spending time with family. Appetite adequate. Weight 137 pounds. Sleeps well most nights. Averages 7 to 9 hours.   Focus and concentration improved over the past few days. Completing tasks. Managing aspects of household. 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  Denies AH or VH.  Previous medication trials: Zoloft, Hydroxyzine, Adderall XR 20mg  daily, Lamictal   GAD-7    Flowsheet Row Office Visit from 10/28/2020 in [place] from 06/19/2020 in [place] from 03/06/2020 in [place] Visit from 08/17/2018 in [place] Visit from 05/21/2018 in [place]  Total GAD-7 Score 19 17 21 11 20       PHQ2-9    [place] Office Visit from 01/19/2021 in [place] Visit from 10/28/2020 in [place] from 06/19/2020 in [place] from 03/06/2020 in  [place] Initial Prenatal from 05/10/2019 in [facility]  PHQ-2 Total Score 6 1 1  0 0  PHQ-9 Total Score 18 7 8 15 4       Flowsheet Row Admission (Discharged) from 04/06/2021 in [place] 45 from 04/02/2021 in [place] ED from 03/25/2021 in [place] No Risk No Risk Error: Question 6 not populated        Review of Systems:  Review of Systems  Musculoskeletal:  Negative for gait problem.  Neurological:  Negative for tremors.  Psychiatric/Behavioral:         Please refer to HPI   Medications: I have reviewed the patient's current medications.  Current Outpatient Medications  Medication Sig Dispense Refill   acetaminophen (TYLENOL) 500 MG tablet Take 500 mg by mouth 2 (two) times daily as needed for moderate pain.     amphetamine-dextroamphetamine (ADDERALL XR) 30 MG 24 hr capsule Take 1 capsule (30 mg total) by mouth daily. 30 capsule 0   amphetamine-dextroamphetamine (ADDERALL) 10 MG tablet Take 1 tablet (10 mg total) by mouth daily. 30 tablet 0   Apple Cider Vinegar 500 MG TABS Take 500 mg by mouth at bedtime.     AZO-CRANBERRY PO Take 1 tablet by mouth at bedtime.     Cholecalciferol (VITAMIN D3) 50 MCG (2000 UT) TABS Take 2,000 mg by mouth at bedtime.     clonazePAM (KLONOPIN) 0.5 MG tablet Take 1 tablet (0.5 mg total) by mouth at bedtime. 30 tablet 2   Cyanocobalamin (B-12)  5000 MCG CAPS Take 5,000 mcg by mouth daily.     docusate sodium (COLACE) 100 MG capsule Take 100 mg by mouth at bedtime.     Ferrous Sulfate (IRON) 325 (65 Fe) MG TABS Take 325 mg by mouth at bedtime.     HYDROcodone-acetaminophen (NORCO) 5-325 MG tablet Take 1 tablet by mouth every 4 (four) hours as needed for moderate pain. 30 tablet 0   LATUDA 20 MG TABS tablet TAKE 1 TABLET BY MOUTH EVERY DAY 90 tablet 0   levonorgestrel (LILETTA, 52 MG,) 19.5 MCG/DAY  IUD IUD 1 each by Intrauterine route once.     Lysine 1000 MG TABS Take 1,000 mg by mouth at bedtime.     Magnesium 400 MG TABS Take 400 mg by mouth daily.     Melatonin 5 MG CAPS Take 5 mg by mouth at bedtime as needed (sleep).     methocarbamol (ROBAXIN) 500 MG tablet Take 1 tablet (500 mg total) by mouth every 6 (six) hours as needed for muscle tension/spasms in the daytime 60 tablet 1   NP THYROID 60 MG tablet Take 1 tablet (60 mg total) by mouth daily before breakfast. 30 tablet 3   progesterone (PROMETRIUM) 200 MG capsule Take 1 capsule (200 mg total) by mouth at bedtime. 90 capsule 1   sertraline (ZOLOFT) 100 MG tablet TAKE 2 TABLETS BY MOUTH EVERY DAY 180 tablet 0   No current facility-administered medications for this visit.    Medication Side Effects: None  Allergies:  Allergies  Allergen Reactions   Latex Other (See Comments)    General irritation   Doxycycline Rash    Solar rash    Past Medical History:  Diagnosis Date   Anxiety    Anxiety    Bipolar 1 disorder ([place])    Family history of adverse reaction to anesthesia    GERD (gastroesophageal reflux disease)    Hypertension    PIH   IBS (irritable bowel syndrome)    Migraine without aura     Past Medical History, Surgical history, Social history, and Family history were reviewed and updated as appropriate.   Please see review of systems for further details on the patient's review from today.   Objective:   Physical Exam:  There were no vitals taken for this visit.  Physical Exam Constitutional:      General: She is not in acute distress. Musculoskeletal:        General: No deformity.  Neurological:     Mental Status: She is alert and oriented to person, place, and time.     Coordination: Coordination normal.  Psychiatric:        Attention and Perception: Attention and perception normal. She does not perceive auditory or visual hallucinations.        Mood and Affect: Mood normal. Mood is not anxious  or depressed. Affect is not labile, blunt, angry or inappropriate.        Speech: Speech normal.        Behavior: Behavior normal.        Thought Content: Thought content normal. Thought content is not paranoid or delusional. Thought content does not include homicidal or suicidal ideation. Thought content does not include homicidal or suicidal plan.        Cognition and Memory: Cognition and memory normal.        Judgment: Judgment normal.     Comments: Insight intact    Lab Review:     Component Value Date/Time  NA 139 03/28/2021 1131   NA 137 05/10/2019 1138   K 3.7 03/28/2021 1131   CL 105 03/28/2021 1131   CO2 27 03/28/2021 1131   GLUCOSE 100 (H) 03/28/2021 1131   BUN 7 03/28/2021 1131   BUN 8 05/10/2019 1138   CREATININE 0.72 03/28/2021 1131   CALCIUM 9.3 03/28/2021 1131   PROT 7.0 03/28/2021 1131   PROT 6.9 05/10/2019 1138   ALBUMIN 4.3 03/28/2021 1131   ALBUMIN 4.6 05/10/2019 1138   AST 16 03/28/2021 1131   ALT 13 03/28/2021 1131   ALKPHOS 42 03/28/2021 1131   BILITOT 0.6 03/28/2021 1131   BILITOT 0.3 05/10/2019 1138   GFRNONAA >60 03/28/2021 1131   GFRAA >60 10/15/2019 0909       Component Value Date/Time   WBC 6.5 03/28/2021 1131   RBC 4.84 03/28/2021 1131   HGB 14.5 03/28/2021 1131   HGB 12.4 08/28/2019 0919   HGB 13.3 09/23/2015 1638   HCT 43.9 03/28/2021 1131   HCT 37.8 08/28/2019 0919   PLT 292 03/28/2021 1131   PLT 226 08/28/2019 0919   MCV 90.7 03/28/2021 1131   MCV 89 08/28/2019 0919   MCH 30.0 03/28/2021 1131   MCHC 33.0 03/28/2021 1131   RDW 12.3 03/28/2021 1131   RDW 12.4 08/28/2019 0919   LYMPHSABS 2.2 03/28/2021 1131   LYMPHSABS 2.0 05/10/2019 1445   MONOABS 0.4 03/28/2021 1131   EOSABS 0.2 03/28/2021 1131   EOSABS 0.1 05/10/2019 1445   BASOSABS 0.0 03/28/2021 1131   BASOSABS 0.0 05/10/2019 1445    No results found for: POCLITH, LITHIUM   No results found for: PHENYTOIN, PHENOBARB, VALPROATE, CBMZ   .res Assessment: Plan:     Plan:  PDMP reviewed  Adderall 10mg  daily Adderall XR 30mg  daily Zoloft 200mg  daily Clonazepam 0.5mg  daily D/C Latuda 20mg  daily  Seeing therapist  RTC 4 weeks  Patient advised to contact office with any questions, adverse effects, or acute worsening in signs and symptoms.  Discussed potential benefits, risks, and side effects of stimulants with patient to include increased heart rate, palpitations, insomnia, increased anxiety, increased irritability, or decreased appetite.  Instructed patient to contact office if experiencing any significant tolerability issues.  Diagnoses and all orders for this visit:  Attention deficit hyperactivity disorder (ADHD), unspecified ADHD type -     amphetamine-dextroamphetamine (ADDERALL XR) 30 MG 24 hr capsule; Take 1 capsule (30 mg total) by mouth daily. -     amphetamine-dextroamphetamine (ADDERALL) 10 MG tablet; Take 1 tablet (10 mg total) by mouth daily.     Please see After Visit Summary for patient specific instructions.  Future Appointments  Date Time Provider [place]  11/16/2021  9:00 AM Anson [place], [facility] CP-CP None  11/30/2021  9:00 AM Anson [place], [facility] CP-CP None  12/14/2021  9:00 AM Anson [place], [facility] CP-CP None  12/29/2021  8:50 AM Roma Schanz, CNM [facility]    No orders of the defined types were placed in this encounter.   -------------------------------

## 2021-11-16 ENCOUNTER — Other Ambulatory Visit: Payer: Self-pay

## 2021-11-16 ENCOUNTER — Ambulatory Visit (INDEPENDENT_AMBULATORY_CARE_PROVIDER_SITE_OTHER): Payer: 59 | Admitting: Mental Health

## 2021-11-16 DIAGNOSIS — F422 Mixed obsessional thoughts and acts: Secondary | ICD-10-CM

## 2021-11-16 DIAGNOSIS — F909 Attention-deficit hyperactivity disorder, unspecified type: Secondary | ICD-10-CM

## 2021-11-16 NOTE — Progress Notes (Signed)
[facility] Psych[facility] Note  Name: Cristina Davenport Date: 11/16/21 MRN: [id number] DOB: 10/11/1986 PCP: Doree Albee, MD (Inactive)  Time spent: 51 minutes  Treatment: Individual therapy   Mental Status Exam:    Appearance:    Casual     Behavior:   Appropriate  Motor:   WNL  Speech/Language:    Clear and Coherent  Affect:   Full range   Mood:   Euthymic  Thought process:   normal  Thought content:     WNL  Sensory/Perceptual disturbances:     none  Orientation:   x4  Attention:   Good  Concentration:   Good  Memory:   Intact  Fund of knowledge:    Consistent with age and development  Insight:     Good  Judgment:    Good  Impulse Control:   Good    Reported Symptoms:  Sad and anxious feelings, irritability  Risk Assessment: Danger to Self:  No Self-injurious Behavior: No Danger to Others: No Duty to Warn:no Physical Aggression / Violence:No  Patient / guardian was educated about steps to take if suicide or homicide risk level increases between visits:  yes While future psychiatric events cannot be accurately predicted, the patient does not currently require acute inpatient psychiatric care and does not currently meet [facility] involuntary commitment criteria.   Subjective: Patient presents for session on time.  Patient shared progress since last visit.  She stated that she and her husband are getting along better going on to share some details and some of the recent communication.  She stated that they are handling their finances in a more collaborative manner as well which was some source of distress.  She went on to share details related to her mood, feeling more depressed recently, reports how she has gained some weight, feels she is coping with food.  She shares how she feels it could be medication related and plans to further discuss at her next medication management appointment.  She reports struggling with motivation, continues to work in Scientist, research (life sciences)  estate, trying to stay motivated day today to accomplish work-related tasks.  Facilitated her identifying needs where she was able to identify putting more strain more structure in her day to day more structure in her day, scheduling Tasks with time frames, trying to integrate exercise into her daily routine daily routine with structure and time frames as well.   Interventions: Supportive therapy, mot. interviewing   Diagnoses:    ICD-10-CM   1. Mixed obsessional thoughts and acts  F42.2     2. Attention deficit hyperactivity disorder (ADHD), unspecified ADHD type  F90.9            Plan: Patient to identify outlets for stress mgmt, utilize her support system, work to be mindful of thoughts that increase her anxiety and stress.  Patient to utilize a daily calendar to create a schedule and more structure to her day.  Long-term goal:   Improve feelings of self-worth / image Decrease tendency to "shut down" when upset Fully process and move past the pain suffered during childhood   Short-term goal:  Decrease "deflating, self-doubting" and catastrophizing thinking style Improve effective communication and coping when upset or agitated Decrease tendency to attempt to self validate by comparing herself to others    Assessment of progress:  progressing   This record has been created using [employer].  Chart creation errors have been sought, but may not always have been located and corrected.  Such creation errors do not reflect on the standard of medical care.    Anson [place], [facility]

## 2021-11-30 ENCOUNTER — Ambulatory Visit (INDEPENDENT_AMBULATORY_CARE_PROVIDER_SITE_OTHER): Payer: 59 | Admitting: Mental Health

## 2021-11-30 ENCOUNTER — Other Ambulatory Visit: Payer: Self-pay

## 2021-11-30 DIAGNOSIS — F909 Attention-deficit hyperactivity disorder, unspecified type: Secondary | ICD-10-CM | POA: Diagnosis not present
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
## 2021-12-03 DIAGNOSIS — F4312 Post-traumatic stress disorder, chronic: Secondary | ICD-10-CM | POA: Diagnosis not present

## 2021-12-03 DIAGNOSIS — F3131 Bipolar disorder, current episode depressed, mild: Secondary | ICD-10-CM | POA: Diagnosis not present

## 2021-12-03 DIAGNOSIS — F331 Major depressive disorder, recurrent, moderate: Secondary | ICD-10-CM | POA: Diagnosis not present

## 2021-12-03 DIAGNOSIS — F411 Generalized anxiety disorder: Secondary | ICD-10-CM | POA: Diagnosis not present

## 2021-12-07 ENCOUNTER — Telehealth: Payer: Self-pay

## 2021-12-07 NOTE — Telephone Encounter (Deleted)
° ° °  Amphetamine-Dextroamphet ER 30MG  er capsules

## 2021-12-07 NOTE — Telephone Encounter (Signed)
Prior authorization initiated for Amphetamine-Dextroamphetamine ER 30 mg capsule thru Cover My Meds  [employer]

## 2021-12-08 ENCOUNTER — Telehealth: Payer: Self-pay

## 2021-12-08 NOTE — Telephone Encounter (Addendum)
Initiated PA through Cover My Meds for Adderall 10 mg tablets [employer].  Received message for dispensing pharmacy to contact Pharmacy help line at [phone] for assistance.   Prior Authorization approved for Amphetamine-Dextroamphetamine ER 30 mg Effective 12/07/2021 - 12/06/2024.   Patient notified. [facility] [address] [place].

## 2021-12-10 DIAGNOSIS — F331 Major depressive disorder, recurrent, moderate: Secondary | ICD-10-CM | POA: Diagnosis not present

## 2021-12-10 DIAGNOSIS — F3131 Bipolar disorder, current episode depressed, mild: Secondary | ICD-10-CM | POA: Diagnosis not present

## 2021-12-10 DIAGNOSIS — F411 Generalized anxiety disorder: Secondary | ICD-10-CM | POA: Diagnosis not present

## 2021-12-10 DIAGNOSIS — F4312 Post-traumatic stress disorder, chronic: Secondary | ICD-10-CM | POA: Diagnosis not present

## 2021-12-13 ENCOUNTER — Other Ambulatory Visit: Payer: Self-pay

## 2021-12-13 ENCOUNTER — Encounter: Payer: Self-pay | Admitting: Adult Health

## 2021-12-13 ENCOUNTER — Ambulatory Visit (INDEPENDENT_AMBULATORY_CARE_PROVIDER_SITE_OTHER): Payer: 59 | Admitting: Adult Health

## 2021-12-13 ENCOUNTER — Telehealth: Payer: Self-pay | Admitting: Adult Health

## 2021-12-13 DIAGNOSIS — F909 Attention-deficit hyperactivity disorder, unspecified type: Secondary | ICD-10-CM

## 2021-12-13 DIAGNOSIS — F422 Mixed obsessional thoughts and acts: Secondary | ICD-10-CM

## 2021-12-13 DIAGNOSIS — F331 Major depressive disorder, recurrent, moderate: Secondary | ICD-10-CM | POA: Diagnosis not present

## 2021-12-13 DIAGNOSIS — F411 Generalized anxiety disorder: Secondary | ICD-10-CM | POA: Diagnosis not present

## 2021-12-13 MED ORDER — SERTRALINE HCL 100 MG PO TABS: ORAL_TABLET | ORAL | 1 refills | Status: DC

## 2021-12-13 MED ORDER — CLONAZEPAM 0.5 MG PO TABS: 0.5000 mg | ORAL_TABLET | Freq: Every day | ORAL | 2 refills | Status: DC

## 2021-12-13 NOTE — Progress Notes (Signed)
Cristina Davenport [id number] 03-29-1986 36 y.o.  Subjective:   Patient ID:  Cristina Davenport is a 36 y.o. (DOB 1986/06/15) female.  Chief Complaint: No chief complaint on file.   HPI Cristina Davenport presents to the office today for follow-up of ADHD, MDD, GAD, and obsessional thoughts and acts.   Describes mood today as "not too good". Pleasant. Mood symptoms - reports depression - some days - mostly comes from not having her daughter all the time. Feels anxious - always on some level. Denies irritability. Has stopped the [facility] and feels better without it. Working with two therapists. Improved interest and motivation. Taking medications as prescribed.  Energy levels improved. Active, does not have a regular exercise routine. Enjoys some usual interests and activities. Married. Lives with husband. Has 3 children - 12, 3, and 1. Family local. Spending time with family. Appetite adequate. Weight 137 pounds. Sleeps well most nights. Averages 7 to 9 hours.   Focus and concentration improved. Completing tasks. Managing aspects of household. 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  Denies AH or VH.  Previous medication trials: Zoloft, Hydroxyzine, Adderall XR 20mg  daily, Lamictal   GAD-7    Flowsheet Row Office Visit from 10/28/2020 in [place] from 06/19/2020 in [place] from 03/06/2020 in [place] Visit from 08/17/2018 in [place] Visit from 05/21/2018 in [place]  Total GAD-7 Score 19 17 21 11 20       PHQ2-9    [place] Office Visit from 01/19/2021 in [place] Visit from 10/28/2020 in [place] from 06/19/2020 in [place] from 03/06/2020 in [place] Initial Prenatal from 05/10/2019 in [facility]  PHQ-2 Total Score 6 1 1  0 0  PHQ-9 Total Score 18 7 8 15 4        Flowsheet Row Admission (Discharged) from 04/06/2021 in [place] [facility] from 04/02/2021 in [place] ED from 03/25/2021 in [place] No Risk No Risk Error: Question 6 not populated        Review of Systems:  Review of Systems  Musculoskeletal:  Negative for gait problem.  Neurological:  Negative for tremors.  Psychiatric/Behavioral:         Please refer to HPI   Medications: I have reviewed the patient's current medications.  Current Outpatient Medications  Medication Sig Dispense Refill   acetaminophen (TYLENOL) 500 MG tablet Take 500 mg by mouth 2 (two) times daily as needed for moderate pain.     amphetamine-dextroamphetamine (ADDERALL XR) 30 MG 24 hr capsule Take 1 capsule (30 mg total) by mouth daily. 30 capsule 0   amphetamine-dextroamphetamine (ADDERALL) 10 MG tablet Take 1 tablet (10 mg total) by mouth daily. 30 tablet 0   Apple Cider Vinegar 500 MG TABS Take 500 mg by mouth at bedtime.     AZO-CRANBERRY PO Take 1 tablet by mouth at bedtime.     Cholecalciferol (VITAMIN D3) 50 MCG (2000 UT) TABS Take 2,000 mg by mouth at bedtime.     clonazePAM (KLONOPIN) 0.5 MG tablet Take 1 tablet (0.5 mg total) by mouth at bedtime. 30 tablet 2   Cyanocobalamin (B-12) 5000 MCG CAPS Take 5,000 mcg by mouth daily.     docusate sodium (COLACE) 100 MG capsule Take 100 mg by mouth at bedtime.     Ferrous  Sulfate (IRON) 325 (65 Fe) MG TABS Take 325 mg by mouth at bedtime.     HYDROcodone-acetaminophen (NORCO) 5-325 MG tablet Take 1 tablet by mouth every 4 (four) hours as needed for moderate pain. 30 tablet 0   levonorgestrel (LILETTA, 52 MG,) 19.5 MCG/DAY IUD IUD 1 each by Intrauterine route once.     Lysine 1000 MG TABS Take 1,000 mg by mouth at bedtime.     Magnesium 400 MG TABS Take 400 mg by mouth daily.     Melatonin 5 MG CAPS Take 5 mg by mouth at bedtime as needed  (sleep).     methocarbamol (ROBAXIN) 500 MG tablet Take 1 tablet (500 mg total) by mouth every 6 (six) hours as needed for muscle tension/spasms in the daytime 60 tablet 1   NP THYROID 60 MG tablet Take 1 tablet (60 mg total) by mouth daily before breakfast. 30 tablet 3   progesterone (PROMETRIUM) 200 MG capsule Take 1 capsule (200 mg total) by mouth at bedtime. 90 capsule 1   sertraline (ZOLOFT) 100 MG tablet TAKE 2 TABLETS BY MOUTH EVERY DAY 180 tablet 0   No current facility-administered medications for this visit.    Medication Side Effects: None  Allergies:  Allergies  Allergen Reactions   Latex Other (See Comments)    General irritation   Doxycycline Rash    Solar rash    Past Medical History:  Diagnosis Date   Anxiety    Anxiety    Bipolar 1 disorder ([place])    Family history of adverse reaction to anesthesia    GERD (gastroesophageal reflux disease)    Hypertension    PIH   IBS (irritable bowel syndrome)    Migraine without aura     Past Medical History, Surgical history, Social history, and Family history were reviewed and updated as appropriate.   Please see review of systems for further details on the patient's review from today.   Objective:   Physical Exam:  There were no vitals taken for this visit.  Physical Exam Constitutional:      General: She is not in acute distress. Musculoskeletal:        General: No deformity.  Neurological:     Mental Status: She is alert and oriented to person, place, and time.     Coordination: Coordination normal.  Psychiatric:        Attention and Perception: Attention and perception normal. She does not perceive auditory or visual hallucinations.        Mood and Affect: Mood normal. Mood is not anxious or depressed. Affect is not labile, blunt, angry or inappropriate.        Speech: Speech normal.        Behavior: Behavior normal.        Thought Content: Thought content normal. Thought content is not paranoid or  delusional. Thought content does not include homicidal or suicidal ideation. Thought content does not include homicidal or suicidal plan.        Cognition and Memory: Cognition and memory normal.        Judgment: Judgment normal.     Comments: Insight intact    Lab Review:     Component Value Date/Time   NA 139 03/28/2021 1131   NA 137 05/10/2019 1138   K 3.7 03/28/2021 1131   CL 105 03/28/2021 1131   CO2 27 03/28/2021 1131   GLUCOSE 100 (H) 03/28/2021 1131   BUN 7 03/28/2021 1131   BUN 8 05/10/2019  1138   CREATININE 0.72 03/28/2021 1131   CALCIUM 9.3 03/28/2021 1131   PROT 7.0 03/28/2021 1131   PROT 6.9 05/10/2019 1138   ALBUMIN 4.3 03/28/2021 1131   ALBUMIN 4.6 05/10/2019 1138   AST 16 03/28/2021 1131   ALT 13 03/28/2021 1131   ALKPHOS 42 03/28/2021 1131   BILITOT 0.6 03/28/2021 1131   BILITOT 0.3 05/10/2019 1138   GFRNONAA >60 03/28/2021 1131   GFRAA >60 10/15/2019 0909       Component Value Date/Time   WBC 6.5 03/28/2021 1131   RBC 4.84 03/28/2021 1131   HGB 14.5 03/28/2021 1131   HGB 12.4 08/28/2019 0919   HGB 13.3 09/23/2015 1638   HCT 43.9 03/28/2021 1131   HCT 37.8 08/28/2019 0919   PLT 292 03/28/2021 1131   PLT 226 08/28/2019 0919   MCV 90.7 03/28/2021 1131   MCV 89 08/28/2019 0919   MCH 30.0 03/28/2021 1131   MCHC 33.0 03/28/2021 1131   RDW 12.3 03/28/2021 1131   RDW 12.4 08/28/2019 0919   LYMPHSABS 2.2 03/28/2021 1131   LYMPHSABS 2.0 05/10/2019 1445   MONOABS 0.4 03/28/2021 1131   EOSABS 0.2 03/28/2021 1131   EOSABS 0.1 05/10/2019 1445   BASOSABS 0.0 03/28/2021 1131   BASOSABS 0.0 05/10/2019 1445    No results found for: POCLITH, LITHIUM   No results found for: PHENYTOIN, PHENOBARB, VALPROATE, CBMZ   .res Assessment: Plan:    Plan:  PDMP reviewed  Adderall 10mg  daily Adderall XR 30mg  daily Zoloft 200mg  daily Clonazepam 0.5mg  daily  Seeing 2 therapists  RTC 4 weeks  Patient advised to contact office with any questions, adverse  effects, or acute worsening in signs and symptoms.  Discussed potential benefits, risks, and side effects of stimulants with patient to include increased heart rate, palpitations, insomnia, increased anxiety, increased irritability, or decreased appetite.  Instructed patient to contact office if experiencing any significant tolerability issues.  Diagnoses and all orders for this visit:  Mixed obsessional thoughts and acts  Major depressive disorder, recurrent episode, moderate (HCC)  Attention deficit hyperactivity disorder (ADHD), unspecified ADHD type  Generalized anxiety disorder     Please see After Visit Summary for patient specific instructions.  Future Appointments  Date Time Provider [place]  12/14/2021  9:00 AM Anson [place], [facility] CP-CP None  12/28/2021 10:00 AM Anson [place], [facility] CP-CP None  12/29/2021  8:50 AM Roma Schanz, CNM [facility]  01/11/2022 10:00 AM Anson [place], [facility] CP-CP None  01/25/2022 10:00 AM Anson [place], [facility] CP-CP None  02/08/2022 10:00 AM Anson [place], [facility] CP-CP None  02/22/2022 10:00 AM Anson [place], [facility] CP-CP None    No orders of the defined types were placed in this encounter.   -------------------------------

## 2021-12-13 NOTE — Telephone Encounter (Signed)
Called patient to verify dose of Adderall, as she last was taking 30 mg XR. I wasn't sure if she dropped dose due to issues with supply chain. She said she forgot she was now taking 30 mg XR and would call pharmacy to check availability and call back.

## 2021-12-13 NOTE — Telephone Encounter (Signed)
Patient was advised to call the office where to send Rx. Patient stated the pharmacy [place] has the Adderall XR 20 mg in stock.

## 2021-12-14 ENCOUNTER — Ambulatory Visit (INDEPENDENT_AMBULATORY_CARE_PROVIDER_SITE_OTHER): Payer: 59 | Admitting: Mental Health

## 2021-12-14 ENCOUNTER — Other Ambulatory Visit: Payer: Self-pay

## 2021-12-14 DIAGNOSIS — F422 Mixed obsessional thoughts and acts: Secondary | ICD-10-CM | POA: Diagnosis not present

## 2021-12-14 DIAGNOSIS — F909 Attention-deficit hyperactivity disorder, unspecified type: Secondary | ICD-10-CM

## 2021-12-14 MED ORDER — AMPHETAMINE-DEXTROAMPHET ER 30 MG PO CP24: 30.0000 mg | ORAL_CAPSULE | Freq: Every day | ORAL | 0 refills | Status: DC

## 2021-12-14 NOTE — Telephone Encounter (Signed)
Patient came in today to see CA and informed that her current pharmacy is out of the Adderall and she would like prescription sent to [facility]. Says she needs prescription to be written for 13 pill prescrition and one 17 pill prescription. Please rtc [phone]

## 2021-12-14 NOTE — Telephone Encounter (Signed)
Patient notified that Rx had been signed off on and sent to the requested pharmacy.

## 2021-12-14 NOTE — Progress Notes (Signed)
[facility] Psych[facility] Note  Name: KATERYNA GRANTHAM Date: 12/14/21 MRN: [id number] DOB: March 09, 1986 PCP: Doree Albee, MD (Inactive)  Time spent: 51 minutes  Treatment: Individual therapy   Mental Status Exam:    Appearance:    Casual     Behavior:   Appropriate  Motor:   WNL  Speech/Language:    Clear and Coherent  Affect:   Full range   Mood:   Euthymic  Thought process:   normal  Thought content:     WNL  Sensory/Perceptual disturbances:     none  Orientation:   x4  Attention:   Good  Concentration:   Good  Memory:   Intact  Fund of knowledge:    Consistent with age and development  Insight:     Good  Judgment:    Good  Impulse Control:   Good    Reported Symptoms:  Sad and anxious feelings, irritability  Risk Assessment: Danger to Self:  No Self-injurious Behavior: No Danger to Others: No Duty to Warn:no Physical Aggression / Violence:No  Patient / guardian was educated about steps to take if suicide or homicide risk level increases between visits:  yes While future psychiatric events cannot be accurately predicted, the patient does not currently require acute inpatient psychiatric care and does not currently meet [facility] involuntary commitment criteria.   Subjective: Patient presents for session on time for today's session.  She shared recent changes in progress.  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  At this point, she has about 3 years of experience in this field and shared some details related to her increasing her knowledge and competency.  She shared how she can have thoughts and feelings related to self-doubt, she identified potentially related to experiences in childhood from her parents.  She stated that her husband is away currently on a work training which lasts about 5 weeks.  She stated at this point has been gone for about 3 and recognizes this time apart can be  beneficial in their relationship.  She stated they have been getting along better over the past 2 months going on to share some interactions, recognizing one another's love languages and trying to be intentional about needing some of these needs for her husband. We reviewed some history related to identifying her tendency to compare herself to others for self validation and facilitated her identifying some self validating thoughts in session where she was able to identify her ability to thrive in her profession, the adaptable and resourceful as well as advocate for her needs and her marital relationship.   Interventions: Supportive therapy, mot. interviewing   Diagnoses:    ICD-10-CM   1. Mixed obsessional thoughts and acts  F42.2         Plan: Patient to identify outlets for stress mgmt, utilize her support system, work to be mindful of thoughts that increase her anxiety and stress.  Long-term goal:   Improve feelings of self-worth / image Decrease tendency to "shut down" when upset Fully process and move past the pain suffered during childhood   Short-term goal:  Decrease "deflating, self-doubting" and catastrophizing thinking style Improve effective communication and coping when upset or agitated Decrease tendency to attempt to self validate by comparing herself to others    Assessment of progress:  progressing   This record has been created using [employer].  Chart creation errors have been sought,  but may not always have been located and corrected. Such creation errors do not reflect on the standard of medical care.    Anson [place], [facility]

## 2021-12-24 DIAGNOSIS — F4312 Post-traumatic stress disorder, chronic: Secondary | ICD-10-CM | POA: Diagnosis not present

## 2021-12-24 DIAGNOSIS — F411 Generalized anxiety disorder: Secondary | ICD-10-CM | POA: Diagnosis not present

## 2021-12-24 DIAGNOSIS — F331 Major depressive disorder, recurrent, moderate: Secondary | ICD-10-CM | POA: Diagnosis not present

## 2021-12-24 DIAGNOSIS — F3131 Bipolar disorder, current episode depressed, mild: Secondary | ICD-10-CM | POA: Diagnosis not present

## 2021-12-27 ENCOUNTER — Other Ambulatory Visit: Payer: Self-pay

## 2021-12-27 ENCOUNTER — Telehealth: Payer: Self-pay | Admitting: Adult Health

## 2021-12-27 DIAGNOSIS — F909 Attention-deficit hyperactivity disorder, unspecified type: Secondary | ICD-10-CM

## 2021-12-27 MED ORDER — AMPHETAMINE-DEXTROAMPHET ER 30 MG PO CP24: 30.0000 mg | ORAL_CAPSULE | Freq: Every day | ORAL | 0 refills | Status: DC

## 2021-12-27 NOTE — Telephone Encounter (Signed)
Patient called in regarding prescription for Adderall 30mg . States she is having problems with insurance with current pharmacy [facility] and would like to cancel prescription sent [facility] [place] [place]. Please call if needed. Ph: [phone]. Appt 4/28

## 2021-12-27 NOTE — Telephone Encounter (Signed)
Pended.

## 2021-12-28 ENCOUNTER — Ambulatory Visit: Payer: 59 | Admitting: Mental Health

## 2021-12-28 NOTE — Telephone Encounter (Signed)
Pt called back to say that she needs NAME BRAND Adderall XR sent in. She takes the name brand per her report and the pharmacy has name brand in stock.[facility] [place] [place].

## 2021-12-29 ENCOUNTER — Other Ambulatory Visit (HOSPITAL_COMMUNITY)
Admission: RE | Admit: 2021-12-29 | Discharge: 2021-12-29 | Disposition: A | Discharge status: Home / Self Care | Payer: 59 | Source: Ambulatory Visit | Attending: Women's Health | Admitting: Women's Health

## 2021-12-29 ENCOUNTER — Other Ambulatory Visit: Payer: Self-pay

## 2021-12-29 ENCOUNTER — Ambulatory Visit (INDEPENDENT_AMBULATORY_CARE_PROVIDER_SITE_OTHER): Payer: 59 | Admitting: Women's Health

## 2021-12-29 ENCOUNTER — Encounter: Payer: Self-pay | Admitting: Women's Health

## 2021-12-29 VITALS — BP 113/73 | HR 89 | Ht 61.0 in | Wt 145.0 lb

## 2021-12-29 DIAGNOSIS — Z1329 Encounter for screening for other suspected endocrine disorder: Secondary | ICD-10-CM | POA: Diagnosis not present

## 2021-12-29 DIAGNOSIS — Z01419 Encounter for gynecological examination (general) (routine) without abnormal findings: Secondary | ICD-10-CM

## 2021-12-29 MED ORDER — TRICHLOROACETIC ACID 80 % EX LIQD: CUTANEOUS | 0 refills | Status: DC

## 2021-12-29 NOTE — Addendum Note (Signed)
Addended by: Octaviano Glow on: 12/29/2021 01:30 PM   Modules accepted: Orders

## 2021-12-29 NOTE — Telephone Encounter (Signed)
Called pharmacy.  They said that patient's formulary (Medicaid) prefers brand name and that an Rx had been filled and is awaiting her pickup.

## 2021-12-29 NOTE — Progress Notes (Signed)
WELL-WOMAN EXAMINATION Patient name: Cristina Davenport MRN [id number]  Date of birth: [birth year] Chief Complaint:   Annual Exam  History of Present Illness:   Cristina Davenport is a 36 y.o. [phone] Caucasian female being seen today for a routine well-woman exam.  Current complaints: 2 new vulvar condyloma, wants TCA tx Sees [facility] for therapy/mental health meds  PCP: none      does desire labs, no family h/o DM Patient's last menstrual period was 12/26/2021. The current method of family planning is IUD. Liletta 2021 Last pap 11/04/2016. Results were: NILM w/ HRHPV not done. H/O abnormal pap: yes LSIL 2015, then ASCUS 2016 Last mammogram: never. Results were: N/A. Family h/o breast cancer: no Last colonoscopy: never. Results were: N/A. Family h/o colorectal cancer: no  Depression screen [facility] 2/9 12/29/2021 01/19/2021 10/28/2020 06/19/2020 03/06/2020  Decreased Interest 2 3 0 0 0  Down, Depressed, Hopeless 2 3 1 1  0  PHQ - 2 Score 4 6 1 1  0  Altered sleeping 3 3 2 3 3   Tired, decreased energy 3 3 0 1 3  Change in appetite 2 2 0 0 3  Feeling bad or failure about yourself  2 1 1 1  0  Trouble concentrating 3 2 1 1 3   Moving slowly or fidgety/restless 2 1 2 1 3   Suicidal thoughts 0 0 0 0 0  PHQ-9 Score 19 18 7 8 15   Difficult doing work/chores - Somewhat difficult Somewhat difficult Somewhat difficult Somewhat difficult  Some recent data might be hidden     GAD 7 : Generalized Anxiety Score 12/29/2021 10/28/2020 06/19/2020 03/06/2020  Nervous, Anxious, on Edge 3 3 3 3   Control/stop worrying 3 3 3 3   Worry too much - different things 3 3 3 3   Trouble relaxing 3 3 1 3   Restless 1 1 3 3   Easily annoyed or irritable 3 3 3 3   Afraid - awful might happen 3 3 1 3   Total GAD 7 Score 19 19 17 21   Anxiety Difficulty - Somewhat difficult Very difficult Very difficult     Review of Systems:   Pertinent items are noted in HPI Denies any headaches, blurred vision, fatigue, shortness of breath,  chest pain, abdominal pain, abnormal vaginal discharge/itching/odor/irritation, problems with periods, bowel movements, urination, or intercourse unless otherwise stated above. Pertinent History Reviewed:  Reviewed past medical,surgical, social and family history.  Reviewed problem list, medications and allergies. Physical Assessment:   Vitals:   12/29/21 0919  BP: 113/73  Pulse: 89  Weight: 145 lb (65.8 kg)  Height: 5\' 1"  (1.549 m)  Body mass index is 27.4 kg/m.        Physical Examination:   General appearance - well appearing, and in no distress  Mental status - alert, oriented to person, place, and time  Psych:  She has a normal mood and affect  Skin - warm and dry, normal color, no suspicious lesions noted  Chest - effort normal, all lung fields clear to auscultation bilaterally  Heart - normal rate and regular rhythm  Neck:  midline trachea, no thyromegaly or nodules  Breasts - breasts appear normal, no suspicious masses, no skin or nipple changes or  axillary nodes  Abdomen - soft, nontender, nondistended, no masses or organomegaly  Pelvic - VULVA: normal appearing vulva with no masses, tenderness, flat condyloma and another condyloma Lt vulva  VAGINA: normal appearing vagina with normal color and discharge, no lesions  CERVIX: normal appearing cervix without discharge or  lesions, no CMT, IUD strings visible  Thin prep pap is done w/ HR HPV cotesting  UTERUS: uterus is felt to be normal size, shape, consistency and nontender   ADNEXA: No adnexal masses or tenderness noted.  Extremities:  No swelling or varicosities noted  Chaperone: Latisha Cresenzo    No results found for this or any previous visit (from the past 24 hour(s)).  Assessment & Plan:  1) Well-Woman Exam  2) Vulvar condyloma> rx TCA to [employer], f/u for application  Labs/procedures today: pap  Mammogram: @ 34yo, or sooner if problems Colonoscopy: @ 34yo, or sooner if problems  Orders Placed  This Encounter  Procedures   CBC   Comprehensive metabolic panel   TSH    Meds:  Meds ordered this encounter  Medications   trichloroacetic acid 80 % LIQD    Sig: Dispense for in office use    Dispense:  15 mL    Refill:  0    Order Specific Question:   Supervising Provider    Answer:   Florian Buff [[id number]]    Follow-up: Return for 1st available, TCA treatment.  [place], [facility] 12/29/2021 12:47 PM

## 2021-12-30 LAB — CBC
Hematocrit: 42.9 % (ref 34.0–46.6)
Hemoglobin: 14.7 g/dL (ref 11.1–15.9)
MCH: 30.3 pg (ref 26.6–33.0)
MCHC: 34.3 g/dL (ref 31.5–35.7)
MCV: 89 fL (ref 79–97)
Platelets: 260 10*3/uL (ref 150–450)
RBC: 4.85 x10E6/uL (ref 3.77–5.28)
RDW: 12.4 % (ref 11.7–15.4)
WBC: 5.1 10*3/uL (ref 3.4–10.8)

## 2021-12-30 LAB — COMPREHENSIVE METABOLIC PANEL
ALT: 18 IU/L (ref 0–32)
AST: 18 IU/L (ref 0–40)
Albumin/Globulin Ratio: 2.1 (ref 1.2–2.2)
Albumin: 4.9 g/dL — ABNORMAL HIGH (ref 3.8–4.8)
Alkaline Phosphatase: 61 IU/L (ref 44–121)
BUN/Creatinine Ratio: 13 (ref 9–23)
BUN: 9 mg/dL (ref 6–20)
Bilirubin Total: 0.3 mg/dL (ref 0.0–1.2)
CO2: 27 mmol/L (ref 20–29)
Calcium: 9.3 mg/dL (ref 8.7–10.2)
Chloride: 99 mmol/L (ref 96–106)
Creatinine, Ser: 0.7 mg/dL (ref 0.57–1.00)
Globulin, Total: 2.3 g/dL (ref 1.5–4.5)
Glucose: 84 mg/dL (ref 70–99)
Potassium: 4.1 mmol/L (ref 3.5–5.2)
Sodium: 137 mmol/L (ref 134–144)
Total Protein: 7.2 g/dL (ref 6.0–8.5)
eGFR: 117 mL/min/{1.73_m2} (ref >59)

## 2021-12-30 LAB — TSH: TSH: 1.11 u[IU]/mL (ref 0.450–4.500)

## 2021-12-31 ENCOUNTER — Ambulatory Visit (INDEPENDENT_AMBULATORY_CARE_PROVIDER_SITE_OTHER): Payer: 59 | Admitting: Women's Health

## 2021-12-31 ENCOUNTER — Other Ambulatory Visit: Payer: Self-pay

## 2021-12-31 ENCOUNTER — Encounter: Payer: Self-pay | Admitting: Women's Health

## 2021-12-31 ENCOUNTER — Other Ambulatory Visit (HOSPITAL_COMMUNITY)
Admission: RE | Admit: 2021-12-31 | Discharge: 2021-12-31 | Disposition: A | Discharge status: Home / Self Care | Payer: 59 | Source: Ambulatory Visit | Attending: Women's Health | Admitting: Women's Health

## 2021-12-31 VITALS — BP 113/68 | HR 95 | Ht 61.0 in | Wt 144.0 lb

## 2021-12-31 DIAGNOSIS — A630 Anogenital (venereal) warts: Secondary | ICD-10-CM | POA: Diagnosis not present

## 2021-12-31 NOTE — Progress Notes (Signed)
° °  GYN VISIT Patient name: Cristina Davenport MRN [id number]  Date of birth: [birth year] Chief Complaint:   TCA treatment  History of Present Illness:   Cristina Davenport is a 36 y.o. [phone] Caucasian female being seen today for TCA treatment for vulvar condyloma.    Patient's last menstrual period was 12/26/2021. The current method of family planning is IUD.  Last pap 12/29/21. Results were:  pending  Depression screen [facility] 2/9 12/29/2021 01/19/2021 10/28/2020 06/19/2020 03/06/2020  Decreased Interest 2 3 0 0 0  Down, Depressed, Hopeless 2 3 1 1  0  PHQ - 2 Score 4 6 1 1  0  Altered sleeping 3 3 2 3 3   Tired, decreased energy 3 3 0 1 3  Change in appetite 2 2 0 0 3  Feeling bad or failure about yourself  2 1 1 1  0  Trouble concentrating 3 2 1 1 3   Moving slowly or fidgety/restless 2 1 2 1 3   Suicidal thoughts 0 0 0 0 0  PHQ-9 Score 19 18 7 8 15   Difficult doing work/chores - Somewhat difficult Somewhat difficult Somewhat difficult Somewhat difficult  Some recent data might be hidden     GAD 7 : Generalized Anxiety Score 12/29/2021 10/28/2020 06/19/2020 03/06/2020  Nervous, Anxious, on Edge 3 3 3 3   Control/stop worrying 3 3 3 3   Worry too much - different things 3 3 3 3   Trouble relaxing 3 3 1 3   Restless 1 1 3 3   Easily annoyed or irritable 3 3 3 3   Afraid - awful might happen 3 3 1 3   Total GAD 7 Score 19 19 17 21   Anxiety Difficulty - Somewhat difficult Very difficult Very difficult     Review of Systems:   Pertinent items are noted in HPI Denies fever/chills, dizziness, headaches, visual disturbances, fatigue, shortness of breath, chest pain, abdominal pain, vomiting, abnormal vaginal discharge/itching/odor/irritation, problems with periods, bowel movements, urination, or intercourse unless otherwise stated above.  Pertinent History Reviewed:  Reviewed past medical,surgical, social, obstetrical and family history.  Reviewed problem list, medications and allergies. Physical Assessment:    Vitals:   12/31/21 1219  BP: 113/68  Pulse: 95  Weight: 144 lb (65.3 kg)  Height: 5\' 1"  (1.549 m)  Body mass index is 27.21 kg/m.       Physical Examination:   General appearance: alert, well appearing, and in no distress  Mental status: alert, oriented to person, place, and time  Skin: warm & dry   Cardiovascular: normal heart rate noted  Respiratory: normal respiratory effort, no distress  Abdomen: soft, non-tender   Pelvic: 1 flat condyloma Lt vulva-TCA applied, 1 pedunculated condyloma Lt upper labia-TCA applied, not uptaking well, discussed removal and send to path- pt agrees. Injected base w/ 1cc 1% lidocaine, clamped base w/ hemostats, then condyloma excised w/ 11 blade scalpel- sent to path  Extremities: no edema   Chaperone: Latisha Cresenzo    No results found for this or any previous visit (from the past 24 hour(s)).  Assessment & Plan:  1) TCA treatment for vulvar condyloma  2) Excision of 1 vulvar condyloma> sent to path  Meds: No orders of the defined types were placed in this encounter.   No orders of the defined types were placed in this encounter.   Return in about 2 weeks (around 01/14/2022) for TCA treatment.  [place], [facility] 12/31/2021 1:38 PM

## 2022-01-04 LAB — SURGICAL PATHOLOGY

## 2022-01-05 LAB — CYTOLOGY - PAP
Comment: NEGATIVE
Diagnosis: NEGATIVE
Diagnosis: REACTIVE
High risk HPV: NEGATIVE

## 2022-01-11 ENCOUNTER — Other Ambulatory Visit: Payer: Self-pay

## 2022-01-11 ENCOUNTER — Ambulatory Visit (INDEPENDENT_AMBULATORY_CARE_PROVIDER_SITE_OTHER): Payer: 59 | Admitting: Mental Health

## 2022-01-11 DIAGNOSIS — F422 Mixed obsessional thoughts and acts: Secondary | ICD-10-CM | POA: Diagnosis not present
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
## 2022-01-17 ENCOUNTER — Ambulatory Visit (INDEPENDENT_AMBULATORY_CARE_PROVIDER_SITE_OTHER): Payer: 59 | Admitting: Women's Health

## 2022-01-17 ENCOUNTER — Other Ambulatory Visit: Payer: Self-pay

## 2022-01-17 ENCOUNTER — Encounter: Payer: Self-pay | Admitting: Women's Health

## 2022-01-17 VITALS — BP 118/80 | HR 83 | Wt 146.0 lb

## 2022-01-17 DIAGNOSIS — A630 Anogenital (venereal) warts: Secondary | ICD-10-CM

## 2022-01-17 NOTE — Progress Notes (Signed)
? ?  GYN VISIT ?Patient name: Cristina Davenport MRN [id number]  Date of birth: Sep 04, 1986 ?Chief Complaint:   ?Gynecologic Exam ? ?History of Present Illness:   ?Cristina Davenport is a 36 y.o. [phone] Caucasian female being seen today for 2nd TCA treatment for vulvar condyloma.    ?Patient's last menstrual period was 01/17/2022 (exact date). ?The current method of family planning is IUD.  ?Last pap 12/29/21. Results were: NILM w/ HRHPV negative ? ?Depression screen [facility] 2/9 12/29/2021 01/19/2021 10/28/2020 06/19/2020 03/06/2020  ?Decreased Interest 2 3 0 0 0  ?Down, Depressed, Hopeless '2 3 1 1 '$ 0  ?PHQ - 2 Score '4 6 1 1 '$ 0  ?Altered sleeping '3 3 2 3 3  '$ ?Tired, decreased energy 3 3 0 1 3  ?Change in appetite 2 2 0 0 3  ?Feeling bad or failure about yourself  '2 1 1 1 '$ 0  ?Trouble concentrating '3 2 1 1 3  '$ ?Moving slowly or fidgety/restless '2 1 2 1 3  '$ ?Suicidal thoughts 0 0 0 0 0  ?PHQ-9 Score '19 18 7 8 15  '$ ?Difficult doing work/chores - Somewhat difficult Somewhat difficult Somewhat difficult Somewhat difficult  ?Some recent data might be hidden  ? ?  ?GAD 7 : Generalized Anxiety Score 12/29/2021 10/28/2020 06/19/2020 03/06/2020  ?Nervous, Anxious, on Edge '3 3 3 3  '$ ?Control/stop worrying '3 3 3 3  '$ ?Worry too much - different things '3 3 3 3  '$ ?Trouble relaxing '3 3 1 3  '$ ?Restless '1 1 3 3  '$ ?Easily annoyed or irritable '3 3 3 3  '$ ?Afraid - awful might happen '3 3 1 3  '$ ?Total GAD 7 Score '19 19 17 21  '$ ?Anxiety Difficulty - Somewhat difficult Very difficult Very difficult  ? ? ? ?Review of Systems:   ?Pertinent items are noted in HPI ?Denies fever/chills, dizziness, headaches, visual disturbances, fatigue, shortness of breath, chest pain, abdominal pain, vomiting, abnormal vaginal discharge/itching/odor/irritation, problems with periods, bowel movements, urination, or intercourse unless otherwise stated above.  ?Pertinent History Reviewed:  ?Reviewed past medical,surgical, social, obstetrical and family history.  ?Reviewed problem list, medications and  allergies. ?Physical Assessment:  ? ?Vitals:  ? 01/17/22 1342  ?BP: 118/80  ?Pulse: 83  ?Weight: 146 lb (66.2 kg)  ?Body mass index is 27.59 kg/m?. ? ?     Physical Examination:  ? General appearance: alert, well appearing, and in no distress ? Mental status: alert, oriented to person, place, and time ? Skin: warm & dry  ? Cardiovascular: normal heart rate noted ? Respiratory: normal respiratory effort, no distress ? Abdomen: soft, non-tender  ? Pelvic: unable to see any condyloma, pt pointed to spot Lt vulva, barely visible, TCA applied ? Extremities: no edema  ? ?Chaperone:  Malissa Hippo, CMA    ? ?No results found for this or any previous visit (from the past 24 hour(s)).  ?Assessment & Plan:  ?1) Vulvar condyloma> 2nd TCA treatment, no more needed ? ? ?Meds: No orders of the defined types were placed in this encounter. ? ? ?No orders of the defined types were placed in this encounter. ? ? ?Return in about 1 year (around 01/18/2023) for Physical. ? ?Roma Schanz CNM, WHNP-BC ?01/17/2022 ?2:09 PM  ?

## 2022-01-18 ENCOUNTER — Telehealth: Payer: Self-pay | Admitting: Adult Health

## 2022-01-18 ENCOUNTER — Other Ambulatory Visit: Payer: Self-pay

## 2022-01-18 DIAGNOSIS — F909 Attention-deficit hyperactivity disorder, unspecified type: Secondary | ICD-10-CM

## 2022-01-18 MED ORDER — AMPHETAMINE-DEXTROAMPHETAMINE 10 MG PO TABS: 10.0000 mg | ORAL_TABLET | Freq: Every day | ORAL | 0 refills | Status: DC

## 2022-01-18 NOTE — Telephone Encounter (Signed)
Next visit is 03/11/22. Cristina Davenport is requesting a refill on her Dextroamphetamine 10 mg called to: ? ?[facility] [place], [place] ? ?Phone:  [phone]  ?Fax:  [phone]  ? ? ? ? ? ?

## 2022-01-18 NOTE — Telephone Encounter (Signed)
Pended.

## 2022-01-19 DIAGNOSIS — F4312 Post-traumatic stress disorder, chronic: Secondary | ICD-10-CM | POA: Diagnosis not present

## 2022-01-19 DIAGNOSIS — F3131 Bipolar disorder, current episode depressed, mild: Secondary | ICD-10-CM | POA: Diagnosis not present

## 2022-01-19 DIAGNOSIS — F411 Generalized anxiety disorder: Secondary | ICD-10-CM | POA: Diagnosis not present

## 2022-01-19 DIAGNOSIS — F331 Major depressive disorder, recurrent, moderate: Secondary | ICD-10-CM | POA: Diagnosis not present

## 2022-01-25 ENCOUNTER — Ambulatory Visit: Payer: 59 | Admitting: Mental Health

## 2022-01-28 DIAGNOSIS — F4312 Post-traumatic stress disorder, chronic: Secondary | ICD-10-CM | POA: Diagnosis not present

## 2022-01-28 DIAGNOSIS — F3131 Bipolar disorder, current episode depressed, mild: Secondary | ICD-10-CM | POA: Diagnosis not present

## 2022-01-28 DIAGNOSIS — F411 Generalized anxiety disorder: Secondary | ICD-10-CM | POA: Diagnosis not present

## 2022-01-31 ENCOUNTER — Other Ambulatory Visit: Payer: Self-pay

## 2022-01-31 ENCOUNTER — Telehealth: Payer: Self-pay | Admitting: Adult Health

## 2022-01-31 DIAGNOSIS — F909 Attention-deficit hyperactivity disorder, unspecified type: Secondary | ICD-10-CM

## 2022-01-31 MED ORDER — AMPHETAMINE-DEXTROAMPHET ER 30 MG PO CP24: 30.0000 mg | ORAL_CAPSULE | Freq: Every day | ORAL | 0 refills | Status: DC

## 2022-01-31 NOTE — Telephone Encounter (Signed)
Pt called in and requested that we fill her Adderall XR '30mg'$  [facility] [place] [place]  ?[address], [address]  ?[address] ? ?

## 2022-01-31 NOTE — Telephone Encounter (Signed)
Pended.

## 2022-02-02 ENCOUNTER — Telehealth: Payer: Self-pay | Admitting: Adult Health

## 2022-02-02 DIAGNOSIS — F909 Attention-deficit hyperactivity disorder, unspecified type: Secondary | ICD-10-CM

## 2022-02-02 MED ORDER — ADDERALL XR 30 MG PO CP24: 30.0000 mg | ORAL_CAPSULE | Freq: Every day | ORAL | 0 refills | Status: DC

## 2022-02-02 NOTE — Telephone Encounter (Signed)
Ok to pend.

## 2022-02-02 NOTE — Telephone Encounter (Signed)
Pt called at 11:00 reporting Adderall XR 30 mg generic Rx she picked up is not working for her. She has only taken 1 pill. She usually gets BRAND. Pt requesting a Rx for BRAND Adderall XR [facility] [place] Has in stock. Contact # for Pt [phone] ?

## 2022-02-02 NOTE — Telephone Encounter (Signed)
Pt picked up generic on 3/20 please advise if she can get a new rx for brand

## 2022-02-02 NOTE — Telephone Encounter (Signed)
Script sent  

## 2022-02-08 ENCOUNTER — Other Ambulatory Visit: Payer: Self-pay

## 2022-02-08 ENCOUNTER — Ambulatory Visit (INDEPENDENT_AMBULATORY_CARE_PROVIDER_SITE_OTHER): Payer: 59 | Admitting: Mental Health

## 2022-02-08 DIAGNOSIS — F422 Mixed obsessional thoughts and acts: Secondary | ICD-10-CM
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
## 2022-02-22 ENCOUNTER — Telehealth: Payer: Self-pay | Admitting: Adult Health

## 2022-02-22 ENCOUNTER — Other Ambulatory Visit: Payer: Self-pay | Admitting: Adult Health

## 2022-02-22 ENCOUNTER — Ambulatory Visit: Payer: 59 | Admitting: Mental Health

## 2022-02-22 DIAGNOSIS — F909 Attention-deficit hyperactivity disorder, unspecified type: Secondary | ICD-10-CM

## 2022-02-22 MED ORDER — AMPHETAMINE-DEXTROAMPHETAMINE 10 MG PO TABS: 10.0000 mg | ORAL_TABLET | Freq: Every day | ORAL | 0 refills | Status: DC

## 2022-02-22 NOTE — Telephone Encounter (Signed)
Script sent  

## 2022-02-22 NOTE — Telephone Encounter (Signed)
Pt called requesting [facility]der[facility] tablet [facility] [place]  ?

## 2022-03-02 ENCOUNTER — Telehealth: Payer: Self-pay | Admitting: Adult Health

## 2022-03-02 ENCOUNTER — Other Ambulatory Visit: Payer: Self-pay | Admitting: Adult Health

## 2022-03-02 DIAGNOSIS — F909 Attention-deficit hyperactivity disorder, unspecified type: Secondary | ICD-10-CM

## 2022-03-02 MED ORDER — ADDERALL XR 30 MG PO CP24: 30.0000 mg | ORAL_CAPSULE | Freq: Every day | ORAL | 0 refills | Status: DC

## 2022-03-02 NOTE — Telephone Encounter (Signed)
Pt called at 11:47 am asking for a refill of her adderall  xr 30 mg. She needs brand [facility] [place] [facility] [address] [facility] [place]. She took her last pill today.Nextappt4/28 ?

## 2022-03-02 NOTE — Telephone Encounter (Signed)
Script sent  

## 2022-03-04 ENCOUNTER — Ambulatory Visit: Payer: 59 | Admitting: Adult Health

## 2022-03-10 ENCOUNTER — Ambulatory Visit: Payer: 59 | Admitting: Adult Health

## 2022-03-11 ENCOUNTER — Ambulatory Visit: Payer: 59 | Admitting: Adult Health

## 2022-03-16 ENCOUNTER — Ambulatory Visit (INDEPENDENT_AMBULATORY_CARE_PROVIDER_SITE_OTHER): Payer: 59 | Admitting: Adult Health

## 2022-03-16 ENCOUNTER — Encounter: Payer: Self-pay | Admitting: Adult Health

## 2022-03-16 DIAGNOSIS — F331 Major depressive disorder, recurrent, moderate: Secondary | ICD-10-CM | POA: Diagnosis not present

## 2022-03-16 DIAGNOSIS — F422 Mixed obsessional thoughts and acts: Secondary | ICD-10-CM | POA: Diagnosis not present

## 2022-03-16 DIAGNOSIS — F909 Attention-deficit hyperactivity disorder, unspecified type: Secondary | ICD-10-CM | POA: Diagnosis not present

## 2022-03-16 DIAGNOSIS — F411 Generalized anxiety disorder: Secondary | ICD-10-CM | POA: Diagnosis not present

## 2022-03-16 MED ORDER — CLONAZEPAM 0.5 MG PO TABS: 0.5000 mg | ORAL_TABLET | Freq: Every day | ORAL | 2 refills | Status: DC

## 2022-03-16 MED ORDER — AMPHETAMINE-DEXTROAMPHETAMINE 10 MG PO TABS: 10.0000 mg | ORAL_TABLET | Freq: Every day | ORAL | 0 refills | Status: DC

## 2022-03-16 MED ORDER — AMPHETAMINE-DEXTROAMPHET ER 30 MG PO CP24: 30.0000 mg | ORAL_CAPSULE | Freq: Every day | ORAL | 0 refills | Status: DC

## 2022-03-16 NOTE — Progress Notes (Signed)
Cristina Davenport ?[id number] ?31-Oct-1986 ?36 y.o. ? ?Subjective:  ? ?Patient ID:  Cristina Davenport is a 36 y.o. (DOB [birth year]) female. ? ?Chief Complaint: No chief complaint on file. ? ? ?HPI ?Cristina Davenport presents to the office today for follow-up of ADHD, MDD, GAD, and obsessional thoughts and acts.  ? ?Describes mood today as "not too good". Pleasant. Mood symptoms - denies depression and anxiety. Feels irritable at times. Mood is mostly consistent. Stating "I'm doing alright". Feels like medications are working well. Improved interest and motivation. Taking medications as prescribed.  ?Energy levels stable. Active, has a regular exercise routine. ?Enjoys some usual interests and activities. Married. Lives with husband. Has 3 children. Family local. Spending time with family. ?Appetite adequate. Weight 146 pounds. ?Sleeps well most nights. Averages 7 to 9 hours.   ?Focus and concentration improved. Completing tasks. Managing aspects of household. Works in Personal assistant. ?Denies SI or HI.  ?Denies AH or VH. ? ?Previous medication trials: Zoloft, Hydroxyzine, Adderall XR '20mg'$  daily, Lamictal ? ? ?AUDIT   ? ?[place] Office Visit from 12/29/2021 in [facility]  ?Alcohol Use Disorder Identification Test Final Score (AUDIT) 4  ? ?  ? ?GAD-7   ? ?[facility] Office Visit from 12/29/2021 in [facility] Office Visit from 10/28/2020 in [facility] from 06/19/2020 in [facility] from 03/06/2020 in [facility] Visit from 08/17/2018 in [facility]  ?Total GAD-7 Score '19 19 17 21 11  '$ ? ?  ? ?PHQ2-9   ? ?[facility] Office Visit from 12/29/2021 in [facility] Office Visit from 01/19/2021 in [facility] Office Visit from 10/28/2020 in [facility] from 06/19/2020 in [facility] from 03/06/2020 in [facility]  ?PHQ-2 Total Score '4 6  1 1 '$ 0  ?PHQ-9 Total Score '19 18 7 8 15  '$ ? ?  ? ?Flowsheet Row Admission (Discharged) from 04/06/2021 in [facility] from 04/02/2021 in [facility] from 03/25/2021 in [facility]  ?C-SSRS RISK CATEGORY No Risk No Risk Error: Question 6 not populated  ? ?  ?  ? ?Review of Systems:  ?Review of Systems  ?Musculoskeletal:  Negative for gait problem.  ?Neurological:  Negative for tremors.  ?Psychiatric/Behavioral:    ?     Please refer to HPI  ? ?Medications: I have reviewed the patient's current medications. ? ?Current Outpatient Medications  ?Medication Sig Dispense Refill  ? acetaminophen (TYLENOL) 500 MG tablet Take 500 mg by mouth 2 (two) times daily as needed for moderate pain. (Patient not taking: Reported on 12/29/2021)    ? ADDERALL XR 30 MG 24 hr capsule Take 1 capsule (30 mg total) by mouth daily. 30 capsule 0  ? amphetamine-dextroamphetamine (ADDERALL XR) 30 MG 24 hr capsule Take 1 capsule (30 mg total) by mouth daily. 30 capsule 0  ? amphetamine-dextroamphetamine (ADDERALL) 10 MG tablet Take 1 tablet (10 mg total) by mouth daily. 30 tablet 0  ? clonazePAM (KLONOPIN) 0.5 MG tablet Take 0.5 mg by mouth at bedtime.    ? Cyanocobalamin (B-12) 5000 MCG CAPS Take 5,000 mcg by mouth daily. (Patient not taking: Reported on 01/17/2022)    ? levonorgestrel (LILETTA, 52 MG,) 19.5 MCG/DAY IUD IUD 1 each by Intrauterine route once.    ? Magnesium 400 MG TABS Take 400 mg by mouth daily. (Patient not  taking: Reported on 01/17/2022)    ? sertraline (ZOLOFT) 100 MG tablet TAKE 2 TABLETS BY MOUTH EVERY DAY 180 tablet 1  ? trichloroacetic acid 80 % LIQD Dispense for in office use (Patient not taking: Reported on 01/17/2022) 15 mL 0  ? ?No current facility-administered medications for this visit.  ? ? ?Medication Side Effects: None ? ?Allergies:  ?Allergies  ?Allergen Reactions  ? Latex Other (See Comments)  ?  General irritation  ?  Doxycycline Rash  ?  Solar rash  ? ? ?Past Medical History:  ?Diagnosis Date  ? Anxiety   ? Anxiety   ? Bipolar 1 disorder ([place])   ? Family history of adverse reaction to anesthesia   ? GERD (gastroesophageal reflux disease)   ? Hypertension   ? PIH  ? IBS (irritable bowel syndrome)   ? Migraine without aura   ? ? ?Past Medical History, Surgical history, Social history, and Family history were reviewed and updated as appropriate.  ? ?Please see review of systems for further details on the patient's review from today.  ? ?Objective:  ? ?Physical Exam:  ?There were no vitals taken for this visit. ? ?Physical Exam ?Constitutional:   ?   General: She is not in acute distress. ?Musculoskeletal:     ?   General: No deformity.  ?Neurological:  ?   Mental Status: She is alert and oriented to person, place, and time.  ?   Coordination: Coordination normal.  ?Psychiatric:     ?   Attention and Perception: Attention and perception normal. She does not perceive auditory or visual hallucinations.     ?   Mood and Affect: Mood normal. Mood is not anxious or depressed. Affect is not labile, blunt, angry or inappropriate.     ?   Speech: Speech normal.     ?   Behavior: Behavior normal.     ?   Thought Content: Thought content normal. Thought content is not paranoid or delusional. Thought content does not include homicidal or suicidal ideation. Thought content does not include homicidal or suicidal plan.     ?   Cognition and Memory: Cognition and memory normal.     ?   Judgment: Judgment normal.  ?   Comments: Insight intact  ? ? ?Lab Review:  ?   ?Component Value Date/Time  ? NA 137 12/29/2021 0959  ? K 4.1 12/29/2021 0959  ? CL 99 12/29/2021 0959  ? CO2 27 12/29/2021 0959  ? GLUCOSE 84 12/29/2021 0959  ? GLUCOSE 100 (H) 03/28/2021 1131  ? BUN 9 12/29/2021 0959  ? CREATININE 0.70 12/29/2021 0959  ? CALCIUM 9.3 12/29/2021 0959  ? PROT 7.2 12/29/2021 0959  ? ALBUMIN 4.9 (H) 12/29/2021 0959  ? AST 18 12/29/2021 0959  ? ALT 18  12/29/2021 0959  ? ALKPHOS 61 12/29/2021 0959  ? BILITOT 0.3 12/29/2021 0959  ? GFRNONAA >60 03/28/2021 1131  ? GFRAA >60 10/15/2019 0909  ? ? ?   ?Component Value Date/Time  ? WBC 5.1 12/29/2021 0959  ? WBC 6.5 03/28/2021 1131  ? RBC 4.85 12/29/2021 0959  ? RBC 4.84 03/28/2021 1131  ? HGB 14.7 12/29/2021 0959  ? HGB 13.3 09/23/2015 1638  ? HCT 42.9 12/29/2021 0959  ? PLT 260 12/29/2021 0959  ? MCV 89 12/29/2021 0959  ? MCH 30.3 12/29/2021 0959  ? MCH 30.0 03/28/2021 1131  ? MCHC 34.3 12/29/2021 0959  ? MCHC 33.0 03/28/2021 1131  ? RDW 12.4 12/29/2021 0959  ?  LYMPHSABS 2.2 03/28/2021 1131  ? LYMPHSABS 2.0 05/10/2019 1445  ? MONOABS 0.4 03/28/2021 1131  ? EOSABS 0.2 03/28/2021 1131  ? EOSABS 0.1 05/10/2019 1445  ? BASOSABS 0.0 03/28/2021 1131  ? BASOSABS 0.0 05/10/2019 1445  ? ? ?No results found for: POCLITH, LITHIUM  ? ?No results found for: PHENYTOIN, PHENOBARB, VALPROATE, CBMZ  ? ?.res ?Assessment: Plan:   ? ?Plan: ? ?PDMP reviewed ? ?Adderall '10mg'$  daily ?Adderall XR '30mg'$  daily ?Zoloft '200mg'$  daily ?Clonazepam 0.'5mg'$  daily ? ?Seeing 2 therapists ? ?RTC 4 weeks ? ?Patient advised to contact office with any questions, adverse effects, or acute worsening in signs and symptoms. ? ?Discussed potential benefits, risks, and side effects of stimulants with patient to include increased heart rate, palpitations, insomnia, increased anxiety, increased irritability, or decreased appetite.  Instructed patient to contact office if experiencing any significant tolerability issues. ?Diagnoses and all orders for this visit: ? ?Attention deficit hyperactivity disorder (ADHD), unspecified ADHD type ? ?  ? ?Please see After Visit Summary for patient specific instructions. ? ?No future appointments. ? ?No orders of the defined types were placed in this encounter. ? ? ?------------------------------- ?

## 2022-04-04 ENCOUNTER — Other Ambulatory Visit: Payer: Self-pay | Admitting: Adult Health

## 2022-04-04 ENCOUNTER — Telehealth: Payer: Self-pay | Admitting: Adult Health

## 2022-04-04 DIAGNOSIS — F909 Attention-deficit hyperactivity disorder, unspecified type: Secondary | ICD-10-CM

## 2022-04-04 MED ORDER — ADDERALL XR 30 MG PO CP24: 30.0000 mg | ORAL_CAPSULE | Freq: Every day | ORAL | 0 refills | Status: DC

## 2022-04-04 NOTE — Telephone Encounter (Signed)
Pt need Adderall XR script sent back to the pharmacy marked as "name brand" only.  Generic does not work for her.  [facility] [address] in [place].  Next appt 8/3

## 2022-04-04 NOTE — Telephone Encounter (Signed)
Script sent for brand name.

## 2022-04-28 ENCOUNTER — Other Ambulatory Visit: Payer: Self-pay

## 2022-04-28 ENCOUNTER — Telehealth: Payer: Self-pay | Admitting: Adult Health

## 2022-04-28 DIAGNOSIS — F909 Attention-deficit hyperactivity disorder, unspecified type: Secondary | ICD-10-CM

## 2022-04-28 MED ORDER — AMPHETAMINE-DEXTROAMPHETAMINE 10 MG PO TABS: 10.0000 mg | ORAL_TABLET | Freq: Every day | ORAL | 0 refills | Status: DC

## 2022-04-28 NOTE — Telephone Encounter (Signed)
Pended.

## 2022-04-28 NOTE — Telephone Encounter (Signed)
Pt requested refill of Adderall '10mg'$  to   [facility], [place]  [address], [place] [address]  Phone:  [phone]  Fax:  [phone]   Next appt 8/3

## 2022-05-04 ENCOUNTER — Telehealth: Payer: Self-pay | Admitting: Adult Health

## 2022-05-04 ENCOUNTER — Other Ambulatory Visit: Payer: Self-pay

## 2022-05-04 DIAGNOSIS — F909 Attention-deficit hyperactivity disorder, unspecified type: Secondary | ICD-10-CM

## 2022-05-04 MED ORDER — ADDERALL XR 30 MG PO CP24: 30.0000 mg | ORAL_CAPSULE | Freq: Every day | ORAL | 0 refills | Status: DC

## 2022-05-04 NOTE — Telephone Encounter (Signed)
Pended.

## 2022-05-04 NOTE — Telephone Encounter (Signed)
Patient lvm requesting a refill on the Adderall XR 30 mg.  Fill at [facility], [place]  [address], [place] [address]  Phone:  [phone]  Fax:  [phone]   Follow up appointment scheduled for 8/3.

## 2022-05-11 ENCOUNTER — Telehealth: Payer: Self-pay | Admitting: Family Medicine

## 2022-05-11 NOTE — Telephone Encounter (Signed)
Patient called stating that she is having pain in her stomach area and she has not eaten in 2 days because of the pain. She says it hurts around where her surgery was done, stomach area and worse after she eats anything. She would compare her pain to that of labor contractions. I informed her that she needed to see her PCP for workup as Dr. Arnoldo Morale is out of town this week. She does not have a PCP as hers passed away and she has not gotten another. I recommended that she could go to an urgent care or she could go to the ER. She states that she can stay at home in pain the same as sitting in the ER in pain. I did inform her that we do have a MD on call if she goes to the ER and needs a Psychologist, sport and exercise.  Patient then states that she has a GI and will contact them for an appt.

## 2022-06-04 DIAGNOSIS — F411 Generalized anxiety disorder: Secondary | ICD-10-CM | POA: Diagnosis not present

## 2022-06-04 DIAGNOSIS — F3131 Bipolar disorder, current episode depressed, mild: Secondary | ICD-10-CM | POA: Diagnosis not present

## 2022-06-04 DIAGNOSIS — F4312 Post-traumatic stress disorder, chronic: Secondary | ICD-10-CM | POA: Diagnosis not present

## 2022-06-06 NOTE — Progress Notes (Unsigned)
06/07/2022 Cristina Davenport [id number] [birth year]  Referring provider: No ref. provider found Primary GI doctor: Dr. Rush Landmark requesting to see him as her mom sees him as well (Dr. Raylene Miyamoto. Gala [facility] at [place])  [place]:  36 year old female with history of epigastric pain and constipation for years, status post cholecystectomy which actually worsen constipation, previous incisional hernia repair which did not improve constipation presents for worsening symptoms. Patient tried several medications in the past including Amitiza, Linzess, MiraLAX, Dulcolax.  We will do trial of Motegrity. Previous endoscopy 2014 showed abnormal distal esophagus, proved to be reflux. We will start patient on pantoprazole once daily for epigastric pain.  Check labs After long discussion with the patient she is very frustrated was post to have a colonoscopy in the past with [facility] but had to cancel due to pregnancy. We will go ahead and schedule EGD and colonoscopy to evaluate for intraluminal pathology but I do think this is most likely slow motility constipation versus IBS C and the Motegrity may help. If not improving can consider SIBO testing for methane producing bacteria patient's not interested in staying on medications long-term.  Patient will need 2-day prep I recommend upper gastrointestinal and colorectal evaluation with an EGD and colonoscopy. Risk of bowel prep, conscious sedation, and EGD and colonoscopy were discussed. Risks include but are not limited to dehydration, pain, bleeding, cardiopulmonary process, bowel perforation, or other possible adverse outcomes.. Treatment plan was discussed with patient, and agreed upon.  -     DG Abd 1 View; Future -     CBC with Differential/Platelet; Future -     Comprehensive metabolic panel; Future -     TSH; Future -     Sedimentation rate; Future -     High sensitivity CRP; Future -     Tissue transglutaminase, IgA; Future -      IgA; Future -     H. pylori antibody, IgG; Future -     pantoprazole (PROTONIX) 40 MG tablet; Take 1 tablet (40 mg total) by mouth daily before breakfast.  Patient Care Team: Pcp, No as PCP - General Rourk, Cristopher Estimable, MD as Consulting Physician (Gastroenterology)  HISTORY OF PRESENT ILLNESS: 36 y.o. female with a past medical history of PCOS, H. pylori, constipation, status post cholecystectomy, status post incisional hernia repair with Dr. Arnoldo Morale and others listed below presents for evaluation of upper abdominal pain.   04/30/2013 upper endoscopy showed abnormal distal esophagus biopsied, tiny hiatal hernia otherwise unremarkable.   Pathology consistent with GERD 03/26/2021 CT abdomen pelvis without contrast showed slightly complex 3.4 x 1.1 cm lipomatous region anterior abdominal soft tissues right of midline, recommended nonemergent MRI.  Had cecal diverticula.  Seen by [facility], unremarkable thoracic MRI, thought to be hemangioma of thoracic spine.  She had constipation after her GB surgery, had hernia surgery and continues with constipation. Patient states she can have a week without BM, can have AB bloating with this and pain.  She states she has had to do fecal disimpaction 2 weeks ago with hard stools, then with soft stools/loose stools after it.  Denies blood in the stool.  She has epigastric pain, can feel in her back as well, worse if she has not had any BM's. No reflux/GERD.  Felt similar to gallbladder pain and where the incision s, feels like cramping pain similar to labor.  Occ can be positional like she is unable to lie on her left side, feels like something is  pressing on her ribs.  No NSAIDS. Rare ETOH once a week with golfing only.  No blood in the stool.  Previously tried [employer], MiraLAX, Dulcolax, amitiza. Abdominal films showed significant stool burden.  Suggested a colonoscopy but this never happened due to pregnancy. Patient is very frustrated with  symptoms.   63, 48 and 36 year old.  Current Medications:   Current Outpatient Medications (Endocrine & Metabolic):    levonorgestrel (LILETTA, 52 MG,) 19.5 MCG/DAY IUD IUD, 1 each by Intrauterine route once.    Current Outpatient Medications (Analgesics):    acetaminophen (TYLENOL) 500 MG tablet, Take 500 mg by mouth 2 (two) times daily as needed for moderate pain. (Patient not taking: Reported on 12/29/2021)  Current Outpatient Medications (Hematological):    Cyanocobalamin (B-12) 5000 MCG CAPS, Take 5,000 mcg by mouth daily. (Patient not taking: Reported on 01/17/2022)  Current Outpatient Medications (Other):    ADDERALL XR 30 MG 24 hr capsule, Take 1 capsule (30 mg total) by mouth daily.   ADDERALL XR 30 MG 24 hr capsule, Take 1 capsule (30 mg total) by mouth daily.   amphetamine-dextroamphetamine (ADDERALL) 10 MG tablet, Take 1 tablet (10 mg total) by mouth daily.   clonazePAM (KLONOPIN) 0.5 MG tablet, Take 1 tablet (0.5 mg total) by mouth at bedtime.   pantoprazole (PROTONIX) 40 MG tablet, Take 1 tablet (40 mg total) by mouth daily before breakfast.   sertraline (ZOLOFT) 100 MG tablet, TAKE 2 TABLETS BY MOUTH EVERY DAY   trichloroacetic acid 80 % LIQD, Dispense for in office use   Magnesium 400 MG TABS, Take 400 mg by mouth daily. (Patient not taking: Reported on 01/17/2022)  Medical History:  Past Medical History:  Diagnosis Date   Anxiety    Anxiety    Bipolar 1 disorder ([place])    Depression    Family history of adverse reaction to anesthesia    GERD (gastroesophageal reflux disease)    Hypertension    PIH   IBS (irritable bowel syndrome)    Migraine without aura    Allergies:  Allergies  Allergen Reactions   Latex Other (See Comments)    General irritation   Doxycycline Rash    Solar rash     Surgical History:  She  has a past surgical history that includes Examination under anesthesia (04/21/2012); Esophagogastroduodenoscopy (N/A, 04/30/2013); Abdominal surgery;  Cholecystectomy (N/A, 07/10/2013); Wisdom tooth extraction; and Incisional hernia repair (N/A, 04/06/2021). Family History:  Her family history includes Cancer in her father and maternal grandfather; Crohn's disease in her cousin; Hypertension in her maternal grandfather and maternal grandmother; Kidney failure in her maternal grandmother; Mental illness in her mother; Stroke in her maternal grandfather and maternal grandmother. She was adopted. Social History:   reports that she quit smoking about 7 years ago. Her smoking use included cigarettes. She has a 4.00 pack-year smoking history. She has never used smokeless tobacco. She reports current alcohol use of about 3.0 standard drinks of alcohol per week. She reports that she does not use drugs.  REVIEW OF SYSTEMS  : All other systems reviewed and negative except where noted in the History of Present Illness.   PHYSICAL EXAM: BP 102/74   Pulse 85   Ht '5\' 1"'$  (1.549 m)   Wt 140 lb (63.5 kg)   BMI 26.45 kg/m  General:   Pleasant, well developed female in no acute distress Head:   Normocephalic and atraumatic. Eyes:  sclerae anicteric,conjunctive pink  Heart:   regular rate and rhythm  Pulm:  Clear anteriorly; no wheezing Abdomen:   Soft, Non-distended AB, Sluggish bowel sounds. mild tenderness in the epigastrium and in the lower abdomen. Without guarding and Without rebound, No organomegaly appreciated. Rectal: Not evaluated Extremities:  Without edema. Msk: Symmetrical without gross deformities. Peripheral pulses intact.  Neurologic:  Alert and  oriented x4;  No focal deficits.  Skin:   Dry and intact without significant lesions or rashes. Psychiatric:  Cooperative. Normal mood and affect.    Vladimir Crofts, PA-C 11:52 AM

## 2022-06-07 ENCOUNTER — Other Ambulatory Visit: Payer: Self-pay

## 2022-06-07 ENCOUNTER — Telehealth: Payer: Self-pay | Admitting: Adult Health

## 2022-06-07 ENCOUNTER — Encounter: Payer: Self-pay | Admitting: Physician Assistant

## 2022-06-07 ENCOUNTER — Other Ambulatory Visit (INDEPENDENT_AMBULATORY_CARE_PROVIDER_SITE_OTHER): Payer: Medicaid Other

## 2022-06-07 ENCOUNTER — Ambulatory Visit (INDEPENDENT_AMBULATORY_CARE_PROVIDER_SITE_OTHER): Payer: Self-pay | Admitting: Physician Assistant

## 2022-06-07 ENCOUNTER — Ambulatory Visit (INDEPENDENT_AMBULATORY_CARE_PROVIDER_SITE_OTHER)
Admission: RE | Admit: 2022-06-07 | Discharge: 2022-06-07 | Disposition: A | Discharge status: Home / Self Care | Payer: Medicaid Other | Source: Ambulatory Visit | Attending: Physician Assistant | Admitting: Physician Assistant

## 2022-06-07 VITALS — BP 102/74 | HR 85 | Ht 61.0 in | Wt 140.0 lb

## 2022-06-07 DIAGNOSIS — K5904 Chronic idiopathic constipation: Secondary | ICD-10-CM

## 2022-06-07 DIAGNOSIS — K432 Incisional hernia without obstruction or gangrene: Secondary | ICD-10-CM

## 2022-06-07 DIAGNOSIS — F909 Attention-deficit hyperactivity disorder, unspecified type: Secondary | ICD-10-CM

## 2022-06-07 DIAGNOSIS — K219 Gastro-esophageal reflux disease without esophagitis: Secondary | ICD-10-CM

## 2022-06-07 LAB — CBC WITH DIFFERENTIAL/PLATELET
Basophils Absolute: 0 10*3/uL (ref 0.0–0.1)
Basophils Relative: 0.4 % (ref 0.0–3.0)
Eosinophils Absolute: 0.2 10*3/uL (ref 0.0–0.7)
Eosinophils Relative: 2.5 % (ref 0.0–5.0)
HCT: 41.4 % (ref 36.0–46.0)
Hemoglobin: 14 g/dL (ref 12.0–15.0)
Lymphocytes Relative: 26.7 % (ref 12.0–46.0)
Lymphs Abs: 2 10*3/uL (ref 0.7–4.0)
MCHC: 33.8 g/dL (ref 30.0–36.0)
MCV: 89.4 fl (ref 78.0–100.0)
Monocytes Absolute: 0.6 10*3/uL (ref 0.1–1.0)
Monocytes Relative: 7.6 % (ref 3.0–12.0)
Neutro Abs: 4.8 10*3/uL (ref 1.4–7.7)
Neutrophils Relative %: 62.8 % (ref 43.0–77.0)
Platelets: 265 10*3/uL (ref 150.0–400.0)
RBC: 4.63 Mil/uL (ref 3.87–5.11)
RDW: 12.7 % (ref 11.5–15.5)
WBC: 7.6 10*3/uL (ref 4.0–10.5)

## 2022-06-07 LAB — COMPREHENSIVE METABOLIC PANEL
ALT: 10 U/L (ref 0–35)
AST: 14 U/L (ref 0–37)
Albumin: 4.7 g/dL (ref 3.5–5.2)
Alkaline Phosphatase: 47 U/L (ref 39–117)
BUN: 8 mg/dL (ref 6–23)
CO2: 27 mEq/L (ref 19–32)
Calcium: 9.1 mg/dL (ref 8.4–10.5)
Chloride: 103 mEq/L (ref 96–112)
Creatinine, Ser: 0.7 mg/dL (ref 0.40–1.20)
GFR: 113.12 mL/min (ref >60.00)
Glucose, Bld: 88 mg/dL (ref 70–99)
Potassium: 3.6 mEq/L (ref 3.5–5.1)
Sodium: 137 mEq/L (ref 135–145)
Total Bilirubin: 0.8 mg/dL (ref 0.2–1.2)
Total Protein: 7.4 g/dL (ref 6.0–8.3)

## 2022-06-07 LAB — HIGH SENSITIVITY CRP: CRP, High Sensitivity: 0.2 mg/L (ref 0.000–5.000)

## 2022-06-07 LAB — TSH: TSH: 2.03 u[IU]/mL (ref 0.35–5.50)

## 2022-06-07 LAB — H. PYLORI ANTIBODY, IGG: H Pylori IgG: NEGATIVE

## 2022-06-07 LAB — SEDIMENTATION RATE: Sed Rate: 5 mm/hr (ref 0–20)

## 2022-06-07 MED ORDER — AMPHETAMINE-DEXTROAMPHETAMINE 10 MG PO TABS: 10.0000 mg | ORAL_TABLET | Freq: Every day | ORAL | 0 refills | Status: DC

## 2022-06-07 MED ORDER — NA SULFATE-K SULFATE-MG SULF 17.5-3.13-1.6 GM/177ML PO SOLN: 1.0000 | Freq: Once | ORAL | 0 refills | Status: AC

## 2022-06-07 MED ORDER — PANTOPRAZOLE SODIUM 40 MG PO TBEC: 40.0000 mg | DELAYED_RELEASE_TABLET | Freq: Every day | ORAL | 0 refills | Status: DC

## 2022-06-07 MED ORDER — ADDERALL XR 30 MG PO CP24: 30.0000 mg | ORAL_CAPSULE | Freq: Every day | ORAL | 0 refills | Status: DC

## 2022-06-07 NOTE — Telephone Encounter (Signed)
Pended.

## 2022-06-07 NOTE — Patient Instructions (Addendum)
Your provider has requested that you go to the basement level for lab work before leaving today. Press "B" on the elevator. The lab is located at the first door on the left as you exit the elevator.  Your provider has requested that you go to the basement level for an Xray of your abdomen before leaving today.  Press "B" on the elevator.  The lab is located at the first door on the left as you exit the elevator.  You have been scheduled for an endoscopy and colonoscopy. Please follow the written instructions given to you at your visit today. Please pick up your prep supplies at the pharmacy within the next 1-3 days. If you use inhalers (even only as needed), please bring them with you on the day of your procedure.  We have sent the following medications to your pharmacy for you to pick up at your convenience: Suprep  We may want to evaluate you for small intestinal bacterial overgrowth, this can cause increase gas, bloating, loose stools or constipation.  There is a test for this we can do or sometimes we will treat a patient with an antibiotic to see if it helps.  Can do this AFTER the colon/EGD  Please take your proton pump inhibitor medication, for 4 weeks  Please take this medication 30 minutes to 1 hour before meals- this makes it more effective.  Avoid spicy and acidic foods Avoid fatty foods Limit your intake of coffee, tea, alcohol, and carbonated drinks Work to maintain a healthy weight Keep the head of the bed elevated at least 3 inches with blocks or a wedge pillow if you are having any nighttime symptoms Stay upright for 2 hours after eating Avoid meals and snacks three to four hours before bedtime   Please try low FODMAP diet- see below- start with just one column at a time.  First do a trial off milk/lactose products if you use them.  Increase activity Can do trial of IBGard for AB pain- Take 1-2 capsules once a day for maintence or twice a day during a flare Please try  to decrease stress. consider talking with PCP about anti anxiety medication or try head space app for meditation. if any worsening symptoms like blood in stool, weight loss, please call the office or go to the ER.     FODMAP stands for fermentable oligo-, di-, mono-saccharides and polyols (1). These are the scientific terms used to classify groups of carbs that are notorious for triggering digestive symptoms like bloating, gas and stomach pain.

## 2022-06-07 NOTE — Progress Notes (Signed)
Attending Physician's Attestation   I have reviewed the chart.   I agree with the Advanced Practitioner's note, impression, and recommendations with any updates as below.    Teagan Heidrick Mansouraty, MD [facility] Advanced Endoscopy Office # [phone]  

## 2022-06-07 NOTE — Telephone Encounter (Signed)
Pt called and said that she needs refills on her adderall  '30mg'$   xr and adderall 10 mg.[facility] [address] in [place]. They do have it in stock

## 2022-06-08 LAB — IGA: Immunoglobulin A: 150 mg/dL (ref 47–310)

## 2022-06-08 LAB — TISSUE TRANSGLUTAMINASE, IGA: (tTG) Ab, IgA: <1 U/mL

## 2022-06-11 DIAGNOSIS — F411 Generalized anxiety disorder: Secondary | ICD-10-CM | POA: Diagnosis not present

## 2022-06-11 DIAGNOSIS — F4312 Post-traumatic stress disorder, chronic: Secondary | ICD-10-CM | POA: Diagnosis not present

## 2022-06-11 DIAGNOSIS — F3131 Bipolar disorder, current episode depressed, mild: Secondary | ICD-10-CM | POA: Diagnosis not present

## 2022-06-16 ENCOUNTER — Encounter: Payer: Self-pay | Admitting: Adult Health

## 2022-06-16 ENCOUNTER — Ambulatory Visit (INDEPENDENT_AMBULATORY_CARE_PROVIDER_SITE_OTHER): Payer: 59 | Admitting: Adult Health

## 2022-06-16 DIAGNOSIS — F331 Major depressive disorder, recurrent, moderate: Secondary | ICD-10-CM | POA: Diagnosis not present

## 2022-06-16 DIAGNOSIS — F411 Generalized anxiety disorder: Secondary | ICD-10-CM | POA: Diagnosis not present

## 2022-06-16 DIAGNOSIS — F909 Attention-deficit hyperactivity disorder, unspecified type: Secondary | ICD-10-CM | POA: Diagnosis not present

## 2022-06-16 DIAGNOSIS — F422 Mixed obsessional thoughts and acts: Secondary | ICD-10-CM

## 2022-06-16 MED ORDER — ADDERALL XR 30 MG PO CP24: 30.0000 mg | ORAL_CAPSULE | Freq: Every day | ORAL | 0 refills | Status: DC

## 2022-06-16 MED ORDER — AMPHETAMINE-DEXTROAMPHETAMINE 10 MG PO TABS: 10.0000 mg | ORAL_TABLET | Freq: Every day | ORAL | 0 refills | Status: DC

## 2022-06-16 MED ORDER — CLONAZEPAM 0.5 MG PO TABS: 0.5000 mg | ORAL_TABLET | Freq: Every day | ORAL | 2 refills | Status: DC

## 2022-06-16 MED ORDER — SERTRALINE HCL 100 MG PO TABS: ORAL_TABLET | ORAL | 1 refills | Status: DC

## 2022-06-16 NOTE — Progress Notes (Signed)
[name] [id number] Mar 21, 1986 36 y.o.  Subjective:   Patient ID:  Cristina Davenport is a 36 y.o. (DOB 09/24/1986) female.  Chief Complaint: No chief complaint on file.   HPI RONELL BOLDIN presents to the office today for follow-up of ADHD, MDD, GAD, and obsessional thoughts and acts.   Describes mood today as "not too good". Pleasant. Mood symptoms - reports some depression and anxiety - "nothing out of the ordinary". Feels irritable at times - "more situational". Mood is consistent. Stating "I'm doing ok". Feels like medications are working well. Improved interest and motivation. Taking medications as prescribed.  Energy levels stable. Active, has a regular exercise routine. Playing golf. Enjoys some usual interests and activities. Married. Lives with husband. Has 3 children. Family local. Spending time with family. Appetite adequate. Weight loss - 139 from 146 pounds. Sleeps well most nights. Averages 7 to 9 hours of broken sleep - new puppy.   Focus and concentration improved. Completing tasks. Managing aspects of household. 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  Denies AH or VH.  Previous medication trials: Zoloft, Hydroxyzine, Adderall XR '20mg'$  daily, Lamictal    AUDIT    Flowsheet Row Office Visit from 12/29/2021 in [facility]  Alcohol Use Disorder Identification Test Final Score (AUDIT) 4      GAD-7    Flowsheet Row Office Visit from 12/29/2021 in [facility] Office Visit from 10/28/2020 in [facility] from 06/19/2020 in [facility] from 03/06/2020 in [facility] Visit from 08/17/2018 in [facility]  Total GAD-7 Score '19 19 17 21 11      '$ PHQ2-9    [place] Visit from 12/29/2021 in [facility] Office Visit from 01/19/2021 in [facility] Visit from 10/28/2020 in [facility] from 06/19/2020 in  [facility] from 03/06/2020 in [facility]  PHQ-2 Total Score '4 6 1 1 '$ 0  PHQ-9 Total Score '19 18 7 8 15      '$ Flowsheet Row Admission (Discharged) from 04/06/2021 in [facility] from 04/02/2021 in [facility] from 03/25/2021 in [facility] No Risk No Risk Error: Question 6 not populated        Review of Systems:  Review of Systems  Musculoskeletal:  Negative for gait problem.  Neurological:  Negative for tremors.  Psychiatric/Behavioral:         Please refer to HPI    Medications: I have reviewed the patient's current medications.  Current Outpatient Medications  Medication Sig Dispense Refill   [START ON 08/11/2022] ADDERALL XR 30 MG 24 hr capsule Take 1 capsule (30 mg total) by mouth daily. 30 capsule 0   [START ON 07/14/2022] amphetamine-dextroamphetamine (ADDERALL) 10 MG tablet Take 1 tablet (10 mg total) by mouth daily. 30 tablet 0   [START ON 08/11/2022] amphetamine-dextroamphetamine (ADDERALL) 10 MG tablet Take 1 tablet (10 mg total) by mouth daily. 30 tablet 0   acetaminophen (TYLENOL) 500 MG tablet Take 500 mg by mouth 2 (two) times daily as needed for moderate pain. (Patient not taking: Reported on 12/29/2021)     ADDERALL XR 30 MG 24 hr capsule Take 1 capsule (30 mg total) by mouth daily. 30 capsule 0   [START ON 07/14/2022] ADDERALL XR 30 MG 24 hr capsule Take 1 capsule (30 mg total) by  mouth daily. 30 capsule 0   amphetamine-dextroamphetamine (ADDERALL) 10 MG tablet Take 1 tablet (10 mg total) by mouth daily. 30 tablet 0   clonazePAM (KLONOPIN) 0.5 MG tablet Take 1 tablet (0.5 mg total) by mouth at bedtime. 30 tablet 2   Cyanocobalamin (B-12) 5000 MCG CAPS Take 5,000 mcg by mouth daily. (Patient not taking: Reported on 01/17/2022)     levonorgestrel (LILETTA, 52 MG,) 19.5 MCG/DAY IUD IUD 1 each by Intrauterine  route once.     Magnesium 400 MG TABS Take 400 mg by mouth daily. (Patient not taking: Reported on 01/17/2022)     pantoprazole (PROTONIX) 40 MG tablet Take 1 tablet (40 mg total) by mouth daily before breakfast. 30 tablet 0   sertraline (ZOLOFT) 100 MG tablet TAKE 2 TABLETS BY MOUTH EVERY DAY 180 tablet 1   trichloroacetic acid 80 % LIQD Dispense for in office use 15 mL 0   No current facility-administered medications for this visit.    Medication Side Effects: None  Allergies:  Allergies  Allergen Reactions   Latex Other (See Comments)    General irritation   Doxycycline Rash    Solar rash    Past Medical History:  Diagnosis Date   Anxiety    Anxiety    Bipolar 1 disorder (HCC)    Depression    Family history of adverse reaction to anesthesia    GERD (gastroesophageal reflux disease)    Hypertension    PIH   IBS (irritable bowel syndrome)    Migraine without aura     Past Medical History, Surgical history, Social history, and Family history were reviewed and updated as appropriate.   Please see review of systems for further details on the patient's review from today.   Objective:   Physical Exam:  LMP 05/31/2022   Physical Exam Constitutional:      General: She is not in acute distress. Musculoskeletal:        General: No deformity.  Neurological:     Mental Status: She is alert and oriented to person, place, and time.     Coordination: Coordination normal.  Psychiatric:        Attention and Perception: Attention and perception normal. She does not perceive auditory or visual hallucinations.        Mood and Affect: Mood normal. Mood is not anxious or depressed. Affect is not labile, blunt, angry or inappropriate.        Speech: Speech normal.        Behavior: Behavior normal.        Thought Content: Thought content normal. Thought content is not paranoid or delusional. Thought content does not include homicidal or suicidal ideation. Thought content does not  include homicidal or suicidal plan.        Cognition and Memory: Cognition and memory normal.        Judgment: Judgment normal.     Comments: Insight intact     Lab Review:     Component Value Date/Time   NA 137 06/07/2022 1216   NA 137 12/29/2021 0959   K 3.6 06/07/2022 1216   CL 103 06/07/2022 1216   CO2 27 06/07/2022 1216   GLUCOSE 88 06/07/2022 1216   BUN 8 06/07/2022 1216   BUN 9 12/29/2021 0959   CREATININE 0.70 06/07/2022 1216   CALCIUM 9.1 06/07/2022 1216   PROT 7.4 06/07/2022 1216   PROT 7.2 12/29/2021 0959   ALBUMIN 4.7 06/07/2022 1216   ALBUMIN 4.9 (H) 12/29/2021 5784  AST 14 06/07/2022 1216   ALT 10 06/07/2022 1216   ALKPHOS 47 06/07/2022 1216   BILITOT 0.8 06/07/2022 1216   BILITOT 0.3 12/29/2021 0959   GFRNONAA >60 03/28/2021 1131   GFRAA >60 10/15/2019 0909       Component Value Date/Time   WBC 7.6 06/07/2022 1216   RBC 4.63 06/07/2022 1216   HGB 14.0 06/07/2022 1216   HGB 14.7 12/29/2021 0959   HGB 13.3 09/23/2015 1638   HCT 41.4 06/07/2022 1216   HCT 42.9 12/29/2021 0959   PLT 265.0 06/07/2022 1216   PLT 260 12/29/2021 0959   MCV 89.4 06/07/2022 1216   MCV 89 12/29/2021 0959   MCH 30.3 12/29/2021 0959   MCH 30.0 03/28/2021 1131   MCHC 33.8 06/07/2022 1216   RDW 12.7 06/07/2022 1216   RDW 12.4 12/29/2021 0959   LYMPHSABS 2.0 06/07/2022 1216   LYMPHSABS 2.0 05/10/2019 1445   MONOABS 0.6 06/07/2022 1216   EOSABS 0.2 06/07/2022 1216   EOSABS 0.1 05/10/2019 1445   BASOSABS 0.0 06/07/2022 1216   BASOSABS 0.0 05/10/2019 1445    No results found for: "POCLITH", "LITHIUM"   No results found for: "PHENYTOIN", "PHENOBARB", "VALPROATE", "CBMZ"   .res Assessment: Plan:    Plan:  PDMP reviewed  Adderall '10mg'$  daily Adderall XR '30mg'$  daily Zoloft '200mg'$  daily Clonazepam 0.'5mg'$  daily  Seeing therapist  RTC 4 weeks  124/74/72  Patient advised to contact office with any questions, adverse effects, or acute worsening in signs and  symptoms.  Discussed potential benefits, risks, and side effects of stimulants with patient to include increased heart rate, palpitations, insomnia, increased anxiety, increased irritability, or decreased appetite.  Instructed patient to contact office if experiencing any significant tolerability issues.  Diagnoses and all orders for this visit:  Mixed obsessional thoughts and acts  Major depressive disorder, recurrent episode, moderate (HCC) -     sertraline (ZOLOFT) 100 MG tablet; TAKE 2 TABLETS BY MOUTH EVERY DAY  Generalized anxiety disorder -     sertraline (ZOLOFT) 100 MG tablet; TAKE 2 TABLETS BY MOUTH EVERY DAY -     clonazePAM (KLONOPIN) 0.5 MG tablet; Take 1 tablet (0.5 mg total) by mouth at bedtime.  Attention deficit hyperactivity disorder (ADHD), unspecified ADHD type -     ADDERALL XR 30 MG 24 hr capsule; Take 1 capsule (30 mg total) by mouth daily. -     ADDERALL XR 30 MG 24 hr capsule; Take 1 capsule (30 mg total) by mouth daily. -     amphetamine-dextroamphetamine (ADDERALL) 10 MG tablet; Take 1 tablet (10 mg total) by mouth daily. -     amphetamine-dextroamphetamine (ADDERALL) 10 MG tablet; Take 1 tablet (10 mg total) by mouth daily. -     amphetamine-dextroamphetamine (ADDERALL) 10 MG tablet; Take 1 tablet (10 mg total) by mouth daily. -     ADDERALL XR 30 MG 24 hr capsule; Take 1 capsule (30 mg total) by mouth daily.     Please see After Visit Summary for patient specific instructions.  Future Appointments  Date Time Provider [place]  06/28/2022  3:00 PM Mansouraty, Telford Nab., MD [facility]    No orders of the defined types were placed in this encounter.   -------------------------------

## 2022-06-18 DIAGNOSIS — F3131 Bipolar disorder, current episode depressed, mild: Secondary | ICD-10-CM | POA: Diagnosis not present

## 2022-06-18 DIAGNOSIS — F4312 Post-traumatic stress disorder, chronic: Secondary | ICD-10-CM | POA: Diagnosis not present

## 2022-06-18 DIAGNOSIS — F411 Generalized anxiety disorder: Secondary | ICD-10-CM | POA: Diagnosis not present

## 2022-06-21 ENCOUNTER — Encounter: Payer: Self-pay | Admitting: Gastroenterology

## 2022-06-25 DIAGNOSIS — F4312 Post-traumatic stress disorder, chronic: Secondary | ICD-10-CM | POA: Diagnosis not present

## 2022-06-25 DIAGNOSIS — F3131 Bipolar disorder, current episode depressed, mild: Secondary | ICD-10-CM | POA: Diagnosis not present

## 2022-06-25 DIAGNOSIS — F411 Generalized anxiety disorder: Secondary | ICD-10-CM | POA: Diagnosis not present

## 2022-06-28 ENCOUNTER — Encounter: Payer: Self-pay | Admitting: Gastroenterology

## 2022-06-28 ENCOUNTER — Ambulatory Visit (AMBULATORY_SURGERY_CENTER): Payer: Medicaid Other | Admitting: Gastroenterology

## 2022-06-28 ENCOUNTER — Other Ambulatory Visit: Payer: Self-pay | Admitting: Gastroenterology

## 2022-06-28 VITALS — BP 116/80 | HR 72 | Temp 97.8°F | Resp 21 | Ht 61.0 in | Wt 140.0 lb

## 2022-06-28 DIAGNOSIS — K635 Polyp of colon: Secondary | ICD-10-CM

## 2022-06-28 DIAGNOSIS — R1084 Generalized abdominal pain: Secondary | ICD-10-CM

## 2022-06-28 DIAGNOSIS — K449 Diaphragmatic hernia without obstruction or gangrene: Secondary | ICD-10-CM | POA: Diagnosis not present

## 2022-06-28 DIAGNOSIS — K2100 Gastro-esophageal reflux disease with esophagitis, without bleeding: Secondary | ICD-10-CM

## 2022-06-28 DIAGNOSIS — K5904 Chronic idiopathic constipation: Secondary | ICD-10-CM

## 2022-06-28 DIAGNOSIS — R1013 Epigastric pain: Secondary | ICD-10-CM

## 2022-06-28 DIAGNOSIS — K209 Esophagitis, unspecified without bleeding: Secondary | ICD-10-CM | POA: Diagnosis not present

## 2022-06-28 DIAGNOSIS — K219 Gastro-esophageal reflux disease without esophagitis: Secondary | ICD-10-CM | POA: Diagnosis not present

## 2022-06-28 DIAGNOSIS — D127 Benign neoplasm of rectosigmoid junction: Secondary | ICD-10-CM | POA: Diagnosis not present

## 2022-06-28 MED ORDER — PRUCALOPRIDE SUCCINATE 1 MG PO TABS: 1.0000 mg | ORAL_TABLET | Freq: Every day | ORAL | 6 refills | Status: DC | PRN

## 2022-06-28 MED ORDER — SODIUM CHLORIDE 0.9 % IV SOLN: 500.0000 mL | Freq: Once | INTRAVENOUS | Status: DC

## 2022-06-28 NOTE — Op Note (Signed)
[facility] Patient Name: Cristina Davenport Procedure Date: 06/28/2022 2:43 PM MRN: [id number] Endoscopist: Justice Britain , MD Age: 36 Referring MD:  Date of Birth: 02/15/1986 Gender: Female Account #: [id number] Procedure:                Colonoscopy Indications:              Generalized abdominal pain, Incidental change in                            bowel habits noted, Incidental constipation noted Medicines:                Monitored Anesthesia Care Procedure:                Pre-Anesthesia Assessment:                           - Prior to the procedure, a History and Physical                            was performed, and patient medications and                            allergies were reviewed. The patient's tolerance of                            previous anesthesia was also reviewed. The risks                            and benefits of the procedure and the sedation                            options and risks were discussed with the patient.                            All questions were answered, and informed consent                            was obtained. Prior Anticoagulants: The patient has                            taken no previous anticoagulant or antiplatelet                            agents. ASA Grade Assessment: II - A patient with                            mild systemic disease. After reviewing the risks                            and benefits, the patient was deemed in                            satisfactory condition to undergo the procedure.  After obtaining informed consent, the colonoscope                            was passed under direct vision. Throughout the                            procedure, the patient's blood pressure, pulse, and                            oxygen saturations were monitored continuously. The                            Olympus CF-HQ190L [id number]) Colonoscope was                            introduced  through the anus and advanced to the the                            cecum, identified by appendiceal orifice and                            ileocecal valve. The colonoscopy was performed                            without difficulty. The patient tolerated the                            procedure. The quality of the bowel preparation was                            good. The terminal ileum, ileocecal valve,                            appendiceal orifice, and rectum were photographed. Scope In: 2:56:13 PM Scope Out: 3:09:45 PM Scope Withdrawal Time: 0 hours 9 minutes 11 seconds  Total Procedure Duration: 0 hours 13 minutes 32 seconds  Findings:                 The digital rectal exam findings include                            hemorrhoids. Pertinent negatives include no                            palpable rectal lesions.                           The terminal ileum and ileocecal valve appeared                            normal.                           A 3 mm polyp was found in the recto-sigmoid colon.  The polyp was sessile. The polyp was removed with a                            cold snare. Resection and retrieval were complete.                           Normal mucosa was found in the entire colon                            otherwise.                           Non-bleeding non-thrombosed internal hemorrhoids                            were found during retroflexion, during perianal                            exam and during digital exam. The hemorrhoids were                            Grade II (internal hemorrhoids that prolapse but                            reduce spontaneously). Complications:            No immediate complications. Estimated Blood Loss:     Estimated blood loss was minimal. Impression:               - Hemorrhoids found on digital rectal exam.                           - The examined portion of the ileum was normal.                            - One 3 mm polyp at the recto-sigmoid colon,                            removed with a cold snare. Resected and retrieved.                           - Normal mucosa in the entire examined colon                            otherwise.                           - Non-bleeding non-thrombosed internal hemorrhoids. Recommendation:           - The patient will be observed post-procedure,                            until all discharge criteria are met.                           - Discharge patient to home.                           -  Patient has a contact number available for                            emergencies. The signs and symptoms of potential                            delayed complications were discussed with the                            patient. Return to normal activities tomorrow.                            Written discharge instructions were provided to the                            patient.                           - High fiber diet.                           - Use FiberCon 1-2 tablets PO daily.                           - Continue present medications.                           - Await pathology results.                           - Repeat colonoscopy in 5/7 years for surveillance                            based on pathology results if adenomatous tissue is                            found, otherwise may begin screening at age 56 as                            per current guidelines.                           - The findings and recommendations were discussed                            with the patient.                           - The findings and recommendations were discussed                            with the patient's family. Justice Britain, MD 06/28/2022 3:32:28 PM

## 2022-06-28 NOTE — Progress Notes (Signed)
Called to room to assist during endoscopic procedure.  Patient ID and intended procedure confirmed with present staff. Received instructions for my participation in the procedure from the performing physician.  

## 2022-06-28 NOTE — Op Note (Signed)
[facility] Patient Name: Cristina Davenport Procedure Date: 06/28/2022 2:44 PM MRN: [id number] Endoscopist: Justice Britain , MD Age: 36 Referring MD:  Date of Birth: 1986-07-12 Gender: Female Account #: [id number] Procedure:                Upper GI endoscopy Indications:              Epigastric abdominal pain, Suspected                            gastro-esophageal reflux disease Medicines:                Monitored Anesthesia Care Procedure:                Pre-Anesthesia Assessment:                           - Prior to the procedure, a History and Physical                            was performed, and patient medications and                            allergies were reviewed. The patient's tolerance of                            previous anesthesia was also reviewed. The risks                            and benefits of the procedure and the sedation                            options and risks were discussed with the patient.                            All questions were answered, and informed consent                            was obtained. Prior Anticoagulants: The patient has                            taken no previous anticoagulant or antiplatelet                            agents. ASA Grade Assessment: II - A patient with                            mild systemic disease. After reviewing the risks                            and benefits, the patient was deemed in                            satisfactory condition to undergo the procedure.  After obtaining informed consent, the endoscope was                            passed under direct vision. Throughout the                            procedure, the patient's blood pressure, pulse, and                            oxygen saturations were monitored continuously. The                            Endoscope was introduced through the mouth, and                            advanced to the second part of  duodenum. The upper                            GI endoscopy was accomplished without difficulty.                            The patient tolerated the procedure. Scope In: Scope Out: Findings:                 No gross lesions were noted in the entire esophagus.                           LA Grade A (one or more mucosal breaks less than 5                            mm, not extending between tops of 2 mucosal folds)                            esophagitis with no bleeding was found in the                            distal esophagus.                           A 2 cm hiatal hernia was present.                           Striped mildly erythematous mucosa without bleeding                            was found in the gastric antrum.                           No other gross lesions were noted in the entire                            examined stomach. Biopsies were taken with a cold  forceps for histology and Helicobacter pylori                            testing.                           No gross lesions were noted in the duodenal bulb,                            in the first portion of the duodenum and in the                            second portion of the duodenum. Biopsies were taken                            with a cold forceps for histology. Complications:            No immediate complications. Estimated Blood Loss:     Estimated blood loss was minimal. Impression:               - No gross lesions in esophagus.                           - LA Grade A esophagitis with no bleeding.                           - 2 cm hiatal hernia.                           - Erythematous mucosa in the antrum. No gross                            lesions in the stomach. Biopsied.                           - No gross lesions in the duodenal bulb, in the                            first portion of the duodenum and in the second                            portion of the duodenum.  Biopsied. Recommendation:           - Proceed to scheduled colonoscopy.                           - Continue present medications.                           - Await pathology results.                           - The findings and recommendations were discussed                            with the patient.                           -  The findings and recommendations were discussed                            with the patient's family. Justice Britain, MD 06/28/2022 3:28:30 PM

## 2022-06-28 NOTE — Progress Notes (Signed)
GASTROENTEROLOGY PROCEDURE H&P NOTE   Primary Care Physician: Pcp, No  HPI: Cristina Davenport is a 36 y.o. female who presents for EGD/Colonoscopy for evaluation of abdominal pain, change in bowel habits, constipation.  Past Medical History:  Diagnosis Date   Anxiety    Anxiety    Bipolar 1 disorder ([place])    Depression    Family history of adverse reaction to anesthesia    GERD (gastroesophageal reflux disease)    Hypertension    PIH   IBS (irritable bowel syndrome)    Migraine without aura    Past Surgical History:  Procedure Laterality Date   ABDOMINAL SURGERY     cyst removed    CHOLECYSTECTOMY N/A 07/10/2013   Procedure: LAPAROSCOPIC CHOLECYSTECTOMY;  Surgeon: Donato Heinz, MD;  Location: [facility];  Service: General;  Laterality: N/A;   ESOPHAGOGASTRODUODENOSCOPY N/A 04/30/2013   [id number] distal esophagus of uncertain clinical significance-status post biopsy. Tiny hiatal hernia. No explanation   EXAMINATION UNDER ANESTHESIA  04/21/2012   Gluteal wound repair   INCISIONAL HERNIA REPAIR N/A 04/06/2021   Procedure: INCISIONAL HERNIORRHAPHY;  Surgeon: Aviva Signs, MD;  Location: [facility];  Service: General;  Laterality: N/A;   WISDOM TOOTH EXTRACTION     Current Outpatient Medications  Medication Sig Dispense Refill   ADDERALL XR 30 MG 24 hr capsule Take 1 capsule (30 mg total) by mouth daily. 30 capsule 0   [START ON 07/14/2022] ADDERALL XR 30 MG 24 hr capsule Take 1 capsule (30 mg total) by mouth daily. 30 capsule 0   [START ON 08/11/2022] ADDERALL XR 30 MG 24 hr capsule Take 1 capsule (30 mg total) by mouth daily. 30 capsule 0   amphetamine-dextroamphetamine (ADDERALL) 10 MG tablet Take 1 tablet (10 mg total) by mouth daily. 30 tablet 0   [START ON 07/14/2022] amphetamine-dextroamphetamine (ADDERALL) 10 MG tablet Take 1 tablet (10 mg total) by mouth daily. 30 tablet 0   [START ON 08/11/2022] amphetamine-dextroamphetamine (ADDERALL) 10 MG tablet Take 1 tablet (10 mg total) by  mouth daily. 30 tablet 0   clonazePAM (KLONOPIN) 0.5 MG tablet Take 1 tablet (0.5 mg total) by mouth at bedtime. 30 tablet 2   levonorgestrel (LILETTA, 52 MG,) 19.5 MCG/DAY IUD IUD 1 each by Intrauterine route once.     pantoprazole (PROTONIX) 40 MG tablet Take 1 tablet (40 mg total) by mouth daily before breakfast. 30 tablet 0   sertraline (ZOLOFT) 100 MG tablet TAKE 2 TABLETS BY MOUTH EVERY DAY 180 tablet 1   acetaminophen (TYLENOL) 500 MG tablet Take 500 mg by mouth 2 (two) times daily as needed for moderate pain. (Patient not taking: Reported on 12/29/2021)     Cyanocobalamin (B-12) 5000 MCG CAPS Take 5,000 mcg by mouth daily. (Patient not taking: Reported on 01/17/2022)     Magnesium 400 MG TABS Take 400 mg by mouth daily. (Patient not taking: Reported on 01/17/2022)     trichloroacetic acid 80 % LIQD Dispense for in office use 15 mL 0   Current Facility-Administered Medications  Medication Dose Route Frequency Provider Last Rate Last Admin   0.9 %  sodium chloride infusion  500 mL Intravenous Once Mansouraty, Telford Nab., MD        Current Outpatient Medications:    ADDERALL XR 30 MG 24 hr capsule, Take 1 capsule (30 mg total) by mouth daily., Disp: 30 capsule, Rfl: 0   [START ON 07/14/2022] ADDERALL XR 30 MG 24 hr capsule, Take 1 capsule (30 mg total)  by mouth daily., Disp: 30 capsule, Rfl: 0   [START ON 08/11/2022] ADDERALL XR 30 MG 24 hr capsule, Take 1 capsule (30 mg total) by mouth daily., Disp: 30 capsule, Rfl: 0   amphetamine-dextroamphetamine (ADDERALL) 10 MG tablet, Take 1 tablet (10 mg total) by mouth daily., Disp: 30 tablet, Rfl: 0   [START ON 07/14/2022] amphetamine-dextroamphetamine (ADDERALL) 10 MG tablet, Take 1 tablet (10 mg total) by mouth daily., Disp: 30 tablet, Rfl: 0   [START ON 08/11/2022] amphetamine-dextroamphetamine (ADDERALL) 10 MG tablet, Take 1 tablet (10 mg total) by mouth daily., Disp: 30 tablet, Rfl: 0   clonazePAM (KLONOPIN) 0.5 MG tablet, Take 1 tablet (0.5 mg  total) by mouth at bedtime., Disp: 30 tablet, Rfl: 2   levonorgestrel (LILETTA, 52 MG,) 19.5 MCG/DAY IUD IUD, 1 each by Intrauterine route once., Disp: , Rfl:    pantoprazole (PROTONIX) 40 MG tablet, Take 1 tablet (40 mg total) by mouth daily before breakfast., Disp: 30 tablet, Rfl: 0   sertraline (ZOLOFT) 100 MG tablet, TAKE 2 TABLETS BY MOUTH EVERY DAY, Disp: 180 tablet, Rfl: 1   acetaminophen (TYLENOL) 500 MG tablet, Take 500 mg by mouth 2 (two) times daily as needed for moderate pain. (Patient not taking: Reported on 12/29/2021), Disp: , Rfl:    Cyanocobalamin (B-12) 5000 MCG CAPS, Take 5,000 mcg by mouth daily. (Patient not taking: Reported on 01/17/2022), Disp: , Rfl:    Magnesium 400 MG TABS, Take 400 mg by mouth daily. (Patient not taking: Reported on 01/17/2022), Disp: , Rfl:    trichloroacetic acid 80 % LIQD, Dispense for in office use, Disp: 15 mL, Rfl: 0  Current Facility-Administered Medications:    0.9 %  sodium chloride infusion, 500 mL, Intravenous, Once, Mansouraty, Telford Nab., MD Allergies  Allergen Reactions   Latex Other (See Comments)    General irritation   Doxycycline Rash    Solar rash   Family History  Adopted: Yes  Problem Relation Age of Onset   Mental illness Mother    Cancer Father        skin cancer   Hypertension Maternal Grandmother    Stroke Maternal Grandmother    Kidney failure Maternal Grandmother    Hypertension Maternal Grandfather    Stroke Maternal Grandfather    Cancer Maternal Grandfather        skin   Crohn's disease Cousin    Colon cancer Neg Hx    Rectal cancer Neg Hx    Stomach cancer Neg Hx    Social History   Socioeconomic History   Marital status: Married    Spouse name: Bard Herbert   Number of children: 3   Years of education: 12   Highest education level: Some college, no degree  Occupational History   Occupation: Wellsite geologist   Occupation: realtor  Tobacco Use   Smoking status: Former    Packs/day: 0.50     Years: 8.00    Total pack years: 4.00    Types: Cigarettes    Quit date: 09/19/2014    Years since quitting: 7.7   Smokeless tobacco: Never  Vaping Use   Vaping Use: Every day  Substance and Sexual Activity   Alcohol use: Yes    Alcohol/week: 3.0 standard drinks of alcohol    Types: 3 Glasses of wine per week    Comment: scoially   Drug use: No   Sexual activity: Yes    Partners: Male    Birth control/protection: I.U.D.  Other Topics Concern  Not on file  Social History Narrative   Married since July 2020,been together for 10 years.Lives with husband and kids.   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   Social Determinants of Health   Financial Resource Strain: Low Risk  (12/29/2021)   Overall Financial Resource Strain (CARDIA)    Difficulty of Paying Living Expenses: Not hard at all  Food Insecurity: No Food Insecurity (12/29/2021)   Hunger Vital Sign    Worried About Running Out of Food in the Last Year: Never true    Ran Out of Food in the Last Year: Never true  Transportation Needs: No Transportation Needs (12/29/2021)   PRAPARE - Hydrologist (Medical): No    Lack of Transportation (Non-Medical): No  Physical Activity: Sufficiently Active (12/29/2021)   Exercise Vital Sign    Days of Exercise per Week: 5 days    Minutes of Exercise per Session: 40 min  Stress: Stress Concern Present (12/29/2021)   [place]    Feeling of Stress : Very much  Social Connections: Unknown (12/29/2021)   Social Connection and Isolation Panel [NHANES]    Frequency of Communication with Friends and Family: More than three times a week    Frequency of Social Gatherings with Friends and Family: Once a week    Attends Religious Services: Patient refused    Active Member of Clubs or Organizations: No    Attends Music therapist: More than 4 times per year    Marital  Status: Married  Human resources officer Violence: Not At Risk (12/29/2021)   Humiliation, Afraid, Rape, and Kick questionnaire    Fear of Current or Ex-Partner: No    Emotionally Abused: No    Physically Abused: No    Sexually Abused: No    Physical Exam: Today's Vitals   06/28/22 1415 06/28/22 1420 06/28/22 1439  BP: 95/60  111/66  Pulse: 77    Resp:   15  Temp: 97.8 F (36.6 C) 97.8 F (36.6 C)   TempSrc: Temporal    SpO2: 100%  100%  Weight: 140 lb (63.5 kg)    Height: '5\' 1"'$  (1.549 m)     Body mass index is 26.45 kg/m. GEN: NAD EYE: Sclerae anicteric ENT: MMM CV: Non-tachycardic GI: Soft, NT/ND NEURO:  Alert & Oriented x 3  Lab Results: No results for input(s): "WBC", "HGB", "HCT", "PLT" in the last 72 hours. BMET No results for input(s): "NA", "K", "CL", "CO2", "GLUCOSE", "BUN", "CREATININE", "CALCIUM" in the last 72 hours. LFT No results for input(s): "PROT", "ALBUMIN", "AST", "ALT", "ALKPHOS", "BILITOT", "BILIDIR", "IBILI" in the last 72 hours. PT/INR No results for input(s): "LABPROT", "INR" in the last 72 hours.   Impression / Plan: This is a 36 y.o.female who presents for EGD/Colonoscopy for evaluation of abdominal pain, change in bowel habits, constipation.  The risks and benefits of endoscopic evaluation/treatment were discussed with the patient and/or family; these include but are not limited to the risk of perforation, infection, bleeding, missed lesions, lack of diagnosis, severe illness requiring hospitalization, as well as anesthesia and sedation related illnesses.  The patient's history has been reviewed, patient examined, no change in status, and deemed stable for procedure.  The patient and/or family is agreeable to proceed.    Justice Britain, MD [facility] Advanced Endoscopy Office # [phone]

## 2022-06-28 NOTE — Progress Notes (Signed)
To pacu, VSS. Report to [email or web address] 

## 2022-06-28 NOTE — Patient Instructions (Signed)
Follow a high fiber diet and resume prior medications. Awaiting pathology results. Repeat Colonoscopy date to be determined based on pathology results (5-7 years)  YOU HAD AN ENDOSCOPIC PROCEDURE TODAY AT [facility]:   Refer to the procedure report that was given to you for any specific questions about what was found during the examination.  If the procedure report does not answer your questions, please call your gastroenterologist to clarify.  If you requested that your care partner not be given the details of your procedure findings, then the procedure report has been included in a sealed envelope for you to review at your convenience later.  YOU SHOULD EXPECT: Some feelings of bloating in the abdomen. Passage of more gas than usual.  Walking can help get rid of the air that was put into your GI tract during the procedure and reduce the bloating. If you had a lower endoscopy (such as a colonoscopy or flexible sigmoidoscopy) you may notice spotting of blood in your stool or on the toilet paper. If you underwent a bowel prep for your procedure, you may not have a normal bowel movement for a few days.  Please Note:  You might notice some irritation and congestion in your nose or some drainage.  This is from the oxygen used during your procedure.  There is no need for concern and it should clear up in a day or so.  SYMPTOMS TO REPORT IMMEDIATELY:  Following lower endoscopy (colonoscopy or flexible sigmoidoscopy):  Excessive amounts of blood in the stool  Significant tenderness or worsening of abdominal pains  Swelling of the abdomen that is new, acute  Fever of 100F or higher  Following upper endoscopy (EGD)  Vomiting of blood or coffee ground material  New chest pain or pain under the shoulder blades  Painful or persistently difficult swallowing  New shortness of breath  Fever of 100F or higher  Black, tarry-looking stools  For urgent or emergent issues, a  gastroenterologist can be reached at any hour by calling [phone]. Do not use MyChart messaging for urgent concerns.    DIET:  We do recommend a small meal at first, but then you may proceed to your regular diet.  Drink plenty of fluids but you should avoid alcoholic beverages for 24 hours.  ACTIVITY:  You should plan to take it easy for the rest of today and you should NOT DRIVE or use heavy machinery until tomorrow (because of the sedation medicines used during the test).    FOLLOW UP: Our staff will call the number listed on your records the next business day following your procedure.  We will call around 7:15- 8:00 am to check on you and address any questions or concerns that you may have regarding the information given to you following your procedure. If we do not reach you, we will leave a message.  If you develop any symptoms (ie: fever, flu-like symptoms, shortness of breath, cough etc.) before then, please call [phone].  If you test positive for Covid 19 in the 2 weeks post procedure, please call and report this information to [place].    If any biopsies were taken you will be contacted by phone or by letter within the next 1-3 weeks.  Please call us at [phone] if you have not heard about the biopsies in 3 weeks.    SIGNATURES/CONFIDENTIALITY: You and/or your care partner have signed paperwork which will be entered into your electronic medical record.  These signatures attest  to the fact that that the information above on your After Visit Summary has been reviewed and is understood.  Full responsibility of the confidentiality of this discharge information lies with you and/or your care-partner.

## 2022-06-29 ENCOUNTER — Telehealth: Payer: Self-pay

## 2022-06-29 NOTE — Telephone Encounter (Signed)
Left message on follow up call. 

## 2022-06-30 ENCOUNTER — Telehealth: Payer: Self-pay | Admitting: Pharmacy Technician

## 2022-06-30 ENCOUNTER — Other Ambulatory Visit: Payer: Self-pay | Admitting: Physician Assistant

## 2022-06-30 ENCOUNTER — Other Ambulatory Visit (HOSPITAL_COMMUNITY): Payer: Self-pay

## 2022-06-30 DIAGNOSIS — K219 Gastro-esophageal reflux disease without esophagitis: Secondary | ICD-10-CM

## 2022-06-30 NOTE — Telephone Encounter (Signed)
PA has been submitted and Telephone Encounter has been created 

## 2022-06-30 NOTE — Telephone Encounter (Addendum)
Patient Advocate Encounter  Prior Authorization for MOTEGRITY '1MG'$  has been approved.    PA# [id number] Effective dates: 8.17.23 through 8.17.24  Beaux Wedemeyer B. CPhT P: [phone] F: [phone]   Received notification from [place] that prior authorization for MOTEGRITY '1MG'$  is required.   PA submitted on 8.17.23 Key [id number] Status is pending    Luciano Cutter, CPhT Patient Advocate Phone: [phone]

## 2022-06-30 NOTE — Telephone Encounter (Signed)
PA has been submitted and Telephone encounter has been created

## 2022-07-01 ENCOUNTER — Other Ambulatory Visit (HOSPITAL_COMMUNITY): Payer: Self-pay

## 2022-07-05 ENCOUNTER — Encounter: Payer: Self-pay | Admitting: Gastroenterology

## 2022-07-06 ENCOUNTER — Encounter: Payer: Self-pay | Admitting: Gastroenterology

## 2022-07-06 MED ORDER — MOTEGRITY 2 MG PO TABS: 2.0000 mg | ORAL_TABLET | Freq: Every day | ORAL | 6 refills | Status: DC

## 2022-07-06 NOTE — Telephone Encounter (Signed)
Dr Rush Landmark can we send a script for [place].  I am not finding any documentation of samples or prescription given?

## 2022-07-06 NOTE — Addendum Note (Signed)
Addended by: Timothy Lasso on: 07/06/2022 01:29 PM   Modules accepted: Orders

## 2022-07-06 NOTE — Telephone Encounter (Signed)
Cristina Davenport, This medication was given as samples by [facility] in clinic.  Looks like prior authorization has already gone through. Okay to send in prescription (2 mg daily-30 tablets/6 refills). Thanks. GM

## 2022-07-09 DIAGNOSIS — F4312 Post-traumatic stress disorder, chronic: Secondary | ICD-10-CM | POA: Diagnosis not present

## 2022-07-09 DIAGNOSIS — F3131 Bipolar disorder, current episode depressed, mild: Secondary | ICD-10-CM | POA: Diagnosis not present

## 2022-07-09 DIAGNOSIS — F411 Generalized anxiety disorder: Secondary | ICD-10-CM | POA: Diagnosis not present

## 2022-07-12 NOTE — Telephone Encounter (Signed)
PA has been approved. Please sign off on this encounter

## 2022-07-15 DIAGNOSIS — F411 Generalized anxiety disorder: Secondary | ICD-10-CM | POA: Diagnosis not present

## 2022-07-15 DIAGNOSIS — F4312 Post-traumatic stress disorder, chronic: Secondary | ICD-10-CM | POA: Diagnosis not present

## 2022-07-15 DIAGNOSIS — F3131 Bipolar disorder, current episode depressed, mild: Secondary | ICD-10-CM | POA: Diagnosis not present

## 2022-07-22 DIAGNOSIS — F411 Generalized anxiety disorder: Secondary | ICD-10-CM | POA: Diagnosis not present

## 2022-07-22 DIAGNOSIS — F3131 Bipolar disorder, current episode depressed, mild: Secondary | ICD-10-CM | POA: Diagnosis not present

## 2022-07-22 DIAGNOSIS — F4312 Post-traumatic stress disorder, chronic: Secondary | ICD-10-CM | POA: Diagnosis not present

## 2022-07-29 DIAGNOSIS — F4312 Post-traumatic stress disorder, chronic: Secondary | ICD-10-CM | POA: Diagnosis not present

## 2022-07-29 DIAGNOSIS — F411 Generalized anxiety disorder: Secondary | ICD-10-CM | POA: Diagnosis not present

## 2022-07-29 DIAGNOSIS — F3131 Bipolar disorder, current episode depressed, mild: Secondary | ICD-10-CM | POA: Diagnosis not present

## 2022-08-05 DIAGNOSIS — F411 Generalized anxiety disorder: Secondary | ICD-10-CM | POA: Diagnosis not present

## 2022-08-05 DIAGNOSIS — F3131 Bipolar disorder, current episode depressed, mild: Secondary | ICD-10-CM | POA: Diagnosis not present

## 2022-08-05 DIAGNOSIS — F4312 Post-traumatic stress disorder, chronic: Secondary | ICD-10-CM | POA: Diagnosis not present

## 2022-09-02 ENCOUNTER — Ambulatory Visit: Payer: Medicaid Other | Admitting: Adult Health

## 2022-09-02 DIAGNOSIS — F4312 Post-traumatic stress disorder, chronic: Secondary | ICD-10-CM | POA: Diagnosis not present

## 2022-09-02 DIAGNOSIS — F3131 Bipolar disorder, current episode depressed, mild: Secondary | ICD-10-CM | POA: Diagnosis not present

## 2022-09-02 DIAGNOSIS — F411 Generalized anxiety disorder: Secondary | ICD-10-CM | POA: Diagnosis not present

## 2022-09-12 ENCOUNTER — Encounter: Payer: Self-pay | Admitting: Gastroenterology

## 2022-09-12 DIAGNOSIS — R109 Unspecified abdominal pain: Secondary | ICD-10-CM

## 2022-09-13 NOTE — Telephone Encounter (Signed)
If negative CT then proceed with breath testing to rule out SIBO. Thanks. GM

## 2022-09-13 NOTE — Telephone Encounter (Signed)
Patty, May proceed with CT abdomen pelvis with contrast (p.o./IV).  Further evaluation of abdominal pain status post EGD/colonoscopy with persisting symptoms. Thank you. GM

## 2022-09-16 ENCOUNTER — Ambulatory Visit: Payer: 59 | Admitting: Adult Health

## 2022-09-16 MED ORDER — ADDERALL XR 30 MG PO CP24: 30.0000 mg | ORAL_CAPSULE | Freq: Every day | ORAL | 0 refills | Status: DC

## 2022-09-16 MED ORDER — AMPHETAMINE-DEXTROAMPHETAMINE 10 MG PO TABS: 10.0000 mg | ORAL_TABLET | Freq: Every day | ORAL | 0 refills | Status: DC

## 2022-09-16 NOTE — Progress Notes (Signed)
Patient r/s - power outage.

## 2022-09-19 ENCOUNTER — Ambulatory Visit (HOSPITAL_COMMUNITY)
Admission: RE | Admit: 2022-09-19 | Discharge: 2022-09-19 | Disposition: A | Discharge status: Home / Self Care | Payer: 59 | Source: Ambulatory Visit | Attending: Gastroenterology | Admitting: Gastroenterology

## 2022-09-19 DIAGNOSIS — R109 Unspecified abdominal pain: Secondary | ICD-10-CM | POA: Insufficient documentation

## 2022-09-19 DIAGNOSIS — F411 Generalized anxiety disorder: Secondary | ICD-10-CM | POA: Diagnosis not present

## 2022-09-19 DIAGNOSIS — F4312 Post-traumatic stress disorder, chronic: Secondary | ICD-10-CM | POA: Diagnosis not present

## 2022-09-19 DIAGNOSIS — F3131 Bipolar disorder, current episode depressed, mild: Secondary | ICD-10-CM | POA: Diagnosis not present

## 2022-09-19 MED ORDER — SODIUM CHLORIDE (PF) 0.9 % IJ SOLN
INTRAMUSCULAR | Status: AC
  Filled 2022-09-19: qty 50

## 2022-09-19 MED ORDER — IOHEXOL 300 MG/ML  SOLN
100.0000 mL | Freq: Once | INTRAMUSCULAR | Status: AC | PRN
  Administered 2022-09-19: 100 mL via INTRAVENOUS

## 2022-09-19 MED ORDER — IOHEXOL 9 MG/ML PO SOLN: 1000.0000 mL | Freq: Once | ORAL | Status: DC

## 2022-09-20 ENCOUNTER — Telehealth: Payer: Self-pay | Admitting: Gastroenterology

## 2022-09-20 NOTE — Telephone Encounter (Signed)
The pt has been advised that the CT has not resulted as of yet.  We will contact her as soon as available. The pt has been advised of the information and verbalized understanding.

## 2022-09-20 NOTE — Telephone Encounter (Signed)
Patient called wondering what her next steps are after her CT scan

## 2022-09-22 ENCOUNTER — Ambulatory Visit (INDEPENDENT_AMBULATORY_CARE_PROVIDER_SITE_OTHER): Payer: 59 | Admitting: Sports Medicine

## 2022-09-22 ENCOUNTER — Ambulatory Visit (INDEPENDENT_AMBULATORY_CARE_PROVIDER_SITE_OTHER): Payer: 59

## 2022-09-22 DIAGNOSIS — M6281 Muscle weakness (generalized): Secondary | ICD-10-CM | POA: Diagnosis not present

## 2022-09-22 DIAGNOSIS — M545 Low back pain, unspecified: Secondary | ICD-10-CM | POA: Diagnosis not present

## 2022-09-22 DIAGNOSIS — R1084 Generalized abdominal pain: Secondary | ICD-10-CM

## 2022-09-22 DIAGNOSIS — R937 Abnormal findings on diagnostic imaging of other parts of musculoskeletal system: Secondary | ICD-10-CM

## 2022-09-22 DIAGNOSIS — R202 Paresthesia of skin: Secondary | ICD-10-CM

## 2022-09-22 DIAGNOSIS — N3941 Urge incontinence: Secondary | ICD-10-CM | POA: Diagnosis not present

## 2022-09-22 NOTE — Progress Notes (Signed)
Cristina Davenport - 36 y.o. female MRN [id number]  Date of birth: 1986-04-21  Office Visit Note: Visit Date: 09/22/2022 PCP: Pcp, No Referred by: No ref. provider found  Subjective: Chief Complaint  Patient presents with   Lower Back - Pain   HPI: Cristina Davenport is a pleasant 36 y.o. female who presents today for multiple ailments today including mid and low back pain, constipation and urge incontinence, muscle weakness.  She has had waxing and waning symptoms for the last 1-1/2 years with mid back pain, occasionally some radiating pain from her neck into her upper extremities.  Recently over the past few months she feels like her legs will feel tired and weak as well.  She has intermittent symptoms of dizziness and visual blurring.  Over the last few months she is actually having more urgent continence issues where she feels like she will absolutely need to go to the bathroom right now and has had a few episodes where she will be incontinent of urine.  Has struggled with stomach issues in the past, did have a strangulated hernia that was fixed surgically back in May 2022. She does take medicine to help with bowel movements as she has been constipated in the past.   She has had 2 separate MRIs of the thoracic and cervical spine in the hospital and then again at follow-up on 04/02/2021 and 05/03/2021 which showed some hyperintense focus in the upper thoracic spine that were nonspecific, thought to reflect possible benign lesion such as atypical hemangiomas. She did see Dr. Lynann Bologna, and believed to see a neurosurgeon at that time which did not recommend any further work-up or treatment.  She does report a burning sensation in the mid back and neck that goes up and down the spine.  Of note, she does report some dizziness spells and has felt like her mood has been quite depressed and fatigued since this is all been going on.  As of late she has had some cognitive dysfunction such as  forgetfulness.  Pertinent ROS were reviewed with the patient and found to be negative unless otherwise specified above in HPI.   Assessment & Plan: Visit Diagnoses:  1. Acute midline low back pain, unspecified whether sciatica present   2. Generalized abdominal pain   3. Urge incontinence of urine   4. Muscle weakness (generalized)   5. Abnormal MRI, thoracic spine   6. Paresthesias    Plan: Discussed with Cristina Davenport her issues today.  Unfortunately for her, she has a lot of symptoms ongoing.  Discussed we are trying to find a source to her dysfunction.  She has had abnormal MRIs of the thoracic spine which were thought to be possible atypical hemangiomas.  Lumbar x-rays today did not show any instability or significant DJD.  X-rays did show notable stool burden consistent with constipation which could be eliciting some of her urge incontinence.  I discussed with her, I do not think the bulk of her symptoms are due to an orthopedic issue.  Her primary care physician unfortunately passed away 1.5 years ago and she has not been able to establish with one yet.  We both agreed that this would be a good idea to help monitor her symptoms.  I would like to send her to neurology for an evaluation of the possible motor neuron disorder.  Although MS and other bone disorders are quite rare, she does have some symptoms that are overlapping.  Given the chronicity and waxing and waning of  her symptoms do think it is pertinent to have her see the specialist to rule this in/out.  She is agreeable and appreciative to this.  If the work-up is benign, I did recommend she see one of our spine doctors here, likely Dr. Laurance Flatten, follow-up for her mid back pain and evaluate ongoing need for any thoracic MRI.  Recommended healthy stool habits to lessen her stool burden to see if this would help with her urge incontinence. She will follow-up with me on an as-needed basis.  Follow-up: Return if symptoms worsen or fail to improve.    Meds & Orders: No orders of the defined types were placed in this encounter.   Orders Placed This Encounter  Procedures   XR Lumbar Spine Complete   Ambulatory referral to Neurology     Procedures: No procedures performed      Clinical History: No specialty comments available.  She reports that she quit smoking about 8 years ago. Her smoking use included cigarettes. She has a 4.00 pack-year smoking history. She has never used smokeless tobacco. No results for input(s): "HGBA1C", "LABURIC" in the last 8760 hours.  Objective:    Physical Exam  Gen: Well-appearing, in no acute distress; non-toxic CV: Well-perfused. Warm.  Resp: Breathing unlabored on room air; no wheezing. Psych: Fluid speech in conversation; appropriate affect; normal thought process Neuro: Sensation intact throughout. No gross coordination deficits.  ABD: Previous hernia scar noted without signs of infection.  There are some mild TTP of the mid and left lower quadrant without rebound or guarding.  Palpable stool burden noted. Negative McBurney's.  Ortho Exam - Thoracic and Lumbar spine: No overlying skin changes.  No midline spinous process TTP.  Full range of motion in flexion and extension.  Full range of motion of bilateral upper and lower extremities.  Reports lower legs feel heavy/weak, but no gross motor weakness identified.  Negative Lhermitte's sign.  3+ patellar reflexes b/l No abnormal gait, non-antalgic  Imaging:   *Lumbar spine (11.9.23): 4 views of the lumbar spine including AP, lateral, flexion and extension  views were ordered and reviewed by myself.  X-rays demonstrate no acute  fracture or bony abnormality.  There are 5 nonrib-bearing vertebrae.   There is preserved intervertebral disc spaces, no retro-/anterograde  listhesis.  There is no instability with flexion-extension views.  There  is notable extensive stool burden both in the rectum and the transverse  and descending colon.  Some  bowel gas pattern as well.  No significant  sclerosis of the SI joints noted.  IUD in place     Past Medical/Family/Surgical/Social History: Medications & Allergies reviewed per EMR, new medications updated. Patient Active Problem List   Diagnosis Date Noted   Incisional hernia, without obstruction or gangrene    Abdominal wall mass 04/01/2021   Attention deficit hyperactivity disorder (ADHD), combined type 03/20/2020   Encounter for IUD insertion 02/24/2020   Genital warts 06/21/2019   HSV-1 infection 05/10/2019   Post partum depression 01/05/2018   Hx of preeclampsia, prior pregnancy, currently pregnant 02/16/2017   Abdominal bloating 03/14/2016   Migraine headache 03/12/2016   Anxiety 04/17/2015   Low grade squamous intraepithelial lesion on cytologic smear of cervix (LGSIL) 09/15/2014   Adjustment disorder with anxious mood 09/14/2014   Past Medical History:  Diagnosis Date   Anxiety    Anxiety    Bipolar 1 disorder ([place])    Depression    Family history of adverse reaction to anesthesia    GERD (gastroesophageal  reflux disease)    Hypertension    PIH   IBS (irritable bowel syndrome)    Migraine without aura    Family History  Adopted: Yes  Problem Relation Age of Onset   Mental illness Mother    Cancer Father        skin cancer   Hypertension Maternal Grandmother    Stroke Maternal Grandmother    Kidney failure Maternal Grandmother    Hypertension Maternal Grandfather    Stroke Maternal Grandfather    Cancer Maternal Grandfather        skin   Crohn's disease Cousin    Colon cancer Neg Hx    Rectal cancer Neg Hx    Stomach cancer Neg Hx    Past Surgical History:  Procedure Laterality Date   ABDOMINAL SURGERY     cyst removed    CHOLECYSTECTOMY N/A 07/10/2013   Procedure: LAPAROSCOPIC CHOLECYSTECTOMY;  Surgeon: Donato Heinz, MD;  Location: [facility];  Service: General;  Laterality: N/A;   ESOPHAGOGASTRODUODENOSCOPY N/A 04/30/2013   KTG:[id number] distal  esophagus of uncertain clinical significance-status post biopsy. Tiny hiatal hernia. No explanation   EXAMINATION UNDER ANESTHESIA  04/21/2012   Gluteal wound repair   INCISIONAL HERNIA REPAIR N/A 04/06/2021   Procedure: INCISIONAL HERNIORRHAPHY;  Surgeon: Aviva Signs, MD;  Location: [facility];  Service: General;  Laterality: N/A;   WISDOM TOOTH EXTRACTION     Social History   Occupational History   Occupation: [employer]   Occupation: realtor  Tobacco Use   Smoking status: Former    Packs/day: 0.50    Years: 8.00    Total pack years: 4.00    Types: Cigarettes    Quit date: 09/19/2014    Years since quitting: 8.0   Smokeless tobacco: Never  Vaping Use   Vaping Use: Every day  Substance and Sexual Activity   Alcohol use: Yes    Alcohol/week: 3.0 standard drinks of alcohol    Types: 3 Glasses of wine per week    Comment: scoially   Drug use: No   Sexual activity: Yes    Partners: Male    Birth control/protection: I.U.D.   I spent 58 minutes in the care of the patient today including face-to-face time, preparation to see the patient, as well as review of previous medical notes, gastroenterology notes, review of recent blood work on 06/07/2022, reviewed and independent review of MRI spine images over the last 2 years , counseling and education on medical and holistic treatment options, discussion with possible specialty referral to help with the above diagnoses.   Elba Barman, DO Primary Care Sports Medicine Physician  [place]  This note was dictated using Dragon naturally speaking software and may contain errors in syntax, spelling, or content which have not been identified prior to signing this note.

## 2022-09-22 NOTE — Progress Notes (Signed)
Patient complaining of low back pain; weakness into both legs Trouble with BM and even at times incontinence issues  Pain also radiates up into her arms/neck

## 2022-09-23 ENCOUNTER — Ambulatory Visit: Payer: 59 | Admitting: Sports Medicine

## 2022-09-23 ENCOUNTER — Encounter: Payer: Self-pay | Admitting: Sports Medicine

## 2022-09-26 NOTE — Telephone Encounter (Signed)
Let patient know that if she is taking 2 mg daily and not having Bms within 3-days, we likely will need to be adjunctive and add Miralax daily or Dulcolax every other day to the [facility] as she is already on dose. We could consider IBSrela as well. I do not think that we have samples but we could send in a Rx. Let me know what she would like to do. Thanks. GM

## 2022-09-27 ENCOUNTER — Encounter: Payer: Self-pay | Admitting: Sports Medicine

## 2022-09-28 NOTE — Telephone Encounter (Signed)
Her symptoms are not necessarily consistent with gastroparesis, but we could check for gastric motility and do a SF-GES.  We do not have access to small bowel/Colon motility testing and that is only performed at [facility] with Dr. Janese Banks, that I am aware of in the [facility].  Offer her scheduling of a Gastric emptying.  If this were to be abnormal, it would build a story as to whether, there could be a more global GI dysmotility, though suspect she remains more Chronic Idiopathic Constipation. Thanks. GM

## 2022-09-29 ENCOUNTER — Other Ambulatory Visit: Payer: Self-pay | Admitting: Sports Medicine

## 2022-09-29 DIAGNOSIS — M545 Low back pain, unspecified: Secondary | ICD-10-CM

## 2022-09-30 DIAGNOSIS — F4312 Post-traumatic stress disorder, chronic: Secondary | ICD-10-CM | POA: Diagnosis not present

## 2022-09-30 DIAGNOSIS — F411 Generalized anxiety disorder: Secondary | ICD-10-CM | POA: Diagnosis not present

## 2022-09-30 DIAGNOSIS — F3131 Bipolar disorder, current episode depressed, mild: Secondary | ICD-10-CM | POA: Diagnosis not present

## 2022-10-17 ENCOUNTER — Telehealth: Payer: Self-pay | Admitting: Adult Health

## 2022-10-17 ENCOUNTER — Other Ambulatory Visit: Payer: Self-pay

## 2022-10-17 DIAGNOSIS — F909 Attention-deficit hyperactivity disorder, unspecified type: Secondary | ICD-10-CM

## 2022-10-17 MED ORDER — ADDERALL XR 30 MG PO CP24: 30.0000 mg | ORAL_CAPSULE | Freq: Every day | ORAL | 0 refills | Status: DC

## 2022-10-17 MED ORDER — ADDERALL 10 MG PO TABS: 10.0000 mg | ORAL_TABLET | Freq: Every day | ORAL | 0 refills | Status: DC

## 2022-10-17 NOTE — Telephone Encounter (Signed)
Patient called in for refill on Adderall XR '10mg'$  patient needs name brand for insurance purposes. She also needs Adderall '30mg'$  sent to [facility] [place], [place] Ph: [phone]

## 2022-10-17 NOTE — Telephone Encounter (Signed)
Pended.

## 2022-10-20 ENCOUNTER — Encounter: Payer: Self-pay | Admitting: Adult Health

## 2022-10-20 ENCOUNTER — Ambulatory Visit (INDEPENDENT_AMBULATORY_CARE_PROVIDER_SITE_OTHER): Payer: 59 | Admitting: Adult Health

## 2022-10-20 DIAGNOSIS — F909 Attention-deficit hyperactivity disorder, unspecified type: Secondary | ICD-10-CM

## 2022-10-20 DIAGNOSIS — F331 Major depressive disorder, recurrent, moderate: Secondary | ICD-10-CM | POA: Diagnosis not present

## 2022-10-20 DIAGNOSIS — F422 Mixed obsessional thoughts and acts: Secondary | ICD-10-CM | POA: Diagnosis not present

## 2022-10-20 DIAGNOSIS — F411 Generalized anxiety disorder: Secondary | ICD-10-CM | POA: Diagnosis not present

## 2022-10-20 MED ORDER — ADDERALL 10 MG PO TABS: 10.0000 mg | ORAL_TABLET | Freq: Every day | ORAL | 0 refills | Status: DC

## 2022-10-20 MED ORDER — AMPHETAMINE-DEXTROAMPHETAMINE 10 MG PO TABS: 10.0000 mg | ORAL_TABLET | Freq: Every day | ORAL | 0 refills | Status: DC

## 2022-10-20 MED ORDER — ADDERALL XR 30 MG PO CP24: 30.0000 mg | ORAL_CAPSULE | Freq: Every day | ORAL | 0 refills | Status: DC

## 2022-10-20 MED ORDER — CLONAZEPAM 0.5 MG PO TABS: 0.5000 mg | ORAL_TABLET | Freq: Every day | ORAL | 2 refills | Status: DC

## 2022-10-20 MED ORDER — SERTRALINE HCL 100 MG PO TABS: ORAL_TABLET | ORAL | 1 refills | Status: DC

## 2022-10-20 NOTE — Progress Notes (Addendum)
Cristina Davenport [id number] [birth year] 36 y.o.  Subjective:   Patient ID:  Cristina Davenport is a 36 y.o. (DOB [birth year]) female.  Chief Complaint: No chief complaint on file.   HPI ALLYE HOYOS presents to the office today for follow-up of ADHD, MDD, GAD, and obsessional thoughts and acts.   Describes mood today as "not too good". Pleasant. Mood symptoms - reports depression and anxiety - "more frequent episodes". Feels irritable at times - "working on meditation". Reports worry, rumination, and overthinking. Mood is variable - depends on situation. Stating "I'm having more bad days". Reporting headaches and back pain - working with provider for [employer]. Feels like medications are working well. Improved interest and motivation. Taking medications as prescribed. Energy levels lower. Active, has a regular exercise routine - yoga. Enjoys some usual interests and activities. Married. Lives with husband. Has 3 children - dog. Family local. Spending time with family. Appetite adequate. Weight loss - 139 pounds. Sleeps well most nights. Averages 6 hours - broken sleep. Focus and concentration improved. Completing tasks. Managing aspects of household. 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  Denies AH or VH.  Previous medication trials: Zoloft, Hydroxyzine, Adderall XR '20mg'$  daily, Lamictal   AUDIT    Flowsheet Row Office Visit from 12/29/2021 in [facility]  Alcohol Use Disorder Identification Test Final Score (AUDIT) 4      GAD-7    Flowsheet Row Office Visit from 12/29/2021 in [facility] Office Visit from 10/28/2020 in [facility] from 06/19/2020 in [facility] from 03/06/2020 in [facility] Visit from 08/17/2018 in [facility]  Total GAD-7 Score '19 19 17 21 11      '$ PHQ2-9    [place] Visit from 12/29/2021 in [facility] Office Visit from 01/19/2021 in  [facility] Visit from 10/28/2020 in [facility] from 06/19/2020 in [facility] from 03/06/2020 in [facility]  PHQ-2 Total Score '4 6 1 1 '$ 0  PHQ-9 Total Score '19 18 7 8 15      '$ Flowsheet Row Admission (Discharged) from 04/06/2021 in [facility] from 04/02/2021 in [facility] from 03/25/2021 in [facility] No Risk No Risk Error: Question 6 not populated        Review of Systems:  Review of Systems  Musculoskeletal:  Negative for gait problem.  Neurological:  Negative for tremors.  Psychiatric/Behavioral:         Please refer to HPI    Medications: I have reviewed the patient's current medications.  Current Outpatient Medications  Medication Sig Dispense Refill   acetaminophen (TYLENOL) 500 MG tablet Take 500 mg by mouth 2 (two) times daily as needed for moderate pain. (Patient not taking: Reported on 12/29/2021)     ADDERALL 10 MG tablet Take 1 tablet (10 mg total) by mouth daily. 30 tablet 0   ADDERALL XR 30 MG 24 hr capsule Take 1 capsule (30 mg total) by mouth daily. 30 capsule 0   [START ON 11/17/2022] ADDERALL XR 30 MG 24 hr capsule Take 1 capsule (30 mg total) by mouth daily. 30 capsule 0   [START ON 12/15/2022] ADDERALL XR 30 MG 24 hr capsule Take 1 capsule (30 mg total) by mouth daily. 30 capsule 0   [START ON 11/17/2022] amphetamine-dextroamphetamine (ADDERALL) 10 MG tablet  Take 1 tablet (10 mg total) by mouth daily. 30 tablet 0   [START ON 12/15/2022] amphetamine-dextroamphetamine (ADDERALL) 10 MG tablet Take 1 tablet (10 mg total) by mouth daily. 30 tablet 0   clonazePAM (KLONOPIN) 0.5 MG tablet Take 1 tablet (0.5 mg total) by mouth at bedtime. 30 tablet 2   Cyanocobalamin (B-12) 5000 MCG CAPS Take 5,000 mcg by mouth daily. (Patient not taking: Reported on 01/17/2022)      levonorgestrel (LILETTA, 52 MG,) 19.5 MCG/DAY IUD IUD 1 each by Intrauterine route once.     linaclotide (LINZESS) 72 MCG capsule Take 1 capsule (72 mcg total) by mouth daily before breakfast. 30 capsule 1   Magnesium 400 MG TABS Take 400 mg by mouth daily. (Patient not taking: Reported on 01/17/2022)     pantoprazole (PROTONIX) 40 MG tablet TAKE 1 TABLET BY MOUTH DAILY BEFORE BREAKFAST 90 tablet 0   Prucalopride Succinate (MOTEGRITY) 2 MG TABS Take 1 tablet (2 mg total) by mouth daily. 30 tablet 6   sertraline (ZOLOFT) 100 MG tablet TAKE 2 TABLETS BY MOUTH EVERY DAY 180 tablet 1   trichloroacetic acid 80 % LIQD Dispense for in office use 15 mL 0   No current facility-administered medications for this visit.    Medication Side Effects: None  Allergies:  Allergies  Allergen Reactions   Latex Other (See Comments)    General irritation   Doxycycline Rash    Solar rash    Past Medical History:  Diagnosis Date   Anxiety    Anxiety    Bipolar 1 disorder (HCC)    Depression    Family history of adverse reaction to anesthesia    GERD (gastroesophageal reflux disease)    Hypertension    PIH   IBS (irritable bowel syndrome)    Migraine without aura     Past Medical History, Surgical history, Social history, and Family history were reviewed and updated as appropriate.   Please see review of systems for further details on the patient's review from today.   Objective:   Physical Exam:  There were no vitals taken for this visit.  Physical Exam Constitutional:      General: She is not in acute distress. Musculoskeletal:        General: No deformity.  Neurological:     Mental Status: She is alert and oriented to person, place, and time.     Coordination: Coordination normal.  Psychiatric:        Attention and Perception: Attention and perception normal. She does not perceive auditory or visual hallucinations.        Mood and Affect: Mood normal. Mood is not anxious or depressed.  Affect is not labile, blunt, angry or inappropriate.        Speech: Speech normal.        Behavior: Behavior normal.        Thought Content: Thought content normal. Thought content is not paranoid or delusional. Thought content does not include homicidal or suicidal ideation. Thought content does not include homicidal or suicidal plan.        Cognition and Memory: Cognition and memory normal.        Judgment: Judgment normal.     Comments: Insight intact     Lab Review:     Component Value Date/Time   NA 137 06/07/2022 1216   NA 137 12/29/2021 0959   K 3.6 06/07/2022 1216   CL 103 06/07/2022 1216   CO2 27 06/07/2022 1216  GLUCOSE 88 06/07/2022 1216   BUN 8 06/07/2022 1216   BUN 9 12/29/2021 0959   CREATININE 0.70 06/07/2022 1216   CALCIUM 9.1 06/07/2022 1216   PROT 7.4 06/07/2022 1216   PROT 7.2 12/29/2021 0959   ALBUMIN 4.7 06/07/2022 1216   ALBUMIN 4.9 (H) 12/29/2021 0959   AST 14 06/07/2022 1216   ALT 10 06/07/2022 1216   ALKPHOS 47 06/07/2022 1216   BILITOT 0.8 06/07/2022 1216   BILITOT 0.3 12/29/2021 0959   GFRNONAA >60 03/28/2021 1131   GFRAA >60 10/15/2019 0909       Component Value Date/Time   WBC 7.6 06/07/2022 1216   RBC 4.63 06/07/2022 1216   HGB 14.0 06/07/2022 1216   HGB 14.7 12/29/2021 0959   HGB 13.3 09/23/2015 1638   HCT 41.4 06/07/2022 1216   HCT 42.9 12/29/2021 0959   PLT 265.0 06/07/2022 1216   PLT 260 12/29/2021 0959   MCV 89.4 06/07/2022 1216   MCV 89 12/29/2021 0959   MCH 30.3 12/29/2021 0959   MCH 30.0 03/28/2021 1131   MCHC 33.8 06/07/2022 1216   RDW 12.7 06/07/2022 1216   RDW 12.4 12/29/2021 0959   LYMPHSABS 2.0 06/07/2022 1216   LYMPHSABS 2.0 05/10/2019 1445   MONOABS 0.6 06/07/2022 1216   EOSABS 0.2 06/07/2022 1216   EOSABS 0.1 05/10/2019 1445   BASOSABS 0.0 06/07/2022 1216   BASOSABS 0.0 05/10/2019 1445    No results found for: "POCLITH", "LITHIUM"   No results found for: "PHENYTOIN", "PHENOBARB", "VALPROATE", "CBMZ"    .res Assessment: Plan:    Plan:  PDMP reviewed  Adderall '10mg'$  daily - plans to take earlier in the day Adderall XR '30mg'$  daily Zoloft '200mg'$  daily Clonazepam 0.'5mg'$  daily - uses as needed for   Seeing therapist  RTC 3 months  Monitor BP between visits while taking stimulant medication.   112/69/86  Patient advised to contact office with any questions, adverse effects, or acute worsening in signs and symptoms.  Discussed potential benefits, risks, and side effects of stimulants with patient to include increased heart rate, palpitations, insomnia, increased anxiety, increased irritability, or decreased appetite.  Instructed patient to contact office if experiencing any significant tolerability issues.  Diagnoses and all orders for this visit:  Attention deficit hyperactivity disorder (ADHD), unspecified ADHD type -     ADDERALL XR 30 MG 24 hr capsule; Take 1 capsule (30 mg total) by mouth daily. -     ADDERALL 10 MG tablet; Take 1 tablet (10 mg total) by mouth daily. -     amphetamine-dextroamphetamine (ADDERALL) 10 MG tablet; Take 1 tablet (10 mg total) by mouth daily. -     amphetamine-dextroamphetamine (ADDERALL) 10 MG tablet; Take 1 tablet (10 mg total) by mouth daily. -     ADDERALL XR 30 MG 24 hr capsule; Take 1 capsule (30 mg total) by mouth daily. -     ADDERALL XR 30 MG 24 hr capsule; Take 1 capsule (30 mg total) by mouth daily.  Major depressive disorder, recurrent episode, moderate (HCC) -     sertraline (ZOLOFT) 100 MG tablet; TAKE 2 TABLETS BY MOUTH EVERY DAY  Generalized anxiety disorder -     clonazePAM (KLONOPIN) 0.5 MG tablet; Take 1 tablet (0.5 mg total) by mouth at bedtime. -     sertraline (ZOLOFT) 100 MG tablet; TAKE 2 TABLETS BY MOUTH EVERY DAY  Mixed obsessional thoughts and acts     Please see After Visit Summary for patient specific instructions.  Future  Appointments  Date Time Provider [place]  11/24/2022 11:00 AM Michela Pitcher, NP  [facility]  01/19/2023 10:00 AM Kaydee Magel, Berdie Ogren, NP [facility] None    No orders of the defined types were placed in this encounter.   -------------------------------

## 2022-10-20 NOTE — Addendum Note (Signed)
Addended by: Aloha Gell on: 10/20/2022 09:35 AM   Modules accepted: Orders

## 2022-11-01 NOTE — Telephone Encounter (Signed)
Sw pt on phone

## 2022-11-02 ENCOUNTER — Telehealth: Payer: Self-pay | Admitting: Sports Medicine

## 2022-11-02 NOTE — Telephone Encounter (Signed)
Pt's husband Rolla Plate called requesting Dr. Rolena Infante send pt's referral STAT to [facility]. He stated they can't see her until June and [facility] claims pt is withering away and need t be seen right away. Please call Rolla Plate about this matter at [phone]

## 2022-11-03 ENCOUNTER — Other Ambulatory Visit: Payer: Self-pay | Admitting: Sports Medicine

## 2022-11-04 DIAGNOSIS — R4189 Other symptoms and signs involving cognitive functions and awareness: Secondary | ICD-10-CM | POA: Diagnosis not present

## 2022-11-04 DIAGNOSIS — Z23 Encounter for immunization: Secondary | ICD-10-CM | POA: Diagnosis not present

## 2022-11-04 DIAGNOSIS — G9332 Myalgic encephalomyelitis/chronic fatigue syndrome: Secondary | ICD-10-CM | POA: Diagnosis not present

## 2022-11-11 NOTE — Progress Notes (Signed)
Entered in error. Referral already sent to [facility].

## 2022-11-21 ENCOUNTER — Telehealth: Payer: Self-pay

## 2022-11-21 NOTE — Telephone Encounter (Signed)
Patient notified as instructed by telephone and verbalized understanding. 

## 2022-11-21 NOTE — Telephone Encounter (Addendum)
I see she has appt with Romilda Garret scheduled this week.  I'm glad he's able to see her - will take longer to get new pt appt with me so recommend she keep new pt appt with him.

## 2022-11-21 NOTE — Telephone Encounter (Signed)
Patient called in and was wondering if Dr. Darnell Level would take her on as a new patient. Her previous PCP passed away and her husband Bard Herbert currently sees Dr. Darnell Level. Please advise. Thank you!

## 2022-11-24 ENCOUNTER — Encounter: Payer: Self-pay | Admitting: Nurse Practitioner

## 2022-11-24 ENCOUNTER — Ambulatory Visit (INDEPENDENT_AMBULATORY_CARE_PROVIDER_SITE_OTHER): Payer: 59 | Admitting: Nurse Practitioner

## 2022-11-24 VITALS — BP 112/68 | HR 89 | Ht 61.0 in | Wt 131.0 lb

## 2022-11-24 DIAGNOSIS — G2581 Restless legs syndrome: Secondary | ICD-10-CM

## 2022-11-24 DIAGNOSIS — F902 Attention-deficit hyperactivity disorder, combined type: Secondary | ICD-10-CM | POA: Diagnosis not present

## 2022-11-24 DIAGNOSIS — F419 Anxiety disorder, unspecified: Secondary | ICD-10-CM | POA: Diagnosis not present

## 2022-11-24 DIAGNOSIS — M2550 Pain in unspecified joint: Secondary | ICD-10-CM | POA: Diagnosis not present

## 2022-11-24 DIAGNOSIS — R5383 Other fatigue: Secondary | ICD-10-CM | POA: Diagnosis not present

## 2022-11-24 LAB — CBC
HCT: 40.7 % (ref 36.0–46.0)
Hemoglobin: 13.7 g/dL (ref 12.0–15.0)
MCHC: 33.5 g/dL (ref 30.0–36.0)
MCV: 89.4 fl (ref 78.0–100.0)
Platelets: 272 10*3/uL (ref 150.0–400.0)
RBC: 4.56 Mil/uL (ref 3.87–5.11)
RDW: 12.8 % (ref 11.5–15.5)
WBC: 8 10*3/uL (ref 4.0–10.5)

## 2022-11-24 LAB — COMPREHENSIVE METABOLIC PANEL
ALT: 12 U/L (ref 0–35)
AST: 13 U/L (ref 0–37)
Albumin: 4.7 g/dL (ref 3.5–5.2)
Alkaline Phosphatase: 50 U/L (ref 39–117)
BUN: 12 mg/dL (ref 6–23)
CO2: 30 mEq/L (ref 19–32)
Calcium: 9.1 mg/dL (ref 8.4–10.5)
Chloride: 102 mEq/L (ref 96–112)
Creatinine, Ser: 0.66 mg/dL (ref 0.40–1.20)
GFR: 114.36 mL/min (ref >60.00)
Glucose, Bld: 87 mg/dL (ref 70–99)
Potassium: 3.8 mEq/L (ref 3.5–5.1)
Sodium: 138 mEq/L (ref 135–145)
Total Bilirubin: 0.5 mg/dL (ref 0.2–1.2)
Total Protein: 7 g/dL (ref 6.0–8.3)

## 2022-11-24 LAB — IBC + FERRITIN
Ferritin: 85.6 ng/mL (ref 10.0–291.0)
Iron: 82 ug/dL (ref 42–145)
Saturation Ratios: 23.7 % (ref 20.0–50.0)
TIBC: 345.8 ug/dL (ref 250.0–450.0)
Transferrin: 247 mg/dL (ref 212.0–360.0)

## 2022-11-24 LAB — VITAMIN D 25 HYDROXY (VIT D DEFICIENCY, FRACTURES): VITD: 71.03 ng/mL (ref 30.00–100.00)

## 2022-11-24 LAB — VITAMIN B12: Vitamin B-12: 390 pg/mL (ref 211–911)

## 2022-11-24 NOTE — Assessment & Plan Note (Signed)
On Zoloft and Klonopin as needed.  Followed by behavioral health provider.  Continue following up with behavioral health provider as recommended take medication as prescribed.

## 2022-11-24 NOTE — Assessment & Plan Note (Signed)
Will do Western blot Lyme disease testing albeit patient has no recent history of memory of having a tick on her removing a tick.  Patient had ANA and sed rate that were both negative

## 2022-11-24 NOTE — Assessment & Plan Note (Signed)
All the patient's labs thus far recommend normal inclusive of the extended testing with neurology.  Will check an iron level inclusive of B12 and vitamin D.

## 2022-11-24 NOTE — Patient Instructions (Signed)
Nice to see you today I will be in touch with the labs once I have them Follow up with me in 6 months for your physical, sooner if you need me 

## 2022-11-24 NOTE — Assessment & Plan Note (Signed)
Check vitamin D, iron, B12.

## 2022-11-24 NOTE — Progress Notes (Signed)
New Patient Office Visit  Subjective    Patient ID: Cristina Davenport, female    DOB: 21-Mar-1986  Age: 37 y.o. MRN: [id number]  CC:  Chief Complaint  Patient presents with   Establish Care    Last PCP 2023    HPI Cristina Davenport presents to establish care   State that she is having a compression sensation in her back. She is also experincing a shooting pain that will go up her back, neck and going to the right eye. She is having some light and sound sensitivity. She has not seen an eye doctor as of yet because they moved from [facility].  She has been seen by her previous primary care provider who unfortunately passed away, sports medicine, neurology.  Patient is in the midst of a workup with neurology.  Neurology also wanted her to get neurocognitive tested but that appointment is not until September per patient report. Outside neurology she has been seen by sports medicine/orthopedist and gastroenterology  She also endorses a restlessness or fatigability of her legs with use.  States it also happened in her upper extremities with similar findings such as writing.  Not so much restless without movement.  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  She also endorses fatigue and wanting to sleep often even with adequate nights rest.  When questioned she denies ever getting it taken off of her in the recent years.  She does mention that she likes to garden and play golf.  On top of the symptoms patient also has memory issues which is why she has been referred for neurocognitive testing.  She was distractible in office and will get off train of thought sometimes.  [place]; patient sees her regular provider every 3 to 6 months per her report.  She is currently maintained on Adderall XR, Adderall IR, Klonopin, Zoloft.    Outpatient Encounter Medications as of 11/24/2022  Medication Sig   acetaminophen (TYLENOL) 500 MG tablet  Take 500 mg by mouth 2 (two) times daily as needed for moderate pain.   ADDERALL 10 MG tablet Take 1 tablet (10 mg total) by mouth daily.   ADDERALL XR 30 MG 24 hr capsule Take 1 capsule (30 mg total) by mouth daily.   ADDERALL XR 30 MG 24 hr capsule Take 1 capsule (30 mg total) by mouth daily.   [START ON 12/15/2022] ADDERALL XR 30 MG 24 hr capsule Take 1 capsule (30 mg total) by mouth daily.   amphetamine-dextroamphetamine (ADDERALL) 10 MG tablet Take 1 tablet (10 mg total) by mouth daily.   [START ON 12/15/2022] amphetamine-dextroamphetamine (ADDERALL) 10 MG tablet Take 1 tablet (10 mg total) by mouth daily.   clindamycin (CLEOCIN T) 1 % lotion Apply topically.   clonazePAM (KLONOPIN) 0.5 MG tablet Take 1 tablet (0.5 mg total) by mouth at bedtime.   Cyanocobalamin (B-12) 5000 MCG CAPS Take 5,000 mcg by mouth daily.   levonorgestrel (LILETTA, 52 MG,) 19.5 MCG/DAY IUD IUD 1 each by Intrauterine route once.   Magnesium 400 MG TABS Take 400 mg by mouth daily.   Prucalopride Succinate (MOTEGRITY) 2 MG TABS Take 1 tablet (2 mg total) by mouth daily.   sertraline (ZOLOFT) 100 MG tablet TAKE 2 TABLETS BY MOUTH EVERY DAY   tretinoin (RETIN-A) 0.05 % cream Apply topically.   trichloroacetic acid 80 % LIQD Dispense for in office use   [DISCONTINUED]  linaclotide (LINZESS) 72 MCG capsule Take 1 capsule (72 mcg total) by mouth daily before breakfast.   [DISCONTINUED] pantoprazole (PROTONIX) 40 MG tablet TAKE 1 TABLET BY MOUTH DAILY BEFORE BREAKFAST   No facility-administered encounter medications on file as of 11/24/2022.    Past Medical History:  Diagnosis Date   Anxiety    Anxiety    Bipolar 1 disorder ([place])    Depression    Family history of adverse reaction to anesthesia    GERD (gastroesophageal reflux disease)    Hypertension    PIH   IBS (irritable bowel syndrome)    Migraine without aura     Past Surgical History:  Procedure Laterality Date   ABDOMINAL SURGERY     cyst removed     CHOLECYSTECTOMY N/A 07/10/2013   Procedure: LAPAROSCOPIC CHOLECYSTECTOMY;  Surgeon: Donato Heinz, MD;  Location: [facility];  Service: General;  Laterality: N/A;   ESOPHAGOGASTRODUODENOSCOPY N/A 04/30/2013   [id number] distal esophagus of uncertain clinical significance-status post biopsy. Tiny hiatal hernia. No explanation   EXAMINATION UNDER ANESTHESIA  04/21/2012   Gluteal wound repair   INCISIONAL HERNIA REPAIR N/A 04/06/2021   Procedure: Fatima Blank HERNIORRHAPHY;  Surgeon: Aviva Signs, MD;  Location: [facility];  Service: General;  Laterality: N/A;   WISDOM TOOTH EXTRACTION      Family History  Adopted: Yes  Problem Relation Age of Onset   Mental illness Mother    Cancer Father        skin cancer   Hypertension Maternal Grandmother    Stroke Maternal Grandmother    Kidney failure Maternal Grandmother    Hypertension Maternal Grandfather    Stroke Maternal Grandfather    Cancer Maternal Grandfather        skin   Crohn's disease Cousin    Colon cancer Neg Hx    Rectal cancer Neg Hx    Stomach cancer Neg Hx     Social History   Socioeconomic History   Marital status: Married    Spouse name: Bard Herbert   Number of children: 3   Years of education: 12   Highest education level: Some college, no degree  Occupational History   Occupation: Wellsite geologist   Occupation: realtor  Tobacco Use   Smoking status: Former    Packs/day: 0.50    Years: 8.00    Total pack years: 4.00    Types: Cigarettes    Quit date: 09/19/2014    Years since quitting: 8.1   Smokeless tobacco: Never  Vaping Use   Vaping Use: Every day   Substances: Nicotine, Flavoring  Substance and Sexual Activity   Alcohol use: Yes    Alcohol/week: 3.0 standard drinks of alcohol    Types: 3 Glasses of wine per week    Comment: scoially   Drug use: Yes    Types: Marijuana    Comment: for pain in the back   Sexual activity: Yes    Partners: Male    Birth control/protection: I.U.D.  Other Topics  Concern   Not on file  Social History Narrative   Married since July 2020,been together for 10 years.Lives with husband and kids.   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   Social Determinants of Health   Financial Resource Strain: Low Risk  (12/29/2021)   Overall Financial Resource Strain (CARDIA)    Difficulty of Paying Living Expenses: Not hard at all  Food Insecurity: No Food Insecurity (12/29/2021)   Hunger Vital Sign  Worried About Charity fundraiser in the Last Year: Never true    [place] in the Last Year: Never true  Transportation Needs: No Transportation Needs (12/29/2021)   PRAPARE - Hydrologist (Medical): No    Lack of Transportation (Non-Medical): No  Physical Activity: Sufficiently Active (12/29/2021)   Exercise Vital Sign    Days of Exercise per Week: 5 days    Minutes of Exercise per Session: 40 min  Stress: Stress Concern Present (12/29/2021)   [place]    Feeling of Stress : Very much  Social Connections: Unknown (12/29/2021)   Social Connection and Isolation Panel [NHANES]    Frequency of Communication with Friends and Family: More than three times a week    Frequency of Social Gatherings with Friends and Family: Once a week    Attends Religious Services: Patient refused    Active Member of Clubs or Organizations: No    Attends Music therapist: More than 4 times per year    Marital Status: Married  Human resources officer Violence: Not At Risk (12/29/2021)   Humiliation, Afraid, Rape, and Kick questionnaire    Fear of Current or Ex-Partner: No    Emotionally Abused: No    Physically Abused: No    Sexually Abused: No    Review of Systems  Constitutional:  Positive for chills. Negative for fever.  Respiratory:  Negative for shortness of breath.   Cardiovascular:  Negative for chest pain.  Gastrointestinal:  Positive for  constipation. Negative for abdominal pain, diarrhea, nausea and vomiting.  Neurological:  Positive for tingling (burning).  Psychiatric/Behavioral:  Negative for hallucinations and suicidal ideas. The patient does not have insomnia.         Objective    BP 112/68   Pulse 89   Ht '5\' 1"'$  (1.549 m)   Wt 131 lb (59.4 kg)   LMP 11/10/2022 (Approximate)   SpO2 99%   BMI 24.75 kg/m   Physical Exam Vitals and nursing note reviewed.  Constitutional:      Appearance: Normal appearance.  HENT:     Right Ear: Tympanic membrane, ear canal and external ear normal.     Left Ear: Tympanic membrane, ear canal and external ear normal.  Cardiovascular:     Rate and Rhythm: Normal rate and regular rhythm.     Pulses: Normal pulses.     Heart sounds: Normal heart sounds.  Pulmonary:     Effort: Pulmonary effort is normal.     Breath sounds: Normal breath sounds.  Musculoskeletal:     Right lower leg: No edema.     Left lower leg: No edema.  Lymphadenopathy:     Cervical: No cervical adenopathy.  Neurological:     General: No focal deficit present.     Mental Status: She is alert.     Deep Tendon Reflexes:     Reflex Scores:      Bicep reflexes are 2+ on the right side and 2+ on the left side.      Patellar reflexes are 2+ on the right side and 2+ on the left side.    Comments: Bilateral upper and lower extremity strength 5/5  Psychiatric:        Mood and Affect: Mood normal.        Behavior: Behavior normal.        Thought Content: Thought content normal.  Assessment & Plan:   Problem List Items Addressed This Visit       Other   Anxiety    On Zoloft and Klonopin as needed.  Followed by behavioral health provider.  Continue following up with behavioral health provider as recommended take medication as prescribed.      Attention deficit hyperactivity disorder (ADHD), combined type    Patient currently maintained on Adderall XR and Adderall IR.  She is followed  behavioral health provider.  Continue take medication as prescribed follow-up behavioral health provider as recommended      Pain in joint, multiple sites - Primary    Will do Western blot Lyme disease testing albeit patient has no recent history of memory of having a tick on her removing a tick.  Patient had ANA and sed rate that were both negative      Relevant Orders   B. burgdorfi antibodies by WB   Other fatigue    Check vitamin D, iron, B12.      Relevant Orders   Vitamin B12   VITAMIN D 25 Hydroxy (Vit-D Deficiency, Fractures)   IBC + Ferritin   Restless leg    All the patient's labs thus far recommend normal inclusive of the extended testing with neurology.  Will check an iron level inclusive of B12 and vitamin D.      Relevant Orders   CBC   Comprehensive metabolic panel   IBC + Ferritin    Return in about 6 months (around 05/25/2023) for CPE and Labs.   Romilda Garret, NP

## 2022-11-24 NOTE — Assessment & Plan Note (Signed)
Patient currently maintained on Adderall XR and Adderall IR.  She is followed behavioral health provider.  Continue take medication as prescribed follow-up behavioral health provider as recommended

## 2022-11-28 DIAGNOSIS — M546 Pain in thoracic spine: Secondary | ICD-10-CM | POA: Diagnosis not present

## 2022-11-28 DIAGNOSIS — M545 Low back pain, unspecified: Secondary | ICD-10-CM | POA: Diagnosis not present

## 2022-11-28 DIAGNOSIS — M542 Cervicalgia: Secondary | ICD-10-CM | POA: Diagnosis not present

## 2022-11-28 LAB — B. BURGDORFI ANTIBODIES BY WB

## 2022-11-29 ENCOUNTER — Encounter: Payer: Self-pay | Admitting: Nurse Practitioner

## 2022-12-05 DIAGNOSIS — R200 Anesthesia of skin: Secondary | ICD-10-CM | POA: Diagnosis not present

## 2022-12-20 DIAGNOSIS — R5383 Other fatigue: Secondary | ICD-10-CM | POA: Diagnosis not present

## 2022-12-20 DIAGNOSIS — G9332 Myalgic encephalomyelitis/chronic fatigue syndrome: Secondary | ICD-10-CM | POA: Diagnosis not present

## 2022-12-20 DIAGNOSIS — R413 Other amnesia: Secondary | ICD-10-CM | POA: Diagnosis not present

## 2022-12-20 DIAGNOSIS — R4189 Other symptoms and signs involving cognitive functions and awareness: Secondary | ICD-10-CM | POA: Diagnosis not present

## 2022-12-28 DIAGNOSIS — G629 Polyneuropathy, unspecified: Secondary | ICD-10-CM | POA: Diagnosis not present

## 2022-12-29 DIAGNOSIS — G5603 Carpal tunnel syndrome, bilateral upper limbs: Secondary | ICD-10-CM | POA: Diagnosis not present

## 2023-01-03 ENCOUNTER — Encounter: Payer: Self-pay | Admitting: Nurse Practitioner

## 2023-01-03 DIAGNOSIS — M2550 Pain in unspecified joint: Secondary | ICD-10-CM

## 2023-01-04 ENCOUNTER — Ambulatory Visit (INDEPENDENT_AMBULATORY_CARE_PROVIDER_SITE_OTHER): Payer: 59 | Admitting: Family Medicine

## 2023-01-04 ENCOUNTER — Encounter: Payer: Self-pay | Admitting: Family Medicine

## 2023-01-04 ENCOUNTER — Ambulatory Visit (INDEPENDENT_AMBULATORY_CARE_PROVIDER_SITE_OTHER)
Admission: RE | Admit: 2023-01-04 | Discharge: 2023-01-04 | Disposition: A | Discharge status: Home / Self Care | Payer: 59 | Source: Ambulatory Visit | Attending: Family Medicine | Admitting: Family Medicine

## 2023-01-04 VITALS — BP 106/60 | HR 105 | Temp 98.6°F | Ht 61.0 in | Wt 133.0 lb

## 2023-01-04 DIAGNOSIS — R0602 Shortness of breath: Secondary | ICD-10-CM

## 2023-01-04 DIAGNOSIS — U071 COVID-19: Secondary | ICD-10-CM | POA: Diagnosis not present

## 2023-01-04 DIAGNOSIS — R5383 Other fatigue: Secondary | ICD-10-CM

## 2023-01-04 DIAGNOSIS — M2550 Pain in unspecified joint: Secondary | ICD-10-CM

## 2023-01-04 LAB — CBC WITH DIFFERENTIAL/PLATELET
Basophils Absolute: 0.1 10*3/uL (ref 0.0–0.1)
Basophils Relative: 0.9 % (ref 0.0–3.0)
Eosinophils Absolute: 0.1 10*3/uL (ref 0.0–0.7)
Eosinophils Relative: 2.1 % (ref 0.0–5.0)
HCT: 39.4 % (ref 36.0–46.0)
Hemoglobin: 13.1 g/dL (ref 12.0–15.0)
Lymphocytes Relative: 7.1 % — ABNORMAL LOW (ref 12.0–46.0)
Lymphs Abs: 0.4 10*3/uL — ABNORMAL LOW (ref 0.7–4.0)
MCHC: 33.2 g/dL (ref 30.0–36.0)
MCV: 89.8 fl (ref 78.0–100.0)
Monocytes Absolute: 0.7 10*3/uL (ref 0.1–1.0)
Monocytes Relative: 10.8 % (ref 3.0–12.0)
Neutro Abs: 4.8 10*3/uL (ref 1.4–7.7)
Neutrophils Relative %: 79.1 % — ABNORMAL HIGH (ref 43.0–77.0)
Platelets: 235 10*3/uL (ref 150.0–400.0)
RBC: 4.39 Mil/uL (ref 3.87–5.11)
RDW: 13.1 % (ref 11.5–15.5)
WBC: 6.1 10*3/uL (ref 4.0–10.5)

## 2023-01-04 LAB — POCT INFLUENZA A/B
Influenza A, POC: NEGATIVE
Influenza B, POC: NEGATIVE

## 2023-01-04 LAB — POC COVID19 BINAXNOW: SARS Coronavirus 2 Ag: POSITIVE — AB

## 2023-01-04 LAB — SEDIMENTATION RATE: Sed Rate: 6 mm/hr (ref 0–20)

## 2023-01-04 MED ORDER — NIRMATRELVIR/RITONAVIR (PAXLOVID)TABLET: 3.0000 | ORAL_TABLET | Freq: Two times a day (BID) | ORAL | 0 refills | Status: AC

## 2023-01-04 NOTE — Progress Notes (Signed)
Patient ID: Cristina Davenport, female    DOB: 1986-07-24, 37 y.o.   MRN: [id number]  This visit was conducted in person.  BP 106/60 (BP Location: Left Arm, Patient Position: Sitting, Cuff Size: Normal)   Pulse (!) 105   Temp 98.6 F (37 C) (Temporal)   Ht 5' 1"$  (1.549 m)   Wt 133 lb (60.3 kg)   LMP 01/02/2023   SpO2 99%   BMI 25.13 kg/m    CC:  Chief Complaint  Patient presents with   Generalized Body Aches    Also has a head ache today and it hurts her to move her eyes from side to side    Subjective:   HPI: Cristina Davenport is a 37 y.o. female presenting on 01/04/2023 for Generalized Body Aches (Also has a head ache today and it hurts her to move her eyes from side to side)   She has been in midst of work up for chronic fatigue, anxiety, pain in multiple joints, headache  Reviewed OV from [facility] 12/20/2022 for myalgic encephalomyelitis/chronic fatigue syndrome from [place] MRI brain: unremarkable  She has been experiencing post exertional malaise, body pain... ongoing since 03/2022... ever since [place] last year.  She woke up this AM feeling sick from head to toe. Headache, sensitive to light.  She is feeling weak overall. Pain behind eyes, hurts to look side to side.  Chest tightness,  shortness of breath.  Cough congestion in last 24 hours.   No known sick contacts.   Relevant past medical, surgical, family and social history reviewed and updated as indicated. Interim medical history since our last visit reviewed. Allergies and medications reviewed and updated. Outpatient Medications Prior to Visit  Medication Sig Dispense Refill   acetaminophen (TYLENOL) 500 MG tablet Take 500 mg by mouth 2 (two) times daily as needed for moderate pain.     ADDERALL XR 30 MG 24 hr capsule Take 1 capsule (30 mg total) by mouth daily. 30 capsule 0   amphetamine-dextroamphetamine (ADDERALL) 10 MG tablet Take 1 tablet (10 mg total) by mouth daily. 30 tablet 0   clindamycin (CLEOCIN T) 1  % lotion Apply topically.     clonazePAM (KLONOPIN) 0.5 MG tablet Take 1 tablet (0.5 mg total) by mouth at bedtime. 30 tablet 2   Cyanocobalamin (B-12) 5000 MCG CAPS Take 5,000 mcg by mouth daily.     levonorgestrel (LILETTA, 52 MG,) 19.5 MCG/DAY IUD IUD 1 each by Intrauterine route once.     Magnesium 400 MG TABS Take 400 mg by mouth daily.     Prucalopride Succinate (MOTEGRITY) 2 MG TABS Take 1 tablet (2 mg total) by mouth daily. 30 tablet 6   sertraline (ZOLOFT) 100 MG tablet TAKE 2 TABLETS BY MOUTH EVERY DAY 180 tablet 1   tretinoin (RETIN-A) 0.05 % cream Apply topically.     trichloroacetic acid 80 % LIQD Dispense for in office use 15 mL 0   ADDERALL 10 MG tablet Take 1 tablet (10 mg total) by mouth daily. (Patient not taking: Reported on 01/04/2023) 30 tablet 0   ADDERALL XR 30 MG 24 hr capsule Take 1 capsule (30 mg total) by mouth daily. (Patient not taking: Reported on 01/04/2023) 30 capsule 0   ADDERALL XR 30 MG 24 hr capsule Take 1 capsule (30 mg total) by mouth daily. (Patient not taking: Reported on 01/04/2023) 30 capsule 0   amphetamine-dextroamphetamine (ADDERALL) 10 MG tablet Take 1 tablet (10 mg total) by mouth daily. (  Patient not taking: Reported on 01/04/2023) 30 tablet 0   No facility-administered medications prior to visit.     Per HPI unless specifically indicated in ROS section below Review of Systems  Constitutional:  Positive for fatigue. Negative for fever.  HENT:  Positive for sinus pressure and sinus pain. Negative for congestion.   Eyes:  Negative for pain.  Respiratory:  Positive for cough, chest tightness and shortness of breath.   Cardiovascular:  Negative for chest pain, palpitations and leg swelling.  Gastrointestinal:  Negative for abdominal pain.  Genitourinary:  Negative for dysuria and vaginal bleeding.  Musculoskeletal:  Negative for back pain.  Neurological:  Negative for syncope, light-headedness and headaches.  Psychiatric/Behavioral:  Negative for  dysphoric mood.    Objective:  BP 106/60 (BP Location: Left Arm, Patient Position: Sitting, Cuff Size: Normal)   Pulse (!) 105   Temp 98.6 F (37 C) (Temporal)   Ht 5' 1"$  (1.549 m)   Wt 133 lb (60.3 kg)   LMP 01/02/2023   SpO2 99%   BMI 25.13 kg/m   Wt Readings from Last 3 Encounters:  01/04/23 133 lb (60.3 kg)  11/24/22 131 lb (59.4 kg)  06/28/22 140 lb (63.5 kg)      Physical Exam Constitutional:      General: She is not in acute distress.    Appearance: She is well-developed. She is not ill-appearing or toxic-appearing.  HENT:     Head: Normocephalic.     Right Ear: Hearing, tympanic membrane, ear canal and external ear normal. Tympanic membrane is not erythematous, retracted or bulging.     Left Ear: Hearing, tympanic membrane, ear canal and external ear normal. Tympanic membrane is not erythematous, retracted or bulging.     Nose: Mucosal edema and rhinorrhea present.     Right Sinus: No maxillary sinus tenderness or frontal sinus tenderness.     Left Sinus: No maxillary sinus tenderness or frontal sinus tenderness.     Mouth/Throat:     Pharynx: Uvula midline.  Eyes:     General: Lids are normal. Lids are everted, no foreign bodies appreciated.     Conjunctiva/sclera: Conjunctivae normal.     Pupils: Pupils are equal, round, and reactive to light.  Neck:     Thyroid: No thyroid mass or thyromegaly.     Vascular: No carotid bruit.     Trachea: Trachea normal.  Cardiovascular:     Rate and Rhythm: Normal rate and regular rhythm.     Pulses: Normal pulses.     Heart sounds: Normal heart sounds, S1 normal and S2 normal. No murmur heard.    No friction rub. No gallop.  Pulmonary:     Effort: Pulmonary effort is normal. No tachypnea or respiratory distress.     Breath sounds: Normal breath sounds. No decreased breath sounds, wheezing, rhonchi or rales.  Musculoskeletal:     Cervical back: Normal range of motion and neck supple.     Comments: No trigger point  tenderness Nontender to palpation in joints but points to multiple joints as being sore.  Lymphadenopathy:     Head:     Right side of head: No submental, submandibular, tonsillar, preauricular, posterior auricular or occipital adenopathy.     Left side of head: No submental, submandibular, tonsillar, preauricular, posterior auricular or occipital adenopathy.     Cervical: No cervical adenopathy.     Upper Body:     Right upper body: No supraclavicular adenopathy.     Left upper  body: No supraclavicular or axillary adenopathy.     Lower Body: No right inguinal adenopathy. No left inguinal adenopathy.  Skin:    General: Skin is warm and dry.     Findings: No rash.     Comments: No rash  Neurological:     Mental Status: She is alert.  Psychiatric:        Mood and Affect: Mood is not anxious. Affect is tearful.        Speech: Speech normal.        Behavior: Behavior normal. Behavior is cooperative.        Cognition and Memory: Cognition normal.        Judgment: Judgment normal.       Results for orders placed or performed in visit on 01/04/23  POC COVID-19  Result Value Ref Range   SARS Coronavirus 2 Ag Positive (A) Negative  POCT Influenza A/B  Result Value Ref Range   Influenza A, POC Negative Negative   Influenza B, POC Negative Negative    Assessment and Plan  Unclear cause of chronic symptoms but more acute symptoms in the last 24 hours are likely secondary to new COVID infection.  Will start her on Paxlovid (recommended abstinence given Paxlovid may interact with her IUD).  Negative flu testing. Push fluids, rest.  Patient requested additional testing given her level of fatigue and frustration with chronic health issues.  She cannot work is having trouble taking care of her family and is tearful in the office today. Given shortness of breath and patient concern for possible lymphoma will recheck CBC and send for chest x-ray. She states that she feels as though she has  the same symptoms as strep throat in the past.  Check ASO titer. She also wonders if sed rate and CRP for inflammation would be elevated if this is a flare of her chronic issue. Of note sed rate and CRP could be elevated just because of current ongoing COVID infection.   Pain in joint, multiple sites  Other fatigue -     C-reactive protein -     Sedimentation rate -     CBC with Differential/Platelet -     Antistreptolysin O titer -     POC COVID-19 BinaxNow -     POCT Influenza A/B  Shortness of breath -     C-reactive protein -     Sedimentation rate -     CBC with Differential/Platelet -     DG Chest 2 View; Future  COVID-19  Other orders -     nirmatrelvir/ritonavir; Take 3 tablets by mouth 2 (two) times daily for 5 days. (Take nirmatrelvir 150 mg two tablets twice daily for 5 days and ritonavir 100 mg one tablet twice daily for 5 days) Patient GFR is 114  Dispense: 30 tablet; Refill: 0    No follow-ups on file.   Eliezer Lofts, MD

## 2023-01-04 NOTE — Addendum Note (Signed)
Addended by: Eliezer Lofts E on: 01/04/2023 01:16 PM   Modules accepted: Level of Service

## 2023-01-04 NOTE — Telephone Encounter (Signed)
Can we get patient scheduled for a visit please

## 2023-01-04 NOTE — Patient Instructions (Addendum)
Start ibuprofen 800 mg every 8 hours for  pain and headache.  Complete paxlovid course.  I will call or MyChart with other results.

## 2023-01-05 ENCOUNTER — Encounter: Payer: Self-pay | Admitting: Family Medicine

## 2023-01-05 LAB — C-REACTIVE PROTEIN: CRP: <1 mg/dL (ref 0.5–20.0)

## 2023-01-05 LAB — ANTISTREPTOLYSIN O TITER: ASO: 433 IU/mL — ABNORMAL HIGH (ref <200)

## 2023-01-13 DIAGNOSIS — F4312 Post-traumatic stress disorder, chronic: Secondary | ICD-10-CM | POA: Diagnosis not present

## 2023-01-13 DIAGNOSIS — F3131 Bipolar disorder, current episode depressed, mild: Secondary | ICD-10-CM | POA: Diagnosis not present

## 2023-01-13 DIAGNOSIS — F411 Generalized anxiety disorder: Secondary | ICD-10-CM | POA: Diagnosis not present

## 2023-01-16 DIAGNOSIS — M546 Pain in thoracic spine: Secondary | ICD-10-CM | POA: Diagnosis not present

## 2023-01-16 DIAGNOSIS — M542 Cervicalgia: Secondary | ICD-10-CM | POA: Diagnosis not present

## 2023-01-16 DIAGNOSIS — M545 Low back pain, unspecified: Secondary | ICD-10-CM | POA: Diagnosis not present

## 2023-01-17 DIAGNOSIS — F4312 Post-traumatic stress disorder, chronic: Secondary | ICD-10-CM | POA: Diagnosis not present

## 2023-01-17 DIAGNOSIS — F3131 Bipolar disorder, current episode depressed, mild: Secondary | ICD-10-CM | POA: Diagnosis not present

## 2023-01-17 DIAGNOSIS — F411 Generalized anxiety disorder: Secondary | ICD-10-CM | POA: Diagnosis not present

## 2023-01-18 ENCOUNTER — Encounter: Payer: Self-pay | Admitting: *Deleted

## 2023-01-19 ENCOUNTER — Ambulatory Visit (INDEPENDENT_AMBULATORY_CARE_PROVIDER_SITE_OTHER): Payer: 59 | Admitting: Adult Health

## 2023-01-19 ENCOUNTER — Encounter: Payer: Self-pay | Admitting: Adult Health

## 2023-01-19 DIAGNOSIS — F909 Attention-deficit hyperactivity disorder, unspecified type: Secondary | ICD-10-CM

## 2023-01-19 DIAGNOSIS — F422 Mixed obsessional thoughts and acts: Secondary | ICD-10-CM | POA: Diagnosis not present

## 2023-01-19 DIAGNOSIS — F331 Major depressive disorder, recurrent, moderate: Secondary | ICD-10-CM

## 2023-01-19 DIAGNOSIS — F411 Generalized anxiety disorder: Secondary | ICD-10-CM

## 2023-01-19 NOTE — Progress Notes (Signed)
BRUCE MARTINDALE [id number] [birth year] 37 y.o.  Subjective:   Patient ID:  Cristina Davenport is a 37 y.o. (DOB [birth year]) female.  Chief Complaint: No chief complaint on file.   HPI Cristina Davenport presents to the office today for follow-up of ADHD, MDD, GAD, and obsessional thoughts and acts.   Describes mood today as "not too good". Pleasant. Mood symptoms - reports episodes of "deep" depression - feeling overwhelmed. Reports anxiety- "if I feel like I have to do something outside my normal routine". Feels irritable at times - frustrated - constant pain. Reports increased worry, rumination, and overthinking. Stating "I haven't been doing good over the past few months". Reporting increased medical issues - working with neurology. Mood is variable - depends on the situation - previous bipolar disorder diagnosis - willing to consider medication options. Has been working with a therapist. Feels like medications are working well. Improved interest and motivation. Taking medications as prescribed. Energy levels lower. Active, has a regular exercise routine - yoga. Enjoys some usual interests and activities. Married. Lives with husband. Has 3 children - dog. Family local. Spending time with family. Appetite adequate. Weight loss - 130 from 139 pounds. Sleeps well most nights. Averages 5 to 6 hours - broken sleep. Focus and concentration difficulties completing tasks. Managing aspects of household. 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 Denies SI or HI.  Denies AH or VH. Denies self harm. Denies substance use.  Previous medication trials: Zoloft, Hydroxyzine, Adderall XR '20mg'$  daily, Lamictal   AUDIT    Flowsheet Row Office Visit from 12/29/2021 in [facility] for [place] at [facility]  Alcohol Use Disorder Identification Test Final Score (AUDIT) 4      GAD-7    Flowsheet Row Office Visit from 01/04/2023 in [place] at [facility]  Visit from 12/29/2021 in [facility] [place] Visit from 10/28/2020 in [place] from 06/19/2020 in [place] from 03/06/2020 in [place]  Total GAD-7 Score '7 19 19 17 21      '$ PHQ2-9    [place] Office Visit from 01/04/2023 in [place] at [facility] Visit from 12/29/2021 in [facility] for [place] Visit from 01/19/2021 in [place] Office Visit from 10/28/2020 in [place] from 06/19/2020 in [place]  PHQ-2 Total Score '6 4 6 1 1  '$ PHQ-9 Total Score '24 19 18 7 8      '$ Flowsheet Row Admission (Discharged) from 04/06/2021 in [place] [facility] from 04/02/2021 in [place] [facility] from 03/25/2021 in [facility] at [place] No Risk No Risk Error: Question 6 not populated        Review of Systems:  Review of Systems  Musculoskeletal:  Negative for gait problem.  Neurological:  Negative for tremors.  Psychiatric/Behavioral:         Please refer to HPI    Medications: I have reviewed the patient's current medications.  Current Outpatient Medications  Medication Sig Dispense Refill   acetaminophen (TYLENOL) 500 MG tablet Take 500 mg by mouth 2 (two) times daily as needed for moderate pain.     ADDERALL XR 30 MG 24 hr capsule Take 1 capsule (30 mg total) by mouth daily. 30 capsule 0   amphetamine-dextroamphetamine (ADDERALL)  10 MG tablet Take 1 tablet (10 mg total) by mouth daily. 30 tablet 0   clindamycin (CLEOCIN T) 1 % lotion Apply topically.     clonazePAM (KLONOPIN) 0.5 MG tablet Take 1 tablet (0.5 mg total) by mouth at bedtime. 30 tablet 2   Cyanocobalamin (B-12) 5000 MCG CAPS Take 5,000 mcg by mouth daily.     levonorgestrel (LILETTA, 52  MG,) 19.5 MCG/DAY IUD IUD 1 each by Intrauterine route once.     Magnesium 400 MG TABS Take 400 mg by mouth daily.     Prucalopride Succinate (MOTEGRITY) 2 MG TABS Take 1 tablet (2 mg total) by mouth daily. 30 tablet 6   sertraline (ZOLOFT) 100 MG tablet TAKE 2 TABLETS BY MOUTH EVERY DAY 180 tablet 1   tretinoin (RETIN-A) 0.05 % cream Apply topically.     trichloroacetic acid 80 % LIQD Dispense for in office use 15 mL 0   No current facility-administered medications for this visit.    Medication Side Effects: None  Allergies:  Allergies  Allergen Reactions   Latex Other (See Comments)    General irritation   Doxycycline Rash    Solar rash    Past Medical History:  Diagnosis Date   Anxiety    Anxiety    Bipolar 1 disorder (HCC)    Depression    Family history of adverse reaction to anesthesia    GERD (gastroesophageal reflux disease)    Hypertension    PIH   IBS (irritable bowel syndrome)    Migraine without aura     Past Medical History, Surgical history, Social history, and Family history were reviewed and updated as appropriate.   Please see review of systems for further details on the patient's review from today.   Objective:   Physical Exam:  LMP 01/02/2023   Physical Exam Constitutional:      General: She is not in acute distress. Musculoskeletal:        General: No deformity.  Neurological:     Mental Status: She is alert and oriented to person, place, and time.     Coordination: Coordination normal.  Psychiatric:        Attention and Perception: Attention and perception normal. She does not perceive auditory or visual hallucinations.        Mood and Affect: Mood normal. Mood is not anxious or depressed. Affect is not labile, blunt, angry or inappropriate.        Speech: Speech normal.        Behavior: Behavior normal.        Thought Content: Thought content normal. Thought content is not paranoid or delusional. Thought content does not include homicidal  or suicidal ideation. Thought content does not include homicidal or suicidal plan.        Cognition and Memory: Cognition and memory normal.        Judgment: Judgment normal.     Comments: Insight intact     Lab Review:     Component Value Date/Time   NA 138 11/24/2022 1158   NA 137 12/29/2021 0959   K 3.8 11/24/2022 1158   CL 102 11/24/2022 1158   CO2 30 11/24/2022 1158   GLUCOSE 87 11/24/2022 1158   BUN 12 11/24/2022 1158   BUN 9 12/29/2021 0959   CREATININE 0.66 11/24/2022 1158   CALCIUM 9.1 11/24/2022 1158   PROT 7.0 11/24/2022 1158   PROT 7.2 12/29/2021 0959   ALBUMIN 4.7 11/24/2022 1158   ALBUMIN 4.9 (H) 12/29/2021  0959   AST 13 11/24/2022 1158   ALT 12 11/24/2022 1158   ALKPHOS 50 11/24/2022 1158   BILITOT 0.5 11/24/2022 1158   BILITOT 0.3 12/29/2021 0959   GFRNONAA >60 03/28/2021 1131   GFRAA >60 10/15/2019 0909       Component Value Date/Time   WBC 6.1 01/04/2023 1201   RBC 4.39 01/04/2023 1201   HGB 13.1 01/04/2023 1201   HGB 14.7 12/29/2021 0959   HGB 13.3 09/23/2015 1638   HCT 39.4 01/04/2023 1201   HCT 42.9 12/29/2021 0959   PLT 235.0 01/04/2023 1201   PLT 260 12/29/2021 0959   MCV 89.8 01/04/2023 1201   MCV 89 12/29/2021 0959   MCH 30.3 12/29/2021 0959   MCH 30.0 03/28/2021 1131   MCHC 33.2 01/04/2023 1201   RDW 13.1 01/04/2023 1201   RDW 12.4 12/29/2021 0959   LYMPHSABS 0.4 (L) 01/04/2023 1201   LYMPHSABS 2.0 05/10/2019 1445   MONOABS 0.7 01/04/2023 1201   EOSABS 0.1 01/04/2023 1201   EOSABS 0.1 05/10/2019 1445   BASOSABS 0.1 01/04/2023 1201   BASOSABS 0.0 05/10/2019 1445    No results found for: "POCLITH", "LITHIUM"   No results found for: "PHENYTOIN", "PHENOBARB", "VALPROATE", "CBMZ"   .res Assessment: Plan:    Plan:  PDMP reviewed  Adderall '10mg'$  daily - plans to take earlier in the day Adderall XR '30mg'$  daily Zoloft '200mg'$  daily Clonazepam 0.'5mg'$  daily - uses as needed   Initiate Genesight testing - mood stabilizer.  Seeing  therapist  RTC 3 months  Monitor BP between visits while taking stimulant medication.   112/69/86  Patient advised to contact office with any questions, adverse effects, or acute worsening in signs and symptoms.  Discussed potential benefits, risks, and side effects of stimulants with patient to include increased heart rate, palpitations, insomnia, increased anxiety, increased irritability, or decreased appetite.  Instructed patient to contact office if experiencing any significant tolerability issues. Diagnoses and all orders for this visit:  Major depressive disorder, recurrent episode, moderate (HCC)  Generalized anxiety disorder  Attention deficit hyperactivity disorder (ADHD), unspecified ADHD type  Mixed obsessional thoughts and acts     Please see After Visit Summary for patient specific instructions.  Future Appointments  Date Time Provider [place]  02/16/2023  1:00 PM Emmagene Ortner, Berdie Ogren, NP CP-CP None    No orders of the defined types were placed in this encounter.   -------------------------------

## 2023-01-20 DIAGNOSIS — G629 Polyneuropathy, unspecified: Secondary | ICD-10-CM | POA: Diagnosis not present

## 2023-01-20 DIAGNOSIS — G9332 Myalgic encephalomyelitis/chronic fatigue syndrome: Secondary | ICD-10-CM | POA: Diagnosis not present

## 2023-01-20 DIAGNOSIS — Z23 Encounter for immunization: Secondary | ICD-10-CM | POA: Diagnosis not present

## 2023-01-23 ENCOUNTER — Other Ambulatory Visit: Payer: Self-pay | Admitting: Adult Health

## 2023-01-23 ENCOUNTER — Telehealth: Payer: Self-pay | Admitting: Adult Health

## 2023-01-23 DIAGNOSIS — F909 Attention-deficit hyperactivity disorder, unspecified type: Secondary | ICD-10-CM

## 2023-01-23 MED ORDER — AMPHETAMINE-DEXTROAMPHETAMINE 10 MG PO TABS: 10.0000 mg | ORAL_TABLET | Freq: Every day | ORAL | 0 refills | Status: DC

## 2023-01-23 MED ORDER — ADDERALL XR 30 MG PO CP24: 30.0000 mg | ORAL_CAPSULE | Freq: Every day | ORAL | 0 refills | Status: DC

## 2023-01-23 NOTE — Telephone Encounter (Signed)
Script sent  

## 2023-01-23 NOTE — Telephone Encounter (Signed)
Pt lvm that she needs her adderall xr  30 mg and her adderall 10 mg [facility] [place] on [address] in [place]

## 2023-01-27 DIAGNOSIS — F3131 Bipolar disorder, current episode depressed, mild: Secondary | ICD-10-CM | POA: Diagnosis not present

## 2023-01-27 DIAGNOSIS — F4312 Post-traumatic stress disorder, chronic: Secondary | ICD-10-CM | POA: Diagnosis not present

## 2023-01-27 DIAGNOSIS — F411 Generalized anxiety disorder: Secondary | ICD-10-CM | POA: Diagnosis not present

## 2023-02-03 ENCOUNTER — Encounter: Payer: Self-pay | Admitting: Adult Health

## 2023-02-03 DIAGNOSIS — F3131 Bipolar disorder, current episode depressed, mild: Secondary | ICD-10-CM | POA: Diagnosis not present

## 2023-02-03 DIAGNOSIS — F411 Generalized anxiety disorder: Secondary | ICD-10-CM | POA: Diagnosis not present

## 2023-02-03 DIAGNOSIS — F4312 Post-traumatic stress disorder, chronic: Secondary | ICD-10-CM | POA: Diagnosis not present

## 2023-02-10 DIAGNOSIS — F4312 Post-traumatic stress disorder, chronic: Secondary | ICD-10-CM | POA: Diagnosis not present

## 2023-02-10 DIAGNOSIS — F411 Generalized anxiety disorder: Secondary | ICD-10-CM | POA: Diagnosis not present

## 2023-02-10 DIAGNOSIS — F3131 Bipolar disorder, current episode depressed, mild: Secondary | ICD-10-CM | POA: Diagnosis not present

## 2023-02-16 ENCOUNTER — Ambulatory Visit (INDEPENDENT_AMBULATORY_CARE_PROVIDER_SITE_OTHER): Payer: 59 | Admitting: Adult Health

## 2023-02-16 ENCOUNTER — Encounter: Payer: Self-pay | Admitting: Adult Health

## 2023-02-16 DIAGNOSIS — F411 Generalized anxiety disorder: Secondary | ICD-10-CM

## 2023-02-16 DIAGNOSIS — F909 Attention-deficit hyperactivity disorder, unspecified type: Secondary | ICD-10-CM

## 2023-02-16 DIAGNOSIS — F319 Bipolar disorder, unspecified: Secondary | ICD-10-CM | POA: Diagnosis not present

## 2023-02-16 DIAGNOSIS — F422 Mixed obsessional thoughts and acts: Secondary | ICD-10-CM | POA: Diagnosis not present

## 2023-02-16 MED ORDER — LAMOTRIGINE 25 MG PO TABS: ORAL_TABLET | ORAL | 5 refills | Status: DC

## 2023-02-16 MED ORDER — ADDERALL XR 30 MG PO CP24: 30.0000 mg | ORAL_CAPSULE | Freq: Every day | ORAL | 0 refills | Status: DC

## 2023-02-16 MED ORDER — AMPHETAMINE-DEXTROAMPHETAMINE 10 MG PO TABS: 10.0000 mg | ORAL_TABLET | Freq: Every day | ORAL | 0 refills | Status: DC

## 2023-02-16 MED ORDER — CLONAZEPAM 0.5 MG PO TABS: 0.5000 mg | ORAL_TABLET | Freq: Two times a day (BID) | ORAL | 2 refills | Status: DC | PRN

## 2023-02-16 NOTE — Progress Notes (Signed)
[name] [id number] [birth year] 37 y.o.  Subjective:   Patient ID:  Cristina Davenport is a 37 y.o. (DOB 1986-01-23) female.  Chief Complaint: No chief complaint on file.   HPI [name] presents to the office today for follow-up of ADHD, MDD, GAD, and obsessional thoughts and acts.   Describes mood today as "not too good". Pleasant. Mood symptoms - reports depression - most of the time. Reports anxiety - increased responsibilities. Reports irritability - situational - pain. Reports increased worry, rumination, and overthinking. Reports obsessive thoughts and acts - "needs things in order". Mood is variable - depends on the situation - more "down" lately. Reporting increased medical issues - "in pain all the time". Stating "I haven't been doing well". Willing to consider other options. Has taken Lamictal in the past and is willing to restart with mood instability. Working with a Transport planner. Feels like medications are working well. Varying interest and motivation. Taking medications as prescribed. Energy levels stable. Active, has a regular exercise routine. Enjoys some usual interests and activities. Married. Lives with husband. Has 3 children - dog. Family local. Spending time with family. Appetite adequate. Weight loss - 126 from 130 pounds. Sleeps well most nights. Averages 7 hours - broken sleep - up and down with children. Focus and concentration difficulties completing tasks. Managing aspects of household. 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 Denies SI or HI.  Denies AH or VH. Denies self harm. Denies substance use.  Previous medication trials: Zoloft, Hydroxyzine, Adderall XR 20mg  daily, Lamictal   AUDIT    Flowsheet Row Office Visit from 12/29/2021 in [facility] for [place] at [facility]  Alcohol Use Disorder Identification Test Final Score (AUDIT) 4      GAD-7    Flowsheet Row Office Visit from 01/04/2023 in [place] at [facility] Visit from 12/29/2021 in [facility] for [place] at [place] Visit from 10/28/2020 in [place] from 06/19/2020 in [place] from 03/06/2020 in [place]  Total GAD-7 Score 7 19 19 17 21       PHQ2-9    [place] Office Visit from 01/04/2023 in [place] at [facility] Visit from 12/29/2021 in [facility] for [place] [facility] [place] Visit from 01/19/2021 in [place] Office Visit from 10/28/2020 in [place] from 06/19/2020 in [place]  PHQ-2 Total Score 6 4 6 1 1   PHQ-9 Total Score 24 19 18 7 8       Flowsheet Row Admission (Discharged) from 04/06/2021 in [place] [facility] from 04/02/2021 in [place] ED from 03/25/2021 in [facility] Emergency Department at [place] No Risk No Risk Error: Question 6 not populated        Review of Systems:  Review of Systems  Musculoskeletal:  Negative for gait problem.  Neurological:  Negative for tremors.  Psychiatric/Behavioral:         Please refer to HPI    Medications: I have reviewed the patient's current medications.  Current Outpatient Medications  Medication Sig Dispense Refill   acetaminophen (TYLENOL) 500 MG tablet Take 500 mg by mouth 2 (two) times daily as needed for moderate pain.     ADDERALL XR 30 MG 24 hr capsule Take 1 capsule (30 mg total) by mouth daily.  30 capsule 0   amphetamine-dextroamphetamine (ADDERALL) 10 MG tablet Take 1 tablet (10 mg total) by mouth daily. 30 tablet 0   clindamycin (CLEOCIN T) 1 % lotion Apply topically.     clonazePAM (KLONOPIN) 0.5 MG tablet Take 1 tablet (0.5 mg total) by mouth at bedtime. 30 tablet 2   Cyanocobalamin (B-12) 5000 MCG CAPS Take 5,000 mcg by mouth daily.      levonorgestrel (LILETTA, 52 MG,) 19.5 MCG/DAY IUD IUD 1 each by Intrauterine route once.     Magnesium 400 MG TABS Take 400 mg by mouth daily.     Prucalopride Succinate (MOTEGRITY) 2 MG TABS Take 1 tablet (2 mg total) by mouth daily. 30 tablet 6   sertraline (ZOLOFT) 100 MG tablet TAKE 2 TABLETS BY MOUTH EVERY DAY 180 tablet 1   tretinoin (RETIN-A) 0.05 % cream Apply topically.     trichloroacetic acid 80 % LIQD Dispense for in office use 15 mL 0   No current facility-administered medications for this visit.    Medication Side Effects: None  Allergies:  Allergies  Allergen Reactions   Latex Other (See Comments)    General irritation   Doxycycline Rash    Solar rash    Past Medical History:  Diagnosis Date   Anxiety    Anxiety    Bipolar 1 disorder (HCC)    Depression    Family history of adverse reaction to anesthesia    GERD (gastroesophageal reflux disease)    Hypertension    PIH   IBS (irritable bowel syndrome)    Migraine without aura     Past Medical History, Surgical history, Social history, and Family history were reviewed and updated as appropriate.   Please see review of systems for further details on the patient's review from today.   Objective:   Physical Exam:  There were no vitals taken for this visit.  Physical Exam Constitutional:      General: She is not in acute distress. Musculoskeletal:        General: No deformity.  Neurological:     Mental Status: She is alert and oriented to person, place, and time.     Coordination: Coordination normal.  Psychiatric:        Attention and Perception: Attention and perception normal. She does not perceive auditory or visual hallucinations.        Mood and Affect: Mood normal. Mood is not anxious or depressed. Affect is not labile, blunt, angry or inappropriate.        Speech: Speech normal.        Behavior: Behavior normal.        Thought Content: Thought content normal. Thought content is not paranoid  or delusional. Thought content does not include homicidal or suicidal ideation. Thought content does not include homicidal or suicidal plan.        Cognition and Memory: Cognition and memory normal.        Judgment: Judgment normal.     Comments: Insight intact     Lab Review:     Component Value Date/Time   NA 138 11/24/2022 1158   NA 137 12/29/2021 0959   K 3.8 11/24/2022 1158   CL 102 11/24/2022 1158   CO2 30 11/24/2022 1158   GLUCOSE 87 11/24/2022 1158   BUN 12 11/24/2022 1158   BUN 9 12/29/2021 0959   CREATININE 0.66 11/24/2022 1158   CALCIUM 9.1 11/24/2022 1158   PROT 7.0 11/24/2022 1158   PROT 7.2 12/29/2021 0959  ALBUMIN 4.7 11/24/2022 1158   ALBUMIN 4.9 (H) 12/29/2021 0959   AST 13 11/24/2022 1158   ALT 12 11/24/2022 1158   ALKPHOS 50 11/24/2022 1158   BILITOT 0.5 11/24/2022 1158   BILITOT 0.3 12/29/2021 0959   GFRNONAA >60 03/28/2021 1131   GFRAA >60 10/15/2019 0909       Component Value Date/Time   WBC 6.1 01/04/2023 1201   RBC 4.39 01/04/2023 1201   HGB 13.1 01/04/2023 1201   HGB 14.7 12/29/2021 0959   HGB 13.3 09/23/2015 1638   HCT 39.4 01/04/2023 1201   HCT 42.9 12/29/2021 0959   PLT 235.0 01/04/2023 1201   PLT 260 12/29/2021 0959   MCV 89.8 01/04/2023 1201   MCV 89 12/29/2021 0959   MCH 30.3 12/29/2021 0959   MCH 30.0 03/28/2021 1131   MCHC 33.2 01/04/2023 1201   RDW 13.1 01/04/2023 1201   RDW 12.4 12/29/2021 0959   LYMPHSABS 0.4 (L) 01/04/2023 1201   LYMPHSABS 2.0 05/10/2019 1445   MONOABS 0.7 01/04/2023 1201   EOSABS 0.1 01/04/2023 1201   EOSABS 0.1 05/10/2019 1445   BASOSABS 0.1 01/04/2023 1201   BASOSABS 0.0 05/10/2019 1445    No results found for: "POCLITH", "LITHIUM"   No results found for: "PHENYTOIN", "PHENOBARB", "VALPROATE", "CBMZ"   .res Assessment: Plan:    Plan:  PDMP reviewed  Adderall 10mg  daily - plans to take earlier in the day Adderall XR 30mg  daily Zoloft 200mg  daily  Increase Clonazepam 0.5mg  daily to BID  prn - uses as needed   Add Lamictal 25mg  at hs x 14 days, then 50mg  at hs.  Initiate Genesight testing - mood stabilizer.  Seeing therapist  RTC 3 months  Monitor BP between visits while taking stimulant medication.   Patient advised to contact office with any questions, adverse effects, or acute worsening in signs and symptoms.  Discussed potential benefits, risks, and side effects of stimulants with patient to include increased heart rate, palpitations, insomnia, increased anxiety, increased irritability, or decreased appetite.  Instructed patient to contact office if experiencing any significant tolerability issues.  Discussed potential benefits, risk, and side effects of benzodiazepines to include potential risk of tolerance and dependence, as well as possible drowsiness.  Advised patient not to drive if experiencing drowsiness and to take lowest possible effective dose to minimize risk of dependence and tolerance.   Counseled patient regarding potential benefits, risks, and side effects of Lamictal to include potential risk of Stevens-Johnson syndrome. Advised patient to stop taking Lamictal and contact office immediately if rash develops and to seek urgent medical attention if rash is severe and/or spreading quickly.    Diagnoses and all orders for this visit:  Bipolar I disorder  Generalized anxiety disorder  Attention deficit hyperactivity disorder (ADHD), unspecified ADHD type  Mixed obsessional thoughts and acts     Please see After Visit Summary for patient specific instructions.  No future appointments.   No orders of the defined types were placed in this encounter.   -------------------------------

## 2023-02-17 DIAGNOSIS — F411 Generalized anxiety disorder: Secondary | ICD-10-CM | POA: Diagnosis not present

## 2023-02-17 DIAGNOSIS — F4312 Post-traumatic stress disorder, chronic: Secondary | ICD-10-CM | POA: Diagnosis not present

## 2023-02-17 DIAGNOSIS — F3131 Bipolar disorder, current episode depressed, mild: Secondary | ICD-10-CM | POA: Diagnosis not present

## 2023-02-24 DIAGNOSIS — F4312 Post-traumatic stress disorder, chronic: Secondary | ICD-10-CM | POA: Diagnosis not present

## 2023-02-24 DIAGNOSIS — F3131 Bipolar disorder, current episode depressed, mild: Secondary | ICD-10-CM | POA: Diagnosis not present

## 2023-02-24 DIAGNOSIS — F411 Generalized anxiety disorder: Secondary | ICD-10-CM | POA: Diagnosis not present

## 2023-03-03 DIAGNOSIS — F411 Generalized anxiety disorder: Secondary | ICD-10-CM | POA: Diagnosis not present

## 2023-03-03 DIAGNOSIS — F4312 Post-traumatic stress disorder, chronic: Secondary | ICD-10-CM | POA: Diagnosis not present

## 2023-03-03 DIAGNOSIS — F3131 Bipolar disorder, current episode depressed, mild: Secondary | ICD-10-CM | POA: Diagnosis not present

## 2023-03-14 DIAGNOSIS — M791 Myalgia, unspecified site: Secondary | ICD-10-CM | POA: Diagnosis not present

## 2023-03-14 DIAGNOSIS — R109 Unspecified abdominal pain: Secondary | ICD-10-CM | POA: Diagnosis not present

## 2023-03-14 DIAGNOSIS — G8929 Other chronic pain: Secondary | ICD-10-CM | POA: Diagnosis not present

## 2023-03-14 DIAGNOSIS — Z87828 Personal history of other (healed) physical injury and trauma: Secondary | ICD-10-CM | POA: Diagnosis not present

## 2023-03-14 DIAGNOSIS — R5382 Chronic fatigue, unspecified: Secondary | ICD-10-CM | POA: Diagnosis not present

## 2023-03-14 DIAGNOSIS — M2550 Pain in unspecified joint: Secondary | ICD-10-CM | POA: Diagnosis not present

## 2023-03-16 ENCOUNTER — Ambulatory Visit (INDEPENDENT_AMBULATORY_CARE_PROVIDER_SITE_OTHER): Payer: 59 | Admitting: Adult Health

## 2023-03-16 ENCOUNTER — Encounter: Payer: Self-pay | Admitting: Adult Health

## 2023-03-16 DIAGNOSIS — F422 Mixed obsessional thoughts and acts: Secondary | ICD-10-CM

## 2023-03-16 DIAGNOSIS — F909 Attention-deficit hyperactivity disorder, unspecified type: Secondary | ICD-10-CM

## 2023-03-16 DIAGNOSIS — F411 Generalized anxiety disorder: Secondary | ICD-10-CM

## 2023-03-16 DIAGNOSIS — F319 Bipolar disorder, unspecified: Secondary | ICD-10-CM

## 2023-03-16 MED ORDER — AMPHETAMINE-DEXTROAMPHETAMINE 10 MG PO TABS: 10.0000 mg | ORAL_TABLET | Freq: Every day | ORAL | 0 refills | Status: DC

## 2023-03-16 MED ORDER — ADDERALL XR 30 MG PO CP24: 30.0000 mg | ORAL_CAPSULE | Freq: Every day | ORAL | 0 refills | Status: DC

## 2023-03-16 NOTE — Progress Notes (Signed)
[name] [id number] Jul 16, 1986 37 y.o.  Subjective:   Patient ID:  Cristina Davenport is a 37 y.o. (DOB Apr 15, 1986) female.  Chief Complaint: No chief complaint on file.   HPI [name] presents to the office today for follow-up of ADHD, MDD, GAD, and obsessional thoughts and acts.   Describes mood today as "better". Pleasant. Mood symptoms - reports depression - "still most of the time - maybe handling it better". Reports decreased anxiety. Reports irritability - "more pain related". Reports decreased worry, rumination, and overthinking. Reports obsessive thoughts and acts - "needs things in order". Mood is variable - "it comes and goes - more situational". Reports ongoing medical issues - "in pain all the time". Stating "I feel better than I did". Working with a Paramedic. Feels like medications are helpful. Varying interest and motivation. Taking medications as prescribed. Energy levels lower. Active, has a regular exercise routine. Enjoys some usual interests and activities. Married. Lives with husband. Has 3 children - dog. Family local. Spending time with family. Appetite adequate. Weight loss - 126 pounds. Sleeps well most nights. Averages 7 hours - broken sleep. Focus and concentration difficulties completing tasks. Managing aspects of household. 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 Denies SI or HI.  Denies AH or VH. Denies self harm. Denies substance use.  Previous medication trials: Zoloft, Hydroxyzine, Adderall XR 20mg  daily, Lamictal   AUDIT    Flowsheet Row Office Visit from 12/29/2021 in [facility] at [facility]  Alcohol Use Disorder Identification Test Final Score (AUDIT) 4      GAD-7    Flowsheet Row Office Visit from 01/04/2023 in [facility] Visit from 12/29/2021 in [facility] for [facility] at [facility] Office Visit from 10/28/2020 in [facility] Telemedicine from 06/19/2020 in [facility] Telemedicine from 03/06/2020 in [facility]  Total GAD-7 Score 7 19 19 17 21       PHQ2-9    Flowsheet Row Office Visit from 01/04/2023 in [facility] at [facility] Visit from 12/29/2021 in [facility] at [facility] Office Visit from 01/19/2021 in [facility] Office Visit from 10/28/2020 in [facility] Telemedicine from 06/19/2020 in [facility]  PHQ-2 Total Score 6 4 6 1 1   PHQ-9 Total Score 24 19 18 7 8       Flowsheet Row Admission (Discharged) from 04/06/2021 in [facility] Pre-Admission Testing 45 from 04/02/2021 in [facility] MEDICAL/SURGICAL DAY ED from 03/25/2021 in [facility] Emergency Department at [facility]  C-SSRS RISK CATEGORY No Risk No Risk Error: Question 6 not populated        Review of Systems:  Review of Systems  Musculoskeletal:  Negative for gait problem.  Neurological:  Negative for tremors.  Psychiatric/Behavioral:         Please refer to HPI    Medications: I have reviewed the patient's current medications.  Current Outpatient Medications  Medication Sig Dispense Refill   acetaminophen (TYLENOL) 500 MG tablet Take 500 mg by mouth 2 (two) times daily as needed for moderate pain.     ADDERALL XR 30 MG 24 hr capsule Take 1 capsule (30 mg total) by mouth daily. 30 capsule 0   amphetamine-dextroamphetamine (ADDERALL) 10 MG tablet Take 1 tablet (10 mg total) by mouth daily. 30 tablet 0   clindamycin (CLEOCIN  T) 1 % lotion Apply topically.     clonazePAM (KLONOPIN) 0.5 MG tablet Take 1 tablet (0.5 mg total) by mouth 2 (two) times daily as needed for anxiety. 60 tablet 2   Cyanocobalamin (B-12) 5000 MCG CAPS Take 5,000 mcg by mouth daily.     lamoTRIgine (LAMICTAL) 25 MG tablet Take one tablet at bedtime x 14 days, then increase to two tablets at bedtime. 60 tablet  5   levonorgestrel (LILETTA, 52 MG,) 19.5 MCG/DAY IUD IUD 1 each by Intrauterine route once.     Magnesium 400 MG TABS Take 400 mg by mouth daily.     Prucalopride Succinate (MOTEGRITY) 2 MG TABS Take 1 tablet (2 mg total) by mouth daily. 30 tablet 6   sertraline (ZOLOFT) 100 MG tablet TAKE 2 TABLETS BY MOUTH EVERY DAY 180 tablet 1   tretinoin (RETIN-A) 0.05 % cream Apply topically.     trichloroacetic acid 80 % LIQD Dispense for in office use 15 mL 0   No current facility-administered medications for this visit.    Medication Side Effects: None  Allergies:  Allergies  Allergen Reactions   Latex Other (See Comments)    General irritation   Doxycycline Rash    Solar rash    Past Medical History:  Diagnosis Date   Anxiety    Anxiety    Bipolar 1 disorder (HCC)    Depression    Family history of adverse reaction to anesthesia    GERD (gastroesophageal reflux disease)    Hypertension    PIH   IBS (irritable bowel syndrome)    Migraine without aura     Past Medical History, Surgical history, Social history, and Family history were reviewed and updated as appropriate.   Please see review of systems for further details on the patient's review from today.   Objective:   Physical Exam:  There were no vitals taken for this visit.  Physical Exam Constitutional:      General: She is not in acute distress. Musculoskeletal:        General: No deformity.  Neurological:     Mental Status: She is alert and oriented to person, place, and time.     Coordination: Coordination normal.  Psychiatric:        Attention and Perception: Attention and perception normal. She does not perceive auditory or visual hallucinations.        Mood and Affect: Mood normal. Mood is not anxious or depressed. Affect is not labile, blunt, angry or inappropriate.        Speech: Speech normal.        Behavior: Behavior normal.        Thought Content: Thought content normal. Thought content is not  paranoid or delusional. Thought content does not include homicidal or suicidal ideation. Thought content does not include homicidal or suicidal plan.        Cognition and Memory: Cognition and memory normal.        Judgment: Judgment normal.     Comments: Insight intact     Lab Review:     Component Value Date/Time   NA 138 11/24/2022 1158   NA 137 12/29/2021 0959   K 3.8 11/24/2022 1158   CL 102 11/24/2022 1158   CO2 30 11/24/2022 1158   GLUCOSE 87 11/24/2022 1158   BUN 12 11/24/2022 1158   BUN 9 12/29/2021 0959   CREATININE 0.66 11/24/2022 1158   CALCIUM 9.1 11/24/2022 1158   PROT 7.0 11/24/2022 1158  PROT 7.2 12/29/2021 0959   ALBUMIN 4.7 11/24/2022 1158   ALBUMIN 4.9 (H) 12/29/2021 0959   AST 13 11/24/2022 1158   ALT 12 11/24/2022 1158   ALKPHOS 50 11/24/2022 1158   BILITOT 0.5 11/24/2022 1158   BILITOT 0.3 12/29/2021 0959   GFRNONAA >60 03/28/2021 1131   GFRAA >60 10/15/2019 0909       Component Value Date/Time   WBC 6.1 01/04/2023 1201   RBC 4.39 01/04/2023 1201   HGB 13.1 01/04/2023 1201   HGB 14.7 12/29/2021 0959   HGB 13.3 09/23/2015 1638   HCT 39.4 01/04/2023 1201   HCT 42.9 12/29/2021 0959   PLT 235.0 01/04/2023 1201   PLT 260 12/29/2021 0959   MCV 89.8 01/04/2023 1201   MCV 89 12/29/2021 0959   MCH 30.3 12/29/2021 0959   MCH 30.0 03/28/2021 1131   MCHC 33.2 01/04/2023 1201   RDW 13.1 01/04/2023 1201   RDW 12.4 12/29/2021 0959   LYMPHSABS 0.4 (L) 01/04/2023 1201   LYMPHSABS 2.0 05/10/2019 1445   MONOABS 0.7 01/04/2023 1201   EOSABS 0.1 01/04/2023 1201   EOSABS 0.1 05/10/2019 1445   BASOSABS 0.1 01/04/2023 1201   BASOSABS 0.0 05/10/2019 1445    No results found for: "POCLITH", "LITHIUM"   No results found for: "PHENYTOIN", "PHENOBARB", "VALPROATE", "CBMZ"   .res Assessment: Plan:    Plan:  PDMP reviewed  Adderall 10mg  daily - plans to take earlier in the day Adderall XR 30mg  daily Zoloft 200mg  daily  Clonazepam 0.5mg  daily to BID  prn - uses as needed   Add Lamictal 25mg  at hs x 14 days, then 50mg  at hs - plans   Initiate Genesight testing - mood stabilizer.  Seeing therapist  RTC 3 months  Monitor BP between visits while taking stimulant medication.   Patient advised to contact office with any questions, adverse effects, or acute worsening in signs and symptoms.  Discussed potential benefits, risks, and side effects of stimulants with patient to include increased heart rate, palpitations, insomnia, increased anxiety, increased irritability, or decreased appetite.  Instructed patient to contact office if experiencing any significant tolerability issues.  Discussed potential benefits, risk, and side effects of benzodiazepines to include potential risk of tolerance and dependence, as well as possible drowsiness.  Advised patient not to drive if experiencing drowsiness and to take lowest possible effective dose to minimize risk of dependence and tolerance.   Counseled patient regarding potential benefits, risks, and side effects of Lamictal to include potential risk of Stevens-Johnson syndrome. Advised patient to stop taking Lamictal and contact office immediately if rash develops and to seek urgent medical attention if rash is severe and/or spreading quickly.    Diagnoses and all orders for this visit:  Bipolar I disorder (HCC)  Attention deficit hyperactivity disorder (ADHD), unspecified ADHD type -     ADDERALL XR 30 MG 24 hr capsule; Take 1 capsule (30 mg total) by mouth daily. -     amphetamine-dextroamphetamine (ADDERALL) 10 MG tablet; Take 1 tablet (10 mg total) by mouth daily.  Generalized anxiety disorder  Mixed obsessional thoughts and acts     Please see After Visit Summary for patient specific instructions.  No future appointments.   No orders of the defined types were placed in this encounter.   -------------------------------

## 2023-04-18 ENCOUNTER — Ambulatory Visit: Payer: 59 | Admitting: Women's Health

## 2023-04-20 ENCOUNTER — Other Ambulatory Visit: Payer: Self-pay | Admitting: Gastroenterology

## 2023-04-21 ENCOUNTER — Other Ambulatory Visit: Payer: Self-pay | Admitting: Gastroenterology

## 2023-04-27 ENCOUNTER — Telehealth: Payer: Self-pay | Admitting: Adult Health

## 2023-04-27 ENCOUNTER — Other Ambulatory Visit: Payer: Self-pay | Admitting: Adult Health

## 2023-04-27 DIAGNOSIS — F909 Attention-deficit hyperactivity disorder, unspecified type: Secondary | ICD-10-CM

## 2023-04-27 MED ORDER — AMPHETAMINE-DEXTROAMPHETAMINE 10 MG PO TABS: 10.0000 mg | ORAL_TABLET | Freq: Every day | ORAL | 0 refills | Status: DC

## 2023-04-27 NOTE — Telephone Encounter (Signed)
Pt LVM @ 12:20p requesting refill of generic Adderall 10mg  to   [id number] [address] [place] [address] Phone: [phone]  Fax: [phone]   No upcoming appt scheduled.

## 2023-04-27 NOTE — Telephone Encounter (Signed)
Script sent  

## 2023-04-28 ENCOUNTER — Other Ambulatory Visit: Payer: Self-pay | Admitting: Adult Health

## 2023-04-28 ENCOUNTER — Telehealth: Payer: Self-pay | Admitting: Adult Health

## 2023-04-28 DIAGNOSIS — F909 Attention-deficit hyperactivity disorder, unspecified type: Secondary | ICD-10-CM

## 2023-04-28 MED ORDER — ADDERALL XR 30 MG PO CP24: 30.0000 mg | ORAL_CAPSULE | Freq: Every day | ORAL | 0 refills | Status: DC

## 2023-04-28 NOTE — Telephone Encounter (Signed)
Pt lvm that she needs her adderall xr 30 mg. [facility] [place] [address] in [place].

## 2023-04-28 NOTE — Telephone Encounter (Signed)
Script sent  

## 2023-05-04 ENCOUNTER — Telehealth: Payer: Self-pay | Admitting: Adult Health

## 2023-05-04 ENCOUNTER — Other Ambulatory Visit: Payer: Self-pay | Admitting: Adult Health

## 2023-05-04 DIAGNOSIS — F909 Attention-deficit hyperactivity disorder, unspecified type: Secondary | ICD-10-CM

## 2023-05-04 MED ORDER — AMPHETAMINE-DEXTROAMPHETAMINE 10 MG PO TABS: 10.0000 mg | ORAL_TABLET | Freq: Every day | ORAL | 0 refills | Status: DC

## 2023-05-04 NOTE — Telephone Encounter (Signed)
Pt lvm that she received amphetamine salts and she wanted detroamphetamine. She wants to know how she can trade for the correct medication. She said the salts are not working. Please call her at [phone]

## 2023-05-04 NOTE — Telephone Encounter (Signed)
Called and spoke with patient.

## 2023-05-22 ENCOUNTER — Ambulatory Visit: Payer: Medicaid Other | Admitting: Women's Health

## 2023-05-29 ENCOUNTER — Other Ambulatory Visit: Payer: Self-pay

## 2023-05-29 ENCOUNTER — Telehealth: Payer: Self-pay | Admitting: Adult Health

## 2023-05-29 DIAGNOSIS — F909 Attention-deficit hyperactivity disorder, unspecified type: Secondary | ICD-10-CM

## 2023-05-29 MED ORDER — ADDERALL XR 30 MG PO CP24: 30.0000 mg | ORAL_CAPSULE | Freq: Every day | ORAL | 0 refills | Status: DC

## 2023-05-29 NOTE — Telephone Encounter (Signed)
Pt  called and said that she needs refills on both of her adderall 10 mg and adderall xr 30 mg/. [facility] [address] [facility] [place] [place] [facility] [address]

## 2023-05-29 NOTE — Telephone Encounter (Signed)
Patient has a RF for 10 mg dosing that has been filled and ready to pick up. Pended XR 30 mg

## 2023-05-29 NOTE — Telephone Encounter (Signed)
Please call to schedule F/U.  ?

## 2023-05-30 NOTE — Telephone Encounter (Signed)
Lvm for pt to call and schedule 

## 2023-06-12 ENCOUNTER — Ambulatory Visit (INDEPENDENT_AMBULATORY_CARE_PROVIDER_SITE_OTHER): Payer: 59 | Admitting: Obstetrics & Gynecology

## 2023-06-12 ENCOUNTER — Encounter: Payer: Self-pay | Admitting: Obstetrics & Gynecology

## 2023-06-12 VITALS — BP 121/81 | HR 83 | Ht 61.0 in | Wt 126.0 lb

## 2023-06-12 DIAGNOSIS — Z01419 Encounter for gynecological examination (general) (routine) without abnormal findings: Secondary | ICD-10-CM | POA: Diagnosis not present

## 2023-06-12 DIAGNOSIS — N946 Dysmenorrhea, unspecified: Secondary | ICD-10-CM | POA: Diagnosis not present

## 2023-06-12 DIAGNOSIS — N920 Excessive and frequent menstruation with regular cycle: Secondary | ICD-10-CM | POA: Diagnosis not present

## 2023-06-12 DIAGNOSIS — N941 Unspecified dyspareunia: Secondary | ICD-10-CM | POA: Diagnosis not present

## 2023-06-12 DIAGNOSIS — N8003 Adenomyosis of the uterus: Secondary | ICD-10-CM

## 2023-06-12 MED ORDER — DESOGESTREL-ETHINYL ESTRADIOL 0.15-30 MG-MCG PO TABS: 1.0000 | ORAL_TABLET | Freq: Every day | ORAL | 11 refills | Status: DC

## 2023-06-12 MED ORDER — SILVER SULFADIAZINE 1 % EX CREA: TOPICAL_CREAM | CUTANEOUS | 11 refills | Status: AC

## 2023-06-12 NOTE — Progress Notes (Addendum)
Subjective:     Cristina Davenport is a 37 y.o. female here for a routine exam.  Patient's last menstrual period was 06/11/2023. [id number] Birth Control Method: Liletta IUD Menstrual Calendar(currently): regular 7-9 days heavy very painful/crampy  Current complaints: >50%.   Current acute medical issues:  none   Recent Gynecologic History Patient's last menstrual period was 06/11/2023. Last Pap: 2023,  normal Last mammogram: na,    Past Medical History:  Diagnosis Date   Anxiety    Anxiety    Bipolar 1 disorder (HCC)    Depression    Family history of adverse reaction to anesthesia    GERD (gastroesophageal reflux disease)    Hypertension    PIH   IBS (irritable bowel syndrome)    Migraine without aura     Past Surgical History:  Procedure Laterality Date   ABDOMINAL SURGERY     cyst removed    CHOLECYSTECTOMY N/A 07/10/2013   Procedure: LAPAROSCOPIC CHOLECYSTECTOMY;  Surgeon: Fabio Bering, MD;  Location: [facility];  Service: General;  Laterality: N/A;   ESOPHAGOGASTRODUODENOSCOPY N/A 04/30/2013   [id number] distal esophagus of uncertain clinical significance-status post biopsy. Tiny hiatal hernia. No explanation   EXAMINATION UNDER ANESTHESIA  04/21/2012   Gluteal wound repair   INCISIONAL HERNIA REPAIR N/A 04/06/2021   Procedure: INCISIONAL HERNIORRHAPHY;  Surgeon: Franky Macho, MD;  Location: [facility];  Service: General;  Laterality: N/A;   WISDOM TOOTH EXTRACTION      OB History     Gravida  4   Para  3   Term  2   Preterm  1   AB  1   Living  3      SAB  1   IAB  0   Ectopic  0   Multiple  0   Live Births  3           Social History   Socioeconomic History   Marital status: Married    Spouse name: Elayne Snare   Number of children: 3   Years of education: 12   Highest education level: Some college, no degree  Occupational History   Occupation: Magazine features editor   Occupation: realtor  Tobacco Use   Smoking status: Former    Current  packs/day: 0.00    Average packs/day: 0.5 packs/day for 8.0 years (4.0 ttl pk-yrs)    Types: Cigarettes    Start date: 09/19/2006    Quit date: 09/19/2014    Years since quitting: 8.7   Smokeless tobacco: Never  Vaping Use   Vaping status: Every Day   Substances: Nicotine, Flavoring  Substance and Sexual Activity   Alcohol use: Yes    Alcohol/week: 3.0 standard drinks of alcohol    Types: 3 Glasses of wine per week    Comment: scoially   Drug use: Yes    Types: Marijuana    Comment: for pain in the back   Sexual activity: Yes    Partners: Male    Birth control/protection: I.U.D.  Other Topics Concern   Not on file  Social History Narrative   Married since July 2020,been together for 10 years.Lives with husband and kids.   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   Social Determinants of Health   Financial Resource Strain: Low Risk  (12/29/2021)   Overall Financial Resource Strain (CARDIA)    Difficulty of Paying Living Expenses: Not hard at all  Food Insecurity: No Food Insecurity (12/29/2021)   Hunger Vital  Sign    Worried About Programme researcher, broadcasting/film/video in the Last Year: Never true    Ran Out of Food in the Last Year: Never true  Transportation Needs: No Transportation Needs (12/29/2021)   PRAPARE - Administrator, Civil Service (Medical): No    Lack of Transportation (Non-Medical): No  Physical Activity: Sufficiently Active (12/29/2021)   Exercise Vital Sign    Days of Exercise per Week: 5 days    Minutes of Exercise per Session: 40 min  Stress: Stress Concern Present (12/29/2021)   [employer] of Occupational Health - Occupational Stress Questionnaire    Feeling of Stress : Very much  Social Connections: Unknown (12/29/2021)   Social Connection and Isolation Panel [NHANES]    Frequency of Communication with Friends and Family: More than three times a week    Frequency of Social Gatherings with Friends and Family: Once a week    Attends  Religious Services: Patient declined    Database administrator or Organizations: No    Attends Engineer, structural: More than 4 times per year    Marital Status: Married    Family History  Adopted: Yes  Problem Relation Age of Onset   Mental illness Mother    Cancer Father        skin cancer   Hypertension Maternal Grandmother    Stroke Maternal Grandmother    Kidney failure Maternal Grandmother    Hypertension Maternal Grandfather    Stroke Maternal Grandfather    Cancer Maternal Grandfather        skin   Crohn's disease Cousin    Colon cancer Neg Hx    Rectal cancer Neg Hx    Stomach cancer Neg Hx      Current Outpatient Medications:    acetaminophen (TYLENOL) 500 MG tablet, Take 500 mg by mouth 2 (two) times daily as needed for moderate pain., Disp: , Rfl:    ADDERALL XR 30 MG 24 hr capsule, Take 1 capsule (30 mg total) by mouth daily., Disp: 30 capsule, Rfl: 0   amphetamine-dextroamphetamine (ADDERALL) 10 MG tablet, Take 1 tablet (10 mg total) by mouth daily., Disp: 30 tablet, Rfl: 0   clindamycin (CLEOCIN T) 1 % lotion, Apply topically., Disp: , Rfl:    clonazePAM (KLONOPIN) 0.5 MG tablet, Take 1 tablet (0.5 mg total) by mouth 2 (two) times daily as needed for anxiety., Disp: 60 tablet, Rfl: 2   Cyanocobalamin (B-12) 5000 MCG CAPS, Take 5,000 mcg by mouth daily., Disp: , Rfl:    desogestrel-ethinyl estradiol (APRI) 0.15-30 MG-MCG tablet, Take 1 tablet by mouth daily., Disp: 28 tablet, Rfl: 11   lamoTRIgine (LAMICTAL) 25 MG tablet, Take one tablet at bedtime x 14 days, then increase to two tablets at bedtime., Disp: 60 tablet, Rfl: 5   levonorgestrel (LILETTA, 52 MG,) 19.5 MCG/DAY IUD IUD, 1 each by Intrauterine route once., Disp: , Rfl:    Magnesium 400 MG TABS, Take 400 mg by mouth daily., Disp: , Rfl:    sertraline (ZOLOFT) 100 MG tablet, TAKE 2 TABLETS BY MOUTH EVERY DAY, Disp: 180 tablet, Rfl: 1   silver sulfADIAZINE (SILVADENE) 1 % cream, Apply to area 3  times daily, Disp: 50 g, Rfl: 11   tretinoin (RETIN-A) 0.05 % cream, Apply topically., Disp: , Rfl:    amphetamine-dextroamphetamine (ADDERALL) 10 MG tablet, Take 1 tablet (10 mg total) by mouth daily with breakfast. (Patient not taking: Reported on 06/12/2023), Disp: 30 tablet, Rfl: 0  Prucalopride Succinate (MOTEGRITY) 2 MG TABS, TAKE 1 TABLET BY MOUTH EVERY DAY, Disp: 90 tablet, Rfl: 0   trichloroacetic acid 80 % LIQD, Dispense for in office use (Patient not taking: Reported on 06/12/2023), Disp: 15 mL, Rfl: 0  Review of Systems  Review of Systems  Constitutional: Negative for fever, chills, weight loss, malaise/fatigue and diaphoresis.  HENT: Negative for hearing loss, ear pain, nosebleeds, congestion, sore throat, neck pain, tinnitus and ear discharge.   Eyes: Negative for blurred vision, double vision, photophobia, pain, discharge and redness.  Respiratory: Negative for cough, hemoptysis, sputum production, shortness of breath, wheezing and stridor.   Cardiovascular: Negative for chest pain, palpitations, orthopnea, claudication, leg swelling and PND.  Gastrointestinal: negative for abdominal pain. Negative for heartburn, nausea, vomiting, diarrhea, constipation, blood in stool and melena.  Genitourinary: Negative for dysuria, urgency, frequency, hematuria and flank pain.  Musculoskeletal: Negative for myalgias, back pain, joint pain and falls.  Skin: Negative for itching and rash.  Neurological: Negative for dizziness, tingling, tremors, sensory change, speech change, focal weakness, seizures, loss of consciousness, weakness and headaches.  Endo/Heme/Allergies: Negative for environmental allergies and polydipsia. Does not bruise/bleed easily.  Psychiatric/Behavioral: Negative for depression, suicidal ideas, hallucinations, memory loss and substance abuse. The patient is not nervous/anxious and does not have insomnia.        Objective:  Blood pressure 121/81, pulse 83, height 5\' 1"   (1.549 m), weight 126 lb (57.2 kg), last menstrual period 06/11/2023.   Physical Exam  Vitals reviewed. Constitutional: She is oriented to person, place, and time. She appears well-developed and well-nourished.  HENT:  Head: Normocephalic and atraumatic.        Right Ear: External ear normal.  Left Ear: External ear normal.  Nose: Nose normal.  Mouth/Throat: Oropharynx is clear and moist.  Eyes: Conjunctivae and EOM are normal. Pupils are equal, round, and reactive to light. Right eye exhibits no discharge. Left eye exhibits no discharge. No scleral icterus.  Neck: Normal range of motion. Neck supple. No tracheal deviation present. No thyromegaly present.  Cardiovascular: Normal rate, regular rhythm, normal heart sounds and intact distal pulses.  Exam reveals no gallop and no friction rub.   No murmur heard. Respiratory: Effort normal and breath sounds normal. No respiratory distress. She has no wheezes. She has no rales. She exhibits no tenderness.  GI: Soft. Bowel sounds are normal. She exhibits no distension and no mass. There is no tenderness. There is no rebound and no guarding.  Genitourinary:  Breasts no masses skin changes or nipple changes bilaterally      Vulva is patch of condyloma on the left vulva Vagina is pink moist without discharge Cervix normal in appearance and pap is not done Uterus is normal size shape and contour retroverted soft mucous  Adnexa is negative with normal sized ovaries   Musculoskeletal: Normal range of motion. She exhibits no edema and no tenderness.  Neurological: She is alert and oriented to person, place, and time. She has normal reflexes. She displays normal reflexes. No cranial nerve deficit. She exhibits normal muscle tone. Coordination normal.  Skin: Skin is warm and dry. No rash noted. No erythema. No pallor.  Psychiatric: She has a normal mood and affect. Her behavior is normal. Judgment and thought content normal.       Medications Ordered  at today's visit: Meds ordered this encounter  Medications   silver sulfADIAZINE (SILVADENE) 1 % cream    Sig: Apply to area 3 times daily  Dispense:  50 g    Refill:  11   desogestrel-ethinyl estradiol (APRI) 0.15-30 MG-MCG tablet    Sig: Take 1 tablet by mouth daily.    Dispense:  28 tablet    Refill:  11   TCA sent into [facility]  Other orders placed at today's visit: No orders of the defined types were placed in this encounter.     Assessment:    Normal Gyn exam.   Dyspareunia Probable adenomyosis Menorrhagia  dysmenorrhea Plan:    GYN sonogram to evaluate uterine size     Return for GYN sono, Follow up, with Dr Despina Hidden after sonogram.

## 2023-06-13 DIAGNOSIS — R5382 Chronic fatigue, unspecified: Secondary | ICD-10-CM | POA: Diagnosis not present

## 2023-06-13 DIAGNOSIS — M791 Myalgia, unspecified site: Secondary | ICD-10-CM | POA: Diagnosis not present

## 2023-06-13 DIAGNOSIS — M2550 Pain in unspecified joint: Secondary | ICD-10-CM | POA: Diagnosis not present

## 2023-06-29 ENCOUNTER — Other Ambulatory Visit: Payer: Self-pay | Admitting: Adult Health

## 2023-06-29 DIAGNOSIS — F411 Generalized anxiety disorder: Secondary | ICD-10-CM

## 2023-06-30 ENCOUNTER — Telehealth: Payer: Self-pay | Admitting: Adult Health

## 2023-06-30 NOTE — Telephone Encounter (Signed)
MyChart message sent to schedule an appt. Admin also called patient.

## 2023-06-30 NOTE — Telephone Encounter (Signed)
Followup needed. Sent MyChart message.  Also received a pharmacy refill request.

## 2023-06-30 NOTE — Telephone Encounter (Signed)
Pt LVM @ 8:11a requesting refill of Adderall 10mg  and Adderall 30mg  XR [facility]  [id number] [address], [place] [address] Phone: [phone]  Fax: [phone]   No upcoming appt scheduled.  I LVM @ 11:15a to call back and schedule appt

## 2023-07-03 ENCOUNTER — Other Ambulatory Visit: Payer: Self-pay

## 2023-07-03 DIAGNOSIS — F909 Attention-deficit hyperactivity disorder, unspecified type: Secondary | ICD-10-CM

## 2023-07-03 MED ORDER — AMPHETAMINE-DEXTROAMPHETAMINE 10 MG PO TABS: 10.0000 mg | ORAL_TABLET | Freq: Every day | ORAL | 0 refills | Status: DC

## 2023-07-03 NOTE — Telephone Encounter (Signed)
Filled 30 XR on 8/17.

## 2023-07-03 NOTE — Telephone Encounter (Signed)
Pt has appt 8/20; pls send scripts in.

## 2023-07-03 NOTE — Telephone Encounter (Signed)
Pended.

## 2023-07-04 ENCOUNTER — Encounter: Payer: Self-pay | Admitting: Adult Health

## 2023-07-04 ENCOUNTER — Ambulatory Visit (INDEPENDENT_AMBULATORY_CARE_PROVIDER_SITE_OTHER): Payer: 59 | Admitting: Adult Health

## 2023-07-04 DIAGNOSIS — F411 Generalized anxiety disorder: Secondary | ICD-10-CM | POA: Diagnosis not present

## 2023-07-04 DIAGNOSIS — F909 Attention-deficit hyperactivity disorder, unspecified type: Secondary | ICD-10-CM

## 2023-07-04 DIAGNOSIS — F331 Major depressive disorder, recurrent, moderate: Secondary | ICD-10-CM

## 2023-07-04 DIAGNOSIS — F319 Bipolar disorder, unspecified: Secondary | ICD-10-CM

## 2023-07-04 MED ORDER — ADDERALL XR 30 MG PO CP24: 30.0000 mg | ORAL_CAPSULE | Freq: Every day | ORAL | 0 refills | Status: DC

## 2023-07-04 MED ORDER — AMPHETAMINE-DEXTROAMPHETAMINE 10 MG PO TABS: 10.0000 mg | ORAL_TABLET | Freq: Every day | ORAL | 0 refills | Status: DC

## 2023-07-04 MED ORDER — LAMOTRIGINE 100 MG PO TABS: ORAL_TABLET | ORAL | 5 refills | Status: DC

## 2023-07-04 MED ORDER — SERTRALINE HCL 100 MG PO TABS: ORAL_TABLET | ORAL | 1 refills | Status: DC

## 2023-07-04 MED ORDER — CLONAZEPAM 0.5 MG PO TABS: 0.5000 mg | ORAL_TABLET | Freq: Two times a day (BID) | ORAL | 2 refills | Status: DC | PRN

## 2023-07-04 NOTE — Progress Notes (Signed)
Cristina Davenport [id number] Oct 26, 1986 37 y.o.  Subjective:   Patient ID:  Cristina Davenport is a 37 y.o. (DOB 12-May-1986) female.  Chief Complaint: No chief complaint on file.   HPI Cristina Davenport presents to the office today for follow-up of ADHD, MDD, GAD, and obsessional thoughts and acts.   Describes mood today as "better". Pleasant. Mood symptoms - reports depression - "it comes ad goes". Reports  anxiety when overwhelmed. Denies irritability. Reports some worry, rumination, and overthinking. Reports decreased obsessive thoughts and acts. Mood is more stable. Reports ongoing medical issues - back pain.  Stating "I feel a lot better". Feels like medications are helpful. Varying interest and motivation. Taking medications as prescribed. Energy levels lower. Active, has a regular exercise routine. Enjoys some usual interests and activities. Married. Lives with husband. Has 3 children - dog. Family local. Spending time with family. Appetite adequate. Weight loss - 126 pounds. Sleeps better some nights than others. Reports broken sleep. Focus and concentration difficulties. Completing tasks. Managing aspects of household. Works in Research officer, political party. Denies SI or HI.  Denies AH or VH. Denies self harm. Denies substance use.  Previous medication trials: Zoloft, Hydroxyzine, Adderall XR 20mg  daily, Lamictal    AUDIT    Flowsheet Row Office Visit from 12/29/2021 in [facility]  Alcohol Use Disorder Identification Test Final Score (AUDIT) 4      GAD-7    Flowsheet Row Office Visit from 01/04/2023 in [facility] Visit from 12/29/2021 in [facility] Office Visit from 10/28/2020 in [facility] from 06/19/2020 in [facility] from 03/06/2020 in [facility]  Total GAD-7 Score 7 19 19 17 21       PHQ2-9     Flowsheet Row Office Visit from 01/04/2023 in [facility] Visit from 12/29/2021 in [facility] for [facility] at [facility] Office Visit from 01/19/2021 in [facility] Office Visit from 10/28/2020 in [facility] from 06/19/2020 in [facility]  PHQ-2 Total Score 6 4 6 1 1   PHQ-9 Total Score 24 19 18 7 8       Flowsheet Row Admission (Discharged) from 04/06/2021 in [facility] Pre-Admission Testing 45 from 04/02/2021 in [facility] MEDICAL/SURGICAL DAY ED from 03/25/2021 in [facility] at [facility]  C-SSRS RISK CATEGORY No Risk No Risk Error: Question 6 not populated        Review of Systems:  Review of Systems  Musculoskeletal:  Negative for gait problem.  Neurological:  Negative for tremors.  Psychiatric/Behavioral:         Please refer to HPI    Medications: I have reviewed the patient's current medications.  Current Outpatient Medications  Medication Sig Dispense Refill   [START ON 08/01/2023] ADDERALL XR 30 MG 24 hr capsule Take 1 capsule (30 mg total) by mouth daily. 30 capsule 0   [START ON 08/29/2023] ADDERALL XR 30 MG 24 hr capsule Take 1 capsule (30 mg total) by mouth daily. 30 capsule 0   [START ON 08/01/2023] amphetamine-dextroamphetamine (ADDERALL) 10 MG tablet Take 1 tablet (10 mg total) by mouth daily at 12 noon. 30 tablet 0   [START ON 08/29/2023] amphetamine-dextroamphetamine (ADDERALL) 10 MG tablet Take 1 tablet (10 mg total) by mouth daily at 12 noon. 30 tablet 0   acetaminophen (  TYLENOL) 500 MG tablet Take 500 mg by mouth 2 (two) times daily as needed for moderate pain.     ADDERALL XR 30 MG 24 hr capsule Take 1 capsule (30 mg total) by mouth daily. 30 capsule 0   amphetamine-dextroamphetamine (ADDERALL) 10 MG tablet Take 1 tablet (10 mg total) by mouth daily with breakfast. (Patient not taking: Reported on 06/12/2023) 30  tablet 0   amphetamine-dextroamphetamine (ADDERALL) 10 MG tablet Take 1 tablet (10 mg total) by mouth daily. 30 tablet 0   clindamycin (CLEOCIN T) 1 % lotion Apply topically.     clonazePAM (KLONOPIN) 0.5 MG tablet Take 1 tablet (0.5 mg total) by mouth 2 (two) times daily as needed for anxiety. 60 tablet 2   Cyanocobalamin (B-12) 5000 MCG CAPS Take 5,000 mcg by mouth daily.     desogestrel-ethinyl estradiol (APRI) 0.15-30 MG-MCG tablet Take 1 tablet by mouth daily. 28 tablet 11   lamoTRIgine (LAMICTAL) 100 MG tablet Take one tablet at bedtime. 30 tablet 5   levonorgestrel (LILETTA, 52 MG,) 19.5 MCG/DAY IUD IUD 1 each by Intrauterine route once.     Magnesium 400 MG TABS Take 400 mg by mouth daily.     Prucalopride Succinate (MOTEGRITY) 2 MG TABS TAKE 1 TABLET BY MOUTH EVERY DAY 90 tablet 0   sertraline (ZOLOFT) 100 MG tablet TAKE 2 TABLETS BY MOUTH EVERY DAY 180 tablet 1   silver sulfADIAZINE (SILVADENE) 1 % cream Apply to area 3 times daily 50 g 11   tretinoin (RETIN-A) 0.05 % cream Apply topically.     trichloroacetic acid 80 % LIQD Dispense for in office use (Patient not taking: Reported on 06/12/2023) 15 mL 0   No current facility-administered medications for this visit.    Medication Side Effects: None  Allergies:  Allergies  Allergen Reactions   Latex Other (See Comments)    General irritation   Doxycycline Rash    Solar rash    Past Medical History:  Diagnosis Date   Anxiety    Anxiety    Bipolar 1 disorder (HCC)    Depression    Family history of adverse reaction to anesthesia    GERD (gastroesophageal reflux disease)    Hypertension    PIH   IBS (irritable bowel syndrome)    Migraine without aura     Past Medical History, Surgical history, Social history, and Family history were reviewed and updated as appropriate.   Please see review of systems for further details on the patient's review from today.   Objective:   Physical Exam:  LMP 06/11/2023   Physical  Exam Constitutional:      General: She is not in acute distress. Musculoskeletal:        General: No deformity.  Neurological:     Mental Status: She is alert and oriented to person, place, and time.     Coordination: Coordination normal.  Psychiatric:        Attention and Perception: Attention and perception normal. She does not perceive auditory or visual hallucinations.        Mood and Affect: Affect is not labile, blunt, angry or inappropriate.        Speech: Speech normal.        Behavior: Behavior normal.        Thought Content: Thought content normal. Thought content is not paranoid or delusional. Thought content does not include homicidal or suicidal ideation. Thought content does not include homicidal or suicidal plan.  Cognition and Memory: Cognition and memory normal.        Judgment: Judgment normal.     Comments: Insight intact     Lab Review:     Component Value Date/Time   NA 138 11/24/2022 1158   NA 137 12/29/2021 0959   K 3.8 11/24/2022 1158   CL 102 11/24/2022 1158   CO2 30 11/24/2022 1158   GLUCOSE 87 11/24/2022 1158   BUN 12 11/24/2022 1158   BUN 9 12/29/2021 0959   CREATININE 0.66 11/24/2022 1158   CALCIUM 9.1 11/24/2022 1158   PROT 7.0 11/24/2022 1158   PROT 7.2 12/29/2021 0959   ALBUMIN 4.7 11/24/2022 1158   ALBUMIN 4.9 (H) 12/29/2021 0959   AST 13 11/24/2022 1158   ALT 12 11/24/2022 1158   ALKPHOS 50 11/24/2022 1158   BILITOT 0.5 11/24/2022 1158   BILITOT 0.3 12/29/2021 0959   GFRNONAA >60 03/28/2021 1131   GFRAA >60 10/15/2019 0909       Component Value Date/Time   WBC 6.1 01/04/2023 1201   RBC 4.39 01/04/2023 1201   HGB 13.1 01/04/2023 1201   HGB 14.7 12/29/2021 0959   HGB 13.3 09/23/2015 1638   HCT 39.4 01/04/2023 1201   HCT 42.9 12/29/2021 0959   PLT 235.0 01/04/2023 1201   PLT 260 12/29/2021 0959   MCV 89.8 01/04/2023 1201   MCV 89 12/29/2021 0959   MCH 30.3 12/29/2021 0959   MCH 30.0 03/28/2021 1131   MCHC 33.2  01/04/2023 1201   RDW 13.1 01/04/2023 1201   RDW 12.4 12/29/2021 0959   LYMPHSABS 0.4 (L) 01/04/2023 1201   LYMPHSABS 2.0 05/10/2019 1445   MONOABS 0.7 01/04/2023 1201   EOSABS 0.1 01/04/2023 1201   EOSABS 0.1 05/10/2019 1445   BASOSABS 0.1 01/04/2023 1201   BASOSABS 0.0 05/10/2019 1445    No results found for: "POCLITH", "LITHIUM"   No results found for: "PHENYTOIN", "PHENOBARB", "VALPROATE", "CBMZ"   .res Assessment: Plan:    Plan:  PDMP reviewed  Adderall 10mg  daily - plans to take earlier in the day Adderall XR 30mg  daily Zoloft 200mg  daily  Clonazepam 0.5mg  daily to BID prn - uses as needed   Increase Lamictal 75mg  to 100mg  daily  Initiate Genesight testing - mood stabilizer.  Seeing therapist  RTC 3 months  Monitor BP between visits while taking stimulant medication.   Patient advised to contact office with any questions, adverse effects, or acute worsening in signs and symptoms.  Discussed potential benefits, risks, and side effects of stimulants with patient to include increased heart rate, palpitations, insomnia, increased anxiety, increased irritability, or decreased appetite. Instructed patient to contact office if experiencing any significant tolerability issues.  Discussed potential benefits, risk, and side effects of benzodiazepines to include potential risk of tolerance and dependence, as well as possible drowsiness. Advised patient not to drive if experiencing drowsiness and to take lowest possible effective dose to minimize risk of dependence and tolerance.   Counseled patient regarding potential benefits, risks, and side effects of Lamictal to include potential risk of Stevens-Johnson syndrome. Advised patient to stop taking Lamictal and contact office immediately if rash develops and to seek urgent medical attention if rash is severe and/or spreading quickly.    Diagnoses and all orders for this visit:  Attention deficit hyperactivity disorder  (ADHD), unspecified ADHD type -     ADDERALL XR 30 MG 24 hr capsule; Take 1 capsule (30 mg total) by mouth daily. -     amphetamine-dextroamphetamine (ADDERALL)  10 MG tablet; Take 1 tablet (10 mg total) by mouth daily. -     ADDERALL XR 30 MG 24 hr capsule; Take 1 capsule (30 mg total) by mouth daily. -     ADDERALL XR 30 MG 24 hr capsule; Take 1 capsule (30 mg total) by mouth daily. -     amphetamine-dextroamphetamine (ADDERALL) 10 MG tablet; Take 1 tablet (10 mg total) by mouth daily at 12 noon. -     amphetamine-dextroamphetamine (ADDERALL) 10 MG tablet; Take 1 tablet (10 mg total) by mouth daily at 12 noon.  Major depressive disorder, recurrent episode, moderate (HCC) -     sertraline (ZOLOFT) 100 MG tablet; TAKE 2 TABLETS BY MOUTH EVERY DAY  Generalized anxiety disorder -     sertraline (ZOLOFT) 100 MG tablet; TAKE 2 TABLETS BY MOUTH EVERY DAY -     clonazePAM (KLONOPIN) 0.5 MG tablet; Take 1 tablet (0.5 mg total) by mouth 2 (two) times daily as needed for anxiety.  Bipolar I disorder (HCC) -     lamoTRIgine (LAMICTAL) 100 MG tablet; Take one tablet at bedtime.     Please see After Visit Summary for patient specific instructions.  Future Appointments  Date Time Provider Department Center  07/14/2023  9:15 AM [facility] [place] [facility] None  07/14/2023 10:10 AM Lazaro Arms, MD [facility]    No orders of the defined types were placed in this encounter.   -------------------------------

## 2023-07-12 ENCOUNTER — Other Ambulatory Visit: Payer: Medicaid Other

## 2023-07-13 ENCOUNTER — Other Ambulatory Visit: Payer: Self-pay | Admitting: Obstetrics & Gynecology

## 2023-07-13 DIAGNOSIS — N852 Hypertrophy of uterus: Secondary | ICD-10-CM

## 2023-07-14 ENCOUNTER — Ambulatory Visit (INDEPENDENT_AMBULATORY_CARE_PROVIDER_SITE_OTHER): Payer: 59

## 2023-07-14 ENCOUNTER — Other Ambulatory Visit: Payer: Self-pay | Admitting: Obstetrics & Gynecology

## 2023-07-14 ENCOUNTER — Ambulatory Visit: Payer: 59 | Admitting: Obstetrics & Gynecology

## 2023-07-14 ENCOUNTER — Encounter: Payer: Self-pay | Admitting: Obstetrics & Gynecology

## 2023-07-14 VITALS — BP 111/75 | HR 81 | Ht 61.0 in | Wt 128.0 lb

## 2023-07-14 DIAGNOSIS — N946 Dysmenorrhea, unspecified: Secondary | ICD-10-CM

## 2023-07-14 DIAGNOSIS — N941 Unspecified dyspareunia: Secondary | ICD-10-CM

## 2023-07-14 DIAGNOSIS — N852 Hypertrophy of uterus: Secondary | ICD-10-CM

## 2023-07-14 DIAGNOSIS — N920 Excessive and frequent menstruation with regular cycle: Secondary | ICD-10-CM | POA: Diagnosis not present

## 2023-07-14 DIAGNOSIS — N921 Excessive and frequent menstruation with irregular cycle: Secondary | ICD-10-CM

## 2023-07-14 MED ORDER — KETOROLAC TROMETHAMINE 10 MG PO TABS: 10.0000 mg | ORAL_TABLET | Freq: Three times a day (TID) | ORAL | 0 refills | Status: DC | PRN

## 2023-07-14 MED ORDER — CYCLOBENZAPRINE HCL 10 MG PO TABS: 10.0000 mg | ORAL_TABLET | Freq: Three times a day (TID) | ORAL | 1 refills | Status: DC | PRN

## 2023-07-14 NOTE — Progress Notes (Signed)
Follow up appointment for results: sonogram  Chief Complaint  Patient presents with   Follow-up    Ultrasound    Blood pressure 111/75, pulse 81, height 5\' 1"  (1.549 m), weight 128 lb (58.1 kg), last menstrual period 06/30/2023.  US PELVIC COMPLETE WITH TRANSVAGINAL  Result Date: 07/14/2023 Images from the original result were not included.  ..an Financial trader of Ultrasound Medicine Technical sales engineer) accredited practice Center for [facility] [address] [place] [address] Ordering Provider: Lazaro Arms, MD                                                                                                                                   GYNECOLOGIC SONOGRAM Cristina Davenport is a 37 y.o. [phone] No LMP recorded. She is here for a pelvic sonogram for dyspareunia,menorrhagia . Uterus                      8.3 x 4.9 x 6.2 cm, Total uterine volume 131 cc,homogeneous retroverted uterus,WNL Endometrium          4.3 mm, symmetrical, WNL Right ovary             4.7 x 1.8 x 2 cm, WNL Left ovary                4 x 2.7 x 2 cm, WNL Technician Comments: PELVIC US TA/TV: homogeneous retroverted uterus,WNL,9 mm simple nabothian cyst LUS,EEC 4.3 mm,normal ovaries,ovaries appear mobile,no free fluid,no pain during ultrasound Chaperone [employer] 07/14/2023 9:55 AM Clinical Impression and recommendations: I have reviewed the sonogram results above, combined with the patient's current clinical course, below are my impressions and any appropriate recommendations for management based on the sonographic findings. Uterus normal size shape and contour with normal endometrium Endometrium 4.3 mm Ovaries: normal size shape and morphology Lazaro Arms 07/14/2023 10:05 AM       MEDS ordered this encounter: Meds ordered this encounter  Medications   ketorolac (TORADOL) 10 MG tablet    Sig: Take 1 tablet (10 mg total) by mouth every 8 (eight) hours as needed.    Dispense:  15 tablet    Refill:   0   cyclobenzaprine (FLEXERIL) 10 MG tablet    Sig: Take 1 tablet (10 mg total) by mouth every 8 (eight) hours as needed for muscle spasms.    Dispense:  30 tablet    Refill:  1    Orders for this encounter: No orders of the defined types were placed in this encounter.   Impression + Management Plan   ICD-10-CM   1. Dyspareunia in female  N94.10     2. Dysmenorrhea  N94.6     3. Menorrhagia with regular cycle  N92.0      Normal sonogram recommend trial of COC + toradol Flexeril prn  Follow Up: Return if symptoms worsen or fail to  improve.     All questions were answered.  Past Medical History:  Diagnosis Date   Anxiety    Anxiety    Bipolar 1 disorder (HCC)    Depression    Family history of adverse reaction to anesthesia    GERD (gastroesophageal reflux disease)    Hypertension    PIH   IBS (irritable bowel syndrome)    Migraine without aura     Past Surgical History:  Procedure Laterality Date   ABDOMINAL SURGERY     cyst removed    CHOLECYSTECTOMY N/A 07/10/2013   Procedure: LAPAROSCOPIC CHOLECYSTECTOMY;  Surgeon: Fabio Bering, MD;  Location: [facility];  Service: General;  Laterality: N/A;   ESOPHAGOGASTRODUODENOSCOPY N/A 04/30/2013   [id number] distal esophagus of uncertain clinical significance-status post biopsy. Tiny hiatal hernia. No explanation   EXAMINATION UNDER ANESTHESIA  04/21/2012   Gluteal wound repair   INCISIONAL HERNIA REPAIR N/A 04/06/2021   Procedure: INCISIONAL HERNIORRHAPHY;  Surgeon: Franky Macho, MD;  Location: [facility];  Service: General;  Laterality: N/A;   WISDOM TOOTH EXTRACTION      OB History     Gravida  4   Para  3   Term  2   Preterm  1   AB  1   Living  3      SAB  1   IAB  0   Ectopic  0   Multiple  0   Live Births  3           Allergies  Allergen Reactions   Latex Other (See Comments)    General irritation   Doxycycline Rash    Solar rash    Social History   Socioeconomic History    Marital status: Married    Spouse name: Elayne Snare   Number of children: 3   Years of education: 12   Highest education level: Some college, no degree  Occupational History   Occupation: Magazine features editor   Occupation: realtor  Tobacco Use   Smoking status: Former    Current packs/day: 0.00    Average packs/day: 0.5 packs/day for 8.0 years (4.0 ttl pk-yrs)    Types: Cigarettes    Start date: 09/19/2006    Quit date: 09/19/2014    Years since quitting: 8.8   Smokeless tobacco: Never  Vaping Use   Vaping status: Every Day   Substances: Nicotine, Flavoring  Substance and Sexual Activity   Alcohol use: Yes    Alcohol/week: 3.0 standard drinks of alcohol    Types: 3 Glasses of wine per week    Comment: scoially   Drug use: Yes    Types: Marijuana    Comment: for pain in the back   Sexual activity: Yes    Partners: Male    Birth control/protection: Pill  Other Topics Concern   Not on file  Social History Narrative   Married since July 2020,been together for 10 years.Lives with husband and kids.   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   Social Determinants of Health   Financial Resource Strain: Low Risk  (12/29/2021)   Overall Financial Resource Strain (CARDIA)    Difficulty of Paying Living Expenses: Not hard at all  Food Insecurity: No Food Insecurity (12/29/2021)   Hunger Vital Sign    Worried About Running Out of Food in the Last Year: Never true    Ran Out of Food in the Last Year: Never true  Transportation Needs: No Transportation Needs (12/29/2021)  PRAPARE - Administrator, Civil Service (Medical): No    Lack of Transportation (Non-Medical): No  Physical Activity: Sufficiently Active (12/29/2021)   Exercise Vital Sign    Days of Exercise per Week: 5 days    Minutes of Exercise per Session: 40 min  Stress: Stress Concern Present (12/29/2021)   [employer] of Occupational Health - Occupational Stress Questionnaire     Feeling of Stress : Very much  Social Connections: Unknown (12/29/2021)   Social Connection and Isolation Panel [NHANES]    Frequency of Communication with Friends and Family: More than three times a week    Frequency of Social Gatherings with Friends and Family: Once a week    Attends Religious Services: Patient declined    Database administrator or Organizations: No    Attends Engineer, structural: More than 4 times per year    Marital Status: Married    Family History  Adopted: Yes  Problem Relation Age of Onset   Mental illness Mother    Cancer Father        skin cancer   Hypertension Maternal Grandmother    Stroke Maternal Grandmother    Kidney failure Maternal Grandmother    Hypertension Maternal Grandfather    Stroke Maternal Grandfather    Cancer Maternal Grandfather        skin   Crohn's disease Cousin    Colon cancer Neg Hx    Rectal cancer Neg Hx    Stomach cancer Neg Hx

## 2023-07-14 NOTE — Progress Notes (Signed)
PELVIC US TA/TV: homogeneous retroverted uterus,WNL,9 mm simple nabothian cyst LUS,EEC 4.3 mm,normal ovaries,ovaries appear mobile,no free fluid,no pain during ultrasound  Chaperone Kara Mead

## 2023-08-01 ENCOUNTER — Encounter: Payer: Self-pay | Admitting: Obstetrics & Gynecology

## 2023-08-01 ENCOUNTER — Ambulatory Visit: Payer: 59 | Admitting: Obstetrics & Gynecology

## 2023-08-01 VITALS — BP 117/83 | HR 79 | Wt 127.5 lb

## 2023-08-01 DIAGNOSIS — A630 Anogenital (venereal) warts: Secondary | ICD-10-CM

## 2023-08-01 NOTE — Progress Notes (Signed)
Follow up appointment for TCA therapy:   Chief Complaint  Patient presents with   Gynecologic Exam    TCA Treatment    Blood pressure 117/83, pulse 79, weight 127 lb 8 oz (57.8 kg), last menstrual period 06/30/2023.  Area on left vulva present TCA applied without symptoms(pt brought TCA from [place])  MEDS ordered this encounter: No orders of the defined types were placed in this encounter.   Orders for this encounter: No orders of the defined types were placed in this encounter.   Impression + Management Plan   ICD-10-CM   1. Genital warts: 1st TCA therapy today  A63.0       Follow Up: Return in about 2 weeks (around 08/15/2023) for Follow up, with Dr Despina Hidden: TCA.     All questions were answered.  Past Medical History:  Diagnosis Date   Anxiety    Anxiety    Bipolar 1 disorder (HCC)    Depression    Family history of adverse reaction to anesthesia    GERD (gastroesophageal reflux disease)    Hypertension    PIH   IBS (irritable bowel syndrome)    Migraine without aura     Past Surgical History:  Procedure Laterality Date   ABDOMINAL SURGERY     cyst removed    CHOLECYSTECTOMY N/A 07/10/2013   Procedure: LAPAROSCOPIC CHOLECYSTECTOMY;  Surgeon: Fabio Bering, MD;  Location: [facility];  Service: General;  Laterality: N/A;   ESOPHAGOGASTRODUODENOSCOPY N/A 04/30/2013   [id number] distal esophagus of uncertain clinical significance-status post biopsy. Tiny hiatal hernia. No explanation   EXAMINATION UNDER ANESTHESIA  04/21/2012   Gluteal wound repair   INCISIONAL HERNIA REPAIR N/A 04/06/2021   Procedure: INCISIONAL HERNIORRHAPHY;  Surgeon: Franky Macho, MD;  Location: [facility];  Service: General;  Laterality: N/A;   WISDOM TOOTH EXTRACTION      OB History     Gravida  4   Para  3   Term  2   Preterm  1   AB  1   Living  3      SAB  1   IAB  0   Ectopic  0   Multiple  0   Live Births  3           Allergies  Allergen  Reactions   Latex Other (See Comments)    General irritation   Doxycycline Rash    Solar rash    Social History   Socioeconomic History   Marital status: Married    Spouse name: Elayne Snare   Number of children: 3   Years of education: 12   Highest education level: Some college, no degree  Occupational History   Occupation: Magazine features editor   Occupation: realtor  Tobacco Use   Smoking status: Former    Current packs/day: 0.00    Average packs/day: 0.5 packs/day for 8.0 years (4.0 ttl pk-yrs)    Types: Cigarettes    Start date: 09/19/2006    Quit date: 09/19/2014    Years since quitting: 8.8   Smokeless tobacco: Never  Vaping Use   Vaping status: Every Day   Substances: Nicotine, Flavoring  Substance and Sexual Activity   Alcohol use: Yes    Alcohol/week: 3.0 standard drinks of alcohol    Types: 3 Glasses of wine per week    Comment: scoially   Drug use: Yes    Types: Marijuana    Comment: for pain in the back   Sexual activity: Yes  Partners: Male    Birth control/protection: Pill  Other Topics Concern   Not on file  Social History Narrative   Married since July 2020,been together for 10 years.Lives with husband and kids.   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   Social Determinants of Health   Financial Resource Strain: Low Risk  (12/29/2021)   Overall Financial Resource Strain (CARDIA)    Difficulty of Paying Living Expenses: Not hard at all  Food Insecurity: No Food Insecurity (12/29/2021)   Hunger Vital Sign    Worried About Running Out of Food in the Last Year: Never true    Ran Out of Food in the Last Year: Never true  Transportation Needs: No Transportation Needs (12/29/2021)   PRAPARE - Administrator, Civil Service (Medical): No    Lack of Transportation (Non-Medical): No  Physical Activity: Sufficiently Active (12/29/2021)   Exercise Vital Sign    Days of Exercise per Week: 5 days    Minutes of Exercise per  Session: 40 min  Stress: Stress Concern Present (12/29/2021)   [employer] of Occupational Health - Occupational Stress Questionnaire    Feeling of Stress : Very much  Social Connections: Unknown (12/29/2021)   Social Connection and Isolation Panel [NHANES]    Frequency of Communication with Friends and Family: More than three times a week    Frequency of Social Gatherings with Friends and Family: Once a week    Attends Religious Services: Patient declined    Database administrator or Organizations: No    Attends Engineer, structural: More than 4 times per year    Marital Status: Married    Family History  Adopted: Yes  Problem Relation Age of Onset   Mental illness Mother    Cancer Father        skin cancer   Hypertension Maternal Grandmother    Stroke Maternal Grandmother    Kidney failure Maternal Grandmother    Hypertension Maternal Grandfather    Stroke Maternal Grandfather    Cancer Maternal Grandfather        skin   Crohn's disease Cousin    Colon cancer Neg Hx    Rectal cancer Neg Hx    Stomach cancer Neg Hx

## 2023-08-17 ENCOUNTER — Ambulatory Visit: Payer: 59 | Admitting: Obstetrics & Gynecology

## 2023-08-17 ENCOUNTER — Encounter: Payer: Self-pay | Admitting: Obstetrics & Gynecology

## 2023-08-17 VITALS — BP 125/85 | HR 97 | Wt 128.0 lb

## 2023-08-17 DIAGNOSIS — A630 Anogenital (venereal) warts: Secondary | ICD-10-CM

## 2023-08-17 NOTE — Progress Notes (Signed)
Follow up appointment for TCA therapy:   Chief Complaint  Patient presents with   TXA treatment    Blood pressure 125/85, pulse 97, weight 128 lb (58.1 kg).  Area on left vulva present TCA applied without symptoms(pt brought TCA from [place])  MEDS ordered this encounter: No orders of the defined types were placed in this encounter.   Orders for this encounter: No orders of the defined types were placed in this encounter.   Impression + Management Plan   ICD-10-CM   1. Genital warts: 1st TCA therapy today  A63.0        Follow Up: Return in about 1 month (around 09/17/2023) for Follow up, with Dr Despina Hidden.     All questions were answered.  Past Medical History:  Diagnosis Date   Anxiety    Anxiety    Bipolar 1 disorder (HCC)    Depression    Family history of adverse reaction to anesthesia    GERD (gastroesophageal reflux disease)    Hypertension    PIH   IBS (irritable bowel syndrome)    Migraine without aura     Past Surgical History:  Procedure Laterality Date   ABDOMINAL SURGERY     cyst removed    CHOLECYSTECTOMY N/A 07/10/2013   Procedure: LAPAROSCOPIC CHOLECYSTECTOMY;  Surgeon: Fabio Bering, MD;  Location: [facility];  Service: General;  Laterality: N/A;   ESOPHAGOGASTRODUODENOSCOPY N/A 04/30/2013   [id number] distal esophagus of uncertain clinical significance-status post biopsy. Tiny hiatal hernia. No explanation   EXAMINATION UNDER ANESTHESIA  04/21/2012   Gluteal wound repair   INCISIONAL HERNIA REPAIR N/A 04/06/2021   Procedure: INCISIONAL HERNIORRHAPHY;  Surgeon: Franky Macho, MD;  Location: [facility];  Service: General;  Laterality: N/A;   WISDOM TOOTH EXTRACTION      OB History     Gravida  4   Para  3   Term  2   Preterm  1   AB  1   Living  3      SAB  1   IAB  0   Ectopic  0   Multiple  0   Live Births  3           Allergies  Allergen Reactions   Latex Other (See Comments)    General irritation    Doxycycline Rash    Solar rash    Social History   Socioeconomic History   Marital status: Married    Spouse name: Elayne Snare   Number of children: 3   Years of education: 12   Highest education level: Some college, no degree  Occupational History   Occupation: Magazine features editor   Occupation: realtor  Tobacco Use   Smoking status: Former    Current packs/day: 0.00    Average packs/day: 0.5 packs/day for 8.0 years (4.0 ttl pk-yrs)    Types: Cigarettes    Start date: 09/19/2006    Quit date: 09/19/2014    Years since quitting: 8.9   Smokeless tobacco: Never  Vaping Use   Vaping status: Every Day   Substances: Nicotine, Flavoring  Substance and Sexual Activity   Alcohol use: Yes    Alcohol/week: 3.0 standard drinks of alcohol    Types: 3 Glasses of wine per week    Comment: scoially   Drug use: Yes    Types: Marijuana    Comment: for pain in the back   Sexual activity: Yes    Partners: Male    Birth control/protection: Pill  Other Topics Concern   Not on file  Social History Narrative   Married since July 2020,been together for 10 years.Lives with husband and kids.   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   Social Determinants of Health   Financial Resource Strain: Low Risk  (12/29/2021)   Overall Financial Resource Strain (CARDIA)    Difficulty of Paying Living Expenses: Not hard at all  Food Insecurity: No Food Insecurity (12/29/2021)   Hunger Vital Sign    Worried About Running Out of Food in the Last Year: Never true    Ran Out of Food in the Last Year: Never true  Transportation Needs: No Transportation Needs (12/29/2021)   PRAPARE - Administrator, Civil Service (Medical): No    Lack of Transportation (Non-Medical): No  Physical Activity: Sufficiently Active (12/29/2021)   Exercise Vital Sign    Days of Exercise per Week: 5 days    Minutes of Exercise per Session: 40 min  Stress: Stress Concern Present (12/29/2021)    [employer] of Occupational Health - Occupational Stress Questionnaire    Feeling of Stress : Very much  Social Connections: Unknown (12/29/2021)   Social Connection and Isolation Panel [NHANES]    Frequency of Communication with Friends and Family: More than three times a week    Frequency of Social Gatherings with Friends and Family: Once a week    Attends Religious Services: Patient declined    Database administrator or Organizations: No    Attends Engineer, structural: More than 4 times per year    Marital Status: Married    Family History  Adopted: Yes  Problem Relation Age of Onset   Mental illness Mother    Cancer Father        skin cancer   Hypertension Maternal Grandmother    Stroke Maternal Grandmother    Kidney failure Maternal Grandmother    Hypertension Maternal Grandfather    Stroke Maternal Grandfather    Cancer Maternal Grandfather        skin   Crohn's disease Cousin    Colon cancer Neg Hx    Rectal cancer Neg Hx    Stomach cancer Neg Hx

## 2023-08-21 ENCOUNTER — Telehealth: Payer: Self-pay | Admitting: Obstetrics & Gynecology

## 2023-08-21 NOTE — Telephone Encounter (Signed)
Per patient she and Dr. Despina Hidden already discussed this. Will send note to Dr. Despina Hidden.

## 2023-08-21 NOTE — Telephone Encounter (Signed)
Patient is ready to get hysterectomy. Please advise.

## 2023-08-21 NOTE — Telephone Encounter (Signed)
Front desk to schedule appt with Dr. Despina Hidden to discuss/schedule.

## 2023-08-22 ENCOUNTER — Encounter: Payer: Self-pay | Admitting: Obstetrics & Gynecology

## 2023-08-24 ENCOUNTER — Other Ambulatory Visit: Payer: Self-pay | Admitting: Adult Health

## 2023-08-24 DIAGNOSIS — F319 Bipolar disorder, unspecified: Secondary | ICD-10-CM

## 2023-08-29 ENCOUNTER — Other Ambulatory Visit: Payer: Self-pay | Admitting: Gastroenterology

## 2023-08-29 ENCOUNTER — Other Ambulatory Visit (HOSPITAL_COMMUNITY): Payer: Self-pay

## 2023-08-29 ENCOUNTER — Other Ambulatory Visit: Payer: Self-pay | Admitting: Nurse Practitioner

## 2023-08-29 ENCOUNTER — Telehealth: Payer: Self-pay | Admitting: Pharmacy Technician

## 2023-08-29 MED ORDER — MOTEGRITY 2 MG PO TABS: 1.0000 | ORAL_TABLET | Freq: Every day | ORAL | 0 refills | Status: DC

## 2023-08-29 NOTE — Telephone Encounter (Signed)
PA has been submitted, and telephone encounter has been created. 

## 2023-08-29 NOTE — Telephone Encounter (Signed)
Pharmacy Patient Advocate Encounter   Received notification from CoverMyMeds that prior authorization for MOTEGRITY 2MG  is required/requested.   Insurance verification completed.   The patient is insured through [facility] .   Per test claim: PA required; PA submitted to [facility] via CoverMyMeds Key/confirmation #/EOC [id number] Status is pending

## 2023-08-30 ENCOUNTER — Other Ambulatory Visit (HOSPITAL_COMMUNITY): Payer: Self-pay

## 2023-08-30 NOTE — Telephone Encounter (Signed)
Not given by you in the past ok to refill?

## 2023-08-30 NOTE — Telephone Encounter (Signed)
Pharmacy Patient Advocate Encounter  Received notification from [facility] that Prior Authorization for MOTEGRITY 2MG  has been APPROVED from 10.16.24 to 10.15.25. Ran test claim, Copay is $4. This test claim was processed through [facility]- copay amounts may vary at other pharmacies due to pharmacy/plan contracts, or as the patient moves through the different stages of their insurance plan.   PA #/Case ID/Reference #: [id number]  Pt also has [facility] which resulted in copay being $4

## 2023-09-18 ENCOUNTER — Ambulatory Visit: Payer: 59 | Admitting: Obstetrics & Gynecology

## 2023-09-21 ENCOUNTER — Other Ambulatory Visit: Payer: Self-pay | Admitting: Obstetrics & Gynecology

## 2023-09-21 DIAGNOSIS — Z01818 Encounter for other preprocedural examination: Secondary | ICD-10-CM

## 2023-09-21 NOTE — Patient Instructions (Addendum)
Cristina Davenport  09/21/2023     @PREFPERIOPPHARMACY @   Your procedure is scheduled on  09/27/2023.   Report to [facility] at  0600  A.M.   Call this number if you have problems the morning of surgery:  [phone]  If you experience any cold or flu symptoms such as cough, fever, chills, shortness of breath, etc. between now and your scheduled surgery, please notify us at the above number.   Remember:  Do not eat after midnight.    You may drink clear liquids until 0330 am on 09/27/2023.    Clear liquids allowed are:                    Water, Juice (No red color; non-citric and without pulp; diabetics please choose diet or no sugar options), Carbonated beverages (diabetics please choose diet or no sugar options), Clear Tea (No creamer, milk, or cream, including half & half and powdered creamer), Black Coffee Only (No creamer, milk or cream, including half & half and powdered creamer), and Clear Sports drink (No red color; diabetics please choose diet or no sugar options)      At 0330 am on 09/27/2023 drink your carb drink. You can have nothing else after this.     Take these medicines the morning of surgery with A SIP OF WATER                             clonazepam, sertraline.    Do not wear jewelry, make-up or nail polish, including gel polish,  artificial nails, or any other type of covering on natural nails (fingers and  toes).  Do not wear lotions, powders, or perfumes, or deodorant.  Do not shave 48 hours prior to surgery.  Men may shave face and neck.  Do not bring valuables to the hospital.  [facility] is not responsible for any belongings or valuables.  Contacts, dentures or bridgework may not be worn into surgery.  Leave your suitcase in the car.  After surgery it may be brought to your room.  For patients admitted to the hospital, discharge time will be determined by your treatment team.  Patients discharged the day of surgery will not be allowed to  drive home.    Special instructions:   DO NOT smoke tobacco or vape for 24 hors before your procedure.  Please read over the following fact sheets that you were given. Coughing and Deep Breathing, Surgical Site Infection Prevention, Anesthesia Post-op Instructions, and Care and Recovery After Surgery         Total Laparoscopic Hysterectomy, Care After The following information offers guidance on how to care for yourself after your procedure. Your health care provider may also give you more specific instructions. If you have problems or questions, contact your health care provider. What can I expect after the procedure? After the procedure, it is common to have: Pain, bruising, and numbness around your incisions. Tiredness (fatigue). Poor appetite. Less interest in sex. Vaginal discharge or bleeding. You will need to use a sanitary pad after this procedure. Feelings of sadness or other emotions. If your ovaries were also removed, it is also common to have symptoms of menopause, such as hot flashes, night sweats, and lack of sleep (insomnia). Follow these instructions at home: Medicines Take over-the-counter and prescription medicines only as told by your health care provider. Ask your health care provider  if the medicine prescribed to you: Requires you to avoid driving or using machinery. Can cause constipation. You may need to take these actions to prevent or treat constipation: Drink enough fluid to keep your urine pale yellow. Take over-the-counter or prescription medicines. Eat foods that are high in fiber, such as beans, whole grains, and fresh fruits and vegetables. Limit foods that are high in fat and processed sugars, such as fried or sweet foods. Incision care  Follow instructions from your health care provider about how to take care of your incisions. Make sure you: Wash your hands with soap and water for at least 20 seconds before and after you change your bandage  (dressing). If soap and water are not available, use hand sanitizer. Change your dressing as told by your health care provider. Leave stitches (sutures), skin glue, or adhesive strips in place. These skin closures may need to stay in place for 2 weeks or longer. If adhesive strip edges start to loosen and curl up, you may trim the loose edges. Do not remove adhesive strips completely unless your health care provider tells you to do that. Check your incision areas every day for signs of infection. Check for: More redness, swelling, or pain. Fluid or blood. Warmth. Pus or a bad smell. Activity  Rest as told by your health care provider. Avoid sitting for a long time without moving. Get up to take short walks every 1-2 hours. This is important to improve blood flow and breathing. Ask for help if you feel weak or unsteady. Return to your normal activities as told by your health care provider. Ask your health care provider what activities are safe for you. Do not lift anything that is heavier than 10 lb (4.5 kg), or the limit that you are told, for one month after surgery or until your health care provider says that it is safe. If you were given a sedative during the procedure, it can affect you for several hours. Do not drive or operate machinery until your health care provider says that it is safe. Lifestyle Do not use any products that contain nicotine or tobacco. These products include cigarettes, chewing tobacco, and vaping devices, such as e-cigarettes. These can delay healing after surgery. If you need help quitting, ask your health care provider. Do not drink alcohol until your health care provider approves. General instructions  Do not douche, use tampons, or have sex for at least 6 weeks, or as told by your health care provider. If you struggle with physical or emotional changes after your procedure, speak with your health care provider or a therapist. Do not take baths, swim, or use a hot  tub until your health care provider approves. You may only be allowed to take showers for 2-3 weeks. Keep your dressing dry until your health care provider says it can be removed. Try to have someone at home with you for the first 1-2 weeks to help with your daily chores. Wear compression stockings as told by your health care provider. These stockings help to prevent blood clots and reduce swelling in your legs. Keep all follow-up visits. This is important. Contact a health care provider if: You have any of these signs of infection: Chills or a fever. More redness, swelling, or pain around an incision. Fluid or blood coming from an incision. Warmth coming from an incision. Pus or a bad smell coming from an incision. An incision opens. You feel dizzy or light-headed. You have pain or bleeding when  you urinate, or you are unable to urinate. You have abnormal vaginal discharge. You have pain that does not get better with medicine. Get help right away if: You have a fever and your symptoms suddenly get worse. You have severe abdominal pain. You have chest pain or shortness of breath. You faint. You have pain, swelling, or redness in your leg. You have heavy vaginal bleeding with blood clots, soaking through a sanitary pad in less than 1 hour. These symptoms may represent a serious problem that is an emergency. Do not wait to see if the symptoms will go away. Get medical help right away. Call your local emergency services (911 in the [place] Do not drive yourself to the hospital. Summary After the procedure, it is common to have pain and bruising around your incisions. Do not take baths, swim, or use a hot tub until your health care provider approves. Do not lift anything that is heavier than 10 lb (4.5 kg), or the limit that you are told, for one month after surgery or until your health care provider says that it is safe. Tell your health care provider if you have any signs or symptoms of  infection after the procedure. Get help right away if you have severe abdominal pain, chest pain, shortness of breath, or heavy bleeding from your vagina. This information is not intended to replace advice given to you by your health care provider. Make sure you discuss any questions you have with your health care provider. Document Revised: 07/02/2020 Document Reviewed: 07/03/2020 Elsevier Patient Education  2024 [employer] General Anesthesia, Adult, Care After The following information offers guidance on how to care for yourself after your procedure. Your health care provider may also give you more specific instructions. If you have problems or questions, contact your health care provider. What can I expect after the procedure? After the procedure, it is common for people to: Have pain or discomfort at the IV site. Have nausea or vomiting. Have a sore throat or hoarseness. Have trouble concentrating. Feel cold or chills. Feel weak, sleepy, or tired (fatigue). Have soreness and body aches. These can affect parts of the body that were not involved in surgery. Follow these instructions at home: For the time period you were told by your health care provider:  Rest. Do not participate in activities where you could fall or become injured. Do not drive or use machinery. Do not drink alcohol. Do not take sleeping pills or medicines that cause drowsiness. Do not make important decisions or sign legal documents. Do not take care of children on your own. General instructions Drink enough fluid to keep your urine pale yellow. If you have sleep apnea, surgery and certain medicines can increase your risk for breathing problems. Follow instructions from your health care provider about wearing your sleep device: Anytime you are sleeping, including during daytime naps. While taking prescription pain medicines, sleeping medicines, or medicines that make you drowsy. Return to your normal activities  as told by your health care provider. Ask your health care provider what activities are safe for you. Take over-the-counter and prescription medicines only as told by your health care provider. Do not use any products that contain nicotine or tobacco. These products include cigarettes, chewing tobacco, and vaping devices, such as e-cigarettes. These can delay incision healing after surgery. If you need help quitting, ask your health care provider. Contact a health care provider if: You have nausea or vomiting that does not get better with medicine. You  vomit every time you eat or drink. You have pain that does not get better with medicine. You cannot urinate or have bloody urine. You develop a skin rash. You have a fever. Get help right away if: You have trouble breathing. You have chest pain. You vomit blood. These symptoms may be an emergency. Get help right away. Call 911. Do not wait to see if the symptoms will go away. Do not drive yourself to the hospital. Summary After the procedure, it is common to have a sore throat, hoarseness, nausea, vomiting, or to feel weak, sleepy, or fatigue. For the time period you were told by your health care provider, do not drive or use machinery. Get help right away if you have difficulty breathing, have chest pain, or vomit blood. These symptoms may be an emergency. This information is not intended to replace advice given to you by your health care provider. Make sure you discuss any questions you have with your health care provider. Document Revised: 01/28/2022 Document Reviewed: 01/28/2022 Elsevier Patient Education  2024 [employer] How to Use Chlorhexidine Before Surgery Chlorhexidine gluconate (CHG) is a germ-killing (antiseptic) solution that is used to clean the skin. It can get rid of the bacteria that normally live on the skin and can keep them away for about 24 hours. To clean your skin with CHG, you may be given: A CHG solution to use in  the shower or as part of a sponge bath. A prepackaged cloth that contains CHG. Cleaning your skin with CHG may help lower the risk for infection: While you are staying in the intensive care unit of the hospital. If you have a vascular access, such as a central line, to provide short-term or long-term access to your veins. If you have a catheter to drain urine from your bladder. If you are on a ventilator. A ventilator is a machine that helps you breathe by moving air in and out of your lungs. After surgery. What are the risks? Risks of using CHG include: A skin reaction. Hearing loss, if CHG gets in your ears and you have a perforated eardrum. Eye injury, if CHG gets in your eyes and is not rinsed out. The CHG product catching fire. Make sure that you avoid smoking and flames after applying CHG to your skin. Do not use CHG: If you have a chlorhexidine allergy or have previously reacted to chlorhexidine. On babies younger than 35 months of age. How to use CHG solution Use CHG only as told by your health care provider, and follow the instructions on the label. Use the full amount of CHG as directed. Usually, this is one bottle. During a shower Follow these steps when using CHG solution during a shower (unless your health care provider gives you different instructions): Start the shower. Use your normal soap and shampoo to wash your face and hair. Turn off the shower or move out of the shower stream. Pour the CHG onto a clean washcloth. Do not use any type of brush or rough-edged sponge. Starting at your neck, lather your body down to your toes. Make sure you follow these instructions: If you will be having surgery, pay special attention to the part of your body where you will be having surgery. Scrub this area for at least 1 minute. Do not use CHG on your head or face. If the solution gets into your ears or eyes, rinse them well with water. Avoid your genital area. Avoid any areas of skin  that  have broken skin, cuts, or scrapes. Scrub your back and under your arms. Make sure to wash skin folds. Let the lather sit on your skin for 1-2 minutes or as long as told by your health care provider. Thoroughly rinse your entire body in the shower. Make sure that all body creases and crevices are rinsed well. Dry off with a clean towel. Do not put any substances on your body afterward--such as powder, lotion, or perfume--unless you are told to do so by your health care provider. Only use lotions that are recommended by the manufacturer. Put on clean clothes or pajamas. If it is the night before your surgery, sleep in clean sheets.  During a sponge bath Follow these steps when using CHG solution during a sponge bath (unless your health care provider gives you different instructions): Use your normal soap and shampoo to wash your face and hair. Pour the CHG onto a clean washcloth. Starting at your neck, lather your body down to your toes. Make sure you follow these instructions: If you will be having surgery, pay special attention to the part of your body where you will be having surgery. Scrub this area for at least 1 minute. Do not use CHG on your head or face. If the solution gets into your ears or eyes, rinse them well with water. Avoid your genital area. Avoid any areas of skin that have broken skin, cuts, or scrapes. Scrub your back and under your arms. Make sure to wash skin folds. Let the lather sit on your skin for 1-2 minutes or as long as told by your health care provider. Using a different clean, wet washcloth, thoroughly rinse your entire body. Make sure that all body creases and crevices are rinsed well. Dry off with a clean towel. Do not put any substances on your body afterward--such as powder, lotion, or perfume--unless you are told to do so by your health care provider. Only use lotions that are recommended by the manufacturer. Put on clean clothes or pajamas. If it is the  night before your surgery, sleep in clean sheets. How to use CHG prepackaged cloths Only use CHG cloths as told by your health care provider, and follow the instructions on the label. Use the CHG cloth on clean, dry skin. Do not use the CHG cloth on your head or face unless your health care provider tells you to. When washing with the CHG cloth: Avoid your genital area. Avoid any areas of skin that have broken skin, cuts, or scrapes. Before surgery Follow these steps when using a CHG cloth to clean before surgery (unless your health care provider gives you different instructions): Using the CHG cloth, vigorously scrub the part of your body where you will be having surgery. Scrub using a back-and-forth motion for 3 minutes. The area on your body should be completely wet with CHG when you are done scrubbing. Do not rinse. Discard the cloth and let the area air-dry. Do not put any substances on the area afterward, such as powder, lotion, or perfume. Put on clean clothes or pajamas. If it is the night before your surgery, sleep in clean sheets.  For general bathing Follow these steps when using CHG cloths for general bathing (unless your health care provider gives you different instructions). Use a separate CHG cloth for each area of your body. Make sure you wash between any folds of skin and between your fingers and toes. Wash your body in the following order, switching to a new  cloth after each step: The front of your neck, shoulders, and chest. Both of your arms, under your arms, and your hands. Your stomach and groin area, avoiding the genitals. Your right leg and foot. Your left leg and foot. The back of your neck, your back, and your buttocks. Do not rinse. Discard the cloth and let the area air-dry. Do not put any substances on your body afterward--such as powder, lotion, or perfume--unless you are told to do so by your health care provider. Only use lotions that are recommended by the  manufacturer. Put on clean clothes or pajamas. Contact a health care provider if: Your skin gets irritated after scrubbing. You have questions about using your solution or cloth. You swallow any chlorhexidine. Call your local poison control center ([phone] in the [place] Get help right away if: Your eyes itch badly, or they become very red or swollen. Your skin itches badly and is red or swollen. Your hearing changes. You have trouble seeing. You have swelling or tingling in your mouth or throat. You have trouble breathing. These symptoms may represent a serious problem that is an emergency. Do not wait to see if the symptoms will go away. Get medical help right away. Call your local emergency services (911 in the [place] Do not drive yourself to the hospital. Summary Chlorhexidine gluconate (CHG) is a germ-killing (antiseptic) solution that is used to clean the skin. Cleaning your skin with CHG may help to lower your risk for infection. You may be given CHG to use for bathing. It may be in a bottle or in a prepackaged cloth to use on your skin. Carefully follow your health care provider's instructions and the instructions on the product label. Do not use CHG if you have a chlorhexidine allergy. Contact your health care provider if your skin gets irritated after scrubbing. This information is not intended to replace advice given to you by your health care provider. Make sure you discuss any questions you have with your health care provider. Document Revised: 02/28/2022 Document Reviewed: 01/11/2021 Elsevier Patient Education  2023 [employer].

## 2023-09-22 ENCOUNTER — Encounter (HOSPITAL_COMMUNITY)
Admission: RE | Admit: 2023-09-22 | Discharge: 2023-09-22 | Disposition: A | Discharge status: Home / Self Care | Payer: 59 | Source: Ambulatory Visit | Attending: Obstetrics & Gynecology | Admitting: Obstetrics & Gynecology

## 2023-09-22 ENCOUNTER — Encounter (HOSPITAL_COMMUNITY): Payer: Self-pay

## 2023-09-22 VITALS — BP 115/76 | HR 92 | Temp 97.7°F | Resp 18 | Ht 61.0 in | Wt 128.1 lb

## 2023-09-22 DIAGNOSIS — Z01812 Encounter for preprocedural laboratory examination: Secondary | ICD-10-CM | POA: Insufficient documentation

## 2023-09-22 DIAGNOSIS — Z01818 Encounter for other preprocedural examination: Secondary | ICD-10-CM

## 2023-09-22 DIAGNOSIS — I10 Essential (primary) hypertension: Secondary | ICD-10-CM | POA: Insufficient documentation

## 2023-09-22 LAB — TYPE AND SCREEN
ABO/RH(D): A POS
Antibody Screen: NEGATIVE

## 2023-09-22 LAB — CBC
HCT: 39 % (ref 36.0–46.0)
Hemoglobin: 13 g/dL (ref 12.0–15.0)
MCH: 30.2 pg (ref 26.0–34.0)
MCHC: 33.3 g/dL (ref 30.0–36.0)
MCV: 90.7 fL (ref 80.0–100.0)
Platelets: 277 10*3/uL (ref 150–400)
RBC: 4.3 MIL/uL (ref 3.87–5.11)
RDW: 12.2 % (ref 11.5–15.5)
WBC: 6.3 10*3/uL (ref 4.0–10.5)
nRBC: 0 % (ref 0.0–0.2)

## 2023-09-22 LAB — URINALYSIS, ROUTINE W REFLEX MICROSCOPIC
Bilirubin Urine: NEGATIVE
Glucose, UA: NEGATIVE mg/dL
Hgb urine dipstick: NEGATIVE
Ketones, ur: 5 mg/dL — AB
Leukocytes,Ua: NEGATIVE
Nitrite: NEGATIVE
Protein, ur: NEGATIVE mg/dL
Specific Gravity, Urine: 1.026 (ref 1.005–1.030)
pH: 6 (ref 5.0–8.0)

## 2023-09-22 LAB — COMPREHENSIVE METABOLIC PANEL
ALT: 16 U/L (ref 0–44)
AST: 18 U/L (ref 15–41)
Albumin: 4.3 g/dL (ref 3.5–5.0)
Alkaline Phosphatase: 41 U/L (ref 38–126)
Anion gap: 7 (ref 5–15)
BUN: 10 mg/dL (ref 6–20)
CO2: 25 mmol/L (ref 22–32)
Calcium: 8.7 mg/dL — ABNORMAL LOW (ref 8.9–10.3)
Chloride: 104 mmol/L (ref 98–111)
Creatinine, Ser: 0.63 mg/dL (ref 0.44–1.00)
GFR, Estimated: >60 mL/min (ref >60)
Glucose, Bld: 88 mg/dL (ref 70–99)
Potassium: 3.4 mmol/L — ABNORMAL LOW (ref 3.5–5.1)
Sodium: 136 mmol/L (ref 135–145)
Total Bilirubin: 0.8 mg/dL (ref <1.2)
Total Protein: 6.9 g/dL (ref 6.5–8.1)

## 2023-09-22 LAB — RAPID HIV SCREEN (HIV 1/2 AB+AG)
HIV 1/2 Antibodies: NONREACTIVE
HIV-1 P24 Antigen - HIV24: NONREACTIVE

## 2023-09-22 LAB — PREGNANCY, URINE: Preg Test, Ur: NEGATIVE

## 2023-09-25 ENCOUNTER — Encounter (HOSPITAL_COMMUNITY): Payer: Self-pay | Admitting: Certified Registered"

## 2023-09-26 NOTE — Anesthesia Preprocedure Evaluation (Addendum)
Anesthesia Evaluation  Patient identified by MRN, date of birth, ID band Patient awake    Reviewed: Allergy & Precautions, NPO status , Patient's Chart, lab work & pertinent test results  History of Anesthesia Complications Negative for: history of anesthetic complications  Airway Mallampati: I  TM Distance: >3 FB Neck ROM: Full    Dental no notable dental hx. (+) Dental Advisory Given, Teeth Intact   Pulmonary shortness of breath, Patient abstained from smoking., former smoker          Cardiovascular Exercise Tolerance: Good hypertension (gestational ),      Neuro/Psych  Headaches PSYCHIATRIC DISORDERS Anxiety Depression Bipolar Disorder      GI/Hepatic Neg liver ROS,GERD  Medicated,,  Endo/Other  negative endocrine ROS    Renal/GU negative Renal ROS     Musculoskeletal negative musculoskeletal ROS (+)    Abdominal   Peds  (+) ADHD Hematology negative hematology ROS (+)   Anesthesia Other Findings   Reproductive/Obstetrics negative OB ROS                             Anesthesia Physical Anesthesia Plan  ASA: 2  Anesthesia Plan: General   Post-op Pain Management: Dilaudid IV   Induction: Intravenous  PONV Risk Score and Plan: Ondansetron, Dexamethasone and Midazolam  Airway Management Planned: Oral ETT  Additional Equipment: None  Intra-op Plan:   Post-operative Plan: Extubation in OR  Informed Consent: I have reviewed the patients History and Physical, chart, labs and discussed the procedure including the risks, benefits and alternatives for the proposed anesthesia with the patient or authorized representative who has indicated his/her understanding and acceptance.     Dental advisory given  Plan Discussed with: CRNA  Anesthesia Plan Comments:         Anesthesia Quick Evaluation

## 2023-09-27 ENCOUNTER — Ambulatory Visit (HOSPITAL_COMMUNITY): Admission: RE | Admit: 2023-09-27 | Payer: 59 | Source: Home / Self Care | Admitting: Obstetrics & Gynecology

## 2023-09-27 ENCOUNTER — Encounter (HOSPITAL_COMMUNITY): Admission: RE | Payer: Self-pay | Source: Home / Self Care

## 2023-09-27 SURGERY — XI ROBOTIC ASSISTED LAPAROSCOPIC HYSTERECTOMY AND SALPINGECTOMY
Anesthesia: General | Laterality: Bilateral

## 2023-10-02 ENCOUNTER — Telehealth: Payer: Self-pay | Admitting: Adult Health

## 2023-10-02 NOTE — Telephone Encounter (Signed)
Cristina Davenport called and LM at 3:35 requesting refills of her Adderall XR 30mg  and 10mg . Appt 11/20/

## 2023-10-03 ENCOUNTER — Other Ambulatory Visit: Payer: Self-pay

## 2023-10-03 DIAGNOSIS — F909 Attention-deficit hyperactivity disorder, unspecified type: Secondary | ICD-10-CM

## 2023-10-03 MED ORDER — ADDERALL XR 30 MG PO CP24: 30.0000 mg | ORAL_CAPSULE | Freq: Every day | ORAL | 0 refills | Status: DC

## 2023-10-03 MED ORDER — AMPHETAMINE-DEXTROAMPHETAMINE 10 MG PO TABS: 10.0000 mg | ORAL_TABLET | Freq: Every day | ORAL | 0 refills | Status: DC

## 2023-10-03 NOTE — Telephone Encounter (Signed)
Pended both IR and XR dosing to [facility] on [address]. No pharmacy was listed and I called patient because [facility] are [facility] [address] She said it was not the one in [employer]. I asked if she would fill at [facility] again and she said no. I updated pharmacy profile.

## 2023-10-04 ENCOUNTER — Ambulatory Visit: Payer: 59 | Admitting: Adult Health

## 2023-10-04 ENCOUNTER — Encounter: Payer: Self-pay | Admitting: Adult Health

## 2023-10-04 DIAGNOSIS — F909 Attention-deficit hyperactivity disorder, unspecified type: Secondary | ICD-10-CM | POA: Diagnosis not present

## 2023-10-04 DIAGNOSIS — F411 Generalized anxiety disorder: Secondary | ICD-10-CM | POA: Diagnosis not present

## 2023-10-04 DIAGNOSIS — F319 Bipolar disorder, unspecified: Secondary | ICD-10-CM

## 2023-10-04 DIAGNOSIS — F422 Mixed obsessional thoughts and acts: Secondary | ICD-10-CM
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
## 2023-10-05 ENCOUNTER — Encounter: Payer: 59 | Admitting: Obstetrics & Gynecology

## 2023-10-31 ENCOUNTER — Other Ambulatory Visit: Payer: Self-pay | Admitting: Obstetrics & Gynecology

## 2023-10-31 DIAGNOSIS — Z01818 Encounter for other preprocedural examination: Secondary | ICD-10-CM

## 2023-11-01 ENCOUNTER — Encounter (HOSPITAL_COMMUNITY): Admission: RE | Admit: 2023-11-01 | Payer: 59 | Source: Ambulatory Visit

## 2023-11-01 NOTE — Patient Instructions (Signed)
Your procedure is scheduled on: 11/03/2023  Report to [facility] Main Entrance at  9:45   AM.  Call this number if you have problems the morning of surgery: [phone]   Remember:   Do not Eat after midnight, You may have CLEAR liquids until 7:45 am Drink Carb loading at 7:30 am         No Smoking/Vaping the morning of surgery  :  Take these medicines the morning of surgery with A SIP OF WATER: Klonopin and lamictal   Do not wear jewelry, make-up or nail polish.  Do not wear lotions, powders, or perfumes. You may wear deodorant.  Do not shave 48 hours prior to surgery. Men may shave face and neck.  Do not bring valuables to the hospital.  Contacts, dentures or bridgework may not be worn into surgery.  Leave suitcase in the car. After surgery it may be brought to your room.  For patients admitted to the hospital, checkout time is 11:00 AM the day of discharge.   Patients discharged the day of surgery will not be allowed to drive home.    Special Instructions: Shower using CHG night before surgery and shower the day of surgery use CHG.  Use special wash - you have one bottle of CHG for all showers.  You should use approximately 1/2 of the bottle for each shower. How to Use Chlorhexidine Before Surgery Chlorhexidine gluconate (CHG) is a germ-killing (antiseptic) solution that is used to clean the skin. It can get rid of the bacteria that normally live on the skin and can keep them away for about 24 hours. To clean your skin with CHG, you may be given: A CHG solution to use in the shower or as part of a sponge bath. A prepackaged cloth that contains CHG. Cleaning your skin with CHG may help lower the risk for infection: While you are staying in the intensive care unit of the hospital. If you have a vascular access, such as a central line, to provide short-term or long-term access to your veins. If you have a catheter to drain urine from your bladder. If you are on a ventilator. A  ventilator is a machine that helps you breathe by moving air in and out of your lungs. After surgery. What are the risks? Risks of using CHG include: A skin reaction. Hearing loss, if CHG gets in your ears and you have a perforated eardrum. Eye injury, if CHG gets in your eyes and is not rinsed out. The CHG product catching fire. Make sure that you avoid smoking and flames after applying CHG to your skin. Do not use CHG: If you have a chlorhexidine allergy or have previously reacted to chlorhexidine. On babies younger than 50 months of age. How to use CHG solution Use CHG only as told by your health care provider, and follow the instructions on the label. Use the full amount of CHG as directed. Usually, this is one bottle. During a shower Follow these steps when using CHG solution during a shower (unless your health care provider gives you different instructions): Start the shower. Use your normal soap and shampoo to wash your face and hair. Turn off the shower or move out of the shower stream. Pour the CHG onto a clean washcloth. Do not use any type of brush or rough-edged sponge. Starting at your neck, lather your body down to your toes. Make sure you follow these instructions: If you will be having surgery, pay special attention  to the part of your body where you will be having surgery. Scrub this area for at least 1 minute. Do not use CHG on your head or face. If the solution gets into your ears or eyes, rinse them well with water. Avoid your genital area. Avoid any areas of skin that have broken skin, cuts, or scrapes. Scrub your back and under your arms. Make sure to wash skin folds. Let the lather sit on your skin for 1-2 minutes or as long as told by your health care provider. Thoroughly rinse your entire body in the shower. Make sure that all body creases and crevices are rinsed well. Dry off with a clean towel. Do not put any substances on your body afterward--such as powder,  lotion, or perfume--unless you are told to do so by your health care provider. Only use lotions that are recommended by the manufacturer. Put on clean clothes or pajamas. If it is the night before your surgery, sleep in clean sheets.  During a sponge bath Follow these steps when using CHG solution during a sponge bath (unless your health care provider gives you different instructions): Use your normal soap and shampoo to wash your face and hair. Pour the CHG onto a clean washcloth. Starting at your neck, lather your body down to your toes. Make sure you follow these instructions: If you will be having surgery, pay special attention to the part of your body where you will be having surgery. Scrub this area for at least 1 minute. Do not use CHG on your head or face. If the solution gets into your ears or eyes, rinse them well with water. Avoid your genital area. Avoid any areas of skin that have broken skin, cuts, or scrapes. Scrub your back and under your arms. Make sure to wash skin folds. Let the lather sit on your skin for 1-2 minutes or as long as told by your health care provider. Using a different clean, wet washcloth, thoroughly rinse your entire body. Make sure that all body creases and crevices are rinsed well. Dry off with a clean towel. Do not put any substances on your body afterward--such as powder, lotion, or perfume--unless you are told to do so by your health care provider. Only use lotions that are recommended by the manufacturer. Put on clean clothes or pajamas. If it is the night before your surgery, sleep in clean sheets. How to use CHG prepackaged cloths Only use CHG cloths as told by your health care provider, and follow the instructions on the label. Use the CHG cloth on clean, dry skin. Do not use the CHG cloth on your head or face unless your health care provider tells you to. When washing with the CHG cloth: Avoid your genital area. Avoid any areas of skin that have  broken skin, cuts, or scrapes. Before surgery Follow these steps when using a CHG cloth to clean before surgery (unless your health care provider gives you different instructions): Using the CHG cloth, vigorously scrub the part of your body where you will be having surgery. Scrub using a back-and-forth motion for 3 minutes. The area on your body should be completely wet with CHG when you are done scrubbing. Do not rinse. Discard the cloth and let the area air-dry. Do not put any substances on the area afterward, such as powder, lotion, or perfume. Put on clean clothes or pajamas. If it is the night before your surgery, sleep in clean sheets.  For general bathing Follow these  steps when using CHG cloths for general bathing (unless your health care provider gives you different instructions). Use a separate CHG cloth for each area of your body. Make sure you wash between any folds of skin and between your fingers and toes. Wash your body in the following order, switching to a new cloth after each step: The front of your neck, shoulders, and chest. Both of your arms, under your arms, and your hands. Your stomach and groin area, avoiding the genitals. Your right leg and foot. Your left leg and foot. The back of your neck, your back, and your buttocks. Do not rinse. Discard the cloth and let the area air-dry. Do not put any substances on your body afterward--such as powder, lotion, or perfume--unless you are told to do so by your health care provider. Only use lotions that are recommended by the manufacturer. Put on clean clothes or pajamas. Contact a health care provider if: Your skin gets irritated after scrubbing. You have questions about using your solution or cloth. You swallow any chlorhexidine. Call your local poison control center ([phone] in the [place] Get help right away if: Your eyes itch badly, or they become very red or swollen. Your skin itches badly and is red or  swollen. Your hearing changes. You have trouble seeing. You have swelling or tingling in your mouth or throat. You have trouble breathing. These symptoms may represent a serious problem that is an emergency. Do not wait to see if the symptoms will go away. Get medical help right away. Call your local emergency services (911 in the [place] Do not drive yourself to the hospital. Summary Chlorhexidine gluconate (CHG) is a germ-killing (antiseptic) solution that is used to clean the skin. Cleaning your skin with CHG may help to lower your risk for infection. You may be given CHG to use for bathing. It may be in a bottle or in a prepackaged cloth to use on your skin. Carefully follow your health care provider's instructions and the instructions on the product label. Do not use CHG if you have a chlorhexidine allergy. Contact your health care provider if your skin gets irritated after scrubbing. This information is not intended to replace advice given to you by your health care provider. Make sure you discuss any questions you have with your health care provider. Document Revised: 02/28/2022 Document Reviewed: 01/11/2021 Elsevier Patient Education  2023 [employer] Laparoscopically Assisted Hysterectomy, Care After After a LAVH, it's common to have soreness and numbness in your incision areas. You'll have pain in your belly. You may also have: Bleeding and discharge from the vagina. Tiredness. Sadness and other emotions. If your ovaries were taken out, you may also have symptoms of menopause, such as hot flashes, night sweats, and lack of sleep. Follow these instructions at home: Medicines Take over-the-counter and prescription medicines only as told by your health care provider. If you were given antibiotics, take them as told by your provider. Do not stop taking the antibiotic even if you start to feel better. Ask your provider if the medicine prescribed to you: Requires you to avoid driving  or using machinery. Can cause constipation. You may need to take these actions to prevent or treat constipation: Drink enough fluid to keep your pee (urine) pale yellow. Take over-the-counter or prescription medicines. Eat foods that are high in fiber, such as beans, whole grains, and fresh fruits and vegetables. Limit foods that are high in fat and processed sugars, such as fried or sweet foods.  Incision care  Follow instructions from your provider about how to take care of your incisions. Make sure you: Wash your hands with soap and water for at least 20 seconds before and after you change your bandage. If soap and water aren't available, use hand sanitizer. Change your dressing as told by your provider. Leave stitches, skin glue, or tape strips in place. These skin closures may need to stay in place for 2 weeks or longer. If tape strip edges start to loosen and curl up, you may trim the loose edges. Do not remove tape strips completely unless your provider tells you to do that. Check your incision areas every day for signs of infection. Check for: More redness, swelling, or pain. More fluid or blood. Warmth. Pus or a bad smell. Activity  Rest as told by your provider. Do not sit for a long time without moving. Get up to take short walks every 1-2 hours. This will improve blood flow and breathing. Ask for help if you feel weak or unsteady. You may have to avoid lifting. Ask your provider how much you can safely lift. Return to your normal activities as told by your provider. Ask your provider what activities are safe for you. Lifestyle Do not use any products that contain nicotine or tobacco. These products include cigarettes, chewing tobacco, and vaping devices, such as e-cigarettes. These can delay healing after surgery. If you need help quitting, ask your provider. Do not drink alcohol until your provider approves. General instructions Do not douche, use tampons, have sex, or put  anything in the vagina for at least 6 weeks. If you struggle with physical or emotional changes after your procedure, speak with your provider or a therapist. Do not take baths, swim, or use a hot tub until your provider approves. Ask your provider if you may take showers. You may only be allowed to take sponge baths. Try to have someone at home with you for the first 1-2 weeks to help with your daily chores. Wear compression stockings as told by your provider. These stockings help to prevent blood clots and reduce swelling in your legs. Your provider may give you more instructions. Make sure you know what you can and can't do. Contact a health care provider if: You have any signs of infection. You have pain and your pain medicine doesn't help. You feel dizzy or light-headed. You have trouble peeing. You vomit or feel like you may vomit, and the symptoms do not go away. You have pus or discharge from your vagina that smells bad. Get help right away if: You have a fever and your symptoms suddenly get worse. You have very bad pain in the abdomen. You have chest pain or shortness of breath. You faint. You have pain, swelling, or redness in your leg. You have heavy bleeding in your vagina that soaks through a pad in less than 1 hour. You see blood clots in your bleeding. These symptoms may be an emergency. Get help right away. Call 911. Do not wait to see if the symptoms will go away. Do not drive yourself to the hospital. This information is not intended to replace advice given to you by your health care provider. Make sure you discuss any questions you have with your health care provider. Document Revised: 02/10/2023 Document Reviewed: 02/10/2023 Elsevier Patient Education  2024 [employer]. General Anesthesia, Adult, Care After The following information offers guidance on how to care for yourself after your procedure. Your health care provider  may also give you more specific  instructions. If you have problems or questions, contact your health care provider. What can I expect after the procedure? After the procedure, it is common for people to: Have pain or discomfort at the IV site. Have nausea or vomiting. Have a sore throat or hoarseness. Have trouble concentrating. Feel cold or chills. Feel weak, sleepy, or tired (fatigue). Have soreness and body aches. These can affect parts of the body that were not involved in surgery. Follow these instructions at home: For the time period you were told by your health care provider:  Rest. Do not participate in activities where you could fall or become injured. Do not drive or use machinery. Do not drink alcohol. Do not take sleeping pills or medicines that cause drowsiness. Do not make important decisions or sign legal documents. Do not take care of children on your own. General instructions Drink enough fluid to keep your urine pale yellow. If you have sleep apnea, surgery and certain medicines can increase your risk for breathing problems. Follow instructions from your health care provider about wearing your sleep device: Anytime you are sleeping, including during daytime naps. While taking prescription pain medicines, sleeping medicines, or medicines that make you drowsy. Return to your normal activities as told by your health care provider. Ask your health care provider what activities are safe for you. Take over-the-counter and prescription medicines only as told by your health care provider. Do not use any products that contain nicotine or tobacco. These products include cigarettes, chewing tobacco, and vaping devices, such as e-cigarettes. These can delay incision healing after surgery. If you need help quitting, ask your health care provider. Contact a health care provider if: You have nausea or vomiting that does not get better with medicine. You vomit every time you eat or drink. You have pain that does  not get better with medicine. You cannot urinate or have bloody urine. You develop a skin rash. You have a fever. Get help right away if: You have trouble breathing. You have chest pain. You vomit blood. These symptoms may be an emergency. Get help right away. Call 911. Do not wait to see if the symptoms will go away. Do not drive yourself to the hospital. Summary After the procedure, it is common to have a sore throat, hoarseness, nausea, vomiting, or to feel weak, sleepy, or fatigue. For the time period you were told by your health care provider, do not drive or use machinery. Get help right away if you have difficulty breathing, have chest pain, or vomit blood. These symptoms may be an emergency. This information is not intended to replace advice given to you by your health care provider. Make sure you discuss any questions you have with your health care provider. Document Revised: 01/28/2022 Document Reviewed: 01/28/2022 Elsevier Patient Education  2024 [employer].

## 2023-11-02 ENCOUNTER — Encounter (HOSPITAL_COMMUNITY)
Admission: RE | Admit: 2023-11-02 | Discharge: 2023-11-02 | Disposition: A | Discharge status: Home / Self Care | Payer: 59 | Source: Ambulatory Visit | Attending: Obstetrics & Gynecology | Admitting: Obstetrics & Gynecology

## 2023-11-02 ENCOUNTER — Telehealth: Payer: Self-pay | Admitting: Adult Health

## 2023-11-02 ENCOUNTER — Telehealth: Payer: Self-pay | Admitting: Psychiatry

## 2023-11-02 ENCOUNTER — Other Ambulatory Visit: Payer: Self-pay

## 2023-11-02 DIAGNOSIS — F909 Attention-deficit hyperactivity disorder, unspecified type: Secondary | ICD-10-CM

## 2023-11-02 DIAGNOSIS — Z01812 Encounter for preprocedural laboratory examination: Secondary | ICD-10-CM | POA: Insufficient documentation

## 2023-11-02 DIAGNOSIS — Z01818 Encounter for other preprocedural examination: Secondary | ICD-10-CM

## 2023-11-02 LAB — CBC
HCT: 41.9 % (ref 36.0–46.0)
Hemoglobin: 14 g/dL (ref 12.0–15.0)
MCH: 30.2 pg (ref 26.0–34.0)
MCHC: 33.4 g/dL (ref 30.0–36.0)
MCV: 90.3 fL (ref 80.0–100.0)
Platelets: 276 10*3/uL (ref 150–400)
RBC: 4.64 MIL/uL (ref 3.87–5.11)
RDW: 12.2 % (ref 11.5–15.5)
WBC: 7.3 10*3/uL (ref 4.0–10.5)
nRBC: 0 % (ref 0.0–0.2)

## 2023-11-02 LAB — URINALYSIS, ROUTINE W REFLEX MICROSCOPIC
Bilirubin Urine: NEGATIVE
Glucose, UA: NEGATIVE mg/dL
Hgb urine dipstick: NEGATIVE
Ketones, ur: NEGATIVE mg/dL
Leukocytes,Ua: NEGATIVE
Nitrite: NEGATIVE
Protein, ur: NEGATIVE mg/dL
Specific Gravity, Urine: 1.021 (ref 1.005–1.030)
pH: 7 (ref 5.0–8.0)

## 2023-11-02 LAB — RAPID HIV SCREEN (HIV 1/2 AB+AG)
HIV 1/2 Antibodies: NONREACTIVE
HIV-1 P24 Antigen - HIV24: NONREACTIVE

## 2023-11-02 LAB — COMPREHENSIVE METABOLIC PANEL
ALT: 16 U/L (ref 0–44)
AST: 17 U/L (ref 15–41)
Albumin: 4.3 g/dL (ref 3.5–5.0)
Alkaline Phosphatase: 43 U/L (ref 38–126)
Anion gap: 11 (ref 5–15)
BUN: 8 mg/dL (ref 6–20)
CO2: 23 mmol/L (ref 22–32)
Calcium: 8.9 mg/dL (ref 8.9–10.3)
Chloride: 102 mmol/L (ref 98–111)
Creatinine, Ser: 0.64 mg/dL (ref 0.44–1.00)
GFR, Estimated: >60 mL/min (ref >60)
Glucose, Bld: 92 mg/dL (ref 70–99)
Potassium: 3.3 mmol/L — ABNORMAL LOW (ref 3.5–5.1)
Sodium: 136 mmol/L (ref 135–145)
Total Bilirubin: 0.7 mg/dL (ref <1.2)
Total Protein: 7 g/dL (ref 6.5–8.1)

## 2023-11-02 LAB — PREGNANCY, URINE: Preg Test, Ur: NEGATIVE

## 2023-11-02 LAB — TYPE AND SCREEN
ABO/RH(D): A POS
Antibody Screen: NEGATIVE

## 2023-11-02 MED ORDER — ADDERALL XR 30 MG PO CP24: 30.0000 mg | ORAL_CAPSULE | Freq: Every day | ORAL | 0 refills | Status: DC

## 2023-11-02 MED ORDER — AMPHETAMINE-DEXTROAMPHETAMINE 10 MG PO TABS: 10.0000 mg | ORAL_TABLET | Freq: Every day | ORAL | 0 refills | Status: DC

## 2023-11-02 NOTE — Telephone Encounter (Signed)
Pt lvm that she needs a refill on her adderall xr 30 mg and adderall 10 mg. Pt thought 3 months of scripts would be sent in. Pharmacy is [facility] on [address] in [place]

## 2023-11-02 NOTE — Telephone Encounter (Signed)
Pended adderall xr 30 mg and adderall 10 mg to requested pharmacy.

## 2023-11-03 ENCOUNTER — Ambulatory Visit (HOSPITAL_BASED_OUTPATIENT_CLINIC_OR_DEPARTMENT_OTHER): Payer: 59 | Admitting: Certified Registered"

## 2023-11-03 ENCOUNTER — Encounter (HOSPITAL_COMMUNITY)
Admission: RE | Disposition: A | Discharge status: Home / Self Care | Payer: Self-pay | Source: Home / Self Care | Attending: Obstetrics & Gynecology

## 2023-11-03 ENCOUNTER — Ambulatory Visit (HOSPITAL_COMMUNITY)
Admission: RE | Admit: 2023-11-03 | Discharge: 2023-11-03 | Disposition: A | Discharge status: Home / Self Care | Payer: 59 | Attending: Obstetrics & Gynecology | Admitting: Obstetrics & Gynecology

## 2023-11-03 ENCOUNTER — Ambulatory Visit (HOSPITAL_COMMUNITY): Payer: 59 | Admitting: Certified Registered"

## 2023-11-03 ENCOUNTER — Encounter (HOSPITAL_COMMUNITY): Payer: Self-pay | Admitting: Obstetrics & Gynecology

## 2023-11-03 DIAGNOSIS — N8003 Adenomyosis of the uterus: Secondary | ICD-10-CM | POA: Diagnosis not present

## 2023-11-03 DIAGNOSIS — Z87891 Personal history of nicotine dependence: Secondary | ICD-10-CM | POA: Insufficient documentation

## 2023-11-03 DIAGNOSIS — N946 Dysmenorrhea, unspecified: Secondary | ICD-10-CM | POA: Diagnosis not present

## 2023-11-03 DIAGNOSIS — F319 Bipolar disorder, unspecified: Secondary | ICD-10-CM | POA: Diagnosis not present

## 2023-11-03 DIAGNOSIS — N888 Other specified noninflammatory disorders of cervix uteri: Secondary | ICD-10-CM | POA: Insufficient documentation

## 2023-11-03 DIAGNOSIS — N941 Unspecified dyspareunia: Secondary | ICD-10-CM | POA: Diagnosis not present

## 2023-11-03 DIAGNOSIS — N920 Excessive and frequent menstruation with regular cycle: Secondary | ICD-10-CM | POA: Diagnosis present

## 2023-11-03 DIAGNOSIS — N838 Other noninflammatory disorders of ovary, fallopian tube and broad ligament: Secondary | ICD-10-CM | POA: Insufficient documentation

## 2023-11-03 HISTORY — PX: ROBOTIC ASSISTED TOTAL HYSTERECTOMY WITH BILATERAL SALPINGO OOPHERECTOMY: SHX6086

## 2023-11-03 SURGERY — XI ROBOTIC ASSISTED TOTAL HYSTERECTOMY WITH BILATERAL SALPINGO OOPHORECTOMY
Anesthesia: General | Site: Abdomen | Laterality: Bilateral

## 2023-11-03 MED ORDER — CHLORHEXIDINE GLUCONATE 0.12 % MT SOLN
15.0000 mL | Freq: Once | OROMUCOSAL | Status: AC
  Administered 2023-11-03: 15 mL via OROMUCOSAL

## 2023-11-03 MED ORDER — PROPOFOL 10 MG/ML IV BOLUS
INTRAVENOUS | Status: DC | PRN
  Administered 2023-11-03: 50 mg via INTRAVENOUS
  Administered 2023-11-03: 150 mg via INTRAVENOUS

## 2023-11-03 MED ORDER — MIDAZOLAM HCL 2 MG/2ML IJ SOLN
INTRAMUSCULAR | Status: AC
Start: 2023-11-03 — End: ?
  Filled 2023-11-03: qty 2

## 2023-11-03 MED ORDER — KETOROLAC TROMETHAMINE 10 MG PO TABS: 10.0000 mg | ORAL_TABLET | Freq: Three times a day (TID) | ORAL | 0 refills | Status: DC | PRN

## 2023-11-03 MED ORDER — PHENYLEPHRINE 80 MCG/ML (10ML) SYRINGE FOR IV PUSH (FOR BLOOD PRESSURE SUPPORT)
PREFILLED_SYRINGE | INTRAVENOUS | Status: AC
  Filled 2023-11-03: qty 10

## 2023-11-03 MED ORDER — ONDANSETRON HCL 4 MG/2ML IJ SOLN: 4.0000 mg | Freq: Once | INTRAMUSCULAR | Status: DC | PRN

## 2023-11-03 MED ORDER — FENTANYL CITRATE (PF) 250 MCG/5ML IJ SOLN
INTRAMUSCULAR | Status: AC
  Filled 2023-11-03: qty 5

## 2023-11-03 MED ORDER — LACTATED RINGERS IV SOLN: INTRAVENOUS | Status: DC

## 2023-11-03 MED ORDER — PHENYLEPHRINE 80 MCG/ML (10ML) SYRINGE FOR IV PUSH (FOR BLOOD PRESSURE SUPPORT)
PREFILLED_SYRINGE | INTRAVENOUS | Status: DC | PRN
  Administered 2023-11-03: 80 ug via INTRAVENOUS

## 2023-11-03 MED ORDER — OXYCODONE HCL 5 MG/5ML PO SOLN: 5.0000 mg | Freq: Once | ORAL | Status: AC | PRN

## 2023-11-03 MED ORDER — BUPIVACAINE HCL 0.25 % IJ SOLN
INTRAMUSCULAR | Status: DC | PRN
  Administered 2023-11-03: 40 mL

## 2023-11-03 MED ORDER — ORAL CARE MOUTH RINSE: 15.0000 mL | Freq: Once | OROMUCOSAL | Status: AC

## 2023-11-03 MED ORDER — ONDANSETRON 8 MG PO TBDP: 8.0000 mg | ORAL_TABLET | Freq: Three times a day (TID) | ORAL | 0 refills | Status: DC | PRN

## 2023-11-03 MED ORDER — ROCURONIUM BROMIDE 10 MG/ML (PF) SYRINGE
PREFILLED_SYRINGE | INTRAVENOUS | Status: AC
  Filled 2023-11-03: qty 10

## 2023-11-03 MED ORDER — STERILE WATER FOR IRRIGATION IR SOLN
Status: DC | PRN
  Administered 2023-11-03: 1000 mL

## 2023-11-03 MED ORDER — DEXAMETHASONE SODIUM PHOSPHATE 10 MG/ML IJ SOLN
INTRAMUSCULAR | Status: AC
  Filled 2023-11-03: qty 1

## 2023-11-03 MED ORDER — ONDANSETRON HCL 4 MG/2ML IJ SOLN
INTRAMUSCULAR | Status: AC
  Filled 2023-11-03: qty 2

## 2023-11-03 MED ORDER — FENTANYL CITRATE PF 50 MCG/ML IJ SOSY
PREFILLED_SYRINGE | INTRAMUSCULAR | Status: AC
  Filled 2023-11-03: qty 1

## 2023-11-03 MED ORDER — FENTANYL CITRATE (PF) 250 MCG/5ML IJ SOLN
INTRAMUSCULAR | Status: DC | PRN
  Administered 2023-11-03: 50 ug via INTRAVENOUS
  Administered 2023-11-03: 25 ug via INTRAVENOUS
  Administered 2023-11-03: 75 ug via INTRAVENOUS
  Administered 2023-11-03 (×2): 25 ug via INTRAVENOUS
  Administered 2023-11-03: 50 ug via INTRAVENOUS

## 2023-11-03 MED ORDER — ACETAMINOPHEN 10 MG/ML IV SOLN
INTRAVENOUS | Status: AC
  Filled 2023-11-03: qty 100

## 2023-11-03 MED ORDER — KETOROLAC TROMETHAMINE 30 MG/ML IJ SOLN
30.0000 mg | Freq: Once | INTRAMUSCULAR | Status: AC
  Administered 2023-11-03: 30 mg via INTRAVENOUS
  Filled 2023-11-03: qty 1

## 2023-11-03 MED ORDER — FENTANYL CITRATE PF 50 MCG/ML IJ SOSY
25.0000 ug | PREFILLED_SYRINGE | INTRAMUSCULAR | Status: DC | PRN
  Administered 2023-11-03: 50 ug via INTRAVENOUS

## 2023-11-03 MED ORDER — CIPROFLOXACIN HCL 500 MG PO TABS: 500.0000 mg | ORAL_TABLET | Freq: Two times a day (BID) | ORAL | 0 refills | Status: DC

## 2023-11-03 MED ORDER — OXYCODONE-ACETAMINOPHEN 7.5-325 MG PO TABS: 1.0000 | ORAL_TABLET | Freq: Four times a day (QID) | ORAL | 0 refills | Status: DC | PRN

## 2023-11-03 MED ORDER — ONDANSETRON HCL 4 MG/2ML IJ SOLN
INTRAMUSCULAR | Status: DC | PRN
  Administered 2023-11-03: 4 mg via INTRAVENOUS

## 2023-11-03 MED ORDER — PROPOFOL 10 MG/ML IV BOLUS
INTRAVENOUS | Status: AC
  Filled 2023-11-03: qty 20

## 2023-11-03 MED ORDER — ACETAMINOPHEN 10 MG/ML IV SOLN
INTRAVENOUS | Status: DC | PRN
  Administered 2023-11-03: 1000 mg via INTRAVENOUS

## 2023-11-03 MED ORDER — BUPIVACAINE HCL (PF) 0.25 % IJ SOLN
INTRAMUSCULAR | Status: AC
  Filled 2023-11-03: qty 60

## 2023-11-03 MED ORDER — SUGAMMADEX SODIUM 200 MG/2ML IV SOLN
INTRAVENOUS | Status: DC | PRN
  Administered 2023-11-03: 116.2 mg via INTRAVENOUS

## 2023-11-03 MED ORDER — POVIDONE-IODINE 10 % EX SWAB: 2.0000 | Freq: Once | CUTANEOUS | Status: DC

## 2023-11-03 MED ORDER — OXYCODONE HCL 5 MG PO TABS
5.0000 mg | ORAL_TABLET | Freq: Once | ORAL | Status: AC | PRN
  Administered 2023-11-03: 5 mg via ORAL
  Filled 2023-11-03: qty 1

## 2023-11-03 MED ORDER — LIDOCAINE HCL (CARDIAC) PF 100 MG/5ML IV SOSY
PREFILLED_SYRINGE | INTRAVENOUS | Status: DC | PRN
  Administered 2023-11-03: 60 mg via INTRAVENOUS

## 2023-11-03 MED ORDER — MIDAZOLAM HCL 2 MG/2ML IJ SOLN
INTRAMUSCULAR | Status: DC | PRN
  Administered 2023-11-03: 2 mg via INTRAVENOUS

## 2023-11-03 MED ORDER — CEFAZOLIN SODIUM-DEXTROSE 2-4 GM/100ML-% IV SOLN
2.0000 g | INTRAVENOUS | Status: AC
  Administered 2023-11-03: 2 g via INTRAVENOUS
  Filled 2023-11-03: qty 100

## 2023-11-03 MED ORDER — LIDOCAINE HCL (PF) 2 % IJ SOLN
INTRAMUSCULAR | Status: AC
  Filled 2023-11-03: qty 5

## 2023-11-03 MED ORDER — DEXAMETHASONE SODIUM PHOSPHATE 10 MG/ML IJ SOLN
INTRAMUSCULAR | Status: DC | PRN
  Administered 2023-11-03: 10 mg via INTRAVENOUS

## 2023-11-03 MED ORDER — ROCURONIUM BROMIDE 100 MG/10ML IV SOLN
INTRAVENOUS | Status: DC | PRN
  Administered 2023-11-03 (×2): 50 mg via INTRAVENOUS

## 2023-11-03 SURGICAL SUPPLY — 48 items
ANTIFOG SOL W/FOAM PAD STRL (MISCELLANEOUS) ×1
BLADE SURG SZ11 CARB STEEL (BLADE) ×1
CAUTERY HOOK MNPLR 1.6 DVNC XI (INSTRUMENTS) ×1
COVER LIGHT HANDLE STERIS (MISCELLANEOUS) ×2
COVER MAYO STAND XLG (MISCELLANEOUS) ×1
DERMABOND ADVANCED .7 DNX12 (GAUZE/BANDAGES/DRESSINGS) ×4
DRAPE ARM DVNC X/XI (DISPOSABLE) ×4
DRAPE COLUMN DVNC XI (DISPOSABLE) ×1
DRIVER NDL MEGA SUTCUT DVNCXI (INSTRUMENTS) IMPLANT
DRIVER NDLE MEGA SUTCUT DVNCXI (INSTRUMENTS) ×1
ELECT REM PT RETURN 9FT ADLT (ELECTROSURGICAL) ×1
FORCEPS PROGRASP DVNC XI (FORCEP) ×1
GAUZE 4X4 16PLY ~~LOC~~+RFID DBL (SPONGE) ×2
GLOVE BIOGEL PI IND STRL 7.0 (GLOVE) ×4
GLOVE BIOGEL PI IND STRL 8 (GLOVE) ×2
GLOVE SURG SS PI 6.5 STRL IVOR (GLOVE) ×3
GLOVE SURG SS PI 7.0 STRL IVOR (GLOVE) ×2
GLOVE SURG SS PI 8.0 STRL IVOR (GLOVE) ×3
GOWN STRL REUS W/TWL LRG LVL3 (GOWN DISPOSABLE) ×2
GOWN STRL REUS W/TWL XL LVL3 (GOWN DISPOSABLE) ×2
GYRUS RUMI II 4.0CM BLUE (DISPOSABLE) ×1
KIT PINK PAD W/HEAD ARE REST (MISCELLANEOUS) ×1
KIT TURNOVER CYSTO (KITS) ×1
MANIFOLD NEPTUNE II (INSTRUMENTS) ×1
NDL HYPO 21X1.5 SAFETY (NEEDLE) ×1 IMPLANT
NDL INSUFFLATION 14GA 120MM (NEEDLE) ×1 IMPLANT
NEEDLE HYPO 21X1.5 SAFETY (NEEDLE) ×1
NEEDLE INSUFFLATION 14GA 120MM (NEEDLE) ×1
NS IRRIG 500ML POUR BTL (IV SOLUTION) ×1
OBTURATOR OPTICAL STND 8 DVNC (TROCAR) ×1
PACK PERI GYN (CUSTOM PROCEDURE TRAY) ×1
SEAL UNIV 5-12 XI (MISCELLANEOUS) ×3
SEALER VESSEL EXT DVNC XI (MISCELLANEOUS) ×1
SET BASIN LINEN APH (SET/KITS/TRAYS/PACK) ×1
SET TUBE IRRIG SUCTION NO TIP (IRRIGATION / IRRIGATOR) ×1
SPONGE T-LAP 18X18 ~~LOC~~+RFID (SPONGE) ×1
SUT ETHILON 3 0 FSL (SUTURE) ×1
SUT STRATAFIX 0 PDS+ CT-2 23 (SUTURE) ×1
SUT VIC AB 3-0 SH 27X BRD (SUTURE) ×1
SUT VICRYL 0 AB UR-6 (SUTURE) ×1
SUT VICRYL AB 3-0 FS1 BRD 27IN (SUTURE) ×2
SYR 10ML LL (SYRINGE) ×2
SYR 50ML LL SCALE MARK (SYRINGE) ×2
SYR CONTROL 10ML LL (SYRINGE) ×2
TIP UTERINE 6.7X8CM BLUE DISP (MISCELLANEOUS) ×1
TRAY FOLEY SLVR 16FR LF STAT (SET/KITS/TRAYS/PACK) ×1
TROCAR KII 8X100ML NONTHREADED (TROCAR) ×1
WATER STERILE IRR 500ML POUR (IV SOLUTION) ×2

## 2023-11-03 NOTE — Anesthesia Procedure Notes (Signed)
Procedure Name: Intubation Date/Time: 11/03/2023 12:48 PM  Performed by: Cy Blamer, CRNAPre-anesthesia Checklist: Patient identified, Emergency Drugs available, Suction available and Patient being monitored Patient Re-evaluated:Patient Re-evaluated prior to induction Oxygen Delivery Method: Circle system utilized Preoxygenation: Pre-oxygenation with 100% oxygen Induction Type: IV induction Ventilation: Mask ventilation without difficulty Laryngoscope Size: Miller and 2 Grade View: Grade I Tube type: Oral Tube size: 6.5 mm Number of attempts: 1 Airway Equipment and Method: Stylet and Bite block Placement Confirmation: ETT inserted through vocal cords under direct vision, positive ETCO2 and breath sounds checked- equal and bilateral Secured at: 21 cm Tube secured with: Tape Dental Injury: Teeth and Oropharynx as per pre-operative assessment

## 2023-11-03 NOTE — Anesthesia Preprocedure Evaluation (Signed)
 Anesthesia Evaluation  Patient identified by MRN, date of birth, ID band Patient awake    Reviewed: Allergy & Precautions, NPO status , Patient's Chart, lab work & pertinent test results  History of Anesthesia Complications Negative for: history of anesthetic complications  Airway Mallampati: I  TM Distance: >3 FB Neck ROM: Full    Dental no notable dental hx. (+) Dental Advisory Given, Teeth Intact   Pulmonary shortness of breath, Patient abstained from smoking., former smoker          Cardiovascular Exercise Tolerance: Good hypertension (gestational ),      Neuro/Psych  Headaches PSYCHIATRIC DISORDERS Anxiety Depression Bipolar Disorder      GI/Hepatic Neg liver ROS,GERD  Medicated,,  Endo/Other  negative endocrine ROS    Renal/GU negative Renal ROS     Musculoskeletal negative musculoskeletal ROS (+)    Abdominal   Peds  (+) ADHD Hematology negative hematology ROS (+)   Anesthesia Other Findings   Reproductive/Obstetrics negative OB ROS                             Anesthesia Physical Anesthesia Plan  ASA: 2  Anesthesia Plan: General   Post-op Pain Management: Dilaudid IV   Induction: Intravenous  PONV Risk Score and Plan: Ondansetron, Dexamethasone and Midazolam  Airway Management Planned: Oral ETT  Additional Equipment: None  Intra-op Plan:   Post-operative Plan: Extubation in OR  Informed Consent: I have reviewed the patients History and Physical, chart, labs and discussed the procedure including the risks, benefits and alternatives for the proposed anesthesia with the patient or authorized representative who has indicated his/her understanding and acceptance.     Dental advisory given  Plan Discussed with: CRNA  Anesthesia Plan Comments:         Anesthesia Quick Evaluation

## 2023-11-03 NOTE — Op Note (Signed)
Preoperative diagnosis: Menorrhagia Dysmenorrhea Dyspareunia, bump, >50%   Postoperative diagnosis: SAA   Procedure: Xi Robotic hysterectomy with bilateral salpingectomy  Laparoscopic guided transversus abdominus plane block   Surgeon: Lazaro Arms, MD   Anesthesia: General endotracheal   Findings: Normal uterus tubes and ovaries   Description of operation: Patient was taken to the operating room and placed in the low lithotomy position She was prepped and draped in the usual sterile fashion robotic assisted laparoscopic procedure A Foley catheter was placed The vagina was once again prepped additionally   A RUMI II 8 cm with 4 cervical cup was placed for uterine manipulation and colpotomy delineation   An incision was made above the umbilicus The umbilical fascia was grasped A Veres needle was used and placed into the peritoneum with 1 pass A pneumoperitoneum was created to a pressure of 15   An 8 mm port was placed into the peritoneal cavity using a nonbladed trocar easily with 1 pass using the video laparoscope The peritoneal cavity was confirmed   There were no unusual findings    3 additional 8 mm ports were placed at approximately the same level as the supraumbilical port 2 of the ports were robotic and 1 is an assist port   One was left lateral and 1 was placed right lateral, both lateral to the rectus anterior muscle These were placed under direct visualization without difficulty into the peritoneal cavity Nonbladed trocars were used in all instances    The patient was placed in 24 degrees of Trendelenburg   The [employer] robot was then docked to the 3 robotic ports   Instruments used during the robotic hysterectomy: Vessel sealer extender using bipolar energy ProGrasp forceps with no energy Monopolar hook Megacut needle driver 2-0 PDS symmetrical STRATAFIX on a CT 2 needle   I then left the patient and went to the surgical console   The RUMI  II  was used throughout the case to apply traction and anterior/posterior displacement of the uterus to facilitate the robotic procedure The ProGrasp was used to put traction on the left adnexa The left ureter was identified and found to be well away from the adnexal vessels The vessel sealer extender using bipolar energy was used and the utero-ovarian ligament was coagulated and then transected I used traction medially and anteriorly and used the vessel sealer to take the broad ligament and round ligament down to the level of the cervical isthmus just lateral to the left uterine vessels   I then turned my attention to the right adnexa The pro grasp was used and medial and upward traction was placed The vessel sealer extender using bipolar energy was used to coagulate and ligate the right infundibulopelvic ligament vessels Again the right ureter was well inferior to the vessels I continued use medial and anterior retraction using the ProGrasp and the vessel sealer with the bipolar energy was used to take the broad ligament and round ligament down to the level of the cervical isthmus and lateral to the uterine vessels   I then placed the monopolar hook in the place of the ProGrasp The vessel sealer extender was used to grasp the peritoneum of the bladder provide traction, of course no energy was used for this I used the monopolar hook with energy to open of the peritoneal leaf anteriorly and dissect the bladder peritoneum off the lower uterine segment and cervix beyond the level of the vaginal colpotomy cup I then used the monopolar hook  to take the peritoneum down where I stopped using the vessel sealer and met in the bladder dissection bilaterally   The vessel sealer extender with monopolar energy was used to coagulate the uterine vessels at the level of the cervical isthmus bilaterally and then just above and just below to manage backbleeding The uterine vessels were transected There was good  hemostasis I then stayed medial to this uterine vessel pedicle with the vessel sealer extender and took down the paracervical tissue to allow colpotomy incision using the monopolar hook, preserving the cardinal ligament Again there was good hemostasis bilaterally   I then used the monopolar hook and made a posterior colpotomy incision above the level of insertion of the uterosacral ligaments I used a frowny face then smiley face technique and opened the vagina to approximately 8:00 and 4:00 posteriorly I then used the vessel sealer extender with bipolar energy, coagulating and then transecting the vagina   The monopolar hook was used to make the anterior colpotomy incision as the bladder had been dissected well past the colpotomy ring Cephalad tension was placed on the Vcare handle throughout this portion of the procedure to prevent thermal injury to the bladder and safe distance from the ureters bilaterally Colpotomy incision was made from 10:00 to 2:00 The vessel sealer with bipolar energy was then used to coagulate and transect the vagina, again inside of the cardinal ligament   The uterus was removed through the vagina   The Fallopian tubes were also removed through the vagina A wet lap tape was placed in the vagina to maintain pneumoperitoneum   All pedicles were found to be hemostatic there was very little blood loss I then removed the vessel sealer and monopolar hook The pro grasp was replaced and the needle driver was also placed along with the suture   The vagina was closed using the 2-0 PDS STRATAFIX symmetrical suture on the CT 2 needle I pay close attention to the vaginal corners bilaterally and incorporated those in the closure 1.5 cm of vagina was operating to the vaginal closure I also fixed the uterosacral ligaments to the vaginal cuff repair shortening the uterosacral ligaments thus providing improved vaginal vault suspension The cardinal ligaments were intact again  improving postoperative vaginal support Pubocervical rectovaginal and posterior peritoneum were all included in the vaginal closure There was good result hemostasis   At the end of the procedure all the pedicles were identified and found to be hemostatic Both ureters were identified and undergoing normal peristalsis, they were well out of the way of surgical field throughout   I then got up from the console and went back to the side of the patient after gowning and gloving sterilely  A transversus abdominus plane block was placed using laparoscopic guidance at T10 and T7 bilaterally, 10 cc of 0.25% bupivacaine at each site, 40 cc total of 100 mg of bupivacaine.  Doyle's bubble technique was used.       All the ports were removed The insufflation ports were used to actively desufflate the peritoneal cavity to a pressure of 0 Ports were then removed   The subcutaneous tissue of all 4 incisions was closed using 0 Vicryl All 4 skin incisions were closed using 3-0 vicryl in a subcuticular manner Dermabond was used all 4 incision sites  Each incision was dressed   The patient tolerated the procedure well She received 2 g of Ancef and 30 mg of Toradol preoperatively   EBL: 50 cc   Omelia Blackwater  Lauretta Chester, MD 11/03/2023 3:01 PM

## 2023-11-03 NOTE — Transfer of Care (Signed)
Immediate Anesthesia Transfer of Care Note  Patient: Cristina Davenport  Procedure(s) Performed: XI ROBOTIC ASISTED TOTAL HYSTERECTMY WITH BILATERAL SALPINGECTOMY (Bilateral: Abdomen)  Patient Location: PACU  Anesthesia Type:General  Level of Consciousness: awake, alert , and oriented  Airway & Oxygen Therapy: Patient Spontanous Breathing and Patient connected to nasal cannula oxygen  Post-op Assessment: Report given to RN, Post -op Vital signs reviewed and stable, Patient moving all extremities X 4, and Patient able to stick tongue midline  Post vital signs: Reviewed and stable  Last Vitals:  Vitals Value Taken Time  BP 126/73 11/03/23 1509  Temp 98.4   Pulse 82 11/03/23 1511  Resp 13 11/03/23 1511  SpO2 100 % 11/03/23 1511  Vitals shown include unfiled device data.  Last Pain:  Vitals:   11/03/23 1054  TempSrc: Oral  PainSc: 0-No pain      Patients Stated Pain Goal: 5 (11/03/23 1054)  Complications: No notable events documented.

## 2023-11-03 NOTE — H&P (Signed)
Preoperative History and Physical  Cristina Davenport Cristina Davenport is a 37 y.o. [phone] with No LMP recorded. admitted for a RA TLH + BS.  Pt has 7-9 day cycles very heavy + painful/crampy even with IUD in place Removed IUD started pills with minimal improvement Bump dyspareunia >50%, not insertional Completed her family, does not want to try other conservative measures, IUD was not helpful Dyspareunia will persist with cervix in place   PMH:    Past Medical History:  Diagnosis Date   Anxiety    Anxiety    Bipolar 1 disorder (HCC)    Depression    Family history of adverse reaction to anesthesia    GERD (gastroesophageal reflux disease)    IBS (irritable bowel syndrome)    Migraine without aura     PSH:     Past Surgical History:  Procedure Laterality Date   ABDOMINAL SURGERY     cyst removed    CHOLECYSTECTOMY N/A 07/10/2013   Procedure: LAPAROSCOPIC CHOLECYSTECTOMY;  Surgeon: Fabio Bering, MD;  Location: [facility];  Service: General;  Laterality: N/A;   ESOPHAGOGASTRODUODENOSCOPY N/A 04/30/2013   [id number] distal esophagus of uncertain clinical significance-status post biopsy. Tiny hiatal hernia. No explanation   EXAMINATION UNDER ANESTHESIA  04/21/2012   Gluteal wound repair   INCISIONAL HERNIA REPAIR N/A 04/06/2021   Procedure: INCISIONAL HERNIORRHAPHY;  Surgeon: Franky Macho, MD;  Location: [facility];  Service: General;  Laterality: N/A;   WISDOM TOOTH EXTRACTION      POb/GynH:      OB History     Gravida  4   Para  3   Term  2   Preterm  1   AB  1   Living  3      SAB  1   IAB  0   Ectopic  0   Multiple  0   Live Births  3           SH:   Social History   Tobacco Use   Smoking status: Former    Current packs/day: 0.00    Average packs/day: 0.5 packs/day for 8.0 years (4.0 ttl pk-yrs)    Types: Cigarettes    Start date: 09/19/2006    Quit date: 09/19/2014    Years since quitting: 9.1   Smokeless tobacco: Never  Vaping Use   Vaping status: Every Day    Substances: Nicotine, Flavoring  Substance Use Topics   Alcohol use: Yes    Alcohol/week: 3.0 standard drinks of alcohol    Types: 3 Glasses of wine per week    Comment: scoially   Drug use: Yes    Types: Marijuana    Comment: for pain in the back    FH:    Family History  Adopted: Yes  Problem Relation Age of Onset   Mental illness Mother    Cancer Father        skin cancer   Hypertension Maternal Grandmother    Stroke Maternal Grandmother    Kidney failure Maternal Grandmother    Hypertension Maternal Grandfather    Stroke Maternal Grandfather    Cancer Maternal Grandfather        skin   Crohn's disease Cousin    Colon cancer Neg Hx    Rectal cancer Neg Hx    Stomach cancer Neg Hx      Allergies:  Allergies  Allergen Reactions   Latex Other (See Comments)    General irritation   Doxycycline Rash  Solar rash    Medications:      No current facility-administered medications for this encounter.  Review of Systems:   Review of Systems  Constitutional: Negative for fever, chills, weight loss, malaise/fatigue and diaphoresis.  HENT: Negative for hearing loss, ear pain, nosebleeds, congestion, sore throat, neck pain, tinnitus and ear discharge.   Eyes: Negative for blurred vision, double vision, photophobia, pain, discharge and redness.  Respiratory: Negative for cough, hemoptysis, sputum production, shortness of breath, wheezing and stridor.   Cardiovascular: Negative for chest pain, palpitations, orthopnea, claudication, leg swelling and PND.  Gastrointestinal: Positive for abdominal pain. Negative for heartburn, nausea, vomiting, diarrhea, constipation, blood in stool and melena.  Genitourinary: Negative for dysuria, urgency, frequency, hematuria and flank pain.  Musculoskeletal: Negative for myalgias, back pain, joint pain and falls.  Skin: Negative for itching and rash.  Neurological: Negative for dizziness, tingling, tremors, sensory change, speech change,  focal weakness, seizures, loss of consciousness, weakness and headaches.  Endo/Heme/Allergies: Negative for environmental allergies and polydipsia. Does not bruise/bleed easily.  Psychiatric/Behavioral: Negative for depression, suicidal ideas, hallucinations, memory loss and substance abuse. The patient is not nervous/anxious and does not have insomnia.      PHYSICAL EXAM:  There were no vitals taken for this visit.    Vitals reviewed. Constitutional: She is oriented to person, place, and time. She appears well-developed and well-nourished.  HENT:  Head: Normocephalic and atraumatic.  Right Ear: External ear normal.  Left Ear: External ear normal.  Nose: Nose normal.  Mouth/Throat: Oropharynx is clear and moist.  Eyes: Conjunctivae and EOM are normal. Pupils are equal, round, and reactive to light. Right eye exhibits no discharge. Left eye exhibits no discharge. No scleral icterus.  Neck: Normal range of motion. Neck supple. No tracheal deviation present. No thyromegaly present.  Cardiovascular: Normal rate, regular rhythm, normal heart sounds and intact distal pulses.  Exam reveals no gallop and no friction rub.   No murmur heard. Respiratory: Effort normal and breath sounds normal. No respiratory distress. She has no wheezes. She has no rales. She exhibits no tenderness.  GI: Soft. Bowel sounds are normal. She exhibits no distension and no mass. There is tenderness. There is no rebound and no guarding.  Genitourinary:       Vulva is normal without lesions Vagina is pink moist without discharge Cervix normal in appearance and pap is normal Uterus is normal size, contour, position, consistency, mobility, non-tender Adnexa is negative with normal sized ovaries by sonogram  Musculoskeletal: Normal range of motion. She exhibits no edema and no tenderness.  Neurological: She is alert and oriented to person, place, and time. She has normal reflexes. She displays normal reflexes. No cranial  nerve deficit. She exhibits normal muscle tone. Coordination normal.  Skin: Skin is warm and dry. No rash noted. No erythema. No pallor.  Psychiatric: She has a normal mood and affect. Her behavior is normal. Judgment and thought content normal.    Labs: Results for orders placed or performed during the hospital encounter of 11/02/23 (from the past 2 weeks)  CBC   Collection Time: 11/02/23 11:35 AM  Result Value Ref Range   WBC 7.3 4.0 - 10.5 K/uL   RBC 4.64 3.87 - 5.11 MIL/uL   Hemoglobin 14.0 12.0 - 15.0 g/dL   HCT 40.9 81.1 - 91.4 %   MCV 90.3 80.0 - 100.0 fL   MCH 30.2 26.0 - 34.0 pg   MCHC 33.4 30.0 - 36.0 g/dL   RDW 78.2 95.6 -  15.5 %   Platelets 276 150 - 400 K/uL   nRBC 0.0 0.0 - 0.2 %  Comprehensive metabolic panel   Collection Time: 11/02/23 11:35 AM  Result Value Ref Range   Sodium 136 135 - 145 mmol/L   Potassium 3.3 (L) 3.5 - 5.1 mmol/L   Chloride 102 98 - 111 mmol/L   CO2 23 22 - 32 mmol/L   Glucose, Bld 92 70 - 99 mg/dL   BUN 8 6 - 20 mg/dL   Creatinine, Ser 8.11 0.44 - 1.00 mg/dL   Calcium 8.9 8.9 - 91.4 mg/dL   Total Protein 7.0 6.5 - 8.1 g/dL   Albumin 4.3 3.5 - 5.0 g/dL   AST 17 15 - 41 U/L   ALT 16 0 - 44 U/L   Alkaline Phosphatase 43 38 - 126 U/L   Total Bilirubin 0.7 <1.2 mg/dL   GFR, Estimated >78 >29 mL/min   Anion gap 11 5 - 15  Rapid HIV screen (HIV 1/2 Ab+Ag)   Collection Time: 11/02/23 11:35 AM  Result Value Ref Range   HIV-1 P24 Antigen - HIV24 NON REACTIVE NON REACTIVE   HIV 1/2 Antibodies NON REACTIVE NON REACTIVE   Interpretation (HIV Ag Ab)      A non reactive test result means that HIV 1 or HIV 2 antibodies and HIV 1 p24 antigen were not detected in the specimen.  Urinalysis, Routine w reflex microscopic -Urine, Clean Catch   Collection Time: 11/02/23 11:35 AM  Result Value Ref Range   Color, Urine YELLOW YELLOW   APPearance CLEAR CLEAR   Specific Gravity, Urine 1.021 1.005 - 1.030   pH 7.0 5.0 - 8.0   Glucose, UA NEGATIVE  NEGATIVE mg/dL   Hgb urine dipstick NEGATIVE NEGATIVE   Bilirubin Urine NEGATIVE NEGATIVE   Ketones, ur NEGATIVE NEGATIVE mg/dL   Protein, ur NEGATIVE NEGATIVE mg/dL   Nitrite NEGATIVE NEGATIVE   Leukocytes,Ua NEGATIVE NEGATIVE  Pregnancy, urine   Collection Time: 11/02/23 11:35 AM  Result Value Ref Range   Preg Test, Ur NEGATIVE NEGATIVE  Type and screen   Collection Time: 11/02/23 11:35 AM  Result Value Ref Range   ABO/RH(D) A POS    Antibody Screen NEG    Sample Expiration 11/16/2023,2359    Extend sample reason      NO TRANSFUSIONS OR PREGNANCY IN THE PAST 3 MONTHS Performed at [facility], [address] [place], [place] [address]     EKG: Orders placed or performed during the hospital encounter of 03/28/21   EKG 12-Lead   EKG 12-Lead   EKG    Imaging Studies: No results found.    Assessment: Menorrhagia with regular cycle Dysmenorrhea Dyspareunia, bump, >50%  Plan: RA TLH + BS(vaginal vault suspension)  Pt understands the risks of surgery including but not limited t  excessive bleeding requiring transfusion or reoperation, post-operative infection requiring prolonged hospitalization or re-hospitalization and antibiotic therapy, and damage to other organs including bladder, bowel, ureters and major vessels.  The patient also understands the alternative treatment options which were discussed in full.  All questions were answered.  Amaryllis Dyke Karlis Cregg 11/03/2023 10:14 AM   Amaryllis Dyke Rianne Degraaf 11/03/2023 10:06 AM

## 2023-11-07 LAB — SURGICAL PATHOLOGY

## 2023-11-07 NOTE — Anesthesia Postprocedure Evaluation (Signed)
Anesthesia Post Note  Patient: Cristina Davenport  Procedure(s) Performed: XI ROBOTIC ASISTED TOTAL HYSTERECTMY WITH BILATERAL SALPINGECTOMY (Bilateral: Abdomen)  Patient location during evaluation: Phase II Anesthesia Type: General Level of consciousness: awake Pain management: pain level controlled Vital Signs Assessment: post-procedure vital signs reviewed and stable Respiratory status: spontaneous breathing and respiratory function stable Cardiovascular status: blood pressure returned to baseline and stable Postop Assessment: no headache and no apparent nausea or vomiting Anesthetic complications: no Comments: Late entry   No notable events documented.   Last Vitals:  Vitals:   11/03/23 1600 11/03/23 1615  BP: 114/73 122/78  Pulse: 73 81  Resp: 11 12  Temp:  37 C  SpO2: 100% 100%    Last Pain:  Vitals:   11/03/23 1615  TempSrc: Oral  PainSc: 5                  Windell Norfolk

## 2023-11-08 ENCOUNTER — Encounter (HOSPITAL_COMMUNITY): Payer: Self-pay | Admitting: Obstetrics & Gynecology

## 2023-11-10 ENCOUNTER — Encounter: Payer: 59 | Admitting: Obstetrics & Gynecology

## 2023-11-10 NOTE — Telephone Encounter (Signed)
Error

## 2023-11-14 ENCOUNTER — Ambulatory Visit (INDEPENDENT_AMBULATORY_CARE_PROVIDER_SITE_OTHER): Payer: 59 | Admitting: Obstetrics & Gynecology

## 2023-11-14 ENCOUNTER — Encounter: Payer: Self-pay | Admitting: Obstetrics & Gynecology

## 2023-11-14 VITALS — BP 118/77 | HR 107

## 2023-11-14 DIAGNOSIS — Z9889 Other specified postprocedural states: Secondary | ICD-10-CM

## 2023-11-14 NOTE — Progress Notes (Signed)
 HPI: Patient returns for routine postoperative follow-up having undergone RA TLH + BS on 11/03/23.  The patient's immediate postoperative recovery has been unremarkable. Since hospital discharge the patient reports no problems.   Current Outpatient Medications: ADDERALL  XR 30 MG 24 hr capsule, Take 1 capsule (30 mg total) by mouth daily., Disp: 30 capsule, Rfl: 0 amphetamine -dextroamphetamine  (ADDERALL ) 10 MG tablet, Take 1 tablet (10 mg total) by mouth daily., Disp: 30 tablet, Rfl: 0 ASHWAGANDHA PO, Take 2 capsules by mouth in the morning., Disp: , Rfl:  B Complex-C (SUPER B COMPLEX PO), Take 1 capsule by mouth in the morning., Disp: , Rfl:  clonazePAM  (KLONOPIN ) 0.5 MG tablet, Take 1 tablet (0.5 mg total) by mouth 2 (two) times daily as needed for anxiety., Disp: 60 tablet, Rfl: 2 folic acid (FOLVITE) 800 MCG tablet, Take 400 mcg by mouth daily in the afternoon., Disp: , Rfl:  Inositol Niacinate 500 MG TABS, Take 500 mg by mouth in the morning., Disp: , Rfl:  L-LYSINE PO, Take 1 capsule by mouth in the morning., Disp: , Rfl:  L-THEANINE PO, Take 1 tablet by mouth in the morning., Disp: , Rfl:  Magnesium  400 MG TABS, Take 400 mg by mouth every evening., Disp: , Rfl:  Moringa Oleifera (MORINGA PO), Take 2 tablets by mouth every evening., Disp: , Rfl:  Multiple Vitamins-Minerals (HAIR SKIN NAILS PO), Take 1 tablet by mouth in the morning., Disp: , Rfl:  Multiple Vitamins-Minerals (ZINC PO), Take 1 tablet by mouth every evening., Disp: , Rfl:  OVER THE COUNTER MEDICATION, Take 1 capsule by mouth in the morning. [place], Disp: , Rfl:  Probiotic Product (DIGESTIVE ADV PREBIOT+PROBIOT PO), Take 1 capsule by mouth in the morning., Disp: , Rfl:  Prucalopride Succinate  (MOTEGRITY ) 2 MG TABS, Take 1 tablet (2 mg total) by mouth daily. NEEDS OFFICE VISIT FOR FURTHER REFILLS, Disp: 90 tablet, Rfl: 0 sertraline  (ZOLOFT ) 100 MG tablet, TAKE 2 TABLETS BY MOUTH EVERY DAY (Patient taking differently:  Take 200 mg by mouth at bedtime.), Disp: 180 tablet, Rfl: 1 silver  sulfADIAZINE  (SILVADENE ) 1 % cream, Apply to area 3 times daily (Patient taking differently: Apply 1 Application topically 3 (three) times daily as needed (irritation.).), Disp: 50 g, Rfl: 11 Simethicone  250 MG CAPS, Take 250 mg by mouth in the morning., Disp: , Rfl:   tretinoin (RETIN-A) 0.05 % cream, Apply 1 Application topically 3 (three) times a week., Disp: , Rfl:  TURMERIC CURCUMIN PO, Take 1 capsule by mouth every evening. W/Ginger Powder, Disp: , Rfl:  ciprofloxacin  (CIPRO ) 500 MG tablet, Take 1 tablet (500 mg total) by mouth 2 (two) times daily. (Patient not taking: Reported on 11/14/2023), Disp: 14 tablet, Rfl: 0 ketorolac  (TORADOL ) 10 MG tablet, Take 1 tablet (10 mg total) by mouth every 8 (eight) hours as needed. (Patient not taking: Reported on 11/14/2023), Disp: 15 tablet, Rfl: 0 lamoTRIgine  (LAMICTAL ) 100 MG tablet, Take one tablet at bedtime. (Patient taking differently: Take 100 mg by mouth daily.), Disp: 30 tablet, Rfl: 5 ondansetron  (ZOFRAN -ODT) 8 MG disintegrating tablet, Take 1 tablet (8 mg total) by mouth every 8 (eight) hours as needed for nausea or vomiting. (Patient not taking: Reported on 11/14/2023), Disp: 8 tablet, Rfl: 0 oxyCODONE -acetaminophen  (PERCOCET) 7.5-325 MG tablet, Take 1 tablet by mouth every 6 (six) hours as needed. (Patient not taking: Reported on 11/14/2023), Disp: 28 tablet, Rfl: 0 trichloroacetic acid 80 % LIQD, Dispense for in office use (Patient not taking: Reported on 09/19/2023), Disp: 15 mL, Rfl: 0  No current facility-administered medications for this visit.    Blood pressure 118/77, pulse (!) 107.  Physical Exam: Normal abdominal exam, healing well  Diagnostic Tests:   Pathology: benign  Impression + Management plan:   ICD-10-CM   1. Post-operative state: RA TLH + BS  Z98.890           Medications Prescribed this encounter: No orders of the defined types were  placed in this encounter.     Follow up: No follow-ups on file.    Vonn VEAR Inch, MD Attending Physician for the Center for [facility] and [facility] 11/14/2023 12:26 PM

## 2023-12-12 ENCOUNTER — Other Ambulatory Visit: Payer: Self-pay

## 2023-12-12 ENCOUNTER — Telehealth: Payer: Self-pay | Admitting: Physician Assistant

## 2023-12-12 DIAGNOSIS — F909 Attention-deficit hyperactivity disorder, unspecified type: Secondary | ICD-10-CM

## 2023-12-12 MED ORDER — AMPHETAMINE-DEXTROAMPHETAMINE 10 MG PO TABS: 10.0000 mg | ORAL_TABLET | Freq: Every day | ORAL | 0 refills | Status: DC

## 2023-12-12 MED ORDER — ADDERALL XR 30 MG PO CP24: 30.0000 mg | ORAL_CAPSULE | Freq: Every day | ORAL | 0 refills | Status: DC

## 2023-12-12 NOTE — Telephone Encounter (Signed)
Please schedule pt an appt. Lv 11/20 due back in 3 months

## 2023-12-12 NOTE — Telephone Encounter (Signed)
Pended Adderall 20 mg and adderall 10 mg to [facility].

## 2023-12-12 NOTE — Telephone Encounter (Signed)
Pt lvm that she  needs refills on her adderall xr 30 and adderall 10 mg. Pharmacy is [facility] on [address] in [place]

## 2023-12-23 ENCOUNTER — Other Ambulatory Visit: Payer: Self-pay | Admitting: Gastroenterology

## 2023-12-26 ENCOUNTER — Encounter: Payer: Self-pay | Admitting: Obstetrics & Gynecology

## 2023-12-26 ENCOUNTER — Ambulatory Visit: Payer: 59 | Admitting: Obstetrics & Gynecology

## 2023-12-26 VITALS — BP 111/67 | HR 89 | Ht 61.0 in | Wt 125.0 lb

## 2023-12-26 DIAGNOSIS — Z9889 Other specified postprocedural states: Secondary | ICD-10-CM

## 2023-12-26 DIAGNOSIS — Z4889 Encounter for other specified surgical aftercare: Secondary | ICD-10-CM

## 2023-12-26 NOTE — Progress Notes (Signed)
HPI: Patient returns for routine postoperative follow-up having undergone RA TLH + BS on 11/03/23.  The patient's immediate postoperative recovery has been unremarkable. Since hospital discharge the patient reports doing well.   Current Outpatient Medications: ADDERALL XR 30 MG 24 hr capsule, Take 1 capsule (30 mg total) by mouth daily., Disp: 30 capsule, Rfl: 0 amphetamine-dextroamphetamine (ADDERALL) 10 MG tablet, Take 1 tablet (10 mg total) by mouth daily., Disp: 30 tablet, Rfl: 0 ASHWAGANDHA PO, Take 2 capsules by mouth in the morning., Disp: , Rfl:  B Complex-C (SUPER B COMPLEX PO), Take 1 capsule by mouth in the morning., Disp: , Rfl:  clonazePAM (KLONOPIN) 0.5 MG tablet, Take 1 tablet (0.5 mg total) by mouth 2 (two) times daily as needed for anxiety., Disp: 60 tablet, Rfl: 2 folic acid (FOLVITE) 800 MCG tablet, Take 400 mcg by mouth daily in the afternoon., Disp: , Rfl:  Inositol Niacinate 500 MG TABS, Take 500 mg by mouth in the morning., Disp: , Rfl:  L-LYSINE PO, Take 1 capsule by mouth in the morning., Disp: , Rfl:  L-THEANINE PO, Take 1 tablet by mouth in the morning., Disp: , Rfl:  Magnesium 400 MG TABS, Take 400 mg by mouth every evening., Disp: , Rfl:  Moringa Oleifera (MORINGA PO), Take 2 tablets by mouth every evening., Disp: , Rfl:  Multiple Vitamins-Minerals (HAIR SKIN NAILS PO), Take 1 tablet by mouth in the morning., Disp: , Rfl:  Multiple Vitamins-Minerals (ZINC PO), Take 1 tablet by mouth every evening., Disp: , Rfl:  OVER THE COUNTER MEDICATION, Take 1 capsule by mouth in the morning. Lion's Mane, Disp: , Rfl:  Probiotic Product (DIGESTIVE ADV PREBIOT+PROBIOT PO), Take 1 capsule by mouth in the morning., Disp: , Rfl:  Prucalopride Succinate (MOTEGRITY) 2 MG TABS, Take 1 tablet (2 mg total) by mouth daily. NEEDS OFFICE VISIT FOR FURTHER REFILLS, Disp: 90 tablet, Rfl: 0 sertraline (ZOLOFT) 100 MG tablet, TAKE 2 TABLETS BY MOUTH EVERY DAY (Patient taking differently:  Take 200 mg by mouth at bedtime.), Disp: 180 tablet, Rfl: 1 silver sulfADIAZINE (SILVADENE) 1 % cream, Apply to area 3 times daily (Patient taking differently: Apply 1 Application topically 3 (three) times daily as needed (irritation.).), Disp: 50 g, Rfl: 11 Simethicone 250 MG CAPS, Take 250 mg by mouth in the morning., Disp: , Rfl:   tretinoin (RETIN-A) 0.05 % cream, Apply 1 Application topically 3 (three) times a week., Disp: , Rfl:  TURMERIC CURCUMIN PO, Take 1 capsule by mouth every evening. W/Ginger Powder, Disp: , Rfl:  ciprofloxacin (CIPRO) 500 MG tablet, Take 1 tablet (500 mg total) by mouth 2 (two) times daily. (Patient not taking: Reported on 11/14/2023), Disp: 14 tablet, Rfl: 0 ketorolac (TORADOL) 10 MG tablet, Take 1 tablet (10 mg total) by mouth every 8 (eight) hours as needed. (Patient not taking: Reported on 11/14/2023), Disp: 15 tablet, Rfl: 0 lamoTRIgine (LAMICTAL) 100 MG tablet, Take one tablet at bedtime. (Patient taking differently: Take 100 mg by mouth daily.), Disp: 30 tablet, Rfl: 5 ondansetron (ZOFRAN-ODT) 8 MG disintegrating tablet, Take 1 tablet (8 mg total) by mouth every 8 (eight) hours as needed for nausea or vomiting. (Patient not taking: Reported on 11/14/2023), Disp: 8 tablet, Rfl: 0 oxyCODONE-acetaminophen (PERCOCET) 7.5-325 MG tablet, Take 1 tablet by mouth every 6 (six) hours as needed. (Patient not taking: Reported on 11/14/2023), Disp: 28 tablet, Rfl: 0 trichloroacetic acid 80 % LIQD, Dispense for in office use (Patient not taking: Reported on 09/19/2023), Disp: 15 mL, Rfl: 0  No current facility-administered medications for this visit.    Blood pressure 111/67, pulse 89, height 5\' 1"  (1.549 m), weight 125 lb (56.7 kg), last menstrual period 09/06/2023.  Physical Exam: 4 incisions all look good Vagina is healing well, no sex til 01/25/24  Diagnostic Tests:   Pathology: benign  Impression + Management plan:   ICD-10-CM   1. Post-operative state: RA TLH  + BS 11/03/23  Z98.890           Medications Prescribed this encounter: No orders of the defined types were placed in this encounter.     Follow up: prn   Lazaro Arms, MD Attending Physician for the Center for [facility] and [facility] 12/26/2023 9:33 AM

## 2023-12-29 ENCOUNTER — Other Ambulatory Visit: Payer: Self-pay | Admitting: Adult Health

## 2023-12-29 DIAGNOSIS — F331 Major depressive disorder, recurrent, moderate: Secondary | ICD-10-CM

## 2023-12-29 DIAGNOSIS — F411 Generalized anxiety disorder: Secondary | ICD-10-CM

## 2023-12-29 DIAGNOSIS — F319 Bipolar disorder, unspecified: Secondary | ICD-10-CM

## 2023-12-29 NOTE — Telephone Encounter (Signed)
Please schedule pt an appt. LV 11/20 due back in 3 months.

## 2024-01-01 ENCOUNTER — Other Ambulatory Visit: Payer: Self-pay | Admitting: Physician Assistant

## 2024-01-01 ENCOUNTER — Telehealth: Payer: Self-pay | Admitting: Physician Assistant

## 2024-01-01 ENCOUNTER — Other Ambulatory Visit: Payer: Self-pay

## 2024-01-01 MED ORDER — PRUCALOPRIDE SUCCINATE 2 MG PO TABS: 1.0000 | ORAL_TABLET | Freq: Every day | ORAL | 0 refills | Status: DC

## 2024-01-01 NOTE — Telephone Encounter (Signed)
 PT is calling to get a refill on Motegrity. She is completely out and scheduled an OV  to get more refills. She is requesting a call back. Please advise.

## 2024-01-02 NOTE — Progress Notes (Deleted)
 01/02/2024 Cristina Davenport [id number] [birth year]  Referring provider: Eden Emms, NP Primary GI doctor: Dr. Meridee Score  ASSESSMENT AND PLAN:   Assessment and Plan              Patient Care Team: Eden Emms, NP as PCP - General (Nurse Practitioner) Corbin Ade, MD as Consulting Physician (Gastroenterology)  HISTORY OF PRESENT ILLNESS: 38 y.o. female with a past medical history of ***and others listed below presents for evaluation of ***.   38 year old female with history of epigastric pain and constipation for years, status post cholecystectomy which actually worsen constipation, previous incisional hernia repair which did not improve constipation presents for worsening symptoms.    Discussed the use of AI scribe software for clinical note transcription with the patient, who gave verbal consent to proceed.  History of Present Illness             She  reports that she quit smoking about 9 years ago. Her smoking use included cigarettes. She started smoking about 17 years ago. She has a 4 pack-year smoking history. She has never used smokeless tobacco. She reports current alcohol use of about 3.0 standard drinks of alcohol per week. She reports current drug use. Drug: Marijuana.  RELEVANT GI HISTORY, LABS, IMAGING: 06/2022 colon and EGD Colonoscopy - Hemorrhoids found on digital rectal exam. - The examined portion of the ileum was normal. - One 3 mm polyp at the recto- sigmoid colon, removed with a cold snare. Resected and retrieved. - Normal mucosa in the entire examined colon otherwise. - Non- bleeding non- thrombosed internal hemorrhoids.   CBC    Component Value Date/Time   WBC 7.3 11/02/2023 1135   RBC 4.64 11/02/2023 1135   HGB 14.0 11/02/2023 1135   HGB 14.7 12/29/2021 0959   HGB 13.3 09/23/2015 1638   HCT 41.9 11/02/2023 1135   HCT 42.9 12/29/2021 0959   PLT 276 11/02/2023 1135   PLT 260 12/29/2021 0959   MCV 90.3 11/02/2023 1135   MCV 89  12/29/2021 0959   MCH 30.2 11/02/2023 1135   MCHC 33.4 11/02/2023 1135   RDW 12.2 11/02/2023 1135   RDW 12.4 12/29/2021 0959   LYMPHSABS 0.4 (L) 01/04/2023 1201   LYMPHSABS 2.0 05/10/2019 1445   MONOABS 0.7 01/04/2023 1201   EOSABS 0.1 01/04/2023 1201   EOSABS 0.1 05/10/2019 1445   BASOSABS 0.1 01/04/2023 1201   BASOSABS 0.0 05/10/2019 1445   Recent Labs    01/04/23 1201 09/22/23 0953 11/02/23 1135  HGB 13.1 13.0 14.0    CMP     Component Value Date/Time   NA 136 11/02/2023 1135   NA 137 12/29/2021 0959   K 3.3 (L) 11/02/2023 1135   CL 102 11/02/2023 1135   CO2 23 11/02/2023 1135   GLUCOSE 92 11/02/2023 1135   BUN 8 11/02/2023 1135   BUN 9 12/29/2021 0959   CREATININE 0.64 11/02/2023 1135   CALCIUM 8.9 11/02/2023 1135   PROT 7.0 11/02/2023 1135   PROT 7.2 12/29/2021 0959   ALBUMIN 4.3 11/02/2023 1135   ALBUMIN 4.9 (H) 12/29/2021 0959   AST 17 11/02/2023 1135   ALT 16 11/02/2023 1135   ALKPHOS 43 11/02/2023 1135   BILITOT 0.7 11/02/2023 1135   BILITOT 0.3 12/29/2021 0959   GFRNONAA >60 11/02/2023 1135   GFRAA >60 10/15/2019 0909      Latest Ref Rng & Units 11/02/2023   11:35 AM 09/22/2023    9:53 AM 11/24/2022  11:58 AM  Hepatic Function  Total Protein 6.5 - 8.1 g/dL 7.0  6.9  7.0   Albumin 3.5 - 5.0 g/dL 4.3  4.3  4.7   AST 15 - 41 U/L 17  18  13    ALT 0 - 44 U/L 16  16  12    Alk Phosphatase 38 - 126 U/L 43  41  50   Total Bilirubin <1.2 mg/dL 0.7  0.8  0.5       Current Medications:    Current Outpatient Medications (Cardiovascular):    Inositol Niacinate 500 MG TABS, Take 500 mg by mouth in the morning.   Current Outpatient Medications (Analgesics):    ketorolac (TORADOL) 10 MG tablet, Take 1 tablet (10 mg total) by mouth every 8 (eight) hours as needed. (Patient not taking: Reported on 11/14/2023)   oxyCODONE-acetaminophen (PERCOCET) 7.5-325 MG tablet, Take 1 tablet by mouth every 6 (six) hours as needed. (Patient not taking: Reported on  11/14/2023)  Current Outpatient Medications (Hematological):    folic acid (FOLVITE) 800 MCG tablet, Take 400 mcg by mouth daily in the afternoon.  Current Outpatient Medications (Other):    ADDERALL XR 30 MG 24 hr capsule, Take 1 capsule (30 mg total) by mouth daily.   amphetamine-dextroamphetamine (ADDERALL) 10 MG tablet, Take 1 tablet (10 mg total) by mouth daily.   ASHWAGANDHA PO, Take 2 capsules by mouth in the morning.   B Complex-C (SUPER B COMPLEX PO), Take 1 capsule by mouth in the morning.   ciprofloxacin (CIPRO) 500 MG tablet, Take 1 tablet (500 mg total) by mouth 2 (two) times daily. (Patient not taking: Reported on 11/14/2023)   clonazePAM (KLONOPIN) 0.5 MG tablet, Take 1 tablet (0.5 mg total) by mouth 2 (two) times daily as needed for anxiety.   L-LYSINE PO, Take 1 capsule by mouth in the morning.   L-THEANINE PO, Take 1 tablet by mouth in the morning.   lamoTRIgine (LAMICTAL) 100 MG tablet, TAKE 1 TABLET BY MOUTH EVERYDAY AT BEDTIME   Magnesium 400 MG TABS, Take 400 mg by mouth every evening.   Moringa Oleifera (MORINGA PO), Take 2 tablets by mouth every evening.   Multiple Vitamins-Minerals (HAIR SKIN NAILS PO), Take 1 tablet by mouth in the morning.   Multiple Vitamins-Minerals (ZINC PO), Take 1 tablet by mouth every evening.   ondansetron (ZOFRAN-ODT) 8 MG disintegrating tablet, Take 1 tablet (8 mg total) by mouth every 8 (eight) hours as needed for nausea or vomiting. (Patient not taking: Reported on 11/14/2023)   OVER THE COUNTER MEDICATION, Take 1 capsule by mouth in the morning. Lion's Mane   Probiotic Product (DIGESTIVE ADV PREBIOT+PROBIOT PO), Take 1 capsule by mouth in the morning.   Prucalopride Succinate (MOTEGRITY) 2 MG TABS, Take 1 tablet (2 mg total) by mouth daily.   sertraline (ZOLOFT) 100 MG tablet, TAKE 2 TABLETS BY MOUTH EVERY DAY   silver sulfADIAZINE (SILVADENE) 1 % cream, Apply to area 3 times daily (Patient taking differently: Apply 1 Application  topically 3 (three) times daily as needed (irritation.).)   Simethicone 250 MG CAPS, Take 250 mg by mouth in the morning.   tretinoin (RETIN-A) 0.05 % cream, Apply 1 Application topically 3 (three) times a week.   trichloroacetic acid 80 % LIQD, Dispense for in office use (Patient not taking: Reported on 09/19/2023)   TURMERIC CURCUMIN PO, Take 1 capsule by mouth every evening. W/Ginger Powder  Medical History:  Past Medical History:  Diagnosis Date   Anxiety  Anxiety    Bipolar 1 disorder (HCC)    Depression    Family history of adverse reaction to anesthesia    GERD (gastroesophageal reflux disease)    IBS (irritable bowel syndrome)    Migraine without aura    Allergies:  Allergies  Allergen Reactions   Latex Other (See Comments)    General irritation   Doxycycline Rash    Solar rash     Surgical History:  She  has a past surgical history that includes Examination under anesthesia (04/21/2012); Esophagogastroduodenoscopy (N/A, 04/30/2013); Abdominal surgery; Cholecystectomy (N/A, 07/10/2013); Wisdom tooth extraction; Incisional hernia repair (N/A, 04/06/2021); and Robotic assisted total hysterectomy with bilateral salpingo oophorectomy (Bilateral, 11/03/2023). Family History:  Her family history includes Cancer in her father and maternal grandfather; Crohn's disease in her cousin; Hypertension in her maternal grandfather and maternal grandmother; Kidney failure in her maternal grandmother; Mental illness in her mother; Stroke in her maternal grandfather and maternal grandmother. She was adopted.  REVIEW OF SYSTEMS  : All other systems reviewed and negative except where noted in the History of Present Illness.  PHYSICAL EXAM: LMP 09/06/2023  General Appearance: Well nourished, in no apparent distress. Head:   Normocephalic and atraumatic. Eyes:  sclerae anicteric,conjunctive pink  Respiratory: Respiratory effort normal, BS equal bilaterally without rales, rhonchi,  wheezing. Cardio: RRR with no MRGs. Peripheral pulses intact.  Abdomen: Soft,  {BlankSingle:19197::"Flat","Obese","Non-distended"} ,active bowel sounds. {actendernessAB:27319} tenderness {anatomy; site abdomen:5010}. {BlankMultiple:19196::"Without guarding","With guarding","Without rebound","With rebound"}. No masses. Rectal: {acrectalexam:27461} Musculoskeletal: Full ROM, {PSY - GAIT AND STATION:22860} gait. {With/Without:304960234} edema. Skin:  Dry and intact without significant lesions or rashes Neuro: Alert and  oriented x4;  No focal deficits. Psych:  Cooperative. Normal mood and affect.    Doree Albee, PA-C 3:29 PM

## 2024-01-03 ENCOUNTER — Ambulatory Visit: Payer: 59 | Admitting: Physician Assistant

## 2024-01-03 NOTE — Telephone Encounter (Signed)
 Spoke to pt today at 11:08a.  Pt to check to see if she can do virtual first because if not, she will have to wait until April or so before Cristina Davenport has an in person appt.  She will call back to schedule

## 2024-01-05 ENCOUNTER — Other Ambulatory Visit (HOSPITAL_COMMUNITY): Payer: Self-pay

## 2024-01-05 ENCOUNTER — Telehealth: Payer: Self-pay

## 2024-01-05 NOTE — Telephone Encounter (Signed)
*  Gastro  Pharmacy Patient Advocate Encounter  Received notification from [facility] that Prior Authorization for Prucalopride Succinate 2MG  tablets  has been APPROVED from 01/05/2024 to 01/04/2025   PA #/Case ID/Reference #: [id number]  Had to do PA over the phone with patients plan as it is secondary to the Primary Plan which has coverage through 11/2023  Per test claim through both plans co-pay is $4.00

## 2024-01-05 NOTE — Telephone Encounter (Signed)
 PA request has been Approved. New Encounter created for follow up. For additional info see Pharmacy Prior Auth telephone encounter from 02/21.

## 2024-01-12 ENCOUNTER — Other Ambulatory Visit: Payer: Self-pay | Admitting: Adult Health

## 2024-01-12 DIAGNOSIS — F331 Major depressive disorder, recurrent, moderate: Secondary | ICD-10-CM

## 2024-01-12 DIAGNOSIS — F411 Generalized anxiety disorder: Secondary | ICD-10-CM

## 2024-01-17 ENCOUNTER — Other Ambulatory Visit: Payer: Self-pay

## 2024-01-17 ENCOUNTER — Telehealth: Payer: Self-pay | Admitting: Adult Health

## 2024-01-17 DIAGNOSIS — F909 Attention-deficit hyperactivity disorder, unspecified type: Secondary | ICD-10-CM

## 2024-01-17 NOTE — Telephone Encounter (Signed)
 Pended both doses of Adderall to [facility] in [place].

## 2024-01-17 NOTE — Telephone Encounter (Signed)
 Pt made appt for 3/13. She is requesting RF on both doses of Adderall, 10mg  and XR 30mg . Please send to : [id number] [address] [address], [place] - [id number]

## 2024-01-18 MED ORDER — AMPHETAMINE-DEXTROAMPHETAMINE 10 MG PO TABS: 10.0000 mg | ORAL_TABLET | Freq: Every day | ORAL | 0 refills | Status: DC

## 2024-01-18 MED ORDER — ADDERALL XR 30 MG PO CP24: 30.0000 mg | ORAL_CAPSULE | Freq: Every day | ORAL | 0 refills | Status: DC

## 2024-01-25 ENCOUNTER — Encounter: Payer: Self-pay | Admitting: Adult Health

## 2024-01-25 ENCOUNTER — Telehealth: Admitting: Adult Health

## 2024-01-25 DIAGNOSIS — F319 Bipolar disorder, unspecified: Secondary | ICD-10-CM

## 2024-01-25 DIAGNOSIS — F331 Major depressive disorder, recurrent, moderate: Secondary | ICD-10-CM | POA: Diagnosis not present

## 2024-01-25 DIAGNOSIS — F411 Generalized anxiety disorder: Secondary | ICD-10-CM

## 2024-01-25 DIAGNOSIS — F909 Attention-deficit hyperactivity disorder, unspecified type: Secondary | ICD-10-CM | POA: Diagnosis not present

## 2024-01-25 DIAGNOSIS — F422 Mixed obsessional thoughts and acts: Secondary | ICD-10-CM | POA: Diagnosis not present

## 2024-01-25 MED ORDER — ADDERALL XR 30 MG PO CP24: 30.0000 mg | ORAL_CAPSULE | Freq: Every day | ORAL | 0 refills | Status: DC

## 2024-01-25 MED ORDER — CLONAZEPAM 0.5 MG PO TABS: 0.5000 mg | ORAL_TABLET | Freq: Two times a day (BID) | ORAL | 2 refills | Status: DC | PRN

## 2024-01-25 MED ORDER — AMPHETAMINE-DEXTROAMPHETAMINE 10 MG PO TABS: 10.0000 mg | ORAL_TABLET | Freq: Every day | ORAL | 0 refills | Status: DC

## 2024-01-25 MED ORDER — LAMOTRIGINE 100 MG PO TABS: ORAL_TABLET | ORAL | 5 refills | Status: DC

## 2024-01-25 MED ORDER — SERTRALINE HCL 100 MG PO TABS: 200.0000 mg | ORAL_TABLET | Freq: Every day | ORAL | 5 refills | Status: DC

## 2024-01-25 NOTE — Progress Notes (Signed)
 Cristina Davenport [id number] 1985/11/16 38 y.o.  Virtual Visit via Video Note  I connected with pt @ on 01/25/24 at 10:00 AM EDT by a video enabled telemedicine application and verified that I am speaking with the correct person using two identifiers.   I discussed the limitations of evaluation and management by telemedicine and the availability of in person appointments. The patient expressed understanding and agreed to proceed.  I discussed the assessment and treatment plan with the patient. The patient was provided an opportunity to ask questions and all were answered. The patient agreed with the plan and demonstrated an understanding of the instructions.   The patient was advised to call back or seek an in-person evaluation if the symptoms worsen or if the condition fails to improve as anticipated.  I provided 25 minutes of non-face-to-face time during this encounter.  The patient was located at home.  The provider was located at [facility].   Dorothyann Gibbs, NP   Subjective:   Patient ID:  Cristina Davenport is a 38 y.o. (DOB 06-09-1986) female.  Chief Complaint: No chief complaint on file.   HPI AMERICUS SCHEURICH presents for follow-up of ADHD, MDD, GAD, and obsessional thoughts and acts.   Describes mood today as "better". Pleasant. Mood symptoms - reports situational depression, anxiety and irritability. Reports stable interest and motivation. Reports feeling overwhelmed at times. Denies panic attacks. Reports worry, rumination and overthinking. Reports obsessive thoughts and acts. Reports decreased medical concerns. Reports hysterectomy on 12/202025. Mood is variable - "depends on what's going on around me". Stating "I feel like I'm doing ok". Feels like medications are helpful. Taking medications as prescribed. Energy levels stable. Active, has a regular exercise routine. Enjoys some usual interests and activities. Married. Lives with husband. Has 3 children - dog. Family  local. Spending time with family. Appetite adequate. Weight loss - 119 from 126 pounds. Sleeps better some nights than others. Averages 6 to 8 hours of broken sleep.   Focus and concentration difficulties. Feels like the Adderall is helpful to keep her on "track". Completing tasks. Managing aspects of household. Works in Research officer, political party.  Denies SI or HI.  Denies AH or VH. Denies self harm. Denies substance use.  Previous medication trials: Zoloft, Hydroxyzine, Adderall XR 20mg  daily, Lamictal   Review of Systems:  Review of Systems  Musculoskeletal:  Negative for gait problem.  Neurological:  Negative for tremors.  Psychiatric/Behavioral:         Please refer to HPI    Medications: I have reviewed the patient's current medications.  Current Outpatient Medications  Medication Sig Dispense Refill   ADDERALL XR 30 MG 24 hr capsule Take 1 capsule (30 mg total) by mouth daily. 30 capsule 0   amphetamine-dextroamphetamine (ADDERALL) 10 MG tablet Take 1 tablet (10 mg total) by mouth daily. 30 tablet 0   ASHWAGANDHA PO Take 2 capsules by mouth in the morning.     B Complex-C (SUPER B COMPLEX PO) Take 1 capsule by mouth in the morning.     ciprofloxacin (CIPRO) 500 MG tablet Take 1 tablet (500 mg total) by mouth 2 (two) times daily. (Patient not taking: Reported on 11/14/2023) 14 tablet 0   clonazePAM (KLONOPIN) 0.5 MG tablet Take 1 tablet (0.5 mg total) by mouth 2 (two) times daily as needed for anxiety. 60 tablet 2   folic acid (FOLVITE) 800 MCG tablet Take 400 mcg by mouth daily in the afternoon.     Inositol Niacinate 500  MG TABS Take 500 mg by mouth in the morning.     ketorolac (TORADOL) 10 MG tablet Take 1 tablet (10 mg total) by mouth every 8 (eight) hours as needed. (Patient not taking: Reported on 11/14/2023) 15 tablet 0   L-LYSINE PO Take 1 capsule by mouth in the morning.     L-THEANINE PO Take 1 tablet by mouth in the morning.     lamoTRIgine (LAMICTAL) 100 MG tablet TAKE 1 TABLET  BY MOUTH EVERYDAY AT BEDTIME 30 tablet 0   Magnesium 400 MG TABS Take 400 mg by mouth every evening.     Moringa Oleifera (MORINGA PO) Take 2 tablets by mouth every evening.     MOTEGRITY 2 MG TABS TAKE 1 TABLET BY MOUTH EVERY DAY 90 tablet 0   Multiple Vitamins-Minerals (HAIR SKIN NAILS PO) Take 1 tablet by mouth in the morning.     Multiple Vitamins-Minerals (ZINC PO) Take 1 tablet by mouth every evening.     ondansetron (ZOFRAN-ODT) 8 MG disintegrating tablet Take 1 tablet (8 mg total) by mouth every 8 (eight) hours as needed for nausea or vomiting. (Patient not taking: Reported on 11/14/2023) 8 tablet 0   OVER THE COUNTER MEDICATION Take 1 capsule by mouth in the morning. Lion's Mane     oxyCODONE-acetaminophen (PERCOCET) 7.5-325 MG tablet Take 1 tablet by mouth every 6 (six) hours as needed. (Patient not taking: Reported on 11/14/2023) 28 tablet 0   Probiotic Product (DIGESTIVE ADV PREBIOT+PROBIOT PO) Take 1 capsule by mouth in the morning.     sertraline (ZOLOFT) 100 MG tablet TAKE 2 TABLETS BY MOUTH EVERY DAY 60 tablet 0   silver sulfADIAZINE (SILVADENE) 1 % cream Apply to area 3 times daily (Patient taking differently: Apply 1 Application topically 3 (three) times daily as needed (irritation.).) 50 g 11   Simethicone 250 MG CAPS Take 250 mg by mouth in the morning.     tretinoin (RETIN-A) 0.05 % cream Apply 1 Application topically 3 (three) times a week.     trichloroacetic acid 80 % LIQD Dispense for in office use (Patient not taking: Reported on 09/19/2023) 15 mL 0   TURMERIC CURCUMIN PO Take 1 capsule by mouth every evening. W/Ginger Powder     No current facility-administered medications for this visit.    Medication Side Effects: None  Allergies:  Allergies  Allergen Reactions   Latex Other (See Comments)    General irritation   Doxycycline Rash    Solar rash    Past Medical History:  Diagnosis Date   Anxiety    Anxiety    Bipolar 1 disorder (HCC)    Depression     Family history of adverse reaction to anesthesia    GERD (gastroesophageal reflux disease)    IBS (irritable bowel syndrome)    Migraine without aura     Family History  Adopted: Yes  Problem Relation Age of Onset   Mental illness Mother    Cancer Father        skin cancer   Hypertension Maternal Grandmother    Stroke Maternal Grandmother    Kidney failure Maternal Grandmother    Hypertension Maternal Grandfather    Stroke Maternal Grandfather    Cancer Maternal Grandfather        skin   Crohn's disease Cousin    Colon cancer Neg Hx    Rectal cancer Neg Hx    Stomach cancer Neg Hx     Social History   Socioeconomic History  Marital status: Married    Spouse name: Elayne Snare   Number of children: 3   Years of education: 12   Highest education level: Some college, no degree  Occupational History   Occupation: Magazine features editor   Occupation: realtor  Tobacco Use   Smoking status: Former    Current packs/day: 0.00    Average packs/day: 0.5 packs/day for 8.0 years (4.0 ttl pk-yrs)    Types: Cigarettes    Start date: 09/19/2006    Quit date: 09/19/2014    Years since quitting: 9.3   Smokeless tobacco: Never  Vaping Use   Vaping status: Every Day   Substances: Nicotine, Flavoring  Substance and Sexual Activity   Alcohol use: Yes    Alcohol/week: 3.0 standard drinks of alcohol    Types: 3 Glasses of wine per week    Comment: scoially   Drug use: Yes    Types: Marijuana    Comment: for pain in the back   Sexual activity: Yes    Partners: Male    Birth control/protection: Pill  Other Topics Concern   Not on file  Social History Narrative   Married since July 2020,been together for 10 years.Lives with husband and kids.   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   Social Drivers of Corporate investment banker Strain: Low Risk  (12/29/2021)   Overall Financial Resource Strain (CARDIA)    Difficulty of Paying Living Expenses: Not hard at  all  Food Insecurity: No Food Insecurity (12/29/2021)   Hunger Vital Sign    Worried About Running Out of Food in the Last Year: Never true    Ran Out of Food in the Last Year: Never true  Transportation Needs: No Transportation Needs (12/29/2021)   PRAPARE - Administrator, Civil Service (Medical): No    Lack of Transportation (Non-Medical): No  Physical Activity: Sufficiently Active (12/29/2021)   Exercise Vital Sign    Days of Exercise per Week: 5 days    Minutes of Exercise per Session: 40 min  Stress: Stress Concern Present (12/29/2021)   [employer] of Occupational Health - Occupational Stress Questionnaire    Feeling of Stress : Very much  Social Connections: Unknown (12/29/2021)   Social Connection and Isolation Panel [NHANES]    Frequency of Communication with Friends and Family: More than three times a week    Frequency of Social Gatherings with Friends and Family: Once a week    Attends Religious Services: Patient declined    Database administrator or Organizations: No    Attends Engineer, structural: More than 4 times per year    Marital Status: Married  Catering manager Violence: Not At Risk (12/29/2021)   Humiliation, Afraid, Rape, and Kick questionnaire    Fear of Current or Ex-Partner: No    Emotionally Abused: No    Physically Abused: No    Sexually Abused: No    Past Medical History, Surgical history, Social history, and Family history were reviewed and updated as appropriate.   Please see review of systems for further details on the patient's review from today.   Objective:   Physical Exam:  LMP 09/06/2023   Physical Exam Constitutional:      General: She is not in acute distress. Musculoskeletal:        General: No deformity.  Neurological:     Mental Status: She is alert and oriented to person, place, and time.  Coordination: Coordination normal.  Psychiatric:        Attention and Perception: Attention and perception  normal. She does not perceive auditory or visual hallucinations.        Mood and Affect: Mood normal. Mood is not anxious or depressed. Affect is not labile, blunt, angry or inappropriate.        Speech: Speech normal.        Behavior: Behavior normal.        Thought Content: Thought content normal. Thought content is not paranoid or delusional. Thought content does not include homicidal or suicidal ideation. Thought content does not include homicidal or suicidal plan.        Cognition and Memory: Cognition and memory normal.        Judgment: Judgment normal.     Comments: Insight intact     Lab Review:     Component Value Date/Time   NA 136 11/02/2023 1135   NA 137 12/29/2021 0959   K 3.3 (L) 11/02/2023 1135   CL 102 11/02/2023 1135   CO2 23 11/02/2023 1135   GLUCOSE 92 11/02/2023 1135   BUN 8 11/02/2023 1135   BUN 9 12/29/2021 0959   CREATININE 0.64 11/02/2023 1135   CALCIUM 8.9 11/02/2023 1135   PROT 7.0 11/02/2023 1135   PROT 7.2 12/29/2021 0959   ALBUMIN 4.3 11/02/2023 1135   ALBUMIN 4.9 (H) 12/29/2021 0959   AST 17 11/02/2023 1135   ALT 16 11/02/2023 1135   ALKPHOS 43 11/02/2023 1135   BILITOT 0.7 11/02/2023 1135   BILITOT 0.3 12/29/2021 0959   GFRNONAA >60 11/02/2023 1135   GFRAA >60 10/15/2019 0909       Component Value Date/Time   WBC 7.3 11/02/2023 1135   RBC 4.64 11/02/2023 1135   HGB 14.0 11/02/2023 1135   HGB 14.7 12/29/2021 0959   HGB 13.3 09/23/2015 1638   HCT 41.9 11/02/2023 1135   HCT 42.9 12/29/2021 0959   PLT 276 11/02/2023 1135   PLT 260 12/29/2021 0959   MCV 90.3 11/02/2023 1135   MCV 89 12/29/2021 0959   MCH 30.2 11/02/2023 1135   MCHC 33.4 11/02/2023 1135   RDW 12.2 11/02/2023 1135   RDW 12.4 12/29/2021 0959   LYMPHSABS 0.4 (L) 01/04/2023 1201   LYMPHSABS 2.0 05/10/2019 1445   MONOABS 0.7 01/04/2023 1201   EOSABS 0.1 01/04/2023 1201   EOSABS 0.1 05/10/2019 1445   BASOSABS 0.1 01/04/2023 1201   BASOSABS 0.0 05/10/2019 1445    No  results found for: "POCLITH", "LITHIUM"   No results found for: "PHENYTOIN", "PHENOBARB", "VALPROATE", "CBMZ"   .res Assessment: Plan:     Plan:  PDMP reviewed  Adderall 10mg  daily - plans to take earlier in the day Adderall XR 30mg  daily Zoloft 200mg  daily Lamictal 100mg  daily  Clonazepam 0.5mg  daily to BID prn - uses as needed   Seeing therapist  RTC 2 months  25 minutes spent dedicated to the care of this patient on the date of this encounter to include pre-visit review of records, ordering of medication, post visit documentation, and face-to-face time with the patient discussing ADHD, MDD, GAD and obsessional thoughts and acts. Discussed continuing current medication regimen.  Monitor BP between visits while taking stimulant medication.   Patient advised to contact office with any questions, adverse effects, or acute worsening in signs and symptoms.  Discussed potential benefits, risks, and side effects of stimulants with patient to include increased heart rate, palpitations, insomnia, increased anxiety, increased irritability, or  decreased appetite. Instructed patient to contact office if experiencing any significant tolerability issues.  Discussed potential benefits, risk, and side effects of benzodiazepines to include potential risk of tolerance and dependence, as well as possible drowsiness. Advised patient not to drive if experiencing drowsiness and to take lowest possible effective dose to minimize risk of dependence and tolerance.   Counseled patient regarding potential benefits, risks, and side effects of Lamictal to include potential risk of Stevens-Johnson syndrome. Advised patient to stop taking Lamictal and contact office immediately if rash develops and to seek urgent medical attention if rash is severe and/or spreading quickly.    There are no diagnoses linked to this encounter.   Please see After Visit Summary for patient specific instructions.  Future  Appointments  Date Time Provider Department Center  01/25/2024 10:00 AM Maher Shon, Thereasa Solo, NP [facility] None  02/08/2024  9:20 AM Doree Albee, PA-C [facility]    No orders of the defined types were placed in this encounter.     -------------------------------

## 2024-02-07 NOTE — Progress Notes (Unsigned)
 02/08/2024 Cristina Davenport [id number] Jan 30, 1986  Referring provider: Eden Emms, NP Primary GI doctor: Dr. Meridee Score (Dr. Allena Napoleon. Jena Gauss at [place])  ASSESSMENT AND PLAN:  Gastritis with LA grade esophagitis A 2 cm hiatal hernia  Status post cholecystectomy EGD 2023 gastritis and la grade A Denies GERD Will do low dose pepcid to see if some of bloating if from GERD  Ab Bloating with borborygmus Worse with processed foods, lactulose She has nausea occ, no fever, chills Normal colon, EGD with gastritis no GERD, not on PPI Suspicious for SIBO as well, will do trial of xifaxin ? Pelvic floor, has had 3 kids, has urine symptoms as well will refer to PT On Motegrity, ? Globalparesis? Consider GES  Benign colon polyp Colonoscopy 2023 recall age 61  History of constipation with internal hemorrhoids Failed linzess, miralax, dulcolax motegrity daily, has not had for one month, has a big difference, will refill and send to PA team  Patient Care Team: Eden Emms, NP as PCP - General (Nurse Practitioner) Corbin Ade, MD as Consulting Physician (Gastroenterology)  HISTORY OF PRESENT ILLNESS: 38 y.o. female with a past medical history of PCOS, H. pylori, constipation, status post cholecystectomy, status post incisional hernia repair with Dr. Lovell Sheehan and others listed below presents refilled medicines  04/30/2013 upper endoscopy showed abnormal distal esophagus biopsied, tiny hiatal hernia otherwise unremarkable.   Pathology consistent with GERD 03/26/2021 CT abdomen pelvis without contrast showed slightly complex 3.4 x 1.1 cm lipomatous region anterior abdominal soft tissues right of midline, recommended nonemergent MRI.  Had cecal diverticula.  Seen by [facility], unremarkable thoracic MRI, thought to be hemangioma of thoracic spine. 06/28/2022 colonoscopy and EGD Colonoscopy showed internal hemorrhoids normal TI 3 mm polyp rectosigmoid colon, hyperplastic  polyp recall age 38 Upper endoscopy showed LA grade a esophagitis 2 cm hiatal hernia erythematous mucosa in the antrum unremarkable duodenum, unremarkable for H. pylori or celiac.  Discussed the use of AI scribe software for clinical note transcription with the patient, who gave verbal consent to proceed.  History of Present Illness   Cristina Davenport is a 38 year old female with a history of hiatal hernia and gastrointestinal issues who presents with bloating and gastrointestinal discomfort.  She experiences persistent bloating and gastrointestinal discomfort, particularly after consuming non-fresh foods such as frozen or boxed items, and dairy. Significant bloating, gas, and burping occur, which she describes as 'trapped gas' that requires her to perform stretches for relief. Symptoms are alleviated by taking Gas-X.  She has been experiencing symptoms of bloating and gas for a prolonged period, exacerbated when she does not take Motegrity. Without Motegrity, she gains weight rapidly, feels 'foggy', and experiences fluid retention in her legs. She has not taken Motegrity for a month due to waiting for preauthorization.  She has a history of a hiatal hernia and inflammation noted on a previous endoscopy, which was negative for H. pylori and celiac disease. She underwent a colonoscopy that revealed hemorrhoids and a benign polyp. She does not require another screening colonoscopy until age 80.  She describes passing 'yellow stones' in her stool, which she associates with not having a gallbladder. She underwent a HIDA scan in the past and had her gallbladder removed due to gallstones.  She experiences difficulty swallowing, particularly with liquids and saliva, which she describes as needing to 'practice swallowing'. This occurs more frequently during what she refers to as 'flare-ups'.  She reports frequent urination without burning, but feels she  does not completely empty her bladder, especially  during bloating episodes. She has not undergone pelvic floor physical therapy.  She experiences fatigue and muscle cramps during gastrointestinal flare-ups, which she associates with low potassium and calcium levels noted in past blood work. She is currently on Lamictal and has not been on any fluid pills.     26, 54 and 38 year old.  Current Medications:    Current Outpatient Medications (Cardiovascular):    Inositol Niacinate 500 MG TABS, Take 500 mg by mouth in the morning.   Current Outpatient Medications (Analgesics):    ketorolac (TORADOL) 10 MG tablet, Take 1 tablet (10 mg total) by mouth every 8 (eight) hours as needed. (Patient not taking: Reported on 02/08/2024)   oxyCODONE-acetaminophen (PERCOCET) 7.5-325 MG tablet, Take 1 tablet by mouth every 6 (six) hours as needed. (Patient not taking: Reported on 02/08/2024)  Current Outpatient Medications (Hematological):    folic acid (FOLVITE) 800 MCG tablet, Take 400 mcg by mouth daily in the afternoon.  Current Outpatient Medications (Other):    ADDERALL XR 30 MG 24 hr capsule, Take 1 capsule (30 mg total) by mouth daily.   [START ON 02/22/2024] ADDERALL XR 30 MG 24 hr capsule, Take 1 capsule (30 mg total) by mouth daily.   amphetamine-dextroamphetamine (ADDERALL) 10 MG tablet, Take 1 tablet (10 mg total) by mouth daily.   [START ON 02/22/2024] amphetamine-dextroamphetamine (ADDERALL) 10 MG tablet, Take 1 tablet (10 mg total) by mouth daily.   ASHWAGANDHA PO, Take 2 capsules by mouth in the morning.   B Complex-C (SUPER B COMPLEX PO), Take 1 capsule by mouth in the morning.   clonazePAM (KLONOPIN) 0.5 MG tablet, Take 1 tablet (0.5 mg total) by mouth 2 (two) times daily as needed for anxiety.   famotidine (PEPCID) 40 MG tablet, Take 1 tablet (40 mg total) by mouth at bedtime.   L-LYSINE PO, Take 1 capsule by mouth in the morning.   L-THEANINE PO, Take 1 tablet by mouth in the morning.   lamoTRIgine (LAMICTAL) 100 MG tablet, TAKE 1 TABLET  BY MOUTH EVERYDAY AT BEDTIME   Magnesium 400 MG TABS, Take 400 mg by mouth every evening.   Moringa Oleifera (MORINGA PO), Take 2 tablets by mouth every evening.   Multiple Vitamins-Minerals (HAIR SKIN NAILS PO), Take 1 tablet by mouth in the morning.   Multiple Vitamins-Minerals (ZINC PO), Take 1 tablet by mouth every evening.   ondansetron (ZOFRAN-ODT) 8 MG disintegrating tablet, Take 1 tablet (8 mg total) by mouth every 8 (eight) hours as needed for nausea or vomiting.   OVER THE COUNTER MEDICATION, Take 1 capsule by mouth in the morning. Lion's Mane   Probiotic Product (DIGESTIVE ADV PREBIOT+PROBIOT PO), Take 1 capsule by mouth in the morning.   rifaximin (XIFAXAN) 550 MG TABS tablet, Take 1 tablet (550 mg total) by mouth 3 (three) times daily for 14 days.   sertraline (ZOLOFT) 100 MG tablet, Take 2 tablets (200 mg total) by mouth daily.   silver sulfADIAZINE (SILVADENE) 1 % cream, Apply to area 3 times daily (Patient taking differently: Apply 1 Application topically 3 (three) times daily as needed (irritation.).)   Simethicone 250 MG CAPS, Take 250 mg by mouth in the morning.   tretinoin (RETIN-A) 0.05 % cream, Apply 1 Application topically 3 (three) times a week.   trichloroacetic acid 80 % LIQD, Dispense for in office use   TURMERIC CURCUMIN PO, Take 1 capsule by mouth every evening. W/Ginger Powder   Prucalopride  Succinate (MOTEGRITY) 2 MG TABS, Take 1 tablet (2 mg total) by mouth daily.  Medical History:  Past Medical History:  Diagnosis Date   Anxiety    Anxiety    Bipolar 1 disorder (HCC)    Depression    Family history of adverse reaction to anesthesia    GERD (gastroesophageal reflux disease)    IBS (irritable bowel syndrome)    Migraine without aura    Allergies:  Allergies  Allergen Reactions   Latex Other (See Comments)    General irritation   Doxycycline Rash    Solar rash     Surgical History:  She  has a past surgical history that includes Examination under  anesthesia (04/21/2012); Esophagogastroduodenoscopy (N/A, 04/30/2013); Abdominal surgery; Cholecystectomy (N/A, 07/10/2013); Wisdom tooth extraction; Incisional hernia repair (N/A, 04/06/2021); and Robotic assisted total hysterectomy with bilateral salpingo oophorectomy (Bilateral, 11/03/2023). Family History:  Her family history includes Cancer in her father and maternal grandfather; Crohn's disease in her cousin; Hypertension in her maternal grandfather and maternal grandmother; Kidney failure in her maternal grandmother; Mental illness in her mother; Stroke in her maternal grandfather and maternal grandmother. She was adopted. Social History:   reports that she quit smoking about 9 years ago. Her smoking use included cigarettes. She started smoking about 17 years ago. She has a 4 pack-year smoking history. She has never used smokeless tobacco. She reports current alcohol use of about 3.0 standard drinks of alcohol per week. She reports current drug use. Drug: Marijuana.  REVIEW OF SYSTEMS  : All other systems reviewed and negative except where noted in the History of Present Illness.   PHYSICAL EXAM: BP 118/62   Pulse 97   Ht 5\' 1"  (1.549 m)   Wt 128 lb (58.1 kg)   LMP 09/06/2023   BMI 24.19 kg/m  General:   Pleasant, well developed female in no acute distress Head:   Normocephalic and atraumatic. Eyes:  sclerae anicteric,conjunctive pink  Heart:   regular rate and rhythm Pulm:  Clear anteriorly; no wheezing Abdomen:   Soft, Non-distended AB, Sluggish bowel sounds. mild tenderness in the epigastrium and in the lower abdomen. Without guarding and Without rebound, No organomegaly appreciated. Rectal: Not evaluated Extremities:  Without edema. Msk: Symmetrical without gross deformities. Peripheral pulses intact.  Neurologic:  Alert and  oriented x4;  No focal deficits.  Skin:   Dry and intact without significant lesions or rashes. Psychiatric:  Cooperative. Normal mood and  affect.    Doree Albee, PA-C 10:29 AM

## 2024-02-08 ENCOUNTER — Encounter: Payer: Self-pay | Admitting: Physician Assistant

## 2024-02-08 ENCOUNTER — Other Ambulatory Visit

## 2024-02-08 ENCOUNTER — Ambulatory Visit: Payer: 59 | Admitting: Physician Assistant

## 2024-02-08 ENCOUNTER — Other Ambulatory Visit (INDEPENDENT_AMBULATORY_CARE_PROVIDER_SITE_OTHER)

## 2024-02-08 VITALS — BP 118/62 | HR 97 | Ht 61.0 in | Wt 128.0 lb

## 2024-02-08 DIAGNOSIS — K5900 Constipation, unspecified: Secondary | ICD-10-CM

## 2024-02-08 DIAGNOSIS — R140 Abdominal distension (gaseous): Secondary | ICD-10-CM | POA: Diagnosis not present

## 2024-02-08 DIAGNOSIS — K219 Gastro-esophageal reflux disease without esophagitis: Secondary | ICD-10-CM | POA: Diagnosis not present

## 2024-02-08 DIAGNOSIS — K5904 Chronic idiopathic constipation: Secondary | ICD-10-CM | POA: Diagnosis not present

## 2024-02-08 DIAGNOSIS — K2970 Gastritis, unspecified, without bleeding: Secondary | ICD-10-CM | POA: Diagnosis not present

## 2024-02-08 DIAGNOSIS — K648 Other hemorrhoids: Secondary | ICD-10-CM

## 2024-02-08 DIAGNOSIS — Z9049 Acquired absence of other specified parts of digestive tract: Secondary | ICD-10-CM

## 2024-02-08 DIAGNOSIS — D126 Benign neoplasm of colon, unspecified: Secondary | ICD-10-CM | POA: Diagnosis not present

## 2024-02-08 DIAGNOSIS — K449 Diaphragmatic hernia without obstruction or gangrene: Secondary | ICD-10-CM

## 2024-02-08 DIAGNOSIS — K209 Esophagitis, unspecified without bleeding: Secondary | ICD-10-CM

## 2024-02-08 DIAGNOSIS — D127 Benign neoplasm of rectosigmoid junction: Secondary | ICD-10-CM

## 2024-02-08 LAB — COMPREHENSIVE METABOLIC PANEL WITH GFR
ALT: 16 U/L (ref 0–35)
AST: 15 U/L (ref 0–37)
Albumin: 4.5 g/dL (ref 3.5–5.2)
Alkaline Phosphatase: 47 U/L (ref 39–117)
BUN: 14 mg/dL (ref 6–23)
CO2: 28 meq/L (ref 19–32)
Calcium: 9 mg/dL (ref 8.4–10.5)
Chloride: 103 meq/L (ref 96–112)
Creatinine, Ser: 0.69 mg/dL (ref 0.40–1.20)
GFR: 112.19 mL/min (ref >60.00)
Glucose, Bld: 59 mg/dL — ABNORMAL LOW (ref 70–99)
Potassium: 4.1 meq/L (ref 3.5–5.1)
Sodium: 137 meq/L (ref 135–145)
Total Bilirubin: 0.3 mg/dL (ref 0.2–1.2)
Total Protein: 7 g/dL (ref 6.0–8.3)

## 2024-02-08 LAB — CBC WITH DIFFERENTIAL/PLATELET
Basophils Absolute: 0.1 10*3/uL (ref 0.0–0.1)
Basophils Relative: 0.7 % (ref 0.0–3.0)
Eosinophils Absolute: 0.3 10*3/uL (ref 0.0–0.7)
Eosinophils Relative: 3.3 % (ref 0.0–5.0)
HCT: 39.9 % (ref 36.0–46.0)
Hemoglobin: 13.3 g/dL (ref 12.0–15.0)
Lymphocytes Relative: 26.8 % (ref 12.0–46.0)
Lymphs Abs: 2.3 10*3/uL (ref 0.7–4.0)
MCHC: 33.3 g/dL (ref 30.0–36.0)
MCV: 89.6 fl (ref 78.0–100.0)
Monocytes Absolute: 0.7 10*3/uL (ref 0.1–1.0)
Monocytes Relative: 8.4 % (ref 3.0–12.0)
Neutro Abs: 5.2 10*3/uL (ref 1.4–7.7)
Neutrophils Relative %: 60.8 % (ref 43.0–77.0)
Platelets: 312 10*3/uL (ref 150.0–400.0)
RBC: 4.45 Mil/uL (ref 3.87–5.11)
RDW: 13.6 % (ref 11.5–15.5)
WBC: 8.5 10*3/uL (ref 4.0–10.5)

## 2024-02-08 MED ORDER — RIFAXIMIN 550 MG PO TABS: 550.0000 mg | ORAL_TABLET | Freq: Three times a day (TID) | ORAL | 0 refills | Status: AC

## 2024-02-08 MED ORDER — PRUCALOPRIDE SUCCINATE 2 MG PO TABS: 1.0000 | ORAL_TABLET | Freq: Every day | ORAL | 3 refills | Status: AC

## 2024-02-08 MED ORDER — FAMOTIDINE 40 MG PO TABS: 40.0000 mg | ORAL_TABLET | Freq: Every day | ORAL | 0 refills | Status: DC

## 2024-02-08 NOTE — Progress Notes (Signed)
 Attending Physician's Attestation   I have reviewed the chart.   I agree with the Advanced Practitioner's note, impression, and recommendations with any updates as below.    Corliss Parish, MD [facility] Advanced Endoscopy Office # [phone]

## 2024-02-08 NOTE — Patient Instructions (Signed)
 Your provider has requested that you go to the basement level for lab work before leaving today. Press "B" on the elevator. The lab is located at the first door on the left as you exit the elevator.  Small intestinal bacterial overgrowth (SIBO) occurs when there is an abnormal increase in the overall bacterial population in the small intestine -- particularly types of bacteria not commonly found in that part of the digestive tract. Small intestinal bacterial overgrowth (SIBO) commonly results when a circumstance -- such as surgery or disease -- slows the passage of food and waste products in the digestive tract, creating a breeding ground for bacteria.  Signs and symptoms of SIBO often include: Loss of appetite Abdominal pain Nausea Bloating An uncomfortable feeling of fullness after eating Diarrhea or constipation, depending on the type of gas produced  What foods trigger SIBO? While foods aren't the original cause of SIBO, certain foods do encourage the overgrowth of the wrong bacteria in your small intestine. If you're feeding them their favorite foods, they're going to grow more, and that will trigger more of your SIBO symptoms. By the same token, you can help reduce the overgrowth by starving the problematic bacteria of their favorite foods. This strategy has led to a number of proposed SIBO eating plans. The plans vary, and so do individual results. But in general, they tend to recommend limiting carbohydrates.  These include: Sugars and sweeteners. Fruits and starchy vegetables. Dairy products. Grains.  There is a test for this we can do called a breath test, if you are positive we will treat you with an antibiotic to see if it helps.  Your symptoms are very suspicious for this condition, as discussed, we will start you on an antibiotic to see if this helps.   Please take your proton pump inhibitor medication,   Please take this medication 30 minutes to 1 hour before meals- this  makes it more effective.  Avoid spicy and acidic foods Avoid fatty foods Limit your intake of coffee, tea, alcohol, and carbonated drinks Work to maintain a healthy weight Keep the head of the bed elevated at least 3 inches with blocks or a wedge pillow if you are having any nighttime symptoms Stay upright for 2 hours after eating Avoid meals and snacks three to four hours before bedtime    FODMAP stands for fermentable oligo-, di-, mono-saccharides and polyols (1). These are the scientific terms used to classify groups of carbs that are difficult for our body to digest and that are notorious for triggering digestive symptoms like bloating, gas, loose stools and stomach pain.   You can try low FODMAP diet  - start with eliminating just one column at a time that you feel may be a trigger for you. - the table at the very bottom contains foods that are low in FODMAPs   Sometimes trying to eliminate the FODMAP's from your diet is difficult or tricky, if you are stuggling with trying to do the elimination diet you can try an enzyme.  There is a food enzymes that you sprinkle in or on your food that helps break down the FODMAP. You can read more about the enzyme by going to this site: https://fodzyme.com/    Here some information about pelvic floor dysfunction. This may be contributing to some of your symptoms. We will continue with our evaluation but I do want you to consider adding on fiber supplement with low-dose MiraLAX daily. We could also refer to pelvic floor physical  therapy.   Pelvic Floor Dysfunction, Female Pelvic floor dysfunction (PFD) is a condition that results when the group of muscles and connective tissues that support the organs in the pelvis (pelvic floor muscles) do not work well. These muscles and their connections form a sling that supports the colon and bladder. In women, they also support the uterus. PFD causes pelvic floor muscles to be too weak, too tight, or  both. In PFD, muscle movements are not coordinated. This may cause bowel or bladder problems. It may also cause pain. What are the causes? This condition may be caused by an injury to the pelvic area or by a weakening of pelvic muscles. This often results from pregnancy and childbirth or other types of strain. In many cases, the exact cause is not known. What increases the risk? The following factors may make you more likely to develop this condition: Having chronic bladder tissue inflammation (interstitial cystitis). Being an older person. Being overweight. History of radiation treatment for cancer in the pelvic region. Previous pelvic surgery, such as removal of the uterus (hysterectomy). What are the signs or symptoms? Symptoms of this condition vary and may include: Bladder symptoms, such as: Trouble starting urination and emptying the bladder. Frequent urinary tract infections. Leaking urine when coughing, laughing, or exercising (stress incontinence). Having to pass urine urgently or frequently. Pain when passing urine. Bowel symptoms, such as: Constipation. Urgent or frequent bowel movements. Incomplete bowel movements. Painful bowel movements. Leaking stool or gas. Unexplained genital or rectal pain. Genital or rectal muscle spasms. Low back pain. Other symptoms may include: A heavy, full, or aching feeling in the vagina. A bulge that protrudes into the vagina. Pain during or after sex. How is this diagnosed? This condition may be diagnosed based on: Your symptoms and medical history. A physical exam. During the exam, your health care provider may check your pelvic muscles for tightness, spasm, pain, or weakness. This may include a rectal exam and a pelvic exam. In some cases, you may have diagnostic tests, such as: Electrical muscle function tests. Urine flow testing. X-ray tests of bowel function. Ultrasound of the pelvic organs. How is this treated? Treatment for  this condition depends on the symptoms. Treatment options include: Physical therapy. This may include Kegel exercises to help relax or strengthen the pelvic floor muscles. Biofeedback. This type of therapy provides feedback on how tight your pelvic floor muscles are so that you can learn to control them. Internal or external massage therapy. A treatment that involves electrical stimulation of the pelvic floor muscles to help control pain (transcutaneous electrical nerve stimulation, or TENS). Sound wave therapy (ultrasound) to reduce muscle spasms. Medicines, such as: Muscle relaxants. Bladder control medicines. Surgery to reconstruct or support pelvic floor muscles may be an option if other treatments do not help. Follow these instructions at home: Activity Do your usual activities as told by your health care provider. Ask your health care provider if you should modify any activities. Do pelvic floor strengthening or relaxing exercises at home as told by your physical therapist. Lifestyle Maintain a healthy weight. Eat foods that are high in fiber, such as beans, whole grains, and fresh fruits and vegetables. Limit foods that are high in fat and processed sugars, such as fried or sweet foods. Manage stress with relaxation techniques such as yoga or meditation. General instructions If you have problems with leakage: Use absorbable pads or wear padded underwear. Wash frequently with mild soap. Keep your genital and anal area as clean  and dry as possible. Ask your health care provider if you should try a barrier cream to prevent skin irritation. Take warm baths to relieve pelvic muscle tension or spasms. Take over-the-counter and prescription medicines only as told by your health care provider. Keep all follow-up visits. How is this prevented? The cause of PFD is not always known, but there are a few things you can do to reduce the risk of developing this condition, including: Staying at  a healthy weight. Getting regular exercise. Managing stress. Contact a health care provider if: Your symptoms are not improving with home care. You have signs or symptoms of PFD that get worse at home. You develop new signs or symptoms. You have signs of a urinary tract infection, such as: Fever. Chills. Increased urinary frequency. A burning feeling when urinating. You have not had a bowel movement in 3 days (constipation). Summary Pelvic floor dysfunction results when the muscles and connective tissues in your pelvic floor do not work well. These muscles and their connections form a sling that supports your colon and bladder. In women, they also support the uterus. PFD may be caused by an injury to the pelvic area or by a weakening of pelvic muscles. PFD causes pelvic floor muscles to be too weak, too tight, or a combination of both. Symptoms may vary from person to person. In most cases, PFD can be treated with physical therapies and medicines. Surgery may be an option if other treatments do not help. This information is not intended to replace advice given to you by your health care provider. Make sure you discuss any questions you have with your health care provider. Document Revised: 03/10/2021 Document Reviewed: 03/10/2021 Elsevier Patient Education  2022 [employer].

## 2024-03-02 ENCOUNTER — Other Ambulatory Visit: Payer: Self-pay | Admitting: Physician Assistant

## 2024-03-27 ENCOUNTER — Other Ambulatory Visit: Payer: Self-pay

## 2024-03-27 ENCOUNTER — Telehealth: Payer: Self-pay | Admitting: Adult Health

## 2024-03-27 DIAGNOSIS — F411 Generalized anxiety disorder: Secondary | ICD-10-CM

## 2024-03-27 DIAGNOSIS — F909 Attention-deficit hyperactivity disorder, unspecified type: Secondary | ICD-10-CM

## 2024-03-27 MED ORDER — ADDERALL XR 30 MG PO CP24: 30.0000 mg | ORAL_CAPSULE | Freq: Every day | ORAL | 0 refills | Status: DC

## 2024-03-27 NOTE — Telephone Encounter (Signed)
 Pt called requesting a  refill on her adderall  xr 30 mg. Pharmacy is [facility] on [address] in [place]

## 2024-03-27 NOTE — Telephone Encounter (Signed)
 Pended Adderall  XR 30 to [facility]

## 2024-04-04 NOTE — Progress Notes (Deleted)
 04/04/2024 Cristina Davenport [id number] [birth year]  Referring provider: Dorothe Gaster, NP Primary GI doctor: Dr. Brice Campi (Dr. Marvina Slough. [facility]le at [place])  ASSESSMENT AND PLAN:  Gastritis with LA grade esophagitis A 2 cm hiatal hernia  Status post cholecystectomy EGD 2023 gastritis and la grade A Denies GERD, low-dose Pepcid  20 mg added -  Ab Bloating with borborygmus Worse with processed foods, lactulose She has nausea occ, no fever, chills Normal colon, EGD with gastritis no GERD, not on PPI Given trial of Xifaxan  last OV for possible SIBO Referred to pelvic floor for possible pelvic floor dysfunction with urinary symptoms On Motegrity , ? Globalparesis? Consider GES  Benign colon polyp Colonoscopy 2023 recall age 52  History of constipation with internal hemorrhoids Failed linzess , miralax, dulcolax Has improved significantly with Motegrity   Patient Care Team: Dorothe Gaster, NP as PCP - General (Nurse Practitioner) Suzette Espy, MD as Consulting Physician (Gastroenterology)  HISTORY OF PRESENT ILLNESS: 38 y.o. female with a past medical history of PCOS, H. pylori, constipation, status post cholecystectomy, status post incisional hernia repair with Dr. Larrie Po and others listed below presents refilled medicines  04/30/2013 upper endoscopy showed abnormal distal esophagus biopsied, tiny hiatal hernia otherwise unremarkable.   Pathology consistent with GERD 03/26/2021 CT abdomen pelvis without contrast showed slightly complex 3.4 x 1.1 cm lipomatous region anterior abdominal soft tissues right of midline, recommended nonemergent MRI.  Had cecal diverticula.  Seen by [facility], unremarkable thoracic MRI, thought to be hemangioma of thoracic spine. 06/28/2022 colonoscopy and EGD Colonoscopy showed internal hemorrhoids normal TI 3 mm polyp rectosigmoid colon, hyperplastic polyp recall age 34 Upper endoscopy showed LA grade a esophagitis 2 cm hiatal  hernia erythematous mucosa in the antrum unremarkable duodenum, unremarkable for H. pylori or celiac.  Discussed the use of AI scribe software for clinical note transcription with the patient, who gave verbal consent to proceed.  History of Present Illness         9, 88 and 38 year old.  Current Medications:    Current Outpatient Medications (Cardiovascular):    Inositol Niacinate 500 MG TABS, Take 500 mg by mouth in the morning.   Current Outpatient Medications (Analgesics):    ketorolac  (TORADOL ) 10 MG tablet, Take 1 tablet (10 mg total) by mouth every 8 (eight) hours as needed. (Patient not taking: Reported on 02/08/2024)   oxyCODONE -acetaminophen  (PERCOCET) 7.5-325 MG tablet, Take 1 tablet by mouth every 6 (six) hours as needed. (Patient not taking: Reported on 02/08/2024)  Current Outpatient Medications (Hematological):    folic acid (FOLVITE) 800 MCG tablet, Take 400 mcg by mouth daily in the afternoon.  Current Outpatient Medications (Other):    [START ON 04/24/2024] ADDERALL  XR 30 MG 24 hr capsule, Take 1 capsule (30 mg total) by mouth daily.   ADDERALL  XR 30 MG 24 hr capsule, Take 1 capsule (30 mg total) by mouth daily.   amphetamine -dextroamphetamine  (ADDERALL ) 10 MG tablet, Take 1 tablet (10 mg total) by mouth daily.   amphetamine -dextroamphetamine  (ADDERALL ) 10 MG tablet, Take 1 tablet (10 mg total) by mouth daily.   ASHWAGANDHA PO, Take 2 capsules by mouth in the morning.   B Complex-C (SUPER B COMPLEX PO), Take 1 capsule by mouth in the morning.   clonazePAM  (KLONOPIN ) 0.5 MG tablet, Take 1 tablet (0.5 mg total) by mouth 2 (two) times daily as needed for anxiety.   famotidine  (PEPCID ) 40 MG tablet, TAKE 1 TABLET BY MOUTH EVERYDAY AT BEDTIME   L-LYSINE PO,  Take 1 capsule by mouth in the morning.   L-THEANINE PO, Take 1 tablet by mouth in the morning.   lamoTRIgine  (LAMICTAL ) 100 MG tablet, TAKE 1 TABLET BY MOUTH EVERYDAY AT BEDTIME   Magnesium  400 MG TABS, Take 400 mg by  mouth every evening.   Moringa Oleifera (MORINGA PO), Take 2 tablets by mouth every evening.   Multiple Vitamins-Minerals (HAIR SKIN NAILS PO), Take 1 tablet by mouth in the morning.   Multiple Vitamins-Minerals (ZINC PO), Take 1 tablet by mouth every evening.   ondansetron  (ZOFRAN -ODT) 8 MG disintegrating tablet, Take 1 tablet (8 mg total) by mouth every 8 (eight) hours as needed for nausea or vomiting.   OVER THE COUNTER MEDICATION, Take 1 capsule by mouth in the morning. [place]   Probiotic Product (DIGESTIVE ADV PREBIOT+PROBIOT PO), Take 1 capsule by mouth in the morning.   Prucalopride Succinate  (MOTEGRITY ) 2 MG TABS, Take 1 tablet (2 mg total) by mouth daily.   sertraline  (ZOLOFT ) 100 MG tablet, Take 2 tablets (200 mg total) by mouth daily.   silver  sulfADIAZINE  (SILVADENE ) 1 % cream, Apply to area 3 times daily (Patient taking differently: Apply 1 Application topically 3 (three) times daily as needed (irritation.).)   Simethicone  250 MG CAPS, Take 250 mg by mouth in the morning.   tretinoin (RETIN-A) 0.05 % cream, Apply 1 Application topically 3 (three) times a week.   trichloroacetic acid 80 % LIQD, Dispense for in office use   TURMERIC CURCUMIN PO, Take 1 capsule by mouth every evening. W/Ginger Powder  Medical History:  Past Medical History:  Diagnosis Date   Anxiety    Anxiety    Bipolar 1 disorder (HCC)    Depression    Family history of adverse reaction to anesthesia    GERD (gastroesophageal reflux disease)    IBS (irritable bowel syndrome)    Migraine without aura    Allergies:  Allergies  Allergen Reactions   Latex Other (See Comments)    General irritation   Doxycycline Rash    Solar rash     Surgical History:  She  has a past surgical history that includes Examination under anesthesia (04/21/2012); Esophagogastroduodenoscopy (N/A, 04/30/2013); Abdominal surgery; Cholecystectomy (N/A, 07/10/2013); Wisdom tooth extraction; Incisional hernia repair (N/A,  04/06/2021); and Robotic assisted total hysterectomy with bilateral salpingo oophorectomy (Bilateral, 11/03/2023). Family History:  Her family history includes Cancer in her father and maternal grandfather; Crohn's disease in her cousin; Hypertension in her maternal grandfather and maternal grandmother; Kidney failure in her maternal grandmother; Mental illness in her mother; Stroke in her maternal grandfather and maternal grandmother. She was adopted. Social History:   reports that she quit smoking about 9 years ago. Her smoking use included cigarettes. She started smoking about 17 years ago. She has a 4 pack-year smoking history. She has never used smokeless tobacco. She reports current alcohol use of about 3.0 standard drinks of alcohol per week. She reports current drug use. Drug: Marijuana.  REVIEW OF SYSTEMS  : All other systems reviewed and negative except where noted in the History of Present Illness.   PHYSICAL EXAM: LMP 09/06/2023  General:   Pleasant, well developed female in no acute distress Head:   Normocephalic and atraumatic. Eyes:  sclerae anicteric,conjunctive pink  Heart:   regular rate and rhythm Pulm:  Clear anteriorly; no wheezing Abdomen:   Soft, Non-distended AB, Sluggish bowel sounds. mild tenderness in the epigastrium and in the lower abdomen. Without guarding and Without rebound, No organomegaly appreciated.  Rectal: Not evaluated Extremities:  Without edema. Msk: Symmetrical without gross deformities. Peripheral pulses intact.  Neurologic:  Alert and  oriented x4;  No focal deficits.  Skin:   Dry and intact without significant lesions or rashes. Psychiatric:  Cooperative. Normal mood and affect.    Edmonia Gottron, PA-C 12:30 PM

## 2024-04-09 ENCOUNTER — Ambulatory Visit: Admitting: Physician Assistant

## 2024-04-26 ENCOUNTER — Telehealth: Admitting: Adult Health

## 2024-04-30 ENCOUNTER — Other Ambulatory Visit: Payer: Self-pay | Admitting: Adult Health

## 2024-04-30 DIAGNOSIS — F331 Major depressive disorder, recurrent, moderate: Secondary | ICD-10-CM

## 2024-04-30 DIAGNOSIS — F411 Generalized anxiety disorder: Secondary | ICD-10-CM

## 2024-05-09 ENCOUNTER — Encounter: Payer: Self-pay | Admitting: Adult Health

## 2024-05-09 ENCOUNTER — Telehealth: Admitting: Adult Health

## 2024-05-09 DIAGNOSIS — F411 Generalized anxiety disorder: Secondary | ICD-10-CM | POA: Diagnosis not present

## 2024-05-09 DIAGNOSIS — F909 Attention-deficit hyperactivity disorder, unspecified type: Secondary | ICD-10-CM | POA: Diagnosis not present

## 2024-05-09 DIAGNOSIS — F422 Mixed obsessional thoughts and acts: Secondary | ICD-10-CM

## 2024-05-09 DIAGNOSIS — F331 Major depressive disorder, recurrent, moderate: Secondary | ICD-10-CM | POA: Diagnosis not present

## 2024-05-09 MED ORDER — AMPHETAMINE-DEXTROAMPHETAMINE 10 MG PO TABS: 10.0000 mg | ORAL_TABLET | Freq: Every day | ORAL | 0 refills | Status: DC

## 2024-05-09 NOTE — Progress Notes (Signed)
 Cristina Davenport [id number] 1986/02/13 37 y.o.  Virtual Visit via Video Note  I connected with pt @ on 05/09/24 at  9:00 AM EDT by a video enabled telemedicine application and verified that I am speaking with the correct person using two identifiers.   I discussed the limitations of evaluation and management by telemedicine and the availability of in person appointments. The patient expressed understanding and agreed to proceed.  I discussed the assessment and treatment plan with the patient. The patient was provided an opportunity to ask questions and all were answered. The patient agreed with the plan and demonstrated an understanding of the instructions.   The patient was advised to call back or seek an in-person evaluation if the symptoms worsen or if the condition fails to improve as anticipated.  I provided 25 minutes of non-face-to-face time during this encounter.  The patient was located at home.  The provider was located at [facility].   Cristina Cristina Sayers, NP   Subjective:   Patient ID:  Cristina Davenport is a 38 y.o. (DOB 1986-09-23) female.  Chief Complaint: No chief complaint on file.   HPI Cristina Davenport presents for follow-up of ADHD, MDD, GAD, and mixed obsessional thoughts and acts.   Describes mood today as better. Pleasant. Mood symptoms - reports  a general depression, anxiety and irritability. Reports lower interest and motivation. Reports feeling overwhelmed at times. Reports panic attacks - so much stress. Reports worry, rumination and over thinking - all the time. Reports obsessive thoughts and acts - cleaning. Mood is variable - my mood goes up and down. Stating I feel like I'm stuck. Reports medications are helpful. Taking medications as prescribed. Energy levels lower - having to push myself. Active, has a regular exercise routine. Enjoys some usual interests and activities. Married. Lives with husband. Has 3 children - dog. Family local.  Spending time with family. Appetite adequate. Weight gain - 119 from 128 pounds. Sleeps better some nights than others. Averages 5 to 6 hours of broken sleep.   Reports focus and concentration difficulties. Reports the Adderall  is helpful. Completing tasks. Managing aspects of household. Works in Research officer, political party.  Denies SI or HI.  Denies AH or VH. Denies self harm. Reports disassociation. Denies substance use.  Previous medication trials: Zoloft , Hydroxyzine , Adderall  XR 20mg  daily, Lamictal   Review of Systems:  Review of Systems  Musculoskeletal:  Negative for gait problem.  Neurological:  Negative for tremors.  Psychiatric/Behavioral:         Please refer to HPI    Medications: I have reviewed the patient's current medications.  Current Outpatient Medications  Medication Sig Dispense Refill   sertraline  (ZOLOFT ) 100 MG tablet TAKE 2 TABLETS BY MOUTH EVERY DAY 180 tablet 0   ADDERALL  XR 30 MG 24 hr capsule Take 1 capsule (30 mg total) by mouth daily. 30 capsule 0   ADDERALL  XR 30 MG 24 hr capsule Take 1 capsule (30 mg total) by mouth daily. 30 capsule 0   amphetamine -dextroamphetamine  (ADDERALL ) 10 MG tablet Take 1 tablet (10 mg total) by mouth daily. 30 tablet 0   amphetamine -dextroamphetamine  (ADDERALL ) 10 MG tablet Take 1 tablet (10 mg total) by mouth daily. 30 tablet 0   ASHWAGANDHA PO Take 2 capsules by mouth in the morning.     B Complex-C (SUPER B COMPLEX PO) Take 1 capsule by mouth in the morning.     clonazePAM  (KLONOPIN ) 0.5 MG tablet Take 1 tablet (0.5 mg total) by mouth 2 (  two) times daily as needed for anxiety. 60 tablet 2   famotidine  (PEPCID ) 40 MG tablet TAKE 1 TABLET BY MOUTH EVERYDAY AT BEDTIME 90 tablet 1   folic acid (FOLVITE) 800 MCG tablet Take 400 mcg by mouth daily in the afternoon.     Inositol Niacinate 500 MG TABS Take 500 mg by mouth in the morning.     ketorolac  (TORADOL ) 10 MG tablet Take 1 tablet (10 mg total) by mouth every 8 (eight) hours as needed.  (Patient not taking: Reported on 02/08/2024) 15 tablet 0   L-LYSINE PO Take 1 capsule by mouth in the morning.     L-THEANINE PO Take 1 tablet by mouth in the morning.     lamoTRIgine  (LAMICTAL ) 100 MG tablet TAKE 1 TABLET BY MOUTH EVERYDAY AT BEDTIME 30 tablet 5   Magnesium  400 MG TABS Take 400 mg by mouth every evening.     Moringa Oleifera (MORINGA PO) Take 2 tablets by mouth every evening.     Multiple Vitamins-Minerals (HAIR SKIN NAILS PO) Take 1 tablet by mouth in the morning.     Multiple Vitamins-Minerals (ZINC PO) Take 1 tablet by mouth every evening.     ondansetron  (ZOFRAN -ODT) 8 MG disintegrating tablet Take 1 tablet (8 mg total) by mouth every 8 (eight) hours as needed for nausea or vomiting. 8 tablet 0   OVER THE COUNTER MEDICATION Take 1 capsule by mouth in the morning. [place]     oxyCODONE -acetaminophen  (PERCOCET) 7.5-325 MG tablet Take 1 tablet by mouth every 6 (six) hours as needed. (Patient not taking: Reported on 02/08/2024) 28 tablet 0   Probiotic Product (DIGESTIVE ADV PREBIOT+PROBIOT PO) Take 1 capsule by mouth in the morning.     Prucalopride Succinate  (MOTEGRITY ) 2 MG TABS Take 1 tablet (2 mg total) by mouth daily. 90 tablet 3   silver  sulfADIAZINE  (SILVADENE ) 1 % cream Apply to area 3 times daily (Patient taking differently: Apply 1 Application topically 3 (three) times daily as needed (irritation.).) 50 g 11   Simethicone  250 MG CAPS Take 250 mg by mouth in the morning.     tretinoin (RETIN-A) 0.05 % cream Apply 1 Application topically 3 (three) times a week.     trichloroacetic acid 80 % LIQD Dispense for in office use 15 mL 0   TURMERIC CURCUMIN PO Take 1 capsule by mouth every evening. W/Ginger Powder     No current facility-administered medications for this visit.    Medication Side Effects: None  Allergies:  Allergies  Allergen Reactions   Latex Other (See Comments)    General irritation   Doxycycline Rash    Solar rash    Past Medical History:   Diagnosis Date   Anxiety    Anxiety    Bipolar 1 disorder (HCC)    Depression    Family history of adverse reaction to anesthesia    GERD (gastroesophageal reflux disease)    IBS (irritable bowel syndrome)    Migraine without aura     Family History  Adopted: Yes  Problem Relation Age of Onset   Mental illness Mother    Cancer Father        skin cancer   Hypertension Maternal Grandmother    Stroke Maternal Grandmother    Kidney failure Maternal Grandmother    Hypertension Maternal Grandfather    Stroke Maternal Grandfather    Cancer Maternal Grandfather        skin   Crohn's disease Cousin    Colon  cancer Neg Hx    Rectal cancer Neg Hx    Stomach cancer Neg Hx     Social History   Socioeconomic History   Marital status: Married    Spouse name: Tynisha Ogan   Number of children: 3   Years of education: 12   Highest education level: Some college, no degree  Occupational History   Occupation: Magazine features editor   Occupation: realtor  Tobacco Use   Smoking status: Former    Current packs/day: 0.00    Average packs/day: 0.5 packs/day for 8.0 years (4.0 ttl pk-yrs)    Types: Cigarettes    Start date: 09/19/2006    Quit date: 09/19/2014    Years since quitting: 9.6   Smokeless tobacco: Never  Vaping Use   Vaping status: Every Day   Substances: Nicotine, Flavoring  Substance and Sexual Activity   Alcohol use: Yes    Alcohol/week: 3.0 standard drinks of alcohol    Types: 3 Glasses of wine per week    Comment: scoially   Drug use: Yes    Types: Marijuana    Comment: for pain in the back   Sexual activity: Yes    Partners: Male    Birth control/protection: Pill  Other Topics Concern   Not on file  Social History Narrative   Married since July 2020,been together for 10 years.Lives with husband and kids.   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   Social Drivers of Corporate investment banker Strain: Low Risk  (12/29/2021)   Overall  Financial Resource Strain (CARDIA)    Difficulty of Paying Living Expenses: Not hard at all  Food Insecurity: No Food Insecurity (12/29/2021)   Hunger Vital Sign    Worried About Running Out of Food in the Last Year: Never true    Ran Out of Food in the Last Year: Never true  Transportation Needs: No Transportation Needs (12/29/2021)   PRAPARE - Administrator, Civil Service (Medical): No    Lack of Transportation (Non-Medical): No  Physical Activity: Sufficiently Active (12/29/2021)   Exercise Vital Sign    Days of Exercise per Week: 5 days    Minutes of Exercise per Session: 40 min  Stress: Stress Concern Present (12/29/2021)   [employer] of Occupational Health - Occupational Stress Questionnaire    Feeling of Stress : Very much  Social Connections: Unknown (12/29/2021)   Social Connection and Isolation Panel    Frequency of Communication with Friends and Family: More than three times a week    Frequency of Social Gatherings with Friends and Family: Once a week    Attends Religious Services: Patient declined    Database administrator or Organizations: No    Attends Engineer, structural: More than 4 times per year    Marital Status: Married  Catering manager Violence: Not At Risk (12/29/2021)   Humiliation, Afraid, Rape, and Kick questionnaire    Fear of Current or Ex-Partner: No    Emotionally Abused: No    Physically Abused: No    Sexually Abused: No    Past Medical History, Surgical history, Social history, and Family history were reviewed and updated as appropriate.   Please see review of systems for further details on the patient's review from today.   Objective:   Physical Exam:  LMP 09/06/2023   Physical Exam Constitutional:      General: She is not in acute distress.  Musculoskeletal:  General: No deformity.   Neurological:     Mental Status: She is alert and oriented to person, place, and time.     Coordination: Coordination  normal.   Psychiatric:        Attention and Perception: Attention and perception normal. She does not perceive auditory or visual hallucinations.        Mood and Affect: Mood normal. Mood is not anxious or depressed. Affect is not labile, blunt, angry or inappropriate.        Speech: Speech normal.        Behavior: Behavior normal.        Thought Content: Thought content normal. Thought content is not paranoid or delusional. Thought content does not include homicidal or suicidal ideation. Thought content does not include homicidal or suicidal plan.        Cognition and Memory: Cognition and memory normal.        Judgment: Judgment normal.     Comments: Insight intact     Lab Review:     Component Value Date/Time   NA 137 02/08/2024 1027   NA 137 12/29/2021 0959   K 4.1 02/08/2024 1027   CL 103 02/08/2024 1027   CO2 28 02/08/2024 1027   GLUCOSE 59 (L) 02/08/2024 1027   BUN 14 02/08/2024 1027   BUN 9 12/29/2021 0959   CREATININE 0.69 02/08/2024 1027   CALCIUM 9.0 02/08/2024 1027   PROT 7.0 02/08/2024 1027   PROT 7.2 12/29/2021 0959   ALBUMIN 4.5 02/08/2024 1027   ALBUMIN 4.9 (H) 12/29/2021 0959   AST 15 02/08/2024 1027   ALT 16 02/08/2024 1027   ALKPHOS 47 02/08/2024 1027   BILITOT 0.3 02/08/2024 1027   BILITOT 0.3 12/29/2021 0959   GFRNONAA >60 11/02/2023 1135   GFRAA >60 10/15/2019 0909       Component Value Date/Time   WBC 8.5 02/08/2024 1027   RBC 4.45 02/08/2024 1027   HGB 13.3 02/08/2024 1027   HGB 14.7 12/29/2021 0959   HGB 13.3 09/23/2015 1638   HCT 39.9 02/08/2024 1027   HCT 42.9 12/29/2021 0959   PLT 312.0 02/08/2024 1027   PLT 260 12/29/2021 0959   MCV 89.6 02/08/2024 1027   MCV 89 12/29/2021 0959   MCH 30.2 11/02/2023 1135   MCHC 33.3 02/08/2024 1027   RDW 13.6 02/08/2024 1027   RDW 12.4 12/29/2021 0959   LYMPHSABS 2.3 02/08/2024 1027   LYMPHSABS 2.0 05/10/2019 1445   MONOABS 0.7 02/08/2024 1027   EOSABS 0.3 02/08/2024 1027   EOSABS 0.1  05/10/2019 1445   BASOSABS 0.1 02/08/2024 1027   BASOSABS 0.0 05/10/2019 1445    No results found for: POCLITH, LITHIUM   No results found for: PHENYTOIN, PHENOBARB, VALPROATE, CBMZ   .res Assessment: Plan:    Plan:  PDMP reviewed  Adderall  10mg  daily  Adderall  XR 30mg  daily Zoloft  200mg  daily Lamictal  100mg  daily Clonazepam  0.5mg  daily to BID prn - uses as needed   RTC 2 months  25 minutes spent dedicated to the care of this patient on the date of this encounter to include pre-visit review of records, ordering of medication, post visit documentation, and face-to-face time with the patient discussing ADHD, MDD, GAD and obsessional thoughts and acts. Discussed continuing current medication regimen.  Monitor BP between visits while taking stimulant medication.   Patient advised to contact office with any questions, adverse effects, or acute worsening in signs and symptoms.  Discussed potential benefits, risks, and side effects of stimulants  with patient to include increased heart rate, palpitations, insomnia, increased anxiety, increased irritability, or decreased appetite. Instructed patient to contact office if experiencing any significant tolerability issues.  Discussed potential benefits, risk, and side effects of benzodiazepines to include potential risk of tolerance and dependence, as well as possible drowsiness. Advised patient not to drive if experiencing drowsiness and to take lowest possible effective dose to minimize risk of dependence and tolerance.   Counseled patient regarding potential benefits, risks, and side effects of Lamictal  to include potential risk of Stevens-Johnson syndrome. Advised patient to stop taking Lamictal  and contact office immediately if rash develops and to seek urgent medical attention if rash is severe and/or spreading quickly.    Diagnoses and all orders for this visit:  Major depressive disorder, recurrent episode, moderate  (HCC)  Attention deficit hyperactivity disorder (ADHD), unspecified ADHD type -     amphetamine -dextroamphetamine  (ADDERALL ) 10 MG tablet; Take 1 tablet (10 mg total) by mouth daily.  Generalized anxiety disorder  Mixed obsessional thoughts and acts     Please see After Visit Summary for patient specific instructions.  Future Appointments  Date Time Provider Department Center  05/30/2024  8:30 AM Elnor Channing FALCON, PT [facility]  06/06/2024  9:30 AM Elnor Channing FALCON, PT [facility]  06/11/2024  8:30 AM Elnor Channing FALCON, PT [facility]  06/20/2024  9:30 AM Elnor Channing FALCON, PT [facility]    No orders of the defined types were placed in this encounter.     -------------------------------

## 2024-05-30 ENCOUNTER — Other Ambulatory Visit: Payer: Self-pay

## 2024-05-30 ENCOUNTER — Encounter: Attending: Physician Assistant | Admitting: Physical Therapy

## 2024-05-30 ENCOUNTER — Encounter: Payer: Self-pay | Admitting: Physical Therapy

## 2024-05-30 DIAGNOSIS — M25551 Pain in right hip: Secondary | ICD-10-CM | POA: Insufficient documentation

## 2024-05-30 DIAGNOSIS — M5459 Other low back pain: Secondary | ICD-10-CM | POA: Diagnosis present

## 2024-05-30 DIAGNOSIS — M25552 Pain in left hip: Secondary | ICD-10-CM | POA: Diagnosis present

## 2024-05-30 DIAGNOSIS — M6281 Muscle weakness (generalized): Secondary | ICD-10-CM | POA: Insufficient documentation
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
## 2024-06-04 ENCOUNTER — Other Ambulatory Visit: Payer: Self-pay

## 2024-06-04 ENCOUNTER — Telehealth: Payer: Self-pay | Admitting: Adult Health

## 2024-06-04 DIAGNOSIS — F909 Attention-deficit hyperactivity disorder, unspecified type: Secondary | ICD-10-CM

## 2024-06-04 MED ORDER — ADDERALL XR 30 MG PO CP24: 30.0000 mg | ORAL_CAPSULE | Freq: Every day | ORAL | 0 refills | Status: DC

## 2024-06-04 NOTE — Telephone Encounter (Signed)
 Pt requesting Adderall  XR 30 mg and Dextroamphetamine  10 mg at [facility] Dr. Irene 8/26

## 2024-06-04 NOTE — Telephone Encounter (Signed)
 05/09/2024 05/09/2024 1  Dextroamp-Amphetamin 10 Mg Tab 30.00 30 Re Moz [id number] Nor (2541) 0/0  Comm Ins [place] 04/30/2024 03/27/2024 1  Adderall  Xr 30 Mg Capsule 30.00  Will pend Adderall  XR30 today. 10 mg IR not due until 7/24.

## 2024-06-06 ENCOUNTER — Other Ambulatory Visit: Payer: Self-pay

## 2024-06-06 ENCOUNTER — Encounter: Admitting: Physical Therapy

## 2024-06-06 ENCOUNTER — Encounter: Payer: Self-pay | Admitting: Physical Therapy

## 2024-06-06 DIAGNOSIS — M25552 Pain in left hip: Secondary | ICD-10-CM

## 2024-06-06 DIAGNOSIS — M6281 Muscle weakness (generalized): Secondary | ICD-10-CM | POA: Diagnosis not present

## 2024-06-06 DIAGNOSIS — F411 Generalized anxiety disorder: Secondary | ICD-10-CM

## 2024-06-06 DIAGNOSIS — M25551 Pain in right hip: Secondary | ICD-10-CM

## 2024-06-06 DIAGNOSIS — M5459 Other low back pain: Secondary | ICD-10-CM

## 2024-06-06 MED ORDER — AMPHETAMINE-DEXTROAMPHETAMINE 10 MG PO TABS: 10.0000 mg | ORAL_TABLET | Freq: Every day | ORAL | 0 refills | Status: DC
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
## 2024-06-06 NOTE — Telephone Encounter (Signed)
 Pended Adderall 10 mg

## 2024-06-11 ENCOUNTER — Encounter: Admitting: Physical Therapy

## 2024-06-17 ENCOUNTER — Ambulatory Visit
Admission: EM | Admit: 2024-06-17 | Discharge: 2024-06-17 | Disposition: A | Discharge status: Home / Self Care | Attending: Family Medicine | Admitting: Family Medicine

## 2024-06-17 DIAGNOSIS — M79641 Pain in right hand: Secondary | ICD-10-CM | POA: Diagnosis not present

## 2024-06-17 DIAGNOSIS — W5501XA Bitten by cat, initial encounter: Secondary | ICD-10-CM

## 2024-06-17 MED ORDER — CHLORHEXIDINE GLUCONATE 4 % EX SOLN: Freq: Every day | CUTANEOUS | 0 refills | Status: DC | PRN

## 2024-06-17 MED ORDER — BACITRACIN 500 UNIT/GM EX OINT
1.0000 | TOPICAL_OINTMENT | Freq: Once | CUTANEOUS | Status: AC
  Administered 2024-06-17: 1 via TOPICAL

## 2024-06-17 MED ORDER — AMOXICILLIN-POT CLAVULANATE 875-125 MG PO TABS: 1.0000 | ORAL_TABLET | Freq: Two times a day (BID) | ORAL | 0 refills | Status: DC

## 2024-06-17 MED ORDER — MUPIROCIN 2 % EX OINT: 1.0000 | TOPICAL_OINTMENT | Freq: Two times a day (BID) | CUTANEOUS | 0 refills | Status: DC

## 2024-06-17 NOTE — Discharge Instructions (Signed)
 Clean the area at least once a day with the Hibiclens  solution and apply the mupirocin  ointment and a nonstick dressing.  Take the full course of antibiotics.  Elevate at rest and ice off-and-on to help with swelling.  Over-the-counter pain relievers as needed.  You are up-to-date on your tetanus shot.  If you decide you want to go through with the rabies series, per the CDC you have 48 to 72 hours to start the series after exposure

## 2024-06-17 NOTE — ED Provider Notes (Signed)
 [facility]    CSN: [id number] Arrival date & time: 06/17/24  1938      History   Chief Complaint Chief Complaint  Patient presents with   Animal Bite    HPI Cristina Davenport is a 38 y.o. female.   Patient presenting today with 1 day history of right hand pain and swelling from a cat bite to the palm just below the first finger.  States she was bitten by a stray cat around 2 PM whenever the cat saw a dog and this having progressively worsening swelling and pain to the site.  Denies bleeding, drainage, numbness, tingling, loss of range of motion.  So far cleaning with soap and water , otherwise not try anything over-the-counter for symptoms.  Last tetanus shot was 08-2019.  Unsure of rabies status for the cat.    Past Medical History:  Diagnosis Date   Anxiety    Anxiety    Bipolar 1 disorder (HCC)    Depression    Family history of adverse reaction to anesthesia    GERD (gastroesophageal reflux disease)    IBS (irritable bowel syndrome)    Migraine without aura     Patient Active Problem List   Diagnosis Date Noted   Menorrhagia with regular cycle 11/03/2023   Dysmenorrhea 11/03/2023   Dyspareunia, female 11/03/2023   Shortness of breath 01/04/2023   COVID-19 01/04/2023   Pain in joint, multiple sites 11/24/2022   Other fatigue 11/24/2022   Restless leg 11/24/2022   Incisional hernia, without obstruction or gangrene    Abdominal wall mass 04/01/2021   Attention deficit hyperactivity disorder (ADHD), combined type 03/20/2020   Encounter for IUD insertion 02/24/2020   Genital warts 06/21/2019   HSV-1 infection 05/10/2019   Post partum depression 01/05/2018   Hx of preeclampsia, prior pregnancy, currently pregnant 02/16/2017   Abdominal bloating 03/14/2016   Migraine headache 03/12/2016   Anxiety 04/17/2015   Low grade squamous intraepithelial lesion on cytologic smear of cervix (LGSIL) 09/15/2014   Adjustment disorder with anxious mood 09/14/2014     Past Surgical History:  Procedure Laterality Date   ABDOMINAL SURGERY     cyst removed    CHOLECYSTECTOMY N/A 07/10/2013   Procedure: LAPAROSCOPIC CHOLECYSTECTOMY;  Surgeon: Thresa JAYSON Pulling, MD;  Location: [facility];  Service: General;  Laterality: N/A;   ESOPHAGOGASTRODUODENOSCOPY N/A 04/30/2013   MFM:Jawnmfjo distal esophagus of uncertain clinical significance-status post biopsy. Tiny hiatal hernia. No explanation   EXAMINATION UNDER ANESTHESIA  04/21/2012   Gluteal wound repair   INCISIONAL HERNIA REPAIR N/A 04/06/2021   Procedure: INCISIONAL HERNIORRHAPHY;  Surgeon: Mavis Anes, MD;  Location: [facility];  Service: General;  Laterality: N/A;   ROBOTIC ASSISTED TOTAL HYSTERECTOMY WITH BILATERAL SALPINGO OOPHERECTOMY Bilateral 11/03/2023   Procedure: XI ROBOTIC ASISTED TOTAL HYSTERECTMY WITH BILATERAL SALPINGECTOMY;  Surgeon: Jayne Vonn DEL, MD;  Location: [facility];  Service: Gynecology;  Laterality: Bilateral;   WISDOM TOOTH EXTRACTION      OB History     Gravida  4   Para  3   Term  2   Preterm  1   AB  1   Living  3      SAB  1   IAB  0   Ectopic  0   Multiple  0   Live Births  3            Home Medications    Prior to Admission medications   Medication Sig Start Date End Date Taking?  Authorizing Provider  ADDERALL  XR 30 MG 24 hr capsule Take 1 capsule (30 mg total) by mouth daily. 04/24/24  Yes Mozingo, Regina Nattalie, NP  amoxicillin -clavulanate (AUGMENTIN ) 875-125 MG tablet Take 1 tablet by mouth every 12 (twelve) hours. 06/17/24  Yes Stuart Vernell Norris, PA-C  ASHWAGANDHA PO Take 2 capsules by mouth in the morning.   Yes [provider]  chlorhexidine  (HIBICLENS ) 4 % external liquid Apply topically daily as needed. 06/17/24  Yes Stuart Vernell Norris, PA-C  clonazePAM  (KLONOPIN ) 0.5 MG tablet Take 1 tablet (0.5 mg total) by mouth 2 (two) times daily as needed for anxiety. 01/25/24  Yes Mozingo, Regina Nattalie, NP  mupirocin  ointment (BACTROBAN ) 2  % Apply 1 Application topically 2 (two) times daily. 06/17/24  Yes Stuart Vernell Norris, PA-C  sertraline  (ZOLOFT ) 100 MG tablet TAKE 2 TABLETS BY MOUTH EVERY DAY 04/30/24  Yes Mozingo, Regina Nattalie, NP  ADDERALL  XR 30 MG 24 hr capsule Take 1 capsule (30 mg total) by mouth daily. 06/04/24   Mozingo, Regina Nattalie, NP  ADDERALL  XR 30 MG 24 hr capsule Take 1 capsule (30 mg total) by mouth daily. 07/02/24   Mozingo, Regina Nattalie, NP  amphetamine -dextroamphetamine  (ADDERALL ) 10 MG tablet Take 1 tablet (10 mg total) by mouth daily. 05/09/24   Mozingo, Regina Nattalie, NP  amphetamine -dextroamphetamine  (ADDERALL ) 10 MG tablet Take 1 tablet (10 mg total) by mouth daily. 06/06/24   Mozingo, Regina Nattalie, NP  amphetamine -dextroamphetamine  (ADDERALL ) 10 MG tablet Take 1 tablet (10 mg total) by mouth daily. 07/04/24   Mozingo, Regina Nattalie, NP  B Complex-C (SUPER B COMPLEX PO) Take 1 capsule by mouth in the morning.    [provider]  famotidine  (PEPCID ) 40 MG tablet TAKE 1 TABLET BY MOUTH EVERYDAY AT BEDTIME 03/04/24   Collier, Amanda R, PA-C  folic acid (FOLVITE) 800 MCG tablet Take 400 mcg by mouth daily in the afternoon.    [provider]  Inositol Niacinate 500 MG TABS Take 500 mg by mouth in the morning.    [provider]  ketorolac  (TORADOL ) 10 MG tablet Take 1 tablet (10 mg total) by mouth every 8 (eight) hours as needed. 11/03/23   Jayne Vonn DEL, MD  L-LYSINE PO Take 1 capsule by mouth in the morning.    [provider]  L-THEANINE PO Take 1 tablet by mouth in the morning.    [provider]  lamoTRIgine  (LAMICTAL ) 100 MG tablet TAKE 1 TABLET BY MOUTH EVERYDAY AT BEDTIME 01/25/24   Mozingo, Regina Nattalie, NP  Magnesium  400 MG TABS Take 400 mg by mouth every evening.    [provider]  Moringa Oleifera (MORINGA PO) Take 2 tablets by mouth every evening.    [provider]  Multiple Vitamins-Minerals (HAIR SKIN NAILS PO) Take 1  tablet by mouth in the morning.    [provider]  Multiple Vitamins-Minerals (ZINC PO) Take 1 tablet by mouth every evening.    [provider]  ondansetron  (ZOFRAN -ODT) 8 MG disintegrating tablet Take 1 tablet (8 mg total) by mouth every 8 (eight) hours as needed for nausea or vomiting. 11/03/23   Jayne Vonn DEL, MD  OVER THE COUNTER MEDICATION Take 1 capsule by mouth in the morning. [place]    [provider]  oxyCODONE -acetaminophen  (PERCOCET) 7.5-325 MG tablet Take 1 tablet by mouth every 6 (six) hours as needed. 11/03/23   Jayne Vonn DEL, MD  Probiotic Product (DIGESTIVE ADV PREBIOT+PROBIOT PO) Take 1 capsule by mouth  in the morning.    [provider]  Prucalopride Succinate  (MOTEGRITY ) 2 MG TABS Take 1 tablet (2 mg total) by mouth daily. 02/08/24   Craig Alan SAUNDERS, PA-C  silver  sulfADIAZINE  (SILVADENE ) 1 % cream Apply to area 3 times daily Patient taking differently: Apply 1 Application topically 3 (three) times daily as needed (irritation.). 06/12/23   Jayne Vonn DEL, MD  Simethicone  250 MG CAPS Take 250 mg by mouth in the morning.    [provider]  tretinoin (RETIN-A) 0.05 % cream Apply 1 Application topically 3 (three) times a week. 11/15/22   [provider]  trichloroacetic acid 80 % LIQD Dispense for in office use 12/29/21   Kizzie Suzen SAUNDERS, CNM  TURMERIC CURCUMIN PO Take 1 capsule by mouth every evening. W/Ginger Powder    [provider]    Family History Family History  Adopted: Yes  Problem Relation Age of Onset   Mental illness Mother    Cancer Father        skin cancer   Hypertension Maternal Grandmother    Stroke Maternal Grandmother    Kidney failure Maternal Grandmother    Hypertension Maternal Grandfather    Stroke Maternal Grandfather    Cancer Maternal Grandfather        skin   Crohn's disease Cousin    Colon cancer Neg Hx    Rectal cancer Neg Hx    Stomach cancer Neg Hx     Social  History Social History   Tobacco Use   Smoking status: Former    Current packs/day: 0.00    Average packs/day: 0.5 packs/day for 8.0 years (4.0 ttl pk-yrs)    Types: Cigarettes    Start date: 09/19/2006    Quit date: 09/19/2014    Years since quitting: 9.7   Smokeless tobacco: Never  Vaping Use   Vaping status: Every Day   Substances: Nicotine, Flavoring  Substance Use Topics   Alcohol use: Yes    Alcohol/week: 3.0 standard drinks of alcohol    Types: 3 Glasses of wine per week    Comment: scoially   Drug use: Yes    Types: Marijuana    Comment: for pain in the back     Allergies   Latex and Doxycycline   Review of Systems Review of Systems Per HPI  Physical Exam Triage Vital Signs ED Triage Vitals  Encounter Vitals Group     BP 06/17/24 1952 110/67     Girls Systolic BP Percentile --      Girls Diastolic BP Percentile --      Boys Systolic BP Percentile --      Boys Diastolic BP Percentile --      Pulse Rate 06/17/24 1952 81     Resp 06/17/24 1952 16     Temp 06/17/24 1952 98.3 F (36.8 C)     Temp Source 06/17/24 1952 Oral     SpO2 06/17/24 1952 99 %     Weight --      Height --      Head Circumference --      Peak Flow --      Pain Score 06/17/24 1953 7     Pain Loc --      Pain Education --      Exclude from Growth Chart --    No data found.  Updated Vital Signs BP 110/67 (BP Location: Right Arm)   Pulse 81   Temp 98.3 F (36.8 C) (Oral)  Resp 16   LMP 09/06/2023   SpO2 99%   Visual Acuity Right Eye Distance:   Left Eye Distance:   Bilateral Distance:    Right Eye Near:   Left Eye Near:    Bilateral Near:     Physical Exam Vitals and nursing note reviewed.  Constitutional:      Appearance: Normal appearance. She is not ill-appearing.  HENT:     Head: Atraumatic.  Eyes:     Extraocular Movements: Extraocular movements intact.     Conjunctiva/sclera: Conjunctivae normal.  Cardiovascular:     Rate and Rhythm: Normal rate and  regular rhythm.     Heart sounds: Normal heart sounds.  Pulmonary:     Effort: Pulmonary effort is normal.     Breath sounds: Normal breath sounds.  Musculoskeletal:        General: Normal range of motion.     Cervical back: Normal range of motion and neck supple.  Skin:    General: Skin is warm and dry.     Findings: Erythema present.     Comments: Small puncture wound present to the palm of the right hand just below the index finger.  Erythematous, edematous to this area  Neurological:     Mental Status: She is alert and oriented to person, place, and time.     Comments: Right hand neurovascularly intact  Psychiatric:        Mood and Affect: Mood normal.        Thought Content: Thought content normal.        Judgment: Judgment normal.      UC Treatments / Results  Labs (all labs ordered are listed, but only abnormal results are displayed) Labs Reviewed - No data to display  EKG   Radiology No results found.  Procedures Procedures (including critical care time)  Medications Ordered in UC Medications  bacitracin  ointment 1 Application (has no administration in time range)    Initial Impression / Assessment and Plan / UC Course  I have reviewed the triage vital signs and the nursing notes.  Pertinent labs & imaging results that were available during my care of the patient were reviewed by me and considered in my medical decision making (see chart for details).     Wounds cleaned and dressed today, tetanus up-to-date.  Declines rabies series at this time but will consider her options and return if changing her mind.  Will treat with Augmentin , Hibiclens , mupirocin  and discussed supportive over-the-counter medications at home care.  Return for worsening symptoms.  Final Clinical Impressions(s) / UC Diagnoses   Final diagnoses:  Right hand pain  Cat bite, initial encounter     Discharge Instructions      Clean the area at least once a day with the Hibiclens   solution and apply the mupirocin  ointment and a nonstick dressing.  Take the full course of antibiotics.  Elevate at rest and ice off-and-on to help with swelling.  Over-the-counter pain relievers as needed.  You are up-to-date on your tetanus shot.  If you decide you want to go through with the rabies series, per the CDC you have 48 to 72 hours to start the series after exposure    ED Prescriptions     Medication Sig Dispense Auth. Provider   amoxicillin -clavulanate (AUGMENTIN ) 875-125 MG tablet Take 1 tablet by mouth every 12 (twelve) hours. 14 tablet Stuart Vernell Norris, PA-C   mupirocin  ointment (BACTROBAN ) 2 % Apply 1 Application topically 2 (two) times daily.  60 g Stuart Vernell Norris, PA-C   chlorhexidine  (HIBICLENS ) 4 % external liquid Apply topically daily as needed. 236 mL Stuart Vernell Norris, [place]      PDMP not reviewed this encounter.   Stuart Vernell Norris, [place] 06/17/24 2012

## 2024-06-17 NOTE — ED Triage Notes (Signed)
 Pt states she picked up a cat that she found in a parking lot and the cat bit her when saw a dog. Now having redness to her right index finger where the bite is.

## 2024-06-20 ENCOUNTER — Encounter: Payer: Self-pay | Admitting: Physical Therapy

## 2024-06-20 ENCOUNTER — Encounter: Attending: Physician Assistant | Admitting: Physical Therapy

## 2024-06-20 DIAGNOSIS — M5459 Other low back pain: Secondary | ICD-10-CM | POA: Insufficient documentation

## 2024-06-20 DIAGNOSIS — M6281 Muscle weakness (generalized): Secondary | ICD-10-CM | POA: Diagnosis present

## 2024-06-20 DIAGNOSIS — M25551 Pain in right hip: Secondary | ICD-10-CM | POA: Diagnosis present

## 2024-06-20 DIAGNOSIS — M25552 Pain in left hip: Secondary | ICD-10-CM | POA: Insufficient documentation
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
## 2024-06-25 ENCOUNTER — Encounter: Payer: Self-pay | Admitting: Physical Therapy

## 2024-07-02 ENCOUNTER — Encounter: Admitting: Physical Therapy

## 2024-07-04 ENCOUNTER — Encounter: Payer: Self-pay | Admitting: Physical Therapy

## 2024-07-04 ENCOUNTER — Encounter: Admitting: Physical Therapy

## 2024-07-04 DIAGNOSIS — M5459 Other low back pain: Secondary | ICD-10-CM

## 2024-07-04 DIAGNOSIS — M6281 Muscle weakness (generalized): Secondary | ICD-10-CM | POA: Diagnosis not present

## 2024-07-04 DIAGNOSIS — M25551 Pain in right hip: Secondary | ICD-10-CM

## 2024-07-04 DIAGNOSIS — M25552 Pain in left hip: Secondary | ICD-10-CM
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
## 2024-07-09 ENCOUNTER — Telehealth: Admitting: Adult Health

## 2024-07-09 ENCOUNTER — Encounter: Payer: Self-pay | Admitting: Adult Health

## 2024-07-09 DIAGNOSIS — F319 Bipolar disorder, unspecified: Secondary | ICD-10-CM

## 2024-07-09 DIAGNOSIS — F909 Attention-deficit hyperactivity disorder, unspecified type: Secondary | ICD-10-CM

## 2024-07-09 DIAGNOSIS — F411 Generalized anxiety disorder: Secondary | ICD-10-CM | POA: Diagnosis not present

## 2024-07-09 DIAGNOSIS — F331 Major depressive disorder, recurrent, moderate: Secondary | ICD-10-CM

## 2024-07-09 DIAGNOSIS — F422 Mixed obsessional thoughts and acts: Secondary | ICD-10-CM

## 2024-07-09 DIAGNOSIS — F329 Major depressive disorder, single episode, unspecified: Secondary | ICD-10-CM

## 2024-07-09 MED ORDER — SERTRALINE HCL 100 MG PO TABS: 200.0000 mg | ORAL_TABLET | Freq: Every day | ORAL | 0 refills | Status: DC

## 2024-07-09 MED ORDER — LAMOTRIGINE 150 MG PO TABS: ORAL_TABLET | ORAL | 1 refills | Status: DC

## 2024-07-09 NOTE — Progress Notes (Signed)
 Cristina Davenport [id number] [birth year] 38 y.o.  Virtual Visit via Video Note  I connected with pt @ on 07/09/24 at  9:00 AM EDT by a video enabled telemedicine application and verified that I am speaking with the correct person using two identifiers.   I discussed the limitations of evaluation and management by telemedicine and the availability of in person appointments. The patient expressed understanding and agreed to proceed.  I discussed the assessment and treatment plan with the patient. The patient was provided an opportunity to ask questions and all were answered. The patient agreed with the plan and demonstrated an understanding of the instructions.   The patient was advised to call back or seek an in-person evaluation if the symptoms worsen or if the condition fails to improve as anticipated.  I provided 25 minutes of non-face-to-face time during this encounter.  The patient was located at home.  The provider was located at [facility].   Angeline LOISE Sayers, NP   Subjective:   Patient ID:  Cristina Davenport is a 38 y.o. (DOB Aug 22, 1986) female.  Chief Complaint: No chief complaint on file.   HPI DARNISHA VERNET presents for follow-up of ADHD, MDD, GAD, and mixed obsessional thoughts and acts.   Describes mood today as better. Pleasant. Mood symptoms - reports  depression, anxiety and irritability. Reports lower interest and motivation. Reports feeling overwhelmed every day. Reports panic attacks - I can't take a deep breath. Reports worry, rumination and over thinking. Reports obsessive thoughts and acts - cleaning. Reports mood is variable - having ups and downs. Stating I don't feel like I'm doing as well.  Reports medications are helpful, but is willing to consider other options for mood instability. Taking medications as prescribed. Energy levels lower. Active, has a regular exercise routine - Yoga. Enjoys some usual interests and activities. Married. Lives with  husband. Has 3 children - dog. Family local. Spending time with family. Appetite adequate. Weight loss - 124 from 128 pounds. Sleeps better some nights than others. Averages 5 to 6 hours of broken sleep.   Reports focus and concentration difficulties. Reports the Adderall  is helpful. Completing tasks. Managing aspects of household. Works in Research officer, political party.  Denies SI or HI.  Denies AH or VH. Denies self harm. Reports disassociation. Reports paranoia. Denies substance use.  Previous medication trials: Zoloft , Hydroxyzine , Adderall  XR 20mg  daily, Lamictal   Review of Systems:  Review of Systems  Musculoskeletal:  Negative for gait problem.  Neurological:  Negative for tremors.  Psychiatric/Behavioral:         Please refer to HPI    Medications: I have reviewed the patient's current medications.  Current Outpatient Medications  Medication Sig Dispense Refill   ADDERALL  XR 30 MG 24 hr capsule Take 1 capsule (30 mg total) by mouth daily. 30 capsule 0   ADDERALL  XR 30 MG 24 hr capsule Take 1 capsule (30 mg total) by mouth daily. 30 capsule 0   ADDERALL  XR 30 MG 24 hr capsule Take 1 capsule (30 mg total) by mouth daily. 30 capsule 0   amoxicillin -clavulanate (AUGMENTIN ) 875-125 MG tablet Take 1 tablet by mouth every 12 (twelve) hours. 14 tablet 0   amphetamine -dextroamphetamine  (ADDERALL ) 10 MG tablet Take 1 tablet (10 mg total) by mouth daily. 30 tablet 0   amphetamine -dextroamphetamine  (ADDERALL ) 10 MG tablet Take 1 tablet (10 mg total) by mouth daily. 30 tablet 0   amphetamine -dextroamphetamine  (ADDERALL ) 10 MG tablet Take 1 tablet (10 mg total) by mouth daily.  30 tablet 0   ASHWAGANDHA PO Take 2 capsules by mouth in the morning.     B Complex-C (SUPER B COMPLEX PO) Take 1 capsule by mouth in the morning.     chlorhexidine  (HIBICLENS ) 4 % external liquid Apply topically daily as needed. 236 mL 0   clonazePAM  (KLONOPIN ) 0.5 MG tablet Take 1 tablet (0.5 mg total) by mouth 2 (two) times daily  as needed for anxiety. 60 tablet 2   famotidine  (PEPCID ) 40 MG tablet TAKE 1 TABLET BY MOUTH EVERYDAY AT BEDTIME 90 tablet 1   folic acid (FOLVITE) 800 MCG tablet Take 400 mcg by mouth daily in the afternoon.     Inositol Niacinate 500 MG TABS Take 500 mg by mouth in the morning.     ketorolac  (TORADOL ) 10 MG tablet Take 1 tablet (10 mg total) by mouth every 8 (eight) hours as needed. 15 tablet 0   L-LYSINE PO Take 1 capsule by mouth in the morning.     L-THEANINE PO Take 1 tablet by mouth in the morning.     lamoTRIgine  (LAMICTAL ) 100 MG tablet TAKE 1 TABLET BY MOUTH EVERYDAY AT BEDTIME 30 tablet 5   Magnesium  400 MG TABS Take 400 mg by mouth every evening.     Moringa Oleifera (MORINGA PO) Take 2 tablets by mouth every evening.     Multiple Vitamins-Minerals (HAIR SKIN NAILS PO) Take 1 tablet by mouth in the morning.     Multiple Vitamins-Minerals (ZINC PO) Take 1 tablet by mouth every evening.     mupirocin  ointment (BACTROBAN ) 2 % Apply 1 Application topically 2 (two) times daily. 60 g 0   ondansetron  (ZOFRAN -ODT) 8 MG disintegrating tablet Take 1 tablet (8 mg total) by mouth every 8 (eight) hours as needed for nausea or vomiting. 8 tablet 0   OVER THE COUNTER MEDICATION Take 1 capsule by mouth in the morning. [place]     oxyCODONE -acetaminophen  (PERCOCET) 7.5-325 MG tablet Take 1 tablet by mouth every 6 (six) hours as needed. 28 tablet 0   Probiotic Product (DIGESTIVE ADV PREBIOT+PROBIOT PO) Take 1 capsule by mouth in the morning.     Prucalopride Succinate  (MOTEGRITY ) 2 MG TABS Take 1 tablet (2 mg total) by mouth daily. 90 tablet 3   sertraline  (ZOLOFT ) 100 MG tablet TAKE 2 TABLETS BY MOUTH EVERY DAY 180 tablet 0   silver  sulfADIAZINE  (SILVADENE ) 1 % cream Apply to area 3 times daily (Patient taking differently: Apply 1 Application topically 3 (three) times daily as needed (irritation.).) 50 g 11   Simethicone  250 MG CAPS Take 250 mg by mouth in the morning.     tretinoin (RETIN-A)  0.05 % cream Apply 1 Application topically 3 (three) times a week.     trichloroacetic acid 80 % LIQD Dispense for in office use 15 mL 0   TURMERIC CURCUMIN PO Take 1 capsule by mouth every evening. W/Ginger Powder     No current facility-administered medications for this visit.    Medication Side Effects: None  Allergies:  Allergies  Allergen Reactions   Latex Other (See Comments)    General irritation   Doxycycline Rash    Solar rash    Past Medical History:  Diagnosis Date   Anxiety    Anxiety    Bipolar 1 disorder (HCC)    Depression    Family history of adverse reaction to anesthesia    GERD (gastroesophageal reflux disease)    IBS (irritable bowel syndrome)  Migraine without aura     Family History  Adopted: Yes  Problem Relation Age of Onset   Mental illness Mother    Cancer Father        skin cancer   Hypertension Maternal Grandmother    Stroke Maternal Grandmother    Kidney failure Maternal Grandmother    Hypertension Maternal Grandfather    Stroke Maternal Grandfather    Cancer Maternal Grandfather        skin   Crohn's disease Cousin    Colon cancer Neg Hx    Rectal cancer Neg Hx    Stomach cancer Neg Hx     Social History   Socioeconomic History   Marital status: Married    Spouse name: Isra Lindy   Number of children: 3   Years of education: 12   Highest education level: Some college, no degree  Occupational History   Occupation: Magazine features editor   Occupation: realtor  Tobacco Use   Smoking status: Former    Current packs/day: 0.00    Average packs/day: 0.5 packs/day for 8.0 years (4.0 ttl pk-yrs)    Types: Cigarettes    Start date: 09/19/2006    Quit date: 09/19/2014    Years since quitting: 9.8   Smokeless tobacco: Never  Vaping Use   Vaping status: Every Day   Substances: Nicotine, Flavoring  Substance and Sexual Activity   Alcohol use: Yes    Alcohol/week: 3.0 standard drinks of alcohol    Types: 3 Glasses of wine per  week    Comment: scoially   Drug use: Yes    Types: Marijuana    Comment: for pain in the back   Sexual activity: Yes    Partners: Male    Birth control/protection: Pill  Other Topics Concern   Not on file  Social History Narrative   Married since July 2020,been together for 10 years.Lives with husband and kids.   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   Social Drivers of Corporate investment banker Strain: Low Risk  (12/29/2021)   Overall Financial Resource Strain (CARDIA)    Difficulty of Paying Living Expenses: Not hard at all  Food Insecurity: No Food Insecurity (12/29/2021)   Hunger Vital Sign    Worried About Running Out of Food in the Last Year: Never true    Ran Out of Food in the Last Year: Never true  Transportation Needs: No Transportation Needs (12/29/2021)   PRAPARE - Administrator, Civil Service (Medical): No    Lack of Transportation (Non-Medical): No  Physical Activity: Sufficiently Active (12/29/2021)   Exercise Vital Sign    Days of Exercise per Week: 5 days    Minutes of Exercise per Session: 40 min  Stress: Stress Concern Present (12/29/2021)   [employer] of Occupational Health - Occupational Stress Questionnaire    Feeling of Stress : Very much  Social Connections: Unknown (12/29/2021)   Social Connection and Isolation Panel    Frequency of Communication with Friends and Family: More than three times a week    Frequency of Social Gatherings with Friends and Family: Once a week    Attends Religious Services: Patient declined    Database administrator or Organizations: No    Attends Engineer, structural: More than 4 times per year    Marital Status: Married  Catering manager Violence: Not At Risk (12/29/2021)   Humiliation, Afraid, Rape, and Kick questionnaire    Fear  of Current or Ex-Partner: No    Emotionally Abused: No    Physically Abused: No    Sexually Abused: No    Past Medical History, Surgical  history, Social history, and Family history were reviewed and updated as appropriate.   Please see review of systems for further details on the patient's review from today.   Objective:   Physical Exam:  LMP 09/06/2023   Physical Exam Constitutional:      General: She is not in acute distress. Musculoskeletal:        General: No deformity.  Neurological:     Mental Status: She is alert and oriented to person, place, and time.     Coordination: Coordination normal.  Psychiatric:        Attention and Perception: Attention and perception normal. She does not perceive auditory or visual hallucinations.        Mood and Affect: Mood normal. Mood is not anxious or depressed. Affect is not labile, blunt, angry or inappropriate.        Speech: Speech normal.        Behavior: Behavior normal.        Thought Content: Thought content normal. Thought content is not paranoid or delusional. Thought content does not include homicidal or suicidal ideation. Thought content does not include homicidal or suicidal plan.        Cognition and Memory: Cognition and memory normal.        Judgment: Judgment normal.     Comments: Insight intact     Lab Review:     Component Value Date/Time   NA 137 02/08/2024 1027   NA 137 12/29/2021 0959   K 4.1 02/08/2024 1027   CL 103 02/08/2024 1027   CO2 28 02/08/2024 1027   GLUCOSE 59 (L) 02/08/2024 1027   BUN 14 02/08/2024 1027   BUN 9 12/29/2021 0959   CREATININE 0.69 02/08/2024 1027   CALCIUM 9.0 02/08/2024 1027   PROT 7.0 02/08/2024 1027   PROT 7.2 12/29/2021 0959   ALBUMIN 4.5 02/08/2024 1027   ALBUMIN 4.9 (H) 12/29/2021 0959   AST 15 02/08/2024 1027   ALT 16 02/08/2024 1027   ALKPHOS 47 02/08/2024 1027   BILITOT 0.3 02/08/2024 1027   BILITOT 0.3 12/29/2021 0959   GFRNONAA >60 11/02/2023 1135   GFRAA >60 10/15/2019 0909       Component Value Date/Time   WBC 8.5 02/08/2024 1027   RBC 4.45 02/08/2024 1027   HGB 13.3 02/08/2024 1027   HGB  14.7 12/29/2021 0959   HGB 13.3 09/23/2015 1638   HCT 39.9 02/08/2024 1027   HCT 42.9 12/29/2021 0959   PLT 312.0 02/08/2024 1027   PLT 260 12/29/2021 0959   MCV 89.6 02/08/2024 1027   MCV 89 12/29/2021 0959   MCH 30.2 11/02/2023 1135   MCHC 33.3 02/08/2024 1027   RDW 13.6 02/08/2024 1027   RDW 12.4 12/29/2021 0959   LYMPHSABS 2.3 02/08/2024 1027   LYMPHSABS 2.0 05/10/2019 1445   MONOABS 0.7 02/08/2024 1027   EOSABS 0.3 02/08/2024 1027   EOSABS 0.1 05/10/2019 1445   BASOSABS 0.1 02/08/2024 1027   BASOSABS 0.0 05/10/2019 1445    No results found for: POCLITH, LITHIUM   No results found for: PHENYTOIN, PHENOBARB, VALPROATE, CBMZ   .res Assessment: Plan:    Plan:  PDMP reviewed  Increase Lamictal  100mg  to 150mg  daily for depressive symptoms  Adderall  10mg  daily  Adderall  XR 30mg  daily Zoloft  200mg  daily Clonazepam  0.5mg  daily to  BID prn - uses as needed   RTC 4 weeks   25 minutes spent dedicated to the care of this patient on the date of this encounter to include pre-visit review of records, ordering of medication, post visit documentation, and face-to-face time with the patient discussing ADHD, MDD, GAD and obsessional thoughts and acts. Discussed continuing current medication regimen.  Monitor BP between visits while taking stimulant medication.   Patient advised to contact office with any questions, adverse effects, or acute worsening in signs and symptoms.  Discussed potential benefits, risks, and side effects of stimulants with patient to include increased heart rate, palpitations, insomnia, increased anxiety, increased irritability, or decreased appetite. Instructed patient to contact office if experiencing any significant tolerability issues.  Discussed potential benefits, risk, and side effects of benzodiazepines to include potential risk of tolerance and dependence, as well as possible drowsiness. Advised patient not to drive if experiencing  drowsiness and to take lowest possible effective dose to minimize risk of dependence and tolerance.   Counseled patient regarding potential benefits, risks, and side effects of Lamictal  to include potential risk of Stevens-Johnson syndrome. Advised patient to stop taking Lamictal  and contact office immediately if rash develops and to seek urgent medical attention if rash is severe and/or spreading quickly.    There are no diagnoses linked to this encounter.   Please see After Visit Summary for patient specific instructions.  Future Appointments  Date Time Provider Department Center  07/09/2024  9:00 AM Arlen Legendre, Angeline Mattocks, NP [facility] None  07/19/2024  8:50 AM Jayne Vonn DEL, MD [facility]  07/25/2024  9:30 AM Elnor Channing FALCON, PT [facility]    No orders of the defined types were placed in this encounter.     -------------------------------

## 2024-07-19 ENCOUNTER — Ambulatory Visit (INDEPENDENT_AMBULATORY_CARE_PROVIDER_SITE_OTHER): Admitting: Obstetrics & Gynecology

## 2024-07-19 VITALS — BP 108/67 | HR 69 | Ht 61.0 in | Wt 130.0 lb

## 2024-07-19 DIAGNOSIS — L700 Acne vulgaris: Secondary | ICD-10-CM | POA: Diagnosis not present

## 2024-07-19 DIAGNOSIS — R6889 Other general symptoms and signs: Secondary | ICD-10-CM

## 2024-07-19 MED ORDER — DROSPIRENONE-ESTETROL 3-14.2 MG PO TABS: 1.0000 | ORAL_TABLET | Freq: Every day | ORAL | 2 refills | Status: AC

## 2024-07-19 NOTE — Progress Notes (Signed)
 .Follow up appointment  Acne, unwanted hair  Chief Complaint  Patient presents with   Follow-up    Discuss hormones    Blood pressure 108/67, pulse 69, height 5' 1 (1.549 m), weight 130 lb (59 kg), last menstrual period 09/06/2023.  Pt is having increasing issues with cystic acne break outs and unwanted hair She relates it to her hysterectomy but of course her ovaries were spared Prior to she had an IUD and at times COC and of course pregnancies So she really hasn't been on her ownhormones for some time    MEDS ordered this encounter: Meds ordered this encounter  Medications   Drospirenone -Estetrol  3-14.2 MG TABS    Sig: Take 1 tablet by mouth daily.    Dispense:  28 tablet    Refill:  2    Orders for this encounter: No orders of the defined types were placed in this encounter.   Impression + Management Plan   ICD-10-CM   1. Acne cystica  L70.0     2. Unwanted hair  R68.89      Drosperinone for management of her 2 bothersome symtpoms, will combine with estetrol  in the form of Nexstellis, 3 samples given  Follow Up: Return if symptoms worsen or fail to improve, for my chart message follwo up for refills.     All questions were answered.  Past Medical History:  Diagnosis Date   Anxiety    Anxiety    Bipolar 1 disorder (HCC)    Depression    Family history of adverse reaction to anesthesia    GERD (gastroesophageal reflux disease)    IBS (irritable bowel syndrome)    Migraine without aura     Past Surgical History:  Procedure Laterality Date   ABDOMINAL SURGERY     cyst removed    CHOLECYSTECTOMY N/A 07/10/2013   Procedure: LAPAROSCOPIC CHOLECYSTECTOMY;  Surgeon: Thresa JAYSON Pulling, MD;  Location: [facility];  Service: General;  Laterality: N/A;   ESOPHAGOGASTRODUODENOSCOPY N/A 04/30/2013   MFM:Jawnmfjo distal esophagus of uncertain clinical significance-status post biopsy. Tiny hiatal hernia. No explanation   EXAMINATION UNDER ANESTHESIA  04/21/2012   Gluteal  wound repair   INCISIONAL HERNIA REPAIR N/A 04/06/2021   Procedure: INCISIONAL HERNIORRHAPHY;  Surgeon: Mavis Anes, MD;  Location: [facility];  Service: General;  Laterality: N/A;   ROBOTIC ASSISTED TOTAL HYSTERECTOMY WITH BILATERAL SALPINGO OOPHERECTOMY Bilateral 11/03/2023   Procedure: XI ROBOTIC ASISTED TOTAL HYSTERECTMY WITH BILATERAL SALPINGECTOMY;  Surgeon: Jayne Vonn DEL, MD;  Location: [facility];  Service: Gynecology;  Laterality: Bilateral;   WISDOM TOOTH EXTRACTION      OB History     Gravida  4   Para  3   Term  2   Preterm  1   AB  1   Living  3      SAB  1   IAB  0   Ectopic  0   Multiple  0   Live Births  3           Allergies  Allergen Reactions   Latex Other (See Comments)    General irritation   Doxycycline Rash    Solar rash    Social History   Socioeconomic History   Marital status: Married    Spouse name: Kamill Fulbright   Number of children: 3   Years of education: 12   Highest education level: Some college, no degree  Occupational History   Occupation: Magazine features editor   Occupation: realtor  Tobacco Use  Smoking status: Former    Current packs/day: 0.00    Average packs/day: 0.5 packs/day for 8.0 years (4.0 ttl pk-yrs)    Types: Cigarettes    Start date: 09/19/2006    Quit date: 09/19/2014    Years since quitting: 9.8   Smokeless tobacco: Never  Vaping Use   Vaping status: Every Day   Substances: Nicotine, Flavoring  Substance and Sexual Activity   Alcohol use: Yes    Alcohol/week: 3.0 standard drinks of alcohol    Types: 3 Glasses of wine per week    Comment: scoially   Drug use: Yes    Types: Marijuana    Comment: for pain in the back   Sexual activity: Yes    Partners: Male    Birth control/protection: Pill  Other Topics Concern   Not on file  Social History Narrative   Married since July 2020,been together for 10 years.Lives with husband and kids.   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    Social Drivers of Corporate investment banker Strain: Low Risk  (12/29/2021)   Overall Financial Resource Strain (CARDIA)    Difficulty of Paying Living Expenses: Not hard at all  Food Insecurity: No Food Insecurity (12/29/2021)   Hunger Vital Sign    Worried About Running Out of Food in the Last Year: Never true    Ran Out of Food in the Last Year: Never true  Transportation Needs: No Transportation Needs (12/29/2021)   PRAPARE - Administrator, Civil Service (Medical): No    Lack of Transportation (Non-Medical): No  Physical Activity: Sufficiently Active (12/29/2021)   Exercise Vital Sign    Days of Exercise per Week: 5 days    Minutes of Exercise per Session: 40 min  Stress: Stress Concern Present (12/29/2021)   [employer] of Occupational Health - Occupational Stress Questionnaire    Feeling of Stress : Very much  Social Connections: Unknown (12/29/2021)   Social Connection and Isolation Panel    Frequency of Communication with Friends and Family: More than three times a week    Frequency of Social Gatherings with Friends and Family: Once a week    Attends Religious Services: Patient declined    Database administrator or Organizations: No    Attends Engineer, structural: More than 4 times per year    Marital Status: Married    Family History  Adopted: Yes  Problem Relation Age of Onset   Mental illness Mother    Cancer Father        skin cancer   Hypertension Maternal Grandmother    Stroke Maternal Grandmother    Kidney failure Maternal Grandmother    Hypertension Maternal Grandfather    Stroke Maternal Grandfather    Cancer Maternal Grandfather        skin   Crohn's disease Cousin    Colon cancer Neg Hx    Rectal cancer Neg Hx    Stomach cancer Neg Hx

## 2024-07-25 ENCOUNTER — Encounter: Payer: Self-pay | Admitting: Physical Therapy

## 2024-08-08 ENCOUNTER — Telehealth: Payer: Self-pay | Admitting: Adult Health

## 2024-08-08 NOTE — Telephone Encounter (Signed)
 Patient called in for refill on Adderall  30mg  and Dextroamphetamine  10mg . Ph: [phone] Pharmacy [facility] [address] [place] CHILD

## 2024-08-09 ENCOUNTER — Other Ambulatory Visit: Payer: Self-pay

## 2024-08-09 DIAGNOSIS — F411 Generalized anxiety disorder: Secondary | ICD-10-CM

## 2024-08-09 DIAGNOSIS — F909 Attention-deficit hyperactivity disorder, unspecified type: Secondary | ICD-10-CM

## 2024-08-09 MED ORDER — ADDERALL XR 30 MG PO CP24: 30.0000 mg | ORAL_CAPSULE | Freq: Every day | ORAL | 0 refills | Status: DC

## 2024-08-09 MED ORDER — AMPHETAMINE-DEXTROAMPHETAMINE 10 MG PO TABS: 10.0000 mg | ORAL_TABLET | Freq: Every day | ORAL | 0 refills | Status: DC

## 2024-08-09 NOTE — Telephone Encounter (Signed)
 Pended

## 2024-08-09 NOTE — Telephone Encounter (Signed)
 Apt 10/1

## 2024-08-09 NOTE — Telephone Encounter (Signed)
 Pt due for FU now, please call to schedule

## 2024-08-14 ENCOUNTER — Encounter: Payer: Self-pay | Admitting: Adult Health

## 2024-08-14 ENCOUNTER — Telehealth (INDEPENDENT_AMBULATORY_CARE_PROVIDER_SITE_OTHER): Admitting: Adult Health

## 2024-08-14 DIAGNOSIS — F411 Generalized anxiety disorder: Secondary | ICD-10-CM | POA: Diagnosis not present

## 2024-08-14 DIAGNOSIS — F909 Attention-deficit hyperactivity disorder, unspecified type: Secondary | ICD-10-CM | POA: Diagnosis not present

## 2024-08-14 DIAGNOSIS — F422 Mixed obsessional thoughts and acts: Secondary | ICD-10-CM

## 2024-08-14 DIAGNOSIS — F319 Bipolar disorder, unspecified: Secondary | ICD-10-CM | POA: Diagnosis not present

## 2024-08-14 DIAGNOSIS — F4312 Post-traumatic stress disorder, chronic: Secondary | ICD-10-CM

## 2024-08-14 MED ORDER — SERTRALINE HCL 100 MG PO TABS: 200.0000 mg | ORAL_TABLET | Freq: Every day | ORAL | 2 refills | Status: DC

## 2024-08-14 MED ORDER — LAMOTRIGINE 150 MG PO TABS: ORAL_TABLET | ORAL | 2 refills | Status: DC

## 2024-08-14 NOTE — Progress Notes (Signed)
 Cristina Davenport [id number] 31-Jul-1986 38 y.o.  Virtual Visit via Video Note  I connected with pt @ on 08/14/24 at  9:30 AM EDT by a video enabled telemedicine application and verified that I am speaking with the correct person using two identifiers.   I discussed the limitations of evaluation and management by telemedicine and the availability of in person appointments. The patient expressed understanding and agreed to proceed.  I discussed the assessment and treatment plan with the patient. The patient was provided an opportunity to ask questions and all were answered. The patient agreed with the plan and demonstrated an understanding of the instructions.   The patient was advised to call back or seek an in-person evaluation if the symptoms worsen or if the condition fails to improve as anticipated.  I provided 25 minutes of non-face-to-face time during this encounter.  The patient was located at home.  The provider was located at [facility].   Cristina Cristina Sayers, NP   Subjective:   Patient ID:  Cristina Davenport is a 38 y.o. (DOB 1986/02/08) female.  Chief Complaint: No chief complaint on file.   HPI Cristina Davenport presents for follow-up of ADHD, BPD1, GAD, and mixed obsessional thoughts and acts.   Describes mood today as ok. Pleasant. Mood symptoms - reports  depression - less over the past few days. Reports anxiety at times. Reports irritability - more so when I get overwhelmed. Reports improved interest and motivation. Denies recent panic attacks. Reports worry, rumination and over thinking. Reports obsessive thoughts and acts - cleaning. Reports mood is variable - having ups and downs. Stating I have started feeling better. Reports medications are helpful, but is still experiencing mood instability. Taking medications as prescribed. Energy levels up and down. Active, has a regular exercise routine - Yoga. Enjoys some usual interests and activities. Married.  Lives with husband. Has 3 children - dog. Family local. Spending time with family. Appetite adequate. Weight stable - 128 pounds. Sleeps better some nights than others. Averages 5 hours of broken sleep - children getting in the bed with her during the night.   Reports focus and concentration difficulties. Reports the Adderall  is helpful. Completing tasks. Managing aspects of household. Works in Research officer, political party.  Denies SI or HI.  Denies AH or VH. Denies self harm. Reports disassociation. Reports paranoia. Denies substance use.  Previous medication trials: Zoloft , Hydroxyzine , Adderall  XR 20mg  daily, Lamictal    Review of Systems:  Review of Systems  Musculoskeletal:  Negative for gait problem.  Neurological:  Negative for tremors.  Psychiatric/Behavioral:         Please refer to HPI    Medications: I have reviewed the patient's current medications.  Current Outpatient Medications  Medication Sig Dispense Refill   ADDERALL  XR 30 MG 24 hr capsule Take 1 capsule (30 mg total) by mouth daily. 30 capsule 0   ADDERALL  XR 30 MG 24 hr capsule Take 1 capsule (30 mg total) by mouth daily. 30 capsule 0   ADDERALL  XR 30 MG 24 hr capsule Take 1 capsule (30 mg total) by mouth daily. 30 capsule 0   amoxicillin -clavulanate (AUGMENTIN ) 875-125 MG tablet Take 1 tablet by mouth every 12 (twelve) hours. (Patient not taking: Reported on 07/19/2024) 14 tablet 0   amphetamine -dextroamphetamine  (ADDERALL ) 10 MG tablet Take 1 tablet (10 mg total) by mouth daily. 30 tablet 0   amphetamine -dextroamphetamine  (ADDERALL ) 10 MG tablet Take 1 tablet (10 mg total) by mouth daily. 30 tablet 0  amphetamine -dextroamphetamine  (ADDERALL ) 10 MG tablet Take 1 tablet (10 mg total) by mouth daily. 30 tablet 0   ASHWAGANDHA PO Take 2 capsules by mouth in the morning.     B Complex-C (SUPER B COMPLEX PO) Take 1 capsule by mouth in the morning. (Patient not taking: Reported on 07/19/2024)     chlorhexidine  (HIBICLENS ) 4 % external  liquid Apply topically daily as needed. (Patient not taking: Reported on 07/19/2024) 236 mL 0   clonazePAM  (KLONOPIN ) 0.5 MG tablet Take 1 tablet (0.5 mg total) by mouth 2 (two) times daily as needed for anxiety. 60 tablet 2   Drospirenone -Estetrol  3-14.2 MG TABS Take 1 tablet by mouth daily. 28 tablet 2   famotidine  (PEPCID ) 40 MG tablet TAKE 1 TABLET BY MOUTH EVERYDAY AT BEDTIME 90 tablet 1   folic acid (FOLVITE) 800 MCG tablet Take 400 mcg by mouth daily in the afternoon. (Patient not taking: Reported on 07/19/2024)     Inositol Niacinate 500 MG TABS Take 500 mg by mouth in the morning.     ketorolac  (TORADOL ) 10 MG tablet Take 1 tablet (10 mg total) by mouth every 8 (eight) hours as needed. (Patient not taking: Reported on 07/19/2024) 15 tablet 0   L-LYSINE PO Take 1 capsule by mouth in the morning. (Patient not taking: Reported on 07/19/2024)     L-THEANINE PO Take 1 tablet by mouth in the morning. (Patient not taking: Reported on 07/19/2024)     lamoTRIgine  (LAMICTAL ) 150 MG tablet TAKE 1 TABLET BY MOUTH EVERYDAY AT BEDTIME 30 tablet 2   Magnesium  400 MG TABS Take 400 mg by mouth every evening.     Moringa Oleifera (MORINGA PO) Take 2 tablets by mouth every evening.     Multiple Vitamins-Minerals (HAIR SKIN NAILS PO) Take 1 tablet by mouth in the morning. (Patient not taking: Reported on 07/19/2024)     Multiple Vitamins-Minerals (ZINC PO) Take 1 tablet by mouth every evening.     mupirocin  ointment (BACTROBAN ) 2 % Apply 1 Application topically 2 (two) times daily. (Patient not taking: Reported on 07/19/2024) 60 g 0   ondansetron  (ZOFRAN -ODT) 8 MG disintegrating tablet Take 1 tablet (8 mg total) by mouth every 8 (eight) hours as needed for nausea or vomiting. 8 tablet 0   OVER THE COUNTER MEDICATION Take 1 capsule by mouth in the morning. [place] (Patient not taking: Reported on 07/19/2024)     oxyCODONE -acetaminophen  (PERCOCET) 7.5-325 MG tablet Take 1 tablet by mouth every 6 (six) hours as needed.  (Patient not taking: Reported on 07/19/2024) 28 tablet 0   Probiotic Product (DIGESTIVE ADV PREBIOT+PROBIOT PO) Take 1 capsule by mouth in the morning.     Prucalopride Succinate  (MOTEGRITY ) 2 MG TABS Take 1 tablet (2 mg total) by mouth daily. 90 tablet 3   sertraline  (ZOLOFT ) 100 MG tablet Take 2 tablets (200 mg total) by mouth daily. 60 tablet 2   silver  sulfADIAZINE  (SILVADENE ) 1 % cream Apply to area 3 times daily (Patient taking differently: Apply 1 Application topically 3 (three) times daily as needed (irritation.).) 50 g 11   Simethicone  250 MG CAPS Take 250 mg by mouth in the morning.     tretinoin (RETIN-A) 0.05 % cream Apply 1 Application topically 3 (three) times a week. (Patient not taking: Reported on 07/19/2024)     trichloroacetic acid 80 % LIQD Dispense for in office use (Patient not taking: Reported on 07/19/2024) 15 mL 0   TURMERIC CURCUMIN PO Take 1 capsule by mouth  every evening. W/Ginger Powder (Patient not taking: Reported on 07/19/2024)     No current facility-administered medications for this visit.    Medication Side Effects: None  Allergies:  Allergies  Allergen Reactions   Latex Other (See Comments)    General irritation   Doxycycline Rash    Solar rash    Past Medical History:  Diagnosis Date   Anxiety    Anxiety    Bipolar 1 disorder (HCC)    Depression    Family history of adverse reaction to anesthesia    GERD (gastroesophageal reflux disease)    IBS (irritable bowel syndrome)    Migraine without aura     Family History  Adopted: Yes  Problem Relation Age of Onset   Mental illness Mother    Cancer Father        skin cancer   Hypertension Maternal Grandmother    Stroke Maternal Grandmother    Kidney failure Maternal Grandmother    Hypertension Maternal Grandfather    Stroke Maternal Grandfather    Cancer Maternal Grandfather        skin   Crohn's disease Cousin    Colon cancer Neg Hx    Rectal cancer Neg Hx    Stomach cancer Neg Hx      Social History   Socioeconomic History   Marital status: Married    Spouse name: Erisa Mehlman   Number of children: 3   Years of education: 12   Highest education level: Some college, no degree  Occupational History   Occupation: Magazine features editor   Occupation: realtor  Tobacco Use   Smoking status: Former    Current packs/day: 0.00    Average packs/day: 0.5 packs/day for 8.0 years (4.0 ttl pk-yrs)    Types: Cigarettes    Start date: 09/19/2006    Quit date: 09/19/2014    Years since quitting: 9.9   Smokeless tobacco: Never  Vaping Use   Vaping status: Every Day   Substances: Nicotine, Flavoring  Substance and Sexual Activity   Alcohol use: Yes    Alcohol/week: 3.0 standard drinks of alcohol    Types: 3 Glasses of wine per week    Comment: scoially   Drug use: Yes    Types: Marijuana    Comment: for pain in the back   Sexual activity: Yes    Partners: Male    Birth control/protection: Pill  Other Topics Concern   Not on file  Social History Narrative   Married since July 2020,been together for 10 years.Lives with husband and kids.   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   Social Drivers of Corporate investment banker Strain: Low Risk  (12/29/2021)   Overall Financial Resource Strain (CARDIA)    Difficulty of Paying Living Expenses: Not hard at all  Food Insecurity: No Food Insecurity (12/29/2021)   Hunger Vital Sign    Worried About Running Out of Food in the Last Year: Never true    Ran Out of Food in the Last Year: Never true  Transportation Needs: No Transportation Needs (12/29/2021)   PRAPARE - Administrator, Civil Service (Medical): No    Lack of Transportation (Non-Medical): No  Physical Activity: Sufficiently Active (12/29/2021)   Exercise Vital Sign    Days of Exercise per Week: 5 days    Minutes of Exercise per Session: 40 min  Stress: Stress Concern Present (12/29/2021)   [employer] of Occupational Health  - Occupational Stress  Questionnaire    Feeling of Stress : Very much  Social Connections: Unknown (12/29/2021)   Social Connection and Isolation Panel    Frequency of Communication with Friends and Family: More than three times a week    Frequency of Social Gatherings with Friends and Family: Once a week    Attends Religious Services: Patient declined    Database administrator or Organizations: No    Attends Engineer, structural: More than 4 times per year    Marital Status: Married  Catering manager Violence: Not At Risk (12/29/2021)   Humiliation, Afraid, Rape, and Kick questionnaire    Fear of Current or Ex-Partner: No    Emotionally Abused: No    Physically Abused: No    Sexually Abused: No    Past Medical History, Surgical history, Social history, and Family history were reviewed and updated as appropriate.   Please see review of systems for further details on the patient's review from today.   Objective:   Physical Exam:  LMP 09/06/2023   Physical Exam Constitutional:      General: She is not in acute distress. Musculoskeletal:        General: No deformity.  Neurological:     Mental Status: She is alert and oriented to person, place, and time.     Coordination: Coordination normal.  Psychiatric:        Attention and Perception: Attention and perception normal. She does not perceive auditory or visual hallucinations.        Mood and Affect: Mood normal. Mood is not anxious or depressed. Affect is not labile, blunt, angry or inappropriate.        Speech: Speech normal.        Behavior: Behavior normal.        Thought Content: Thought content normal. Thought content is not paranoid or delusional. Thought content does not include homicidal or suicidal ideation. Thought content does not include homicidal or suicidal plan.        Cognition and Memory: Cognition and memory normal.        Judgment: Judgment normal.     Comments: Insight intact     Lab Review:      Component Value Date/Time   NA 137 02/08/2024 1027   NA 137 12/29/2021 0959   K 4.1 02/08/2024 1027   CL 103 02/08/2024 1027   CO2 28 02/08/2024 1027   GLUCOSE 59 (L) 02/08/2024 1027   BUN 14 02/08/2024 1027   BUN 9 12/29/2021 0959   CREATININE 0.69 02/08/2024 1027   CALCIUM 9.0 02/08/2024 1027   PROT 7.0 02/08/2024 1027   PROT 7.2 12/29/2021 0959   ALBUMIN 4.5 02/08/2024 1027   ALBUMIN 4.9 (H) 12/29/2021 0959   AST 15 02/08/2024 1027   ALT 16 02/08/2024 1027   ALKPHOS 47 02/08/2024 1027   BILITOT 0.3 02/08/2024 1027   BILITOT 0.3 12/29/2021 0959   GFRNONAA >60 11/02/2023 1135   GFRAA >60 10/15/2019 0909       Component Value Date/Time   WBC 8.5 02/08/2024 1027   RBC 4.45 02/08/2024 1027   HGB 13.3 02/08/2024 1027   HGB 14.7 12/29/2021 0959   HGB 13.3 09/23/2015 1638   HCT 39.9 02/08/2024 1027   HCT 42.9 12/29/2021 0959   PLT 312.0 02/08/2024 1027   PLT 260 12/29/2021 0959   MCV 89.6 02/08/2024 1027   MCV 89 12/29/2021 0959   MCH 30.2 11/02/2023 1135   MCHC 33.3 02/08/2024 1027  RDW 13.6 02/08/2024 1027   RDW 12.4 12/29/2021 0959   LYMPHSABS 2.3 02/08/2024 1027   LYMPHSABS 2.0 05/10/2019 1445   MONOABS 0.7 02/08/2024 1027   EOSABS 0.3 02/08/2024 1027   EOSABS 0.1 05/10/2019 1445   BASOSABS 0.1 02/08/2024 1027   BASOSABS 0.0 05/10/2019 1445    No results found for: POCLITH, LITHIUM   No results found for: PHENYTOIN, PHENOBARB, VALPROATE, CBMZ   .res Assessment: Plan:    Plan:  PDMP reviewed  Zoloft  200mg  daily  Lamictal  150mg  daily for depressive symptoms  Adderall  10mg  daily  Adderall  XR 30mg  daily  Clonazepam  0.5mg  daily to BID prn - uses as needed   RTC 2 months  25 minutes spent dedicated to the care of this patient on the date of this encounter to include pre-visit review of records, ordering of medication, post visit documentation, and face-to-face time with the patient discussing ADHD, BPD-1, GAD, CPTSD and obsessional  thoughts and acts. Discussed continuing current medication regimen.  Monitor BP between visits while taking stimulant medication.   Patient advised to contact office with any questions, adverse effects, or acute worsening in signs and symptoms.  Discussed potential benefits, risks, and side effects of stimulants with patient to include increased heart rate, palpitations, insomnia, increased anxiety, increased irritability, or decreased appetite. Instructed patient to contact office if experiencing any significant tolerability issues.  Discussed potential benefits, risk, and side effects of benzodiazepines to include potential risk of tolerance and dependence, as well as possible drowsiness. Advised patient not to drive if experiencing drowsiness and to take lowest possible effective dose to minimize risk of dependence and tolerance.   Counseled patient regarding potential benefits, risks, and side effects of Lamictal  to include potential risk of Stevens-Johnson syndrome. Advised patient to stop taking Lamictal  and contact office immediately if rash develops and to seek urgent medical attention if rash is severe and/or spreading quickly.   Diagnoses and all orders for this visit:  Bipolar I disorder (HCC) -     lamoTRIgine  (LAMICTAL ) 150 MG tablet; TAKE 1 TABLET BY MOUTH EVERYDAY AT BEDTIME  Generalized anxiety disorder -     sertraline  (ZOLOFT ) 100 MG tablet; Take 2 tablets (200 mg total) by mouth daily.  Attention deficit hyperactivity disorder (ADHD), unspecified ADHD type  Mixed obsessional thoughts and acts  Chronic post-traumatic stress disorder (PTSD)     Please see After Visit Summary for patient specific instructions.  No future appointments.   No orders of the defined types were placed in this encounter.     -------------------------------

## 2024-08-19 ENCOUNTER — Emergency Department
Admission: EM | Admit: 2024-08-19 | Discharge: 2024-08-20 | Disposition: A | Discharge status: Other Acute Inpatient Hospital | Attending: Emergency Medicine | Admitting: Emergency Medicine

## 2024-08-19 DIAGNOSIS — T1491XA Suicide attempt, initial encounter: Secondary | ICD-10-CM | POA: Diagnosis present

## 2024-08-19 DIAGNOSIS — Y906 Blood alcohol level of 120-199 mg/100 ml: Secondary | ICD-10-CM | POA: Insufficient documentation

## 2024-08-19 DIAGNOSIS — T50902A Poisoning by unspecified drugs, medicaments and biological substances, intentional self-harm, initial encounter: Secondary | ICD-10-CM

## 2024-08-19 DIAGNOSIS — F1010 Alcohol abuse, uncomplicated: Secondary | ICD-10-CM | POA: Diagnosis not present

## 2024-08-19 DIAGNOSIS — Z8616 Personal history of COVID-19: Secondary | ICD-10-CM | POA: Diagnosis not present

## 2024-08-19 DIAGNOSIS — X838XXA Intentional self-harm by other specified means, initial encounter: Secondary | ICD-10-CM | POA: Insufficient documentation

## 2024-08-19 DIAGNOSIS — R456 Violent behavior: Secondary | ICD-10-CM | POA: Diagnosis not present

## 2024-08-19 DIAGNOSIS — R45851 Suicidal ideations: Secondary | ICD-10-CM | POA: Diagnosis not present

## 2024-08-19 DIAGNOSIS — T424X2A Poisoning by benzodiazepines, intentional self-harm, initial encounter: Secondary | ICD-10-CM | POA: Insufficient documentation

## 2024-08-19 DIAGNOSIS — F319 Bipolar disorder, unspecified: Secondary | ICD-10-CM | POA: Diagnosis not present

## 2024-08-19 DIAGNOSIS — Z91040 Latex allergy status: Secondary | ICD-10-CM | POA: Insufficient documentation

## 2024-08-19 DIAGNOSIS — T887XXA Unspecified adverse effect of drug or medicament, initial encounter: Secondary | ICD-10-CM | POA: Diagnosis not present

## 2024-08-19 DIAGNOSIS — R9431 Abnormal electrocardiogram [ECG] [EKG]: Secondary | ICD-10-CM | POA: Diagnosis not present

## 2024-08-19 DIAGNOSIS — T50912A Poisoning by multiple unspecified drugs, medicaments and biological substances, intentional self-harm, initial encounter: Secondary | ICD-10-CM

## 2024-08-19 DIAGNOSIS — F29 Unspecified psychosis not due to a substance or known physiological condition: Secondary | ICD-10-CM | POA: Diagnosis not present

## 2024-08-19 LAB — CBC
HCT: 38.4 % (ref 36.0–46.0)
Hemoglobin: 12.9 g/dL (ref 12.0–15.0)
MCH: 29.9 pg (ref 26.0–34.0)
MCHC: 33.6 g/dL (ref 30.0–36.0)
MCV: 89.1 fL (ref 80.0–100.0)
Platelets: 284 K/uL (ref 150–400)
RBC: 4.31 MIL/uL (ref 3.87–5.11)
RDW: 12.2 % (ref 11.5–15.5)
WBC: 7.1 K/uL (ref 4.0–10.5)
nRBC: 0 % (ref 0.0–0.2)

## 2024-08-19 LAB — COMPREHENSIVE METABOLIC PANEL WITH GFR
ALT: 16 U/L (ref 0–44)
AST: 20 U/L (ref 15–41)
Albumin: 4 g/dL (ref 3.5–5.0)
Alkaline Phosphatase: 35 U/L — ABNORMAL LOW (ref 38–126)
Anion gap: 16 — ABNORMAL HIGH (ref 5–15)
BUN: 13 mg/dL (ref 6–20)
CO2: 20 mmol/L — ABNORMAL LOW (ref 22–32)
Calcium: 8.5 mg/dL — ABNORMAL LOW (ref 8.9–10.3)
Chloride: 108 mmol/L (ref 98–111)
Creatinine, Ser: 0.75 mg/dL (ref 0.44–1.00)
GFR, Estimated: >60 mL/min (ref >60)
Glucose, Bld: 85 mg/dL (ref 70–99)
Potassium: 3.6 mmol/L (ref 3.5–5.1)
Sodium: 144 mmol/L (ref 135–145)
Total Bilirubin: 0.5 mg/dL (ref 0.0–1.2)
Total Protein: 6.6 g/dL (ref 6.5–8.1)

## 2024-08-19 LAB — ETHANOL: Alcohol, Ethyl (B): 132 mg/dL — ABNORMAL HIGH (ref <15)

## 2024-08-19 LAB — ACETAMINOPHEN LEVEL: Acetaminophen (Tylenol), Serum: <10 ug/mL — ABNORMAL LOW (ref 10–30)

## 2024-08-19 LAB — HCG, QUANTITATIVE, PREGNANCY: hCG, Beta Chain, Quant, S: <1 m[IU]/mL (ref <5)

## 2024-08-19 LAB — SALICYLATE LEVEL: Salicylate Lvl: <7 mg/dL — ABNORMAL LOW (ref 7.0–30.0)

## 2024-08-19 MED ORDER — LORAZEPAM 2 MG/ML IJ SOLN: 1.0000 mg | Freq: Once | INTRAMUSCULAR | Status: AC

## 2024-08-19 MED ORDER — DIPHENHYDRAMINE HCL 50 MG/ML IJ SOLN: 50.0000 mg | Freq: Once | INTRAMUSCULAR | Status: AC

## 2024-08-19 MED ORDER — HALOPERIDOL LACTATE 5 MG/ML IJ SOLN
INTRAMUSCULAR | Status: AC
  Administered 2024-08-19: 5 mg via INTRAMUSCULAR
  Filled 2024-08-19: qty 1

## 2024-08-19 MED ORDER — HALOPERIDOL LACTATE 5 MG/ML IJ SOLN: 5.0000 mg | Freq: Once | INTRAMUSCULAR | Status: AC

## 2024-08-19 MED ORDER — LORAZEPAM 2 MG/ML IJ SOLN
INTRAMUSCULAR | Status: AC
  Administered 2024-08-19: 1 mg via INTRAMUSCULAR
  Filled 2024-08-19: qty 1

## 2024-08-19 MED ORDER — DIPHENHYDRAMINE HCL 50 MG/ML IJ SOLN
INTRAMUSCULAR | Status: AC
  Administered 2024-08-19: 50 mg via INTRAMUSCULAR
  Filled 2024-08-19: qty 1

## 2024-08-19 NOTE — ED Notes (Signed)
Sitter with pt

## 2024-08-19 NOTE — ED Notes (Signed)
 IVC PENDING  CONSULT ?

## 2024-08-19 NOTE — ED Notes (Signed)
 Pt was not provided a dinner tray because pt was given a shot and has been sleeping.

## 2024-08-19 NOTE — BH Assessment (Signed)
 PATIENT BED AVAILABLE AFTER 8AM ON 08/20/24  Patient has been accepted to [facility].  Patient assigned to [facility] Accepting physician is Dr. Jess.  Call report to [phone].  Representative was Intake Kya.   ER Staff is aware of it:  [facility] ER Secretary  Dr. Clarine, ER MD  Amy Patient's Nurse

## 2024-08-19 NOTE — ED Notes (Signed)
 Pt in room with provider. Pt continues appears agitated and uncooperative. Pt gets out of bed and attempts to run out of room. Security stops pt from running out. RN assist pt with security back to room. Pt attempts to swing and kick at staff. Pt unwilling to follow instructions and continues to scream my kids are going to die, I am coming for all of you. Verbal descalation techniques are unsuccessful at this time. Provider made aware.

## 2024-08-19 NOTE — BH Assessment (Signed)
 Per [facility] [place]), patient to be referred out of system.  Referral information for Psychiatric Hospitalization faxed to;   [facility]  [phone]  [facility] Patient Placement  [phone]  [facility]  [phone]  [facility]  [phone]  [facility]  [phone]  [facility]  [phone]  [facility]  [phone]  [facility]  [phone]  [facility]  [phone]  [facility]  [phone]  [facility]  [phone]  [place] [facility]  [phone]  [facility]  [phone]  [facility]  [phone]
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
## 2024-08-19 NOTE — ED Triage Notes (Signed)
 Pt BIB EMS from home. Per EMS pt found by husband after drinking unknown amount of alcohol and taking an unknown amount of Clonopin. Pt agitated and uncooperative with EMS. Pt states I just did not want to feel anything anymore.  Pt uncooperative with questions at this time.   Past Medical History:  Diagnosis Date   Anxiety    Anxiety    Bipolar 1 disorder (HCC)    Depression    Family history of adverse reaction to anesthesia    GERD (gastroesophageal reflux disease)    IBS (irritable bowel syndrome)    Migraine without aura
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
## 2024-08-19 NOTE — BH Assessment (Signed)
 Comprehensive Clinical Assessment (CCA) Note  08/19/2024 Cristina Davenport [id number]  Chief Complaint:  Chief Complaint  Patient presents with   Drug Overdose   Suicidal   Visit Diagnosis: Bipolar    Cristina Davenport is a 38 year old female who presents to the ER for an overdose of medications and contacted her husband staying she no longer wanted to live. Per the husband, when he went home, he found the patient passed out on the back porch. That is when he discovered she had taking an unknown amount of her medications. He also shared, the patient has a history of bipolar and the last four months she hasn't been stable. The last month, her paranoia and fixation on spirituality has increased. She has been easily agitated and annoyed.   CCA Screening, Triage and Referral (STR)  Patient Reported Information How did you hear about us ? Family/Friend  What Is the Reason for Your Visit/Call Today? Patient took an overdose of medications and contacted her husband staying she no longer wanted to live.  How Long Has This Been Causing You Problems? 1-6 months  What Do You Feel Would Help You the Most Today? Treatment for Depression or other mood problem; Alcohol or Drug Use Treatment   Have You Recently Had Any Thoughts About Hurting Yourself? Yes  Are You Planning to Commit Suicide/Harm Yourself At This time? Yes   Flowsheet Row ED from 08/19/2024 in [facility] Emergency Department at [facility] UC from 06/17/2024 in [facility] Admission (Discharged) from 04/06/2021 in [facility]  C-SSRS RISK CATEGORY High Risk No Risk No Risk    Have you Recently Had Thoughts About Hurting Someone Sher[place]? No  Are You Planning to Harm Someone at This Time? No  Explanation: No data recorded  Have You Used Any Alcohol or Drugs in the Past 24 Hours? Yes  How Long Ago Did You Use Drugs or Alcohol? Within the last 24 hours  What Did You Use and How Much?  Alcohol, two glass fulls   Do You Currently Have a Therapist/Psychiatrist? Yes  Name of Therapist/Psychiatrist:    Have You Been Recently Discharged From Any Office Practice or Programs? No  Explanation of Discharge From Practice/Program: No data recorded    CCA Screening Triage Referral Assessment Type of Contact: Face-to-Face  Telemedicine Service Delivery:   Is this Initial or Reassessment?   Date Telepsych consult ordered in [facility]:    Time Telepsych consult ordered in [facility]:    Location of Assessment: [facility] ED  Provider Location: [facility] ED   Collateral Involvement: Husband   Does Patient Have a Automotive engineer Guardian? No  Legal Guardian Contact Information: No data recorded Copy of Legal Guardianship Form: No data recorded Legal Guardian Notified of Arrival: No data recorded Legal Guardian Notified of Pending Discharge: No data recorded If Minor and Not Living with Parent(s), Who has Custody? No data recorded Is CPS involved or ever been involved? Never  Is APS involved or ever been involved? Never   Patient Determined To Be At Risk for Harm To Self or Others Based on Review of Patient Reported Information or Presenting Complaint? Yes, for Self-Harm  Method: No data recorded Availability of Means: No data recorded Intent: No data recorded Notification Required: No data recorded Additional Information for Danger to Others Potential: No data recorded Additional Comments for Danger to Others Potential: No data recorded Are There Guns or Other Weapons in Your Home? Yes  Types of Guns/Weapons:  No data recorded Are These Weapons Safely Secured?                            Yes  Who Could Verify You Are Able To Have These Secured: No data recorded Do You Have any Outstanding Charges, Pending Court Dates, Parole/Probation? No data recorded Contacted To Inform of Risk of Harm To Self or Others: No data recorded   Does Patient Present under Involuntary Commitment?  No    [place] of Residence: [place]   Patient Currently Receiving the Following Services: Individual Therapy; Medication Management   Determination of Need: Emergent (2 hours)   Options For Referral: Inpatient Hospitalization; ED Visit     CCA Biopsychosocial Patient Reported Schizophrenia/Schizoaffective Diagnosis in Past: No   Strengths: Have stable housing, support system and outpatient providers.   Mental Health Symptoms Depression:  Change in energy/activity; Difficulty Concentrating   Duration of Depressive symptoms: Duration of Depressive Symptoms: Greater than two weeks   Mania:  Racing thoughts; Recklessness; Overconfidence; Irritability; Change in energy/activity   Anxiety:   Difficulty concentrating; Irritability; Restlessness   Psychosis:  Delusions; Other negative symptoms   Duration of Psychotic symptoms: Duration of Psychotic Symptoms: Less than six months   Trauma:  Avoids reminders of event; Difficulty staying/falling asleep; Emotional numbing   Obsessions:  N/A   Compulsions:  N/A   Inattention:  N/A   Hyperactivity/Impulsivity:  N/A   Oppositional/Defiant Behaviors:  N/A   Emotional Irregularity:  N/A   Other Mood/Personality Symptoms:  No data recorded   Mental Status Exam Appearance and self-care  Stature:  Average   Weight:  Average weight   Clothing:  Neat/clean; Age-appropriate   Grooming:  Normal   Cosmetic use:  None   Posture/gait:  Other (Comment)   Motor activity:  -- (Within normal range)   Sensorium  Attention:  Confused   Concentration:  Preoccupied; Scattered   Orientation:  X5   Recall/memory:  Normal   Affect and Mood  Affect:  Inappropriate   Mood:  Anxious   Relating  Eye contact:  Normal   Facial expression:  Depressed; Responsive   Attitude toward examiner:  Cooperative   Thought and Language  Speech flow: Clear and Coherent   Thought content:  Delusions   Preoccupation:  None    Hallucinations:  Auditory; Visual   Organization:  Environmental manager of Knowledge:  Fair   Intelligence:  Average   Abstraction:  Armed forces technical officer:  Impaired   Reality Testing:  Distorted   Insight:  Fair; Poor   Decision Making:  Impulsive   Social Functioning  Social Maturity:  Impulsive; Isolates   Social Judgement:  Heedless; Chief of Staff   Stress  Stressors:  Transitions   Coping Ability:  Human resources officer Deficits:  None   Supports:  Family     Religion: Religion/Spirituality Are You A Religious Person?: Yes  Leisure/Recreation: Leisure / Recreation Do You Have Hobbies?: No  Exercise/Diet: Exercise/Diet Do You Exercise?: No Have You Gained or Lost A Significant Amount of Weight in the Past Six Months?: No Do You Follow a Special Diet?: No Do You Have Any Trouble Sleeping?: Yes Explanation of Sleeping Difficulties: two to four hours of sleep   CCA Employment/Education Employment/Work Situation: Employment / Work Environmental consultant Job has Been Impacted by Current Illness: No Has Patient ever Been in Equities trader?: No  Education: Education Is Patient Currently  Attending School?: No Did You Attend College?: No Did You Have An Individualized Education Program (IIEP): No Did You Have Any Difficulty At School?: No Patient's Education Has Been Impacted by Current Illness: No   CCA Family/Childhood History Family and Relationship History: Family history Marital status: Married Does patient have children?: Yes  Childhood History:  Childhood History Did patient suffer any verbal/emotional/physical/sexual abuse as a child?: No Did patient suffer from severe childhood neglect?: No Has patient ever been sexually abused/assaulted/raped as an adolescent or adult?: No Was the patient ever a victim of a crime or a disaster?: No Witnessed domestic violence?: No Has patient been affected by domestic violence as an  adult?: No       CCA Substance Use Alcohol/Drug Use: Alcohol / Drug Use Pain Medications: See MAR Prescriptions: See MAR Over the Counter: See MAR History of alcohol / drug use?: Yes Longest period of sobriety (when/how long): Unable to quantify Substance #1 Name of Substance 1: Alcohol    ASAM's:  Six Dimensions of Multidimensional Assessment  Dimension 1:  Acute Intoxication and/or Withdrawal Potential:      Dimension 2:  Biomedical Conditions and Complications:      Dimension 3:  Emotional, Behavioral, or Cognitive Conditions and Complications:     Dimension 4:  Readiness to Change:     Dimension 5:  Relapse, Continued use, or Continued Problem Potential:     Dimension 6:  Recovery/Living Environment:     ASAM Severity Score:    ASAM Recommended Level of Treatment:     Substance use Disorder (SUD)    Recommendations for Services/Supports/Treatments:    Disposition Recommendation per psychiatric provider: Inpatient Treatment   DSM5 Diagnoses: Patient Active Problem List   Diagnosis Date Noted   Suicide attempt (HCC) 08/19/2024   Menorrhagia with regular cycle 11/03/2023   Dysmenorrhea 11/03/2023   Dyspareunia, female 11/03/2023   Shortness of breath 01/04/2023   COVID-19 01/04/2023   Pain in joint, multiple sites 11/24/2022   Other fatigue 11/24/2022   Restless leg 11/24/2022   Incisional hernia, without obstruction or gangrene    Abdominal wall mass 04/01/2021   Attention deficit hyperactivity disorder (ADHD), combined type 03/20/2020   Encounter for IUD insertion 02/24/2020   Genital warts 06/21/2019   HSV-1 infection 05/10/2019   Post partum depression 01/05/2018   Hx of preeclampsia, prior pregnancy, currently pregnant 02/16/2017   Abdominal bloating 03/14/2016   Migraine headache 03/12/2016   Anxiety 04/17/2015   Low grade squamous intraepithelial lesion on cytologic smear of cervix (LGSIL) 09/15/2014   Adjustment disorder with anxious mood  09/14/2014    Referrals to Alternative Service(s): Referred to Alternative Service(s):   Place:   Date:   Time:    Referred to Alternative Service(s):   Place:   Date:   Time:    Referred to Alternative Service(s):   Place:   Date:   Time:    Referred to Alternative Service(s):   Place:   Date:   Time:     Kiki DOROTHA Barge MS, LCAS, [facility], [facility] Therapeutic Triage Specialist 08/19/2024 4:47 PM

## 2024-08-19 NOTE — ED Notes (Signed)
 IVC  CONSULT  DONE  PENDING  PLACEMENT
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
## 2024-08-20 DIAGNOSIS — T1491XA Suicide attempt, initial encounter: Secondary | ICD-10-CM | POA: Diagnosis not present

## 2024-08-20 LAB — URINE DRUG SCREEN, QUALITATIVE (ARMC ONLY)
Amphetamines, Ur Screen: NOT DETECTED
Barbiturates, Ur Screen: NOT DETECTED
Benzodiazepine, Ur Scrn: POSITIVE — AB
Cannabinoid 50 Ng, Ur ~~LOC~~: POSITIVE — AB
Cocaine Metabolite,Ur ~~LOC~~: NOT DETECTED
MDMA (Ecstasy)Ur Screen: NOT DETECTED
Methadone Scn, Ur: NOT DETECTED
Opiate, Ur Screen: NOT DETECTED
Phencyclidine (PCP) Ur S: NOT DETECTED
Tricyclic, Ur Screen: POSITIVE — AB

## 2024-08-20 LAB — LAMOTRIGINE LEVEL: Lamotrigine Lvl: 3.5 ug/mL (ref 2.0–20.0)

## 2024-08-20 NOTE — ED Notes (Signed)
 IVC/PATIENT BED AVAILABLE AFTER 8AM ON 08/20/24 Patient has been accepted to [facility]

## 2024-08-20 NOTE — ED Notes (Addendum)
 EMTALA Reviewed by this RN.

## 2024-08-20 NOTE — ED Notes (Signed)
 Attempted to call report to [facility]. Message left for nurse. Awaiting call back from RN once available.

## 2024-08-20 NOTE — ED Provider Notes (Signed)
 Emergency Medicine Observation Re-evaluation Note  Cristina Davenport is a 38 y.o. female, seen on rounds today.  Pt initially presented to the ED for complaints of Drug Overdose and Suicidal Currently, the patient is resting comfortably, no issues overnight.  Physical Exam  BP 105/62   Pulse 80   Temp 98 F (36.7 C) (Oral)   Resp (!) 24   Ht 5' 1 (1.549 m)   Wt 54.4 kg   LMP 09/06/2023   SpO2 100%   BMI 22.67 kg/m  Physical Exam Patient appears well, no acute distress, normal WOB  ED Course / MDM  EKG:EKG Interpretation Date/Time:  Monday August 19 2024 13:47:26 EDT Ventricular Rate:  62 PR Interval:  140 QRS Duration:  80 QT Interval:  424 QTC Calculation: 430 R Axis:   80  Text Interpretation: Normal sinus rhythm Normal ECG When compared with ECG of 28-Mar-2021 11:14, No significant change was found Confirmed by UNCONFIRMED, DOCTOR ([id number]), editor Alex Slough [id number]) on 08/19/2024 2:06:36 PM  I have reviewed the labs performed to date as well as medications administered while in observation.  Recent changes in the last 24 hours include patient evaluated by psychiatry, who recommends admission.  Plan  Current plan is for psychiatric admission pending bed placement.    Willo Dunnings, MD 08/20/24 [phone]

## 2024-08-20 NOTE — ED Notes (Addendum)
 Report given to [facility]; Samule Salt RN. preparing pt for transport

## 2024-08-20 NOTE — ED Notes (Signed)
 Pt given breakfast tray

## 2024-08-28 ENCOUNTER — Ambulatory Visit: Admitting: Adult Health

## 2024-09-09 ENCOUNTER — Telehealth: Admitting: Adult Health

## 2024-10-04 ENCOUNTER — Encounter: Payer: Self-pay | Admitting: Adult Health

## 2024-10-04 ENCOUNTER — Ambulatory Visit (INDEPENDENT_AMBULATORY_CARE_PROVIDER_SITE_OTHER): Admitting: Adult Health

## 2024-10-04 DIAGNOSIS — F4312 Post-traumatic stress disorder, chronic: Secondary | ICD-10-CM

## 2024-10-04 DIAGNOSIS — F319 Bipolar disorder, unspecified: Secondary | ICD-10-CM

## 2024-10-04 DIAGNOSIS — F909 Attention-deficit hyperactivity disorder, unspecified type: Secondary | ICD-10-CM

## 2024-10-04 DIAGNOSIS — F411 Generalized anxiety disorder: Secondary | ICD-10-CM

## 2024-10-04 DIAGNOSIS — F422 Mixed obsessional thoughts and acts: Secondary | ICD-10-CM

## 2024-10-04 NOTE — Progress Notes (Signed)
 Cristina Davenport [id number] 1986-07-03 38 y.o.  Subjective:   Patient ID:  Cristina Davenport is a 38 y.o. (DOB 05/03/1986) female.  Chief Complaint: No chief complaint on file.   HPI Cristina Davenport presents to the office today for follow-up of ADHD, BPD1, GAD, and mixed obsessional thoughts and acts.   Describes mood today as ok. Pleasant. Mood symptoms - reports  depression. Denies anxiety. Reports irritability at times. Reports decreased interest and motivation. Reports recpanic attacks. Reports some worry and rumination. Denies over thinking. Reports obsessive thoughts and acts. Reports mood is variable. Stating I feel like I'm doing ok. Reports taking current medications as prescribed.   Energy levels up and down. Active, has a regular exercise routine - Yoga. Enjoys some usual interests and activities. Married. Lives with husband. Has 3 children - dog. Family local. Spending time with family. Appetite adequate. Weight stable - 131 pounds. Reports difficulties getting to sleep. Averages 3 to 4 hours Reports focus and concentration difficulties. Completing tasks. Managing aspects of household. Works in research officer, political party.  Denies SI or HI.  Denies AH or VH. Denies self harm. Reports disassociation. Reports paranoia. Denies substance use.  Previous medication trials: Zoloft , Hydroxyzine , Adderall  XR 20mg  daily, Lamictal   AUDIT    Flowsheet Row Office Visit from 12/29/2021 in [facility] for [facility] Healthcare at [facility]  Alcohol Use Disorder Identification Test Final Score (AUDIT) 4   GAD-7    Flowsheet Row Office Visit from 01/04/2023 in [facility] Visit from 12/29/2021 in [facility] for [facility] Healthcare at [facility] Office Visit from 10/28/2020 in [facility] Telemedicine from 06/19/2020 in [facility] from 03/06/2020 in [facility]  Total GAD-7 Score 7 19 19 17 21     PHQ2-9    Flowsheet Row Office Visit from 01/04/2023 in [facility] Visit from 12/29/2021 in [facility] for [facility] Healthcare at [facility] Office Visit from 01/19/2021 in [facility] Office Visit from 10/28/2020 in [facility]medicine from 06/19/2020 in [facility]  PHQ-2 Total Score 6 4 6 1 1   PHQ-9 Total Score 24 19 18 7 8    Flowsheet Row ED from 08/19/2024 in [facility] at [facility] from 06/17/2024 in [facility] at [facility] Admission (Discharged) from 04/06/2021 in [facility]  C-SSRS RISK CATEGORY High Risk No Risk No Risk     Review of Systems:  Review of Systems  Musculoskeletal:  Negative for gait problem.  Neurological:  Negative for tremors.  Psychiatric/Behavioral:         Please refer to HPI    Medications: I have reviewed the patient's current medications.  Current Outpatient Medications  Medication Sig Dispense Refill   ADDERALL  XR 30 MG 24 hr capsule Take 1 capsule (30 mg total) by mouth daily. 30 capsule 0   ADDERALL  XR 30 MG 24 hr capsule Take 1 capsule (30 mg total) by mouth daily. 30 capsule 0   ADDERALL  XR 30 MG 24 hr capsule Take 1 capsule (30 mg total) by mouth daily. 30 capsule 0   amphetamine -dextroamphetamine  (ADDERALL ) 10 MG tablet Take 1 tablet (10 mg total) by mouth daily. 30 tablet 0   amphetamine -dextroamphetamine  (ADDERALL ) 10 MG tablet Take 1 tablet (10 mg total) by mouth daily. 30 tablet 0   amphetamine -dextroamphetamine  (ADDERALL ) 10 MG tablet Take 1 tablet (10 mg total)  by mouth daily. 30 tablet 0   ASHWAGANDHA PO Take 2 capsules by mouth in the morning.     clonazePAM  (KLONOPIN ) 0.5 MG tablet Take 1 tablet (0.5 mg total) by mouth 2 (two) times daily as needed for anxiety. 60 tablet 2   Drospirenone -Estetrol  3-14.2 MG TABS Take 1 tablet by mouth daily. 28 tablet 2   famotidine  (PEPCID ) 40 MG  tablet TAKE 1 TABLET BY MOUTH EVERYDAY AT BEDTIME 90 tablet 1   Inositol Niacinate 500 MG TABS Take 500 mg by mouth in the morning.     lamoTRIgine  (LAMICTAL ) 150 MG tablet TAKE 1 TABLET BY MOUTH EVERYDAY AT BEDTIME 30 tablet 2   Magnesium  400 MG TABS Take 400 mg by mouth every evening.     Moringa Oleifera (MORINGA PO) Take 2 tablets by mouth every evening.     Multiple Vitamins-Minerals (ZINC PO) Take 1 tablet by mouth every evening.     Probiotic Product (DIGESTIVE ADV PREBIOT+PROBIOT PO) Take 1 capsule by mouth in the morning.     Prucalopride Succinate  (MOTEGRITY ) 2 MG TABS Take 1 tablet (2 mg total) by mouth daily. 90 tablet 3   sertraline  (ZOLOFT ) 100 MG tablet Take 2 tablets (200 mg total) by mouth daily. 60 tablet 2   silver  sulfADIAZINE  (SILVADENE ) 1 % cream Apply to area 3 times daily (Patient taking differently: Apply 1 Application topically 3 (three) times daily as needed (irritation.).) 50 g 11   Simethicone  250 MG CAPS Take 250 mg by mouth in the morning.     No current facility-administered medications for this visit.    Medication Side Effects: None  Allergies:  Allergies  Allergen Reactions   Latex Other (See Comments)    General irritation   Doxycycline Rash    Solar rash    Past Medical History:  Diagnosis Date   Anxiety    Anxiety    Bipolar 1 disorder (HCC)    Depression    Family history of adverse reaction to anesthesia    GERD (gastroesophageal reflux disease)    IBS (irritable bowel syndrome)    Migraine without aura     Past Medical History, Surgical history, Social history, and Family history were reviewed and updated as appropriate.   Please see review of systems for further details on the patient's review from today.   Objective:   Physical Exam:  LMP 09/06/2023   Physical Exam Constitutional:      General: She is not in acute distress. Musculoskeletal:        General: No deformity.  Neurological:     Mental Status: She is alert and  oriented to person, place, and time.     Coordination: Coordination normal.  Psychiatric:        Attention and Perception: Attention and perception normal. She does not perceive auditory or visual hallucinations.        Mood and Affect: Mood normal. Mood is not anxious or depressed. Affect is not labile, blunt, angry or inappropriate.        Speech: Speech normal.        Behavior: Behavior normal.        Thought Content: Thought content normal. Thought content is not paranoid or delusional. Thought content does not include homicidal or suicidal ideation. Thought content does not include homicidal or suicidal plan.        Cognition and Memory: Cognition and memory normal.        Judgment: Judgment normal.     Comments:  Insight intact     Lab Review:     Component Value Date/Time   NA 144 08/19/2024 1326   NA 137 12/29/2021 0959   K 3.6 08/19/2024 1326   CL 108 08/19/2024 1326   CO2 20 (L) 08/19/2024 1326   GLUCOSE 85 08/19/2024 1326   BUN 13 08/19/2024 1326   BUN 9 12/29/2021 0959   CREATININE 0.75 08/19/2024 1326   CALCIUM 8.5 (L) 08/19/2024 1326   PROT 6.6 08/19/2024 1326   PROT 7.2 12/29/2021 0959   ALBUMIN 4.0 08/19/2024 1326   ALBUMIN 4.9 (H) 12/29/2021 0959   AST 20 08/19/2024 1326   ALT 16 08/19/2024 1326   ALKPHOS 35 (L) 08/19/2024 1326   BILITOT 0.5 08/19/2024 1326   BILITOT 0.3 12/29/2021 0959   GFRNONAA >60 08/19/2024 1326   GFRAA >60 10/15/2019 0909       Component Value Date/Time   WBC 7.1 08/19/2024 1326   RBC 4.31 08/19/2024 1326   HGB 12.9 08/19/2024 1326   HGB 14.7 12/29/2021 0959   HGB 13.3 09/23/2015 1638   HCT 38.4 08/19/2024 1326   HCT 42.9 12/29/2021 0959   PLT 284 08/19/2024 1326   PLT 260 12/29/2021 0959   MCV 89.1 08/19/2024 1326   MCV 89 12/29/2021 0959   MCH 29.9 08/19/2024 1326   MCHC 33.6 08/19/2024 1326   RDW 12.2 08/19/2024 1326   RDW 12.4 12/29/2021 0959   LYMPHSABS 2.3 02/08/2024 1027   LYMPHSABS 2.0 05/10/2019 1445   MONOABS  0.7 02/08/2024 1027   EOSABS 0.3 02/08/2024 1027   EOSABS 0.1 05/10/2019 1445   BASOSABS 0.1 02/08/2024 1027   BASOSABS 0.0 05/10/2019 1445    No results found for: POCLITH, LITHIUM   No results found for: PHENYTOIN, PHENOBARB, VALPROATE, CBMZ   .res Assessment: Plan:    Plan:  PDMP reviewed  Will not prescribe medications today - will request recent hospitalization records before prescribing medications.  Hold: Zoloft  200mg  daily Lamictal  150mg  daily for depressive symptoms Adderall  10mg  daily  Adderall  XR 30mg  daily Clonazepam  0.5mg  daily to BID prn   RTC 2 months  25 minutes spent dedicated to the care of this patient on the date of this encounter to include pre-visit review of records, ordering of medication, post visit documentation, and face-to-face time with the patient discussing ADHD, BPD-1, GAD, CPTSD and obsessional thoughts and acts. Discussed continuing current medication regimen.  Patient advised to contact office with any questions, adverse effects, or acute worsening in signs and symptoms.  Discussed potential benefits, risks, and side effects of stimulants with patient to include increased heart rate, palpitations, insomnia, increased anxiety, increased irritability, or decreased appetite. Instructed patient to contact office if experiencing any significant tolerability issues.  Discussed potential benefits, risk, and side effects of benzodiazepines to include potential risk of tolerance and dependence, as well as possible drowsiness. Advised patient not to drive if experiencing drowsiness and to take lowest possible effective dose to minimize risk of dependence and tolerance.   Counseled patient regarding potential benefits, risks, and side effects of Lamictal  to include potential risk of Stevens-Johnson syndrome. Advised patient to stop taking Lamictal  and contact office immediately if rash develops and to seek urgent medical attention if rash is  severe and/or spreading quickly.    There are no diagnoses linked to this encounter.   Please see After Visit Summary for patient specific instructions.  Future Appointments  Date Time Provider Department Center  10/14/2024  9:30 AM Shabreka Coulon Nattalie, NP  CP-CP None    No orders of the defined types were placed in this encounter.   -------------------------------

## 2024-10-14 ENCOUNTER — Telehealth: Admitting: Adult Health

## 2024-10-29 ENCOUNTER — Encounter: Payer: Self-pay | Admitting: Adult Health

## 2024-10-29 ENCOUNTER — Ambulatory Visit: Admitting: Adult Health

## 2024-10-29 ENCOUNTER — Other Ambulatory Visit: Payer: Self-pay | Admitting: Adult Health

## 2024-10-29 DIAGNOSIS — F4312 Post-traumatic stress disorder, chronic: Secondary | ICD-10-CM

## 2024-10-29 DIAGNOSIS — F909 Attention-deficit hyperactivity disorder, unspecified type: Secondary | ICD-10-CM

## 2024-10-29 DIAGNOSIS — F411 Generalized anxiety disorder: Secondary | ICD-10-CM

## 2024-10-29 DIAGNOSIS — F422 Mixed obsessional thoughts and acts: Secondary | ICD-10-CM

## 2024-10-29 DIAGNOSIS — F319 Bipolar disorder, unspecified: Secondary | ICD-10-CM

## 2024-10-29 MED ORDER — GABAPENTIN 100 MG PO CAPS: 100.0000 mg | ORAL_CAPSULE | Freq: Three times a day (TID) | ORAL | 2 refills | Status: AC

## 2024-10-29 MED ORDER — BUPROPION HCL ER (XL) 150 MG PO TB24: 150.0000 mg | ORAL_TABLET | Freq: Every day | ORAL | 2 refills | Status: DC

## 2024-10-29 MED ORDER — LAMOTRIGINE 150 MG PO TABS: ORAL_TABLET | ORAL | 2 refills | Status: AC

## 2024-10-29 MED ORDER — SERTRALINE HCL 100 MG PO TABS: 200.0000 mg | ORAL_TABLET | Freq: Every day | ORAL | 2 refills | Status: AC

## 2024-10-29 NOTE — Progress Notes (Signed)
 Cristina Davenport [id number] 1985/11/30 38 y.o.  Subjective:   Patient ID:  Cristina Davenport is a 38 y.o. (DOB 08/04/1986) female.  Chief Complaint: No chief complaint on file.   HPI Cristina Davenport presents to the office today for follow-up of ADHD, BPD1, GAD, and mixed obsessional thoughts and acts.   Describes mood today as ok. Pleasant. Mood symptoms - reports  depression - feels tired all the time. Denies anxiety and irritability. Reports decreased interest and motivation. Denies panic attacks. Reports some worry and rumination and over thinking. Reports obsessive thoughts and acts. Reports mood is blah. Stating I feel like I'm doing ok, but I don't have motivation right now. Reports taking current medications as prescribed.   Energy levels lower. Active, has a regular exercise routine - Yoga. Enjoys some usual interests and activities. Married. Lives with husband. Has 3 children - dog. Family local. Spending time with family. Appetite adequate. Weight stable - 139 pounds. Reports difficulties getting to sleep. Averages 8 or more hours Reports focus and concentration difficulties. Completing tasks. Managing aspects of household. Works in research officer, political party.  Denies SI or HI.  Denies AH or VH. Denies self harm. Reports disassociation. Reports paranoia. Denies substance use.  Previous medication trials: Zoloft , Hydroxyzine , Adderall  XR 20mg  daily, Lamictal    AUDIT    Flowsheet Row [facility] 12/29/2021 in [facility]  Alcohol Use Disorder Identification Test Final Score (AUDIT) 4   GAD-7    Flowsheet Row Office Visit from 01/04/2023 in [facility] Visit from 12/29/2021 in [facility] for [facility] Healthcare at [facility] Office Visit from 10/28/2020 in [facility] Tele[facility]icine from 06/19/2020 in [facility] from 03/06/2020 in [facility]  Total GAD-7 Score 7 19 19 17 21    PHQ2-9    Flowsheet Row Office Visit from 01/04/2023 in [facility] Visit from 12/29/2021 in [facility] for [facility] Healthcare at [facility] Office Visit from 01/19/2021 in [facility] Office Visit from 10/28/2020 in [facility] from 06/19/2020 in [facility]  PHQ-2 Total Score 6 4 6 1 1   PHQ-9 Total Score 24 19 18 7 8    Flowsheet Row ED from 08/19/2024 in [facility] at [facility] from 06/17/2024 in [facility] Admission (Discharged) from 04/06/2021 in [facility]  C-SSRS RISK CATEGORY High Risk No Risk No Risk     Review of Systems:  Review of Systems  Musculoskeletal:  Negative for gait problem.  Neurological:  Negative for tremors.  Psychiatric/Behavioral:         Please refer to HPI    Medications: I have reviewed the patient's current medications.  Current Outpatient Medications  Medication Sig Dispense Refill   buPROPion  (WELLBUTRIN  XL) 150 MG 24 hr tablet Take 1 tablet (150 mg total) by mouth daily. 30 tablet 2   gabapentin  (NEURONTIN ) 100 MG capsule Take 1 capsule (100 mg total) by mouth 3 (three) times daily. 90 capsule 2   ASHWAGANDHA PO Take 2 capsules by mouth in the morning.     Drospirenone -Estetrol  3-14.2 MG TABS Take 1 tablet by mouth daily. 28 tablet 2   famotidine  (PEPCID ) 40 MG tablet TAKE 1 TABLET BY MOUTH EVERYDAY AT BEDTIME 90 tablet 1   Inositol Niacinate 500 MG TABS Take 500 mg by mouth in  the morning.     lamoTRIgine  (LAMICTAL ) 150 MG tablet TAKE 1 TABLET BY MOUTH EVERYDAY AT BEDTIME 30 tablet 2   Magnesium  400 MG TABS Take 400 mg by mouth every evening.     Moringa Oleifera (MORINGA PO) Take 2 tablets by mouth every evening.     Multiple Vitamins-Minerals (ZINC PO) Take 1 tablet by mouth every evening.     Probiotic Product (DIGESTIVE ADV  PREBIOT+PROBIOT PO) Take 1 capsule by mouth in the morning.     Prucalopride Succinate  (MOTEGRITY ) 2 MG TABS Take 1 tablet (2 mg total) by mouth daily. 90 tablet 3   sertraline  (ZOLOFT ) 100 MG tablet Take 2 tablets (200 mg total) by mouth daily. 60 tablet 2   silver  sulfADIAZINE  (SILVADENE ) 1 % cream Apply to area 3 times daily (Patient taking differently: Apply 1 Application topically 3 (three) times daily as needed (irritation.).) 50 g 11   Simethicone  250 MG CAPS Take 250 mg by mouth in the morning.     No current facility-administered medications for this visit.    Medication Side Effects: None  Allergies: Allergies[1]  Past Medical History:  Diagnosis Date   Anxiety    Anxiety    Bipolar 1 disorder (HCC)    Depression    Family history of adverse reaction to anesthesia    GERD (gastroesophageal reflux disease)    IBS (irritable bowel syndrome)    Migraine without aura     Past Medical History, Surgical history, Social history, and Family history were reviewed and updated as appropriate.   Please see review of systems for further details on the patient's review from today.   Objective:   Physical Exam:  LMP 09/06/2023   Physical Exam Constitutional:      General: She is not in acute distress. Musculoskeletal:        General: No deformity.  Neurological:     Mental Status: She is alert and oriented to person, place, and time.     Coordination: Coordination normal.  Psychiatric:        Attention and Perception: Attention and perception normal. She does not perceive auditory or visual hallucinations.        Mood and Affect: Mood normal. Mood is not anxious or depressed. Affect is not labile, blunt, angry or inappropriate.        Speech: Speech normal.        Behavior: Behavior normal.        Thought Content: Thought content normal. Thought content is not paranoid or delusional. Thought content does not include homicidal or suicidal ideation. Thought content does not  include homicidal or suicidal plan.        Cognition and Memory: Cognition and memory normal.        Judgment: Judgment normal.     Comments: Insight intact     Lab Review:     Component Value Date/Time   NA 144 08/19/2024 1326   NA 137 12/29/2021 0959   K 3.6 08/19/2024 1326   CL 108 08/19/2024 1326   CO2 20 (L) 08/19/2024 1326   GLUCOSE 85 08/19/2024 1326   BUN 13 08/19/2024 1326   BUN 9 12/29/2021 0959   CREATININE 0.75 08/19/2024 1326   CALCIUM 8.5 (L) 08/19/2024 1326   PROT 6.6 08/19/2024 1326   PROT 7.2 12/29/2021 0959   ALBUMIN 4.0 08/19/2024 1326   ALBUMIN 4.9 (H) 12/29/2021 0959   AST 20 08/19/2024 1326   ALT 16 08/19/2024 1326   ALKPHOS 35 (  L) 08/19/2024 1326   BILITOT 0.5 08/19/2024 1326   BILITOT 0.3 12/29/2021 0959   GFRNONAA >60 08/19/2024 1326   GFRAA >60 10/15/2019 0909       Component Value Date/Time   WBC 7.1 08/19/2024 1326   RBC 4.31 08/19/2024 1326   HGB 12.9 08/19/2024 1326   HGB 14.7 12/29/2021 0959   HGB 13.3 09/23/2015 1638   HCT 38.4 08/19/2024 1326   HCT 42.9 12/29/2021 0959   PLT 284 08/19/2024 1326   PLT 260 12/29/2021 0959   MCV 89.1 08/19/2024 1326   MCV 89 12/29/2021 0959   MCH 29.9 08/19/2024 1326   MCHC 33.6 08/19/2024 1326   RDW 12.2 08/19/2024 1326   RDW 12.4 12/29/2021 0959   LYMPHSABS 2.3 02/08/2024 1027   LYMPHSABS 2.0 05/10/2019 1445   MONOABS 0.7 02/08/2024 1027   EOSABS 0.3 02/08/2024 1027   EOSABS 0.1 05/10/2019 1445   BASOSABS 0.1 02/08/2024 1027   BASOSABS 0.0 05/10/2019 1445    No results found for: POCLITH, LITHIUM   No results found for: PHENYTOIN, PHENOBARB, VALPROATE, CBMZ   .res Assessment: Plan:    Plan:  PDMP reviewed  Add Gabapentin  100mg  TID Add Wellbutrin  XL 150mg  every morning - denies seizure history - reports taking in the past and felt it worked well  Zoloft  200mg  daily Lamictal  150mg  daily for depressive symptoms  Will no longer prescribe benzodiazepines and stimulants.   D/C Adderall  10mg  daily  D/C Adderall  XR 30mg  daily D/C Clonazepam  0.5mg  daily to BID prn   RTC 4 weeks  25 minutes spent dedicated to the care of this patient on the date of this encounter to include pre-visit review of records, ordering of medication, post visit documentation, and face-to-face time with the patient discussing ADHD, BPD-1, GAD, CPTSD and obsessional thoughts and acts. Discussed continuing current medication regimen.  Patient advised to contact office with any questions, adverse effects, or acute worsening in signs and symptoms.  Discussed potential benefits, risks, and side effects of stimulants with patient to include increased heart rate, palpitations, insomnia, increased anxiety, increased irritability, or decreased appetite. Instructed patient to contact office if experiencing any significant tolerability issues.  Discussed potential benefits, risk, and side effects of benzodiazepines to include potential risk of tolerance and dependence, as well as possible drowsiness. Advised patient not to drive if experiencing drowsiness and to take lowest possible effective dose to minimize risk of dependence and tolerance.   Counseled patient regarding potential benefits, risks, and side effects of Lamictal  to include potential risk of Stevens-Johnson syndrome. Advised patient to stop taking Lamictal  and contact office immediately if rash develops and to seek urgent medical attention if rash is severe and/or spreading quickly.    Diagnoses and all orders for this visit:  Bipolar I disorder (HCC) -     lamoTRIgine  (LAMICTAL ) 150 MG tablet; TAKE 1 TABLET BY MOUTH EVERYDAY AT BEDTIME -     buPROPion  (WELLBUTRIN  XL) 150 MG 24 hr tablet; Take 1 tablet (150 mg total) by mouth daily. -     gabapentin  (NEURONTIN ) 100 MG capsule; Take 1 capsule (100 mg total) by mouth 3 (three) times daily.  Generalized anxiety disorder -     sertraline  (ZOLOFT ) 100 MG tablet; Take 2 tablets (200 mg  total) by mouth daily.  Attention deficit hyperactivity disorder (ADHD), unspecified ADHD type  Mixed obsessional thoughts and acts  Chronic post-traumatic stress disorder (PTSD)     Please see After Visit Summary for patient specific instructions.  No  future appointments.   No orders of the defined types were placed in this encounter.   -------------------------------     [1]  Allergies Allergen Reactions   Latex Other (See Comments)    General irritation   Doxycycline Rash    Solar rash

## 2024-11-25 ENCOUNTER — Other Ambulatory Visit: Payer: Self-pay | Admitting: Adult Health

## 2024-11-25 DIAGNOSIS — F319 Bipolar disorder, unspecified: Secondary | ICD-10-CM

## 2024-11-29 ENCOUNTER — Telehealth (INDEPENDENT_AMBULATORY_CARE_PROVIDER_SITE_OTHER): Admitting: Adult Health

## 2024-11-29 ENCOUNTER — Encounter: Payer: Self-pay | Admitting: Adult Health

## 2024-11-29 DIAGNOSIS — F319 Bipolar disorder, unspecified: Secondary | ICD-10-CM | POA: Diagnosis not present

## 2024-11-29 DIAGNOSIS — F909 Attention-deficit hyperactivity disorder, unspecified type: Secondary | ICD-10-CM

## 2024-11-29 DIAGNOSIS — F422 Mixed obsessional thoughts and acts: Secondary | ICD-10-CM | POA: Diagnosis not present

## 2024-11-29 DIAGNOSIS — F4312 Post-traumatic stress disorder, chronic: Secondary | ICD-10-CM

## 2024-11-29 DIAGNOSIS — F411 Generalized anxiety disorder: Secondary | ICD-10-CM

## 2024-11-29 NOTE — Progress Notes (Signed)
 Cristina Davenport [id number] Oct 10, 1986 39 y.o.  Virtual Visit via Video Note  I connected with pt @ on 11/29/24 at  9:30 AM EST by a video enabled telemedicine application and verified that I am speaking with the correct person using two identifiers.   I discussed the limitations of evaluation and management by telemedicine and the availability of in person appointments. The patient expressed understanding and agreed to proceed.  I discussed the assessment and treatment plan with the patient. The patient was provided an opportunity to ask questions and all were answered. The patient agreed with the plan and demonstrated an understanding of the instructions.   The patient was advised to call back or seek an in-person evaluation if the symptoms worsen or if the condition fails to improve as anticipated.  I provided 25 minutes of non-face-to-face time during this encounter.  The patient was located at home.  The provider was located at [facility].   Angeline LOISE Sayers, NP   Subjective:   Patient ID:  Cristina Davenport is a 39 y.o. (DOB 09-Nov-1986) female.  Chief Complaint: No chief complaint on file.   HPI Cristina Davenport presents for follow-up of ADHD, BPD1, GAD, and mixed obsessional thoughts and acts.   Describes mood today as ok. Pleasant. Mood symptoms - reports decreased depression - I'm managing it better. Reports some anxiety and irritability - a little bit. Reports improved interest and motivation. Denies panic attacks. Denies worry, rumination and over thinking. Denies obsessive thoughts and acts. Reports mood is calmer. Stating I feel like I'm doing better. Reports taking current medications as prescribed.   Energy levels stable. Active, has a regular exercise routine - Yoga. Enjoys some usual interests and activities. Married. Lives with husband. Has 3 children - dog. Family local. Spending time with family. Appetite adequate. Weight stable - 140 pounds. Reports  sleep has improved. Averages 6 to 8 hours. Reports some daytime napping.  Reports focus and concentration difficulties. Completing tasks. Managing aspects of household.  Denies SI or HI.  Denies AH or VH. Denies self harm. Denies disassociation. Denies paranoid thoughts. Denies substance use.  Previous medication trials: Zoloft , Hydroxyzine , Adderall  XR 20mg  daily, Lamictal    Review of Systems:  Review of Systems  Musculoskeletal:  Negative for gait problem.  Neurological:  Negative for tremors.  Psychiatric/Behavioral:         Please refer to HPI    Medications: I have reviewed the patient's current medications.  Current Outpatient Medications  Medication Sig Dispense Refill   ASHWAGANDHA PO Take 2 capsules by mouth in the morning.     buPROPion  (WELLBUTRIN  XL) 150 MG 24 hr tablet Take 1 tablet (150 mg total) by mouth daily. 30 tablet 2   Drospirenone -Estetrol  3-14.2 MG TABS Take 1 tablet by mouth daily. 28 tablet 2   famotidine  (PEPCID ) 40 MG tablet TAKE 1 TABLET BY MOUTH EVERYDAY AT BEDTIME 90 tablet 1   gabapentin  (NEURONTIN ) 100 MG capsule Take 1 capsule (100 mg total) by mouth 3 (three) times daily. 90 capsule 2   Inositol Niacinate 500 MG TABS Take 500 mg by mouth in the morning.     lamoTRIgine  (LAMICTAL ) 150 MG tablet TAKE 1 TABLET BY MOUTH EVERYDAY AT BEDTIME 30 tablet 2   Magnesium  400 MG TABS Take 400 mg by mouth every evening.     Moringa Oleifera (MORINGA PO) Take 2 tablets by mouth every evening.     Multiple Vitamins-Minerals (ZINC PO) Take 1 tablet by mouth every  evening.     Probiotic Product (DIGESTIVE ADV PREBIOT+PROBIOT PO) Take 1 capsule by mouth in the morning.     Prucalopride Succinate  (MOTEGRITY ) 2 MG TABS Take 1 tablet (2 mg total) by mouth daily. 90 tablet 3   sertraline  (ZOLOFT ) 100 MG tablet Take 2 tablets (200 mg total) by mouth daily. 60 tablet 2   silver  sulfADIAZINE  (SILVADENE ) 1 % cream Apply to area 3 times daily (Patient taking differently:  Apply 1 Application topically 3 (three) times daily as needed (irritation.).) 50 g 11   Simethicone  250 MG CAPS Take 250 mg by mouth in the morning.     No current facility-administered medications for this visit.    Medication Side Effects: None  Allergies: Allergies[1]  Past Medical History:  Diagnosis Date   Anxiety    Anxiety    Bipolar 1 disorder (HCC)    Depression    Family history of adverse reaction to anesthesia    GERD (gastroesophageal reflux disease)    IBS (irritable bowel syndrome)    Migraine without aura     Family History  Adopted: Yes  Problem Relation Age of Onset   Mental illness Mother    Cancer Father        skin cancer   Hypertension Maternal Grandmother    Stroke Maternal Grandmother    Kidney failure Maternal Grandmother    Hypertension Maternal Grandfather    Stroke Maternal Grandfather    Cancer Maternal Grandfather        skin   Crohn's disease Cousin    Colon cancer Neg Hx    Rectal cancer Neg Hx    Stomach cancer Neg Hx     Social History   Socioeconomic History   Marital status: Married    Spouse name: Mariea Mcmartin   Number of children: 3   Years of education: 12   Highest education level: Some college, no degree  Occupational History   Occupation: Magazine Features Editor   Occupation: realtor  Tobacco Use   Smoking status: Former    Current packs/day: 0.00    Average packs/day: 0.5 packs/day for 8.0 years (4.0 ttl pk-yrs)    Types: Cigarettes    Start date: 09/19/2006    Quit date: 09/19/2014    Years since quitting: 10.2   Smokeless tobacco: Never  Vaping Use   Vaping status: Every Day   Substances: Nicotine, Flavoring  Substance and Sexual Activity   Alcohol use: Yes    Alcohol/week: 3.0 standard drinks of alcohol    Types: 3 Glasses of wine per week    Comment: scoially   Drug use: Yes    Types: Marijuana    Comment: for pain in the back   Sexual activity: Yes    Partners: Male    Birth control/protection: Pill   Other Topics Concern   Not on file  Social History Narrative   Married since July 2020,been together for 10 years.Lives with husband and kids.   Works as Customer Service Manager.Husband is copywriter, advertising for [employer].   Social Drivers of Health   Tobacco Use: Medium Risk (11/29/2024)   Patient History    Smoking Tobacco Use: Former    Smokeless Tobacco Use: Never    Passive Exposure: Not on file  Financial Resource Strain: Low Risk (12/29/2021)   Overall Financial Resource Strain (CARDIA)    Difficulty of Paying Living Expenses: Not hard at all  Food Insecurity: No Food Insecurity (12/29/2021)   Hunger Vital Sign  Worried About Programme Researcher, Broadcasting/film/video in the Last Year: Never true    Ran Out of Food in the Last Year: Never true  Transportation Needs: No Transportation Needs (12/29/2021)   PRAPARE - Administrator, Civil Service (Medical): No    Lack of Transportation (Non-Medical): No  Physical Activity: Sufficiently Active (12/29/2021)   Exercise Vital Sign    Days of Exercise per Week: 5 days    Minutes of Exercise per Session: 40 min  Stress: Stress Concern Present (12/29/2021)   [employer] of Occupational Health - Occupational Stress Questionnaire    Feeling of Stress : Very much  Social Connections: Unknown (12/29/2021)   Social Connection and Isolation Panel    Frequency of Communication with Friends and Family: More than three times a week    Frequency of Social Gatherings with Friends and Family: Once a week    Attends Religious Services: Patient declined    Database Administrator or Organizations: No    Attends Engineer, Structural: More than 4 times per year    Marital Status: Married  Catering Manager Violence: Not At Risk (12/29/2021)   Humiliation, Afraid, Rape, and Kick questionnaire    Fear of Current or Ex-Partner: No    Emotionally Abused: No    Physically Abused: No    Sexually Abused: No  Depression (PHQ2-9): High Risk (01/04/2023)   Depression  (PHQ2-9)    PHQ-2 Score: 24  Alcohol Screen: Low Risk (12/29/2021)   Alcohol Screen    Last Alcohol Screening Score (AUDIT): 4  Housing: Low Risk (12/29/2021)   Housing    Last Housing Risk Score: 0  Utilities: Not on file  Health Literacy: Not on file    Past Medical History, Surgical history, Social history, and Family history were reviewed and updated as appropriate.   Please see review of systems for further details on the patient's review from today.   Objective:   Physical Exam:  LMP 09/06/2023   Physical Exam Constitutional:      General: She is not in acute distress. Musculoskeletal:        General: No deformity.  Neurological:     Mental Status: She is alert and oriented to person, place, and time.     Coordination: Coordination normal.  Psychiatric:        Attention and Perception: Attention and perception normal. She does not perceive auditory or visual hallucinations.        Mood and Affect: Mood normal. Mood is not anxious or depressed. Affect is not labile, blunt, angry or inappropriate.        Speech: Speech normal.        Behavior: Behavior normal.        Thought Content: Thought content normal. Thought content is not paranoid or delusional. Thought content does not include homicidal or suicidal ideation. Thought content does not include homicidal or suicidal plan.        Cognition and Memory: Cognition and memory normal.        Judgment: Judgment normal.     Comments: Insight intact     Lab Review:     Component Value Date/Time   NA 144 08/19/2024 1326   NA 137 12/29/2021 0959   K 3.6 08/19/2024 1326   CL 108 08/19/2024 1326   CO2 20 (L) 08/19/2024 1326   GLUCOSE 85 08/19/2024 1326   BUN 13 08/19/2024 1326   BUN 9 12/29/2021 0959   CREATININE 0.75 08/19/2024 1326  CALCIUM 8.5 (L) 08/19/2024 1326   PROT 6.6 08/19/2024 1326   PROT 7.2 12/29/2021 0959   ALBUMIN 4.0 08/19/2024 1326   ALBUMIN 4.9 (H) 12/29/2021 0959   AST 20 08/19/2024 1326   ALT  16 08/19/2024 1326   ALKPHOS 35 (L) 08/19/2024 1326   BILITOT 0.5 08/19/2024 1326   BILITOT 0.3 12/29/2021 0959   GFRNONAA >60 08/19/2024 1326   GFRAA >60 10/15/2019 0909       Component Value Date/Time   WBC 7.1 08/19/2024 1326   RBC 4.31 08/19/2024 1326   HGB 12.9 08/19/2024 1326   HGB 14.7 12/29/2021 0959   HGB 13.3 09/23/2015 1638   HCT 38.4 08/19/2024 1326   HCT 42.9 12/29/2021 0959   PLT 284 08/19/2024 1326   PLT 260 12/29/2021 0959   MCV 89.1 08/19/2024 1326   MCV 89 12/29/2021 0959   MCH 29.9 08/19/2024 1326   MCHC 33.6 08/19/2024 1326   RDW 12.2 08/19/2024 1326   RDW 12.4 12/29/2021 0959   LYMPHSABS 2.3 02/08/2024 1027   LYMPHSABS 2.0 05/10/2019 1445   MONOABS 0.7 02/08/2024 1027   EOSABS 0.3 02/08/2024 1027   EOSABS 0.1 05/10/2019 1445   BASOSABS 0.1 02/08/2024 1027   BASOSABS 0.0 05/10/2019 1445    No results found for: POCLITH, LITHIUM   No results found for: PHENYTOIN, PHENOBARB, VALPROATE, CBMZ   .res Assessment: Plan:    Plan:  PDMP reviewed  Continue: Zoloft  200mg  daily Lamictal  150mg  daily for depressive symptoms Gabapentin  100mg  TID Wellbutrin  XL 150mg  every morning - denies seizure history  Will no longer prescribe benzodiazepines and stimulants.   RTC 4 weeks  25 minutes spent dedicated to the care of this patient on the date of this encounter to include pre-visit review of records, ordering of medication, post visit documentation, and face-to-face time with the patient discussing ADHD, BPD-1, GAD, CPTSD and obsessional thoughts and acts. Discussed continuing current medication regimen.  Patient advised to contact office with any questions, adverse effects, or acute worsening in signs and symptoms.  Discussed potential benefits, risks, and side effects of stimulants with patient to include increased heart rate, palpitations, insomnia, increased anxiety, increased irritability, or decreased appetite. Instructed patient to  contact office if experiencing any significant tolerability issues.  Discussed potential benefits, risk, and side effects of benzodiazepines to include potential risk of tolerance and dependence, as well as possible drowsiness. Advised patient not to drive if experiencing drowsiness and to take lowest possible effective dose to minimize risk of dependence and tolerance.   Counseled patient regarding potential benefits, risks, and side effects of Lamictal  to include potential risk of Stevens-Johnson syndrome. Advised patient to stop taking Lamictal  and contact office immediately if rash develops and to seek urgent medical attention if rash is severe and/or spreading quickly.    Diagnoses and all orders for this visit:  Bipolar I disorder (HCC)  Generalized anxiety disorder  Attention deficit hyperactivity disorder (ADHD), unspecified ADHD type  Mixed obsessional thoughts and acts  Chronic post-traumatic stress disorder (PTSD)     Please see After Visit Summary for patient specific instructions.  No future appointments.   No orders of the defined types were placed in this encounter.     -------------------------------      [1]  Allergies Allergen Reactions   Latex Other (See Comments)    General irritation   Doxycycline Rash    Solar rash
